# Patient Record
Sex: Female | Born: 1960 | Race: Black or African American | Hispanic: No | Marital: Single | Smoking: Never smoker
Health system: Southern US, Community
[De-identification: ages and names within clinical notes are randomized; demographics above are authoritative.]

## PROBLEM LIST (undated history)

## (undated) DIAGNOSIS — R601 Generalized edema: Secondary | ICD-10-CM

## (undated) DIAGNOSIS — D649 Anemia, unspecified: Secondary | ICD-10-CM

## (undated) DIAGNOSIS — I5032 Chronic diastolic (congestive) heart failure: Secondary | ICD-10-CM

## (undated) DIAGNOSIS — E876 Hypokalemia: Secondary | ICD-10-CM

## (undated) DIAGNOSIS — K436 Other and unspecified ventral hernia with obstruction, without gangrene: Secondary | ICD-10-CM

## (undated) DIAGNOSIS — R0600 Dyspnea, unspecified: Secondary | ICD-10-CM

## (undated) DIAGNOSIS — E119 Type 2 diabetes mellitus without complications: Secondary | ICD-10-CM

## (undated) DIAGNOSIS — K219 Gastro-esophageal reflux disease without esophagitis: Secondary | ICD-10-CM

## (undated) DIAGNOSIS — R600 Localized edema: Secondary | ICD-10-CM

## (undated) DIAGNOSIS — N184 Chronic kidney disease, stage 4 (severe): Secondary | ICD-10-CM

## (undated) DIAGNOSIS — J180 Bronchopneumonia, unspecified organism: Secondary | ICD-10-CM

## (undated) DIAGNOSIS — I10 Essential (primary) hypertension: Secondary | ICD-10-CM

## (undated) DIAGNOSIS — J45909 Unspecified asthma, uncomplicated: Secondary | ICD-10-CM

---

## 2001-07-29 ENCOUNTER — Other Ambulatory Visit: Admission: RE | Admit: 2001-07-29 | Discharge: 2001-07-29 | Payer: Self-pay | Admitting: Family Medicine

## 2004-12-06 ENCOUNTER — Ambulatory Visit: Payer: Self-pay

## 2007-12-08 ENCOUNTER — Emergency Department: Payer: Self-pay | Admitting: Internal Medicine

## 2008-11-09 ENCOUNTER — Emergency Department: Payer: Self-pay | Admitting: Emergency Medicine

## 2008-12-07 ENCOUNTER — Emergency Department: Payer: Self-pay | Admitting: Unknown Physician Specialty

## 2009-02-08 ENCOUNTER — Inpatient Hospital Stay: Payer: Self-pay | Admitting: Internal Medicine

## 2009-02-18 ENCOUNTER — Emergency Department: Payer: Self-pay

## 2009-03-27 ENCOUNTER — Emergency Department: Payer: Self-pay | Admitting: Emergency Medicine

## 2009-07-15 ENCOUNTER — Emergency Department: Payer: Self-pay | Admitting: Emergency Medicine

## 2012-02-03 ENCOUNTER — Emergency Department: Payer: Self-pay | Admitting: *Deleted

## 2013-04-22 ENCOUNTER — Emergency Department: Payer: Self-pay | Admitting: Emergency Medicine

## 2013-04-22 LAB — URINALYSIS, COMPLETE
Bilirubin,UR: NEGATIVE
Glucose,UR: >=500 mg/dL (ref 0–75)
Leukocyte Esterase: NEGATIVE
Nitrite: NEGATIVE
Ph: 6 (ref 4.5–8.0)
RBC,UR: 2 /HPF (ref 0–5)
WBC UR: 18 /HPF (ref 0–5)

## 2013-04-22 LAB — CBC
HCT: 44 % (ref 35.0–47.0)
HGB: 14.6 g/dL (ref 12.0–16.0)
MCH: 29.3 pg (ref 26.0–34.0)
MCV: 88 fL (ref 80–100)
Platelet: 210 10*3/uL (ref 150–440)
RBC: 4.98 10*6/uL (ref 3.80–5.20)
WBC: 7.2 10*3/uL (ref 3.6–11.0)

## 2013-04-22 LAB — BASIC METABOLIC PANEL
Anion Gap: 11 (ref 7–16)
Co2: 22 mmol/L (ref 21–32)
Creatinine: 1.08 mg/dL (ref 0.60–1.30)
EGFR (Non-African Amer.): 59 — ABNORMAL LOW
Glucose: 386 mg/dL — ABNORMAL HIGH (ref 65–99)
Potassium: 3.7 mmol/L (ref 3.5–5.1)
Sodium: 133 mmol/L — ABNORMAL LOW (ref 136–145)

## 2013-04-22 LAB — PRO B NATRIURETIC PEPTIDE: B-Type Natriuretic Peptide: 19 pg/mL (ref 0–125)

## 2015-04-15 ENCOUNTER — Other Ambulatory Visit: Payer: Self-pay | Admitting: Oncology

## 2015-04-15 DIAGNOSIS — Z1231 Encounter for screening mammogram for malignant neoplasm of breast: Secondary | ICD-10-CM

## 2015-05-20 ENCOUNTER — Other Ambulatory Visit: Payer: Self-pay | Admitting: Oncology

## 2015-05-20 DIAGNOSIS — Z1231 Encounter for screening mammogram for malignant neoplasm of breast: Secondary | ICD-10-CM

## 2015-05-23 ENCOUNTER — Ambulatory Visit
Admission: RE | Admit: 2015-05-23 | Discharge: 2015-05-23 | Disposition: A | Discharge status: Home / Self Care | Payer: Self-pay | Source: Ambulatory Visit | Attending: Oncology | Admitting: Oncology

## 2015-05-23 ENCOUNTER — Ambulatory Visit: Payer: Self-pay | Attending: Oncology | Admitting: *Deleted

## 2015-05-23 ENCOUNTER — Encounter: Payer: Self-pay | Admitting: *Deleted

## 2015-05-23 VITALS — BP 144/84 | HR 80 | Temp 96.3°F | Ht 61.81 in | Wt 208.3 lb

## 2015-05-23 DIAGNOSIS — Z1231 Encounter for screening mammogram for malignant neoplasm of breast: Secondary | ICD-10-CM | POA: Insufficient documentation

## 2015-05-23 DIAGNOSIS — Z0000 Encounter for general adult medical examination without abnormal findings: Secondary | ICD-10-CM

## 2015-05-23 NOTE — Progress Notes (Signed)
Subjective:     Patient ID: Brianna [name], female   DOB: 08/26/1961, 54 y.o.   MRN: [id number]  HPI   Review of Systems     Objective:   Physical Exam  Pulmonary/Chest: Right breast exhibits no inverted nipple, no mass, no nipple discharge, no skin change and no tenderness. Left breast exhibits no inverted nipple, no mass, no nipple discharge, no skin change and no tenderness. Breasts are symmetrical.         Assessment:    54 year old Black female presents to [facility] for clinical breast exam and mammogram.  Patient is unsure as to the date of her last pap smear.  She thinks it was 2 years ago.  She is to check with her PCP and call me back to schedule an appointment for a pap if it has been 3 or more years since the last one.  Clinical breast exam unremarkable.  Blood pressure elevated at 144/84.  She is to recheck her blood pressure at [facility] or [facility], and if remains higher than 140/90 she is to follow-up with her primary care provider.  Hand out on hypertention given to patient.Patient is eligible for [facility]CP. She has no insurance, Medicare, or Medicaid and meets financial eligibility.  Hand-out given on the afforadable care act.     Plan:     Screening mammogram ordered.

## 2015-05-24 NOTE — Progress Notes (Signed)
Letter mailed from the [facility] to inform patient of her normal mammogram results.  Patient is to follow-up with annual screening in one year. 

## 2015-06-29 ENCOUNTER — Ambulatory Visit: Payer: Self-pay | Attending: Oncology | Admitting: *Deleted

## 2015-06-29 ENCOUNTER — Encounter: Payer: Self-pay | Admitting: *Deleted

## 2015-06-29 VITALS — BP 138/82 | HR 81 | Temp 98.1°F | Resp 20 | Ht <= 58 in | Wt 209.8 lb

## 2015-06-29 DIAGNOSIS — Z0000 Encounter for general adult medical examination without abnormal findings: Secondary | ICD-10-CM

## 2015-06-29 NOTE — Progress Notes (Signed)
Subjective:     Patient ID: Brianna [name], female   DOB: [birth year], 54 y.o.   MRN: [id number]  HPI   Review of Systems     Objective:   Physical Exam  Genitourinary: There is no rash, tenderness, lesion or injury on the right labia. There is no rash, tenderness, lesion or injury on the left labia. No erythema, tenderness or bleeding in the vagina. No foreign body around the vagina. No signs of injury around the vagina. Vaginal discharge found.       Assessment:     Patient returns today for pap smear only. States she does not remember exactly when the last one was, but "I know I need one."  No pap on file with BCCCP.  Specimen collected for pap smear.  Mildly odorous tan vaginal discharge noted.  Informed of her normal mammogram results from a couple of weeks ago.     Plan:     Will follow-up per BCCCP protocol.

## 2015-07-01 LAB — PAP LB AND HPV HIGH-RISK
HPV, HIGH-RISK: NEGATIVE
PAP Smear Comment: 0

## 2015-07-06 ENCOUNTER — Encounter: Payer: Self-pay | Admitting: *Deleted

## 2015-07-06 NOTE — Progress Notes (Signed)
Mailed patient a letter informing her of her normal pap smear and negative HPV.  Next pap due in 5 years per protocol.  [facility] to [place].

## 2016-07-25 ENCOUNTER — Ambulatory Visit
Admission: RE | Admit: 2016-07-25 | Discharge: 2016-07-25 | Disposition: A | Discharge status: Home / Self Care | Payer: Self-pay | Source: Ambulatory Visit | Attending: Internal Medicine | Admitting: Internal Medicine

## 2016-07-25 ENCOUNTER — Ambulatory Visit: Payer: Self-pay | Attending: Internal Medicine

## 2016-07-25 VITALS — BP 121/81 | HR 78 | Temp 98.0°F | Ht <= 58 in | Wt 214.4 lb

## 2016-07-25 DIAGNOSIS — Z0000 Encounter for general adult medical examination without abnormal findings: Secondary | ICD-10-CM

## 2016-07-25 NOTE — Progress Notes (Signed)
Subjective:     Patient ID: Brianna [name], female   DOB: October 08, 1961, 55 y.o.   MRN: [id number]  HPI   Review of Systems     Objective:   Physical Exam  Pulmonary/Chest: Right breast exhibits no inverted nipple, no mass, no nipple discharge, no skin change and no tenderness. Left breast exhibits no inverted nipple, no mass, no nipple discharge, no skin change and no tenderness. Breasts are symmetrical.       Assessment:     55 year old patient presents for [facility] clinic visit.  Patient screened, and meets BCCCP eligibility.  Patient does not have insurance, Medicare or Medicaid.  Handout given on Affordable Care Act.  Instructed patient on breast self-exam using teach back method. CBE unremarkable.  No mass or lump palpated.    Plan:     Sent for bilateral screening mammogram.

## 2016-08-15 NOTE — Progress Notes (Signed)
Letter mailed from [facility] to notify of normal mammogram results.  Patient to return in one year for annual screening.  Copy to [facility]. 

## 2017-07-17 ENCOUNTER — Ambulatory Visit: Payer: Self-pay | Attending: Oncology

## 2017-07-17 ENCOUNTER — Ambulatory Visit
Admission: RE | Admit: 2017-07-17 | Discharge: 2017-07-17 | Disposition: A | Discharge status: Home / Self Care | Payer: Self-pay | Source: Ambulatory Visit | Attending: Oncology | Admitting: Oncology

## 2017-07-17 VITALS — BP 119/77 | HR 83 | Temp 97.9°F | Resp 20 | Ht <= 58 in | Wt 216.0 lb

## 2017-07-17 DIAGNOSIS — Z0000 Encounter for general adult medical examination without abnormal findings: Secondary | ICD-10-CM

## 2017-07-17 NOTE — Progress Notes (Signed)
Subjective:     Patient ID: Brianna [name], female   DOB: June 12, 1961, 56 y.o.   MRN: [id number]  HPI   Review of Systems     Objective:   Physical Exam  Pulmonary/Chest: Right breast exhibits no inverted nipple, no mass, no nipple discharge, no skin change and no tenderness. Left breast exhibits no inverted nipple, no mass, no nipple discharge, no skin change and no tenderness. Breasts are symmetrical.  Large pendulous breasts       Assessment:     56 year old patient presents for [place] clinic visit.  Patient screened, and meets BCCCP eligibility.  Patient does not have insurance, Medicare or Medicaid.  Handout given on Affordable Care Act.   Wardell Honour RN instructed patient on breast self-exam using teach back method. CBE unremarkable.  No mass or lump palpated.    Plan:     Sent for bilateral screening mammogram.

## 2017-08-30 NOTE — Progress Notes (Unsigned)
Letter mailed from [facility] to notify of normal mammogram results.  Patient to return in one year for annual screening.  Copy to [facility]. 

## 2020-02-09 ENCOUNTER — Inpatient Hospital Stay: Payer: Self-pay | Admitting: Certified Registered Nurse Anesthetist

## 2020-02-09 ENCOUNTER — Other Ambulatory Visit: Payer: Self-pay

## 2020-02-09 ENCOUNTER — Inpatient Hospital Stay
Admission: EM | Admit: 2020-02-09 | Discharge: 2020-02-14 | DRG: 329 | Disposition: A | Discharge status: Home / Self Care | Payer: Self-pay | Attending: Surgery | Admitting: Surgery

## 2020-02-09 ENCOUNTER — Inpatient Hospital Stay: Payer: Self-pay

## 2020-02-09 ENCOUNTER — Encounter: Payer: Self-pay | Admitting: Intensive Care

## 2020-02-09 ENCOUNTER — Emergency Department: Payer: Self-pay

## 2020-02-09 ENCOUNTER — Encounter
Admission: EM | Disposition: A | Discharge status: Home / Self Care | Payer: Self-pay | Source: Home / Self Care | Attending: Surgery

## 2020-02-09 DIAGNOSIS — Z79899 Other long term (current) drug therapy: Secondary | ICD-10-CM

## 2020-02-09 DIAGNOSIS — K219 Gastro-esophageal reflux disease without esophagitis: Secondary | ICD-10-CM | POA: Diagnosis present

## 2020-02-09 DIAGNOSIS — Z794 Long term (current) use of insulin: Secondary | ICD-10-CM

## 2020-02-09 DIAGNOSIS — N1832 Chronic kidney disease, stage 3b: Secondary | ICD-10-CM | POA: Diagnosis present

## 2020-02-09 DIAGNOSIS — Z20822 Contact with and (suspected) exposure to covid-19: Secondary | ICD-10-CM | POA: Diagnosis present

## 2020-02-09 DIAGNOSIS — I10 Essential (primary) hypertension: Secondary | ICD-10-CM

## 2020-02-09 DIAGNOSIS — Z7982 Long term (current) use of aspirin: Secondary | ICD-10-CM

## 2020-02-09 DIAGNOSIS — Z6841 Body Mass Index (BMI) 40.0 and over, adult: Secondary | ICD-10-CM

## 2020-02-09 DIAGNOSIS — R0602 Shortness of breath: Secondary | ICD-10-CM

## 2020-02-09 DIAGNOSIS — N170 Acute kidney failure with tubular necrosis: Secondary | ICD-10-CM | POA: Diagnosis present

## 2020-02-09 DIAGNOSIS — K420 Umbilical hernia with obstruction, without gangrene: Secondary | ICD-10-CM

## 2020-02-09 DIAGNOSIS — J45909 Unspecified asthma, uncomplicated: Secondary | ICD-10-CM | POA: Diagnosis present

## 2020-02-09 DIAGNOSIS — K436 Other and unspecified ventral hernia with obstruction, without gangrene: Principal | ICD-10-CM | POA: Diagnosis present

## 2020-02-09 DIAGNOSIS — Z23 Encounter for immunization: Secondary | ICD-10-CM

## 2020-02-09 DIAGNOSIS — I129 Hypertensive chronic kidney disease with stage 1 through stage 4 chronic kidney disease, or unspecified chronic kidney disease: Secondary | ICD-10-CM | POA: Diagnosis present

## 2020-02-09 DIAGNOSIS — N184 Chronic kidney disease, stage 4 (severe): Secondary | ICD-10-CM

## 2020-02-09 DIAGNOSIS — E1122 Type 2 diabetes mellitus with diabetic chronic kidney disease: Secondary | ICD-10-CM | POA: Diagnosis present

## 2020-02-09 DIAGNOSIS — D631 Anemia in chronic kidney disease: Secondary | ICD-10-CM | POA: Diagnosis present

## 2020-02-09 DIAGNOSIS — Z888 Allergy status to other drugs, medicaments and biological substances status: Secondary | ICD-10-CM

## 2020-02-09 DIAGNOSIS — E119 Type 2 diabetes mellitus without complications: Secondary | ICD-10-CM

## 2020-02-09 HISTORY — PX: BOWEL RESECTION: SHX1257

## 2020-02-09 HISTORY — DX: Essential (primary) hypertension: I10

## 2020-02-09 HISTORY — DX: Gastro-esophageal reflux disease without esophagitis: K21.9

## 2020-02-09 HISTORY — DX: Unspecified asthma, uncomplicated: J45.909

## 2020-02-09 HISTORY — DX: Type 2 diabetes mellitus without complications: E11.9

## 2020-02-09 HISTORY — PX: LAPAROTOMY: SHX154

## 2020-02-09 LAB — CBC WITH DIFFERENTIAL/PLATELET
Abs Immature Granulocytes: 0.31 10*3/uL — ABNORMAL HIGH (ref 0.00–0.07)
Basophils Absolute: 0 10*3/uL (ref 0.0–0.1)
Basophils Relative: 0 %
Eosinophils Absolute: 0 10*3/uL (ref 0.0–0.5)
Eosinophils Relative: 0 %
HCT: 41.9 % (ref 36.0–46.0)
Hemoglobin: 13.9 g/dL (ref 12.0–15.0)
Immature Granulocytes: 1 %
Lymphocytes Relative: 10 %
Lymphs Abs: 2.7 10*3/uL (ref 0.7–4.0)
MCH: 29.6 pg (ref 26.0–34.0)
MCHC: 33.2 g/dL (ref 30.0–36.0)
MCV: 89.3 fL (ref 80.0–100.0)
Monocytes Absolute: 2.1 10*3/uL — ABNORMAL HIGH (ref 0.1–1.0)
Monocytes Relative: 8 %
Neutro Abs: 21 10*3/uL — ABNORMAL HIGH (ref 1.7–7.7)
Neutrophils Relative %: 81 %
Platelets: 419 10*3/uL — ABNORMAL HIGH (ref 150–400)
RBC: 4.69 MIL/uL (ref 3.87–5.11)
RDW: 14.6 % (ref 11.5–15.5)
Smear Review: UNDETERMINED
WBC: 26.1 10*3/uL — ABNORMAL HIGH (ref 4.0–10.5)
nRBC: 0 % (ref 0.0–0.2)

## 2020-02-09 LAB — COMPREHENSIVE METABOLIC PANEL
ALT: 17 U/L (ref 0–44)
AST: 19 U/L (ref 15–41)
Albumin: 3.3 g/dL — ABNORMAL LOW (ref 3.5–5.0)
Alkaline Phosphatase: 111 U/L (ref 38–126)
Anion gap: 23 — ABNORMAL HIGH (ref 5–15)
BUN: 74 mg/dL — ABNORMAL HIGH (ref 6–20)
CO2: 24 mmol/L (ref 22–32)
Calcium: 9.9 mg/dL (ref 8.9–10.3)
Chloride: 90 mmol/L — ABNORMAL LOW (ref 98–111)
Creatinine, Ser: 6.44 mg/dL — ABNORMAL HIGH (ref 0.44–1.00)
GFR calc Af Amer: 8 mL/min — ABNORMAL LOW (ref >60)
GFR calc non Af Amer: 7 mL/min — ABNORMAL LOW (ref >60)
Glucose, Bld: 274 mg/dL — ABNORMAL HIGH (ref 70–99)
Potassium: 3.4 mmol/L — ABNORMAL LOW (ref 3.5–5.1)
Sodium: 137 mmol/L (ref 135–145)
Total Bilirubin: 0.9 mg/dL (ref 0.3–1.2)
Total Protein: 9.1 g/dL — ABNORMAL HIGH (ref 6.5–8.1)

## 2020-02-09 LAB — TROPONIN I (HIGH SENSITIVITY): Troponin I (High Sensitivity): 53 ng/L — ABNORMAL HIGH (ref <18)

## 2020-02-09 LAB — HIV ANTIBODY (ROUTINE TESTING W REFLEX): HIV Screen 4th Generation wRfx: NONREACTIVE

## 2020-02-09 LAB — GLUCOSE, CAPILLARY
Glucose-Capillary: 224 mg/dL — ABNORMAL HIGH (ref 70–99)
Glucose-Capillary: 144 mg/dL — ABNORMAL HIGH (ref 70–99)
Glucose-Capillary: 142 mg/dL — ABNORMAL HIGH (ref 70–99)
Glucose-Capillary: 140 mg/dL — ABNORMAL HIGH (ref 70–99)

## 2020-02-09 LAB — LIPASE, BLOOD: Lipase: 19 U/L (ref 11–51)

## 2020-02-09 LAB — RESPIRATORY PANEL BY RT PCR (FLU A&B, COVID)
Influenza A by PCR: NEGATIVE
Influenza B by PCR: NEGATIVE
SARS Coronavirus 2 by RT PCR: NEGATIVE

## 2020-02-09 LAB — LACTIC ACID, PLASMA: Lactic Acid, Venous: 1.7 mmol/L (ref 0.5–1.9)

## 2020-02-09 SURGERY — LAPAROTOMY, EXPLORATORY
Anesthesia: General

## 2020-02-09 MED ORDER — ROCURONIUM BROMIDE 100 MG/10ML IV SOLN
INTRAVENOUS | Status: DC | PRN
  Administered 2020-02-09: 30 mg via INTRAVENOUS
  Administered 2020-02-09: 20 mg via INTRAVENOUS

## 2020-02-09 MED ORDER — MORPHINE SULFATE (PF) 4 MG/ML IV SOLN
4.0000 mg | Freq: Once | INTRAVENOUS | Status: AC
  Administered 2020-02-09: 10:00:00 4 mg via INTRAVENOUS
  Filled 2020-02-09: qty 1

## 2020-02-09 MED ORDER — SODIUM CHLORIDE 0.9 % IV SOLN: INTRAVENOUS | Status: DC | PRN

## 2020-02-09 MED ORDER — METOPROLOL TARTRATE 5 MG/5ML IV SOLN
5.0000 mg | Freq: Four times a day (QID) | INTRAVENOUS | Status: DC
  Administered 2020-02-10 (×3): 5 mg via INTRAVENOUS
  Filled 2020-02-09 (×3): qty 5

## 2020-02-09 MED ORDER — METOPROLOL TARTRATE 5 MG/5ML IV SOLN
INTRAVENOUS | Status: DC | PRN
  Administered 2020-02-09: 3 mg via INTRAVENOUS

## 2020-02-09 MED ORDER — ALBUTEROL SULFATE (2.5 MG/3ML) 0.083% IN NEBU: 2.5000 mg | INHALATION_SOLUTION | RESPIRATORY_TRACT | Status: DC | PRN

## 2020-02-09 MED ORDER — SODIUM CHLORIDE 0.9 % IV SOLN: INTRAVENOUS | Status: DC

## 2020-02-09 MED ORDER — LIDOCAINE IN D5W 4-5 MG/ML-% IV SOLN
INTRAVENOUS | Status: DC | PRN
  Administered 2020-02-09: 25 ug/kg/min via INTRAVENOUS

## 2020-02-09 MED ORDER — INSULIN ASPART 100 UNIT/ML ~~LOC~~ SOLN
10.0000 [IU] | Freq: Once | SUBCUTANEOUS | Status: AC
  Administered 2020-02-09: 10 [IU] via SUBCUTANEOUS

## 2020-02-09 MED ORDER — MIDAZOLAM HCL 2 MG/2ML IJ SOLN
INTRAMUSCULAR | Status: DC | PRN
  Administered 2020-02-09: 1 mg via INTRAVENOUS

## 2020-02-09 MED ORDER — LIDOCAINE HCL (CARDIAC) PF 100 MG/5ML IV SOSY
PREFILLED_SYRINGE | INTRAVENOUS | Status: DC | PRN
  Administered 2020-02-09: 60 mg via INTRAVENOUS

## 2020-02-09 MED ORDER — ROCURONIUM BROMIDE 50 MG/5ML IV SOLN
INTRAVENOUS | Status: AC
  Filled 2020-02-09: qty 1

## 2020-02-09 MED ORDER — SODIUM CHLORIDE 0.9 % IV SOLN
INTRAVENOUS | Status: DC | PRN
  Administered 2020-02-09: 60 mL

## 2020-02-09 MED ORDER — GLYCOPYRROLATE 0.2 MG/ML IJ SOLN
INTRAMUSCULAR | Status: DC | PRN
  Administered 2020-02-09: .6 mg via INTRAVENOUS

## 2020-02-09 MED ORDER — SODIUM CHLORIDE 0.9 % IV SOLN: Freq: Once | INTRAVENOUS | Status: DC

## 2020-02-09 MED ORDER — ONDANSETRON HCL 4 MG/2ML IJ SOLN
INTRAMUSCULAR | Status: DC | PRN
  Administered 2020-02-09: 4 mg via INTRAVENOUS

## 2020-02-09 MED ORDER — ONDANSETRON HCL 4 MG/2ML IJ SOLN: 4.0000 mg | Freq: Four times a day (QID) | INTRAMUSCULAR | Status: DC | PRN

## 2020-02-09 MED ORDER — SODIUM CHLORIDE 0.9 % IV BOLUS: 1000.0000 mL | Freq: Once | INTRAVENOUS | Status: DC

## 2020-02-09 MED ORDER — IPRATROPIUM-ALBUTEROL 0.5-2.5 (3) MG/3ML IN SOLN: 3.0000 mL | Freq: Once | RESPIRATORY_TRACT | Status: AC

## 2020-02-09 MED ORDER — SODIUM CHLORIDE 0.9 % IV BOLUS
1000.0000 mL | Freq: Once | INTRAVENOUS | Status: AC
  Administered 2020-02-09: 1000 mL via INTRAVENOUS

## 2020-02-09 MED ORDER — INFLUENZA VAC SPLIT QUAD 0.5 ML IM SUSY
0.5000 mL | PREFILLED_SYRINGE | INTRAMUSCULAR | Status: AC
  Administered 2020-02-10: 09:00:00 0.5 mL via INTRAMUSCULAR
  Filled 2020-02-09: qty 0.5

## 2020-02-09 MED ORDER — PANTOPRAZOLE SODIUM 40 MG IV SOLR
40.0000 mg | Freq: Every day | INTRAVENOUS | Status: DC
  Administered 2020-02-09 – 2020-02-12 (×4): 40 mg via INTRAVENOUS
  Filled 2020-02-09 (×4): qty 40

## 2020-02-09 MED ORDER — INSULIN ASPART 100 UNIT/ML ~~LOC~~ SOLN: 0.0000 [IU] | Freq: Every day | SUBCUTANEOUS | Status: DC

## 2020-02-09 MED ORDER — ALBUTEROL SULFATE HFA 108 (90 BASE) MCG/ACT IN AERS: 2.0000 | INHALATION_SPRAY | Freq: Four times a day (QID) | RESPIRATORY_TRACT | Status: DC | PRN

## 2020-02-09 MED ORDER — ONDANSETRON HCL 4 MG/2ML IJ SOLN
INTRAMUSCULAR | Status: AC
  Filled 2020-02-09: qty 2

## 2020-02-09 MED ORDER — INSULIN ASPART 100 UNIT/ML ~~LOC~~ SOLN: 6.0000 [IU] | Freq: Three times a day (TID) | SUBCUTANEOUS | Status: DC

## 2020-02-09 MED ORDER — PROPOFOL 10 MG/ML IV BOLUS
INTRAVENOUS | Status: AC
  Filled 2020-02-09: qty 20

## 2020-02-09 MED ORDER — ONDANSETRON 4 MG PO TBDP: 4.0000 mg | ORAL_TABLET | Freq: Four times a day (QID) | ORAL | Status: DC | PRN

## 2020-02-09 MED ORDER — KETAMINE HCL 50 MG/ML IJ SOLN
INTRAMUSCULAR | Status: AC
  Filled 2020-02-09: qty 10

## 2020-02-09 MED ORDER — NEOSTIGMINE METHYLSULFATE 10 MG/10ML IV SOLN
INTRAVENOUS | Status: DC | PRN
  Administered 2020-02-09: 1 mg via INTRAVENOUS
  Administered 2020-02-09: 4 mg via INTRAVENOUS

## 2020-02-09 MED ORDER — HYDROMORPHONE HCL 1 MG/ML IJ SOLN
0.5000 mg | INTRAMUSCULAR | Status: DC | PRN
  Administered 2020-02-09 – 2020-02-13 (×12): 0.5 mg via INTRAVENOUS
  Filled 2020-02-09 (×12): qty 0.5

## 2020-02-09 MED ORDER — METOPROLOL TARTRATE 5 MG/5ML IV SOLN: 5.0000 mg | Freq: Four times a day (QID) | INTRAVENOUS | Status: DC | PRN

## 2020-02-09 MED ORDER — FENTANYL CITRATE (PF) 100 MCG/2ML IJ SOLN
INTRAMUSCULAR | Status: AC
  Filled 2020-02-09: qty 2

## 2020-02-09 MED ORDER — HYDRALAZINE HCL 20 MG/ML IJ SOLN
10.0000 mg | INTRAMUSCULAR | Status: DC | PRN
  Administered 2020-02-13: 06:00:00 10 mg via INTRAVENOUS
  Filled 2020-02-09: qty 1

## 2020-02-09 MED ORDER — KETAMINE HCL 50 MG/ML IJ SOLN
INTRAMUSCULAR | Status: DC | PRN
  Administered 2020-02-09: 10 mg via INTRAMUSCULAR
  Administered 2020-02-09: 50 mg via INTRAMUSCULAR

## 2020-02-09 MED ORDER — PIPERACILLIN-TAZOBACTAM 3.375 G IVPB 30 MIN
3.3750 g | Freq: Once | INTRAVENOUS | Status: AC
  Administered 2020-02-09: 3.375 g via INTRAVENOUS
  Filled 2020-02-09: qty 50

## 2020-02-09 MED ORDER — FENTANYL CITRATE (PF) 100 MCG/2ML IJ SOLN
INTRAMUSCULAR | Status: DC | PRN
  Administered 2020-02-09 (×2): 50 ug via INTRAVENOUS

## 2020-02-09 MED ORDER — ACETAMINOPHEN 10 MG/ML IV SOLN
1000.0000 mg | Freq: Four times a day (QID) | INTRAVENOUS | Status: AC
  Administered 2020-02-09 – 2020-02-10 (×4): 1000 mg via INTRAVENOUS
  Filled 2020-02-09 (×4): qty 100

## 2020-02-09 MED ORDER — INSULIN ASPART 100 UNIT/ML ~~LOC~~ SOLN
0.0000 [IU] | SUBCUTANEOUS | Status: DC
  Administered 2020-02-09 – 2020-02-10 (×3): 3 [IU] via SUBCUTANEOUS
  Filled 2020-02-09 (×3): qty 1

## 2020-02-09 MED ORDER — INSULIN ASPART 100 UNIT/ML ~~LOC~~ SOLN
SUBCUTANEOUS | Status: AC
  Filled 2020-02-09: qty 1

## 2020-02-09 MED ORDER — DEXAMETHASONE SODIUM PHOSPHATE 10 MG/ML IJ SOLN
INTRAMUSCULAR | Status: DC | PRN
  Administered 2020-02-09: 4 mg via INTRAVENOUS

## 2020-02-09 MED ORDER — ONDANSETRON HCL 4 MG/2ML IJ SOLN
4.0000 mg | Freq: Once | INTRAMUSCULAR | Status: AC
  Administered 2020-02-09: 10:00:00 4 mg via INTRAVENOUS
  Filled 2020-02-09: qty 2

## 2020-02-09 MED ORDER — MORPHINE SULFATE (PF) 2 MG/ML IV SOLN: 2.0000 mg | INTRAVENOUS | Status: DC | PRN

## 2020-02-09 MED ORDER — IPRATROPIUM-ALBUTEROL 0.5-2.5 (3) MG/3ML IN SOLN
RESPIRATORY_TRACT | Status: AC
  Administered 2020-02-09: 12:00:00 3 mL via RESPIRATORY_TRACT
  Filled 2020-02-09: qty 3

## 2020-02-09 MED ORDER — SUCCINYLCHOLINE CHLORIDE 20 MG/ML IJ SOLN
INTRAMUSCULAR | Status: DC | PRN
  Administered 2020-02-09: 100 mg via INTRAVENOUS

## 2020-02-09 MED ORDER — ACETAMINOPHEN 10 MG/ML IV SOLN
INTRAVENOUS | Status: DC | PRN
  Administered 2020-02-09 (×2): 1000 mg via INTRAVENOUS

## 2020-02-09 MED ORDER — MIDAZOLAM HCL 2 MG/2ML IJ SOLN
INTRAMUSCULAR | Status: AC
  Filled 2020-02-09: qty 2

## 2020-02-09 MED ORDER — PHENYLEPHRINE HCL (PRESSORS) 10 MG/ML IV SOLN
INTRAVENOUS | Status: DC | PRN
  Administered 2020-02-09: 200 ug via INTRAVENOUS
  Administered 2020-02-09 (×4): 100 ug via INTRAVENOUS

## 2020-02-09 MED ORDER — PIPERACILLIN-TAZOBACTAM 3.375 G IVPB: 3.3750 g | Freq: Three times a day (TID) | INTRAVENOUS | Status: DC

## 2020-02-09 MED ORDER — PROPOFOL 10 MG/ML IV BOLUS
INTRAVENOUS | Status: DC | PRN
  Administered 2020-02-09: 100 mg via INTRAVENOUS

## 2020-02-09 MED ORDER — EPHEDRINE SULFATE 50 MG/ML IJ SOLN
INTRAMUSCULAR | Status: DC | PRN
  Administered 2020-02-09 (×3): 10 mg via INTRAVENOUS

## 2020-02-09 MED ORDER — METOPROLOL TARTRATE 5 MG/5ML IV SOLN
INTRAVENOUS | Status: AC
  Filled 2020-02-09: qty 5

## 2020-02-09 MED ORDER — PIPERACILLIN-TAZOBACTAM 3.375 G IVPB
3.3750 g | Freq: Two times a day (BID) | INTRAVENOUS | Status: DC
  Administered 2020-02-09 – 2020-02-12 (×7): 3.375 g via INTRAVENOUS
  Filled 2020-02-09 (×7): qty 50

## 2020-02-09 MED ORDER — INSULIN ASPART 100 UNIT/ML ~~LOC~~ SOLN: 0.0000 [IU] | Freq: Three times a day (TID) | SUBCUTANEOUS | Status: DC

## 2020-02-09 SURGICAL SUPPLY — 39 items
APPLICATOR CHLORAPREP 10.5 ORG (MISCELLANEOUS) ×4 IMPLANT
CANISTER SUCT 1200ML W/VALVE (MISCELLANEOUS) ×4 IMPLANT
COVER WAND RF STERILE (DRAPES) ×4 IMPLANT
DRAIN CHANNEL JP 19F (MISCELLANEOUS) ×4 IMPLANT
DRAPE LAPAROTOMY 100X77 ABD (DRAPES) ×4 IMPLANT
DRSG OPSITE POSTOP 4X10 (GAUZE/BANDAGES/DRESSINGS) ×4 IMPLANT
DRSG OPSITE POSTOP 4X12 (GAUZE/BANDAGES/DRESSINGS) ×4 IMPLANT
DRSG TEGADERM 4X10 (GAUZE/BANDAGES/DRESSINGS) ×4 IMPLANT
DRSG TEGADERM 4X4.75 (GAUZE/BANDAGES/DRESSINGS) ×4 IMPLANT
ELECT CAUTERY BLADE 6.4 (BLADE) ×4 IMPLANT
ELECT REM PT RETURN 9FT ADLT (ELECTROSURGICAL) ×4
ELECTRODE REM PT RTRN 9FT ADLT (ELECTROSURGICAL) ×2 IMPLANT
GAUZE SPONGE 4X4 12PLY STRL (GAUZE/BANDAGES/DRESSINGS) ×8 IMPLANT
GLOVE SURG SYN 7.0 (GLOVE) ×8 IMPLANT
GLOVE SURG SYN 7.5  E (GLOVE) ×4
GLOVE SURG SYN 7.5 E (GLOVE) ×4 IMPLANT
GOWN STRL REUS W/ TWL LRG LVL3 (GOWN DISPOSABLE) ×8 IMPLANT
GOWN STRL REUS W/TWL LRG LVL3 (GOWN DISPOSABLE) ×8
LABEL OR SOLS (LABEL) ×4 IMPLANT
LIGASURE IMPACT 36 18CM CVD LR (INSTRUMENTS) ×4 IMPLANT
NEEDLE HYPO 22GX1.5 SAFETY (NEEDLE) ×4 IMPLANT
NS IRRIG 1000ML POUR BTL (IV SOLUTION) ×4 IMPLANT
PACK BASIN MAJOR ARMC (MISCELLANEOUS) ×4 IMPLANT
PACK COLON CLEAN CLOSURE (MISCELLANEOUS) IMPLANT
RELOAD PROXIMATE 75MM BLUE (ENDOMECHANICALS) ×12 IMPLANT
SEPRAFILM MEMBRANE 5X6 (MISCELLANEOUS) IMPLANT
STAPLER PROXIMATE 75MM BLUE (STAPLE) ×4 IMPLANT
STAPLER SKIN PROX 35W (STAPLE) ×4 IMPLANT
SUT ETHILON 3-0 FS-10 30 BLK (SUTURE) ×4
SUT PDS AB 1 CT1 36 (SUTURE) ×8 IMPLANT
SUT PROLENE 0 CT 1 30 (SUTURE) IMPLANT
SUT SILK 2 0 (SUTURE) ×2
SUT SILK 2-0 18XBRD TIE 12 (SUTURE) ×2 IMPLANT
SUT SILK 3-0 (SUTURE) ×8 IMPLANT
SUT VIC AB 3-0 SH 27 (SUTURE) ×4
SUT VIC AB 3-0 SH 27X BRD (SUTURE) ×4 IMPLANT
SUTURE EHLN 3-0 FS-10 30 BLK (SUTURE) ×2 IMPLANT
SYR 10ML LL (SYRINGE) ×4 IMPLANT
TRAY FOLEY MTR SLVR 16FR STAT (SET/KITS/TRAYS/PACK) ×4 IMPLANT

## 2020-02-09 NOTE — Op Note (Signed)
Procedure Date:  02/09/2020  Pre-operative Diagnosis:  Strangulated ventral hernia  Post-operative Diagnosis:  Strangulated ventral hernia  Procedure:  Strangulated ventral hernia repair, small bowel resection, debridement and excision of skin and subcutaneous tissue of 60 sqcm.  Surgeon:  Melvyn Neth, MD  Assistants:  Caroleen Hamman, MD.  His assistance was needed due to the complexity of the case, the bowel resection, and hernia repair.  Also assisting was Edison Simon, PA-C  Anesthesia:  General endotracheal  Estimated Blood Loss:  100 ml  Specimens:  Umbilicus skin and subcutaneous tissue, necrotic small bowel segment  Complications:  None  Indications for Procedure:  This is a 59 y.o. female who presents with strangulated ventral hernia causing small bowel obstruction.  The risks of bleeding, abscess or infection, injury to surrounding structures, and need for further procedures were all discussed with the patient and was willing to proceed.  Description of Procedure: The patient was correctly identified in the preoperative area and brought into the operating room.  The patient was placed supine with VTE prophylaxis in place.  Appropriate time-outs were performed.  Anesthesia was induced and the patient was intubated.  Foley catheter was placed.  Appropriate antibiotics were infused.  The abdomen was prepped and draped in a sterile fashion.  A midline incision was made and electrocautery was used to dissect down the subcutaneous tissue to the fascia superior to the ventral hernia.  The fascia was incised and extended superiorly and inferiorly down to the umbilicus.  The patient was noted to have a strangulated ventral hernia involving the umbilicus.  We traced the small bowel to the segment that was strangulated.  A window was created in the mesentery of the small bowel proximal and distal to the hernia defect, and a blue load 75 mm stapler was used to resect the small intestine.   LigaSure was used to take the mesentery along the bowel being resected.  After this was done, then we opened the strangulated hernia defect and reduced the small bowel segment that had been resected.  This was necrotic but had not perforated.  There was a small section of omentum that was also strangulated and this was resected with the LigaSure as well.  After this, we further mobilized the bowel and omentum and noticed a second ventral hernia, which contained omentum.  This was easily reduced and was not necrotic or compromised.  The umbilical skin and subcutaneous tissue were resected, as the umbilical skin had overlying changes and was ischemic.  This was resected down to the fascia.  The fascial defect was then extended inferiorly to better evaluate the entire abdomen.  The small bowel contents were milked back to the stomach, and suctioned via NG tube which location was confirmed by palpation to be in the stomach.  About 2.5 L of fluid was suctioned. After this, the small bowel ends were anastomosed in side to side fashion using the GIA blue load stapler, and the common channel was closed with another blue load.  The intersecting staple corners were imbricated using 3-0 Silk suture, and the mesenteric defect was closed with 3-0 Silk as well.  Following that, no other defects were noted.  The abdominal cavity was irrigated and suctioned.  60 ml of Exparel solution mixed with 0.5% bupivacaine and epi was infiltrated onto the peritoneum, fascia, and subcutaneous tissue.  The fascia was then closed using #1 PDS suture x 2.  The subcutaneous space was irrigated, and a 19 Fr. Blake drain was  placed in the subcutaneous space through a LUQ skin entry point and secured in place with 3-0 Nylon.  The dermis was approximated using 3-0 Vicryl, and the skin was closed using staples.  The wound was dressed with Honeycomb dressing and the drains with 4x4 gauze and tegaderm.   The patient was emerged from anesthesia and  extubated and brought to the recovery room for further management.  The patient tolerated the procedure well and all counts were correct at the end of the case.   Melvyn Neth, MD

## 2020-02-09 NOTE — Consult Note (Signed)
Pharmacy Antibiotic Note  Brianna [name] is a 59 y.o. female admitted on 02/09/2020 with intra-abdominal infection.  Pharmacy has been consulted for Zosyn. dosing.  Plan: Zosyn 3.375g IV q12h (4 hour infusion). - renally adjusted -   Height: 4\' 9"  (144.8 cm) Weight: 216 lb (98 kg) IBW/kg (Calculated) : 38.6  Temp (24hrs), Avg:97.9 F (36.6 C), Min:97.2 F (36.2 C), Max:98.5 F (36.9 C)  Recent Labs  Lab 02/09/20 0914 02/09/20 0931  WBC 26.1*  --   CREATININE 6.44*  --   LATICACIDVEN  --  1.7    Estimated Creatinine Clearance: 9.4 mL/min (A) (by C-G formula based on SCr of 6.44 mg/dL (H)).    Allergies  Allergen Reactions  . Ace Inhibitors Anaphylaxis  . Lisinopril Anaphylaxis  . Lipitor [Atorvastatin] Other (See Comments)    Muscle aches    Antimicrobials this admission: Zosyn 2/9 >>   Dose adjustments this admission: none  Microbiology results: Flu/COVID NEG  Thank you for allowing pharmacy to be a part of this patient's care.  Lu Duffel, PharmD, BCPS Clinical Pharmacist 02/09/2020 3:47 PM

## 2020-02-09 NOTE — Anesthesia Procedure Notes (Signed)
Procedure Name: Intubation Date/Time: 02/09/2020 12:33 PM Performed by: Jonna Clark, CRNA Pre-anesthesia Checklist: Patient identified, Patient being monitored, Timeout performed, Emergency Drugs available and Suction available Patient Re-evaluated:Patient Re-evaluated prior to induction Oxygen Delivery Method: Circle system utilized Preoxygenation: Pre-oxygenation with 100% oxygen Induction Type: IV induction Ventilation: Mask ventilation without difficulty Laryngoscope Size: 3 and McGraph Grade View: Grade I Tube type: Oral Tube size: 7.0 mm Number of attempts: 1 Airway Equipment and Method: Stylet Placement Confirmation: ETT inserted through vocal cords under direct vision,  positive ETCO2 and breath sounds checked- equal and bilateral Secured at: 21 cm Tube secured with: Tape Dental Injury: Teeth and Oropharynx as per pre-operative assessment  Difficulty Due To: Difficulty was unanticipated

## 2020-02-09 NOTE — Transfer of Care (Signed)
Immediate Anesthesia Transfer of Care Note  Patient: Brianna [name]  Procedure(s) Performed: EXPLORATORY LAPAROTOMY (N/A ) SMALL BOWEL RESECTION  Patient Location: PACU  Anesthesia Type:General  Level of Consciousness: drowsy and responds to stimulation  Airway & Oxygen Therapy: Patient Spontanous Breathing and non-rebreather face mask  Post-op Assessment: Report given to RN and Post -op Vital signs reviewed and stable  Post vital signs: Reviewed and stable  Last Vitals:  Vitals Value Taken Time  BP 102/73 02/09/20 1531  Temp    Pulse 80 02/09/20 1532  Resp 18 02/09/20 1532  SpO2 96 % 02/09/20 1532  Vitals shown include unvalidated device data.  Last Pain:  Vitals:   02/09/20 1128  TempSrc: Temporal  PainSc: 6       Patients Stated Pain Goal: 0 ([id number])  Complications: No apparent anesthesia complications

## 2020-02-09 NOTE — Progress Notes (Signed)
Dr. Andree Elk in to evaluate patient, no further orders.  Patient may proceed to floor.

## 2020-02-09 NOTE — Progress Notes (Signed)
15 minute call to floor. 

## 2020-02-09 NOTE — Progress Notes (Signed)
Dr. Andree Elk at bedside, wants chest xray done.  Will continue to monitor patient status.

## 2020-02-09 NOTE — ED Notes (Signed)
Pt aware of need for urine specimen.  Unable to obtain at this time.

## 2020-02-09 NOTE — ED Provider Notes (Signed)
[facility] Emergency Department Provider Note   ____________________________________________   First MD Initiated Contact with Patient 02/09/20 [phone]     (approximate)  I have reviewed the triage vital signs and the nursing notes.   HISTORY  Chief Complaint Shortness of Breath and Constipation    HPI Brianna [name] is a 59 y.o. female with past medical history of hypertension, diabetes, asthma, and GERD who presents to the ED complaining of abdominal pain, constipation, and shortness of breath.  Patient reports that she has not had a bowel movement in the past 4 days, has developed diffuse abdominal pain which is most severe around an umbilical hernia.  She states that she has had the umbilical hernia for a long time, but recently it had been more swollen and she has been unable to reduce it.  Over the past 2 days, she has had persistent nausea and vomiting, states she has been unable to keep anything down.  She is not sure if she has continued to pass gas.  She also endorses some difficulty breathing starting this morning, but denies any fever, cough, or pain in her chest.  She has been using her inhaler at home with some relief.        Past Medical History:  Diagnosis Date   Acid reflux    Asthma    Diabetes mellitus without complication ([place])    Hypertension     Patient Active Problem List   Diagnosis Date Noted   Incarcerated umbilical hernia 0000000    History reviewed. No pertinent surgical history.  Prior to Admission medications   Medication Sig Start Date End Date Taking? Authorizing Provider  albuterol (PROVENTIL) (2.5 MG/3ML) 0.083% nebulizer solution Take 2.5 mg by nebulization every 4 (four) hours as needed for shortness of breath or wheezing. 12/21/19  Yes [provider]  allopurinol (ZYLOPRIM) 300 MG tablet Take 300 mg by mouth daily. 12/21/19  Yes [provider]  amLODipine (NORVASC) 10 MG tablet  Take 10 mg by mouth daily. 12/21/19  Yes [provider]  ASPIRIN ADULT LOW STRENGTH 81 MG EC tablet Take 81 mg by mouth daily. 12/21/19  Yes [provider]  carvedilol (COREG) 6.25 MG tablet Take 6.25 mg by mouth 2 (two) times daily. 12/21/19  Yes [provider]  cetirizine (ZYRTEC) 10 MG tablet Take 10 mg by mouth daily. 09/18/19  Yes [provider]  furosemide (LASIX) 40 MG tablet Take 40 mg by mouth daily. 12/21/19  Yes [provider]  HUMULIN N 100 UNIT/ML injection Inject 25 Units into the skin 2 (two) times daily. 12/21/19  Yes [provider]  hydrochlorothiazide (HYDRODIURIL) 25 MG tablet Take 25 mg by mouth daily. 12/21/19  Yes [provider]  JANUVIA 100 MG tablet Take 100 mg by mouth daily. 12/21/19  Yes [provider]  omeprazole (PRILOSEC) 20 MG capsule Take 20 mg by mouth daily. 12/21/19  Yes [provider]  PROVENTIL HFA 108 (90 Base) MCG/ACT inhaler Inhale 2 puffs into the lungs 4 (four) times daily as needed for shortness of breath or wheezing. 12/21/19  Yes [provider]    Allergies Ace inhibitors, Lisinopril, and Lipitor [atorvastatin]  Family History  Problem Relation Age of Onset   Breast cancer Neg Hx     Social History Social History   Tobacco Use   Smoking status: Never Smoker   Smokeless tobacco: Never Used  Substance Use Topics   Alcohol use: Never  Drug use: Never    Review of Systems  Constitutional: No fever/chills Eyes: No visual changes. ENT: No sore throat. Cardiovascular: Denies chest pain. Respiratory: Positive for shortness of breath. Gastrointestinal: Positive for abdominal pain.  Positive for nausea and vomiting.  No diarrhea.  Positive for constipation. Genitourinary: Negative for dysuria. Musculoskeletal: Negative for back pain. Skin: Negative for rash. Neurological: Negative for headaches, focal weakness or  numbness.  ____________________________________________   PHYSICAL EXAM:  VITAL SIGNS: ED Triage Vitals  Enc Vitals Group     BP 02/09/20 0912 (!) 102/51     Pulse Rate 02/09/20 0912 99     Resp 02/09/20 0912 20     Temp 02/09/20 0910 98.5 F (36.9 C)     Temp Source 02/09/20 0910 Oral     SpO2 02/09/20 0912 95 %     Weight 02/09/20 0911 216 lb (98 kg)     Height 02/09/20 0911 4\' 9"  (1.448 m)     Head Circumference --      Peak Flow --      Pain Score 02/09/20 0910 10     Pain Loc --      Pain Edu? --      Excl. in [employer] [employer]? --     Constitutional: Alert and oriented. Eyes: Conjunctivae are normal. Head: Atraumatic. Nose: No congestion/rhinnorhea. Mouth/Throat: Mucous membranes are moist. Neck: Normal ROM Cardiovascular: Normal rate, regular rhythm. Grossly normal heart sounds. Respiratory: Normal respiratory effort.  No retractions. Lungs CTAB. Gastrointestinal: Ventral and umbilical hernias appreciated with umbilical hernia tender to palpation and not easily reducible, also with some overlying erythema and skin darkening.  Abdomen otherwise diffusely tender to palpation but soft. Genitourinary: deferred Musculoskeletal: No lower extremity tenderness nor edema. Neurologic:  Normal speech and language. No gross focal neurologic deficits are appreciated. Skin:  Skin is warm, dry and intact. No rash noted. Psychiatric: Mood and affect are normal. Speech and behavior are normal.  ____________________________________________   LABS (all labs ordered are listed, but only abnormal results are displayed)  Labs Reviewed  CBC WITH DIFFERENTIAL/PLATELET - Abnormal; Notable for the following components:      Result Value   WBC 26.1 (*)    Platelets 419 (*)    Neutro Abs 21.0 (*)    Monocytes Absolute 2.1 (*)    Abs Immature Granulocytes 0.31 (*)    All other components within normal limits  COMPREHENSIVE METABOLIC PANEL - Abnormal; Notable for the following components:    Potassium 3.4 (*)    Chloride 90 (*)    Glucose, Bld 274 (*)    BUN 74 (*)    Creatinine, Ser 6.44 (*)    Total Protein 9.1 (*)    Albumin 3.3 (*)    GFR calc non Af Amer 7 (*)    GFR calc Af Amer 8 (*)    Anion gap 23 (*)    All other components within normal limits  GLUCOSE, CAPILLARY - Abnormal; Notable for the following components:   Glucose-Capillary 224 (*)    All other components within normal limits  GLUCOSE, CAPILLARY - Abnormal; Notable for the following components:   Glucose-Capillary 144 (*)    All other components within normal limits  TROPONIN I (HIGH SENSITIVITY) - Abnormal; Notable for the following components:   Troponin I (High Sensitivity) 53 (*)    All other components within normal limits  RESPIRATORY PANEL BY RT PCR (FLU A&B, COVID)  LIPASE, BLOOD  LACTIC ACID, PLASMA  LACTIC ACID, PLASMA  URINALYSIS, COMPLETE (UACMP) WITH MICROSCOPIC  HIV ANTIBODY (ROUTINE TESTING W REFLEX)  HEMOGLOBIN A1C  SURGICAL PATHOLOGY  TROPONIN I (HIGH SENSITIVITY)   ____________________________________________  EKG  ED ECG REPORT I, Blake Divine, the attending physician, personally viewed and interpreted this ECG.   Date: 02/09/2020  EKG Time: 9:24  Rate: 96  Rhythm: normal sinus rhythm  Axis: LAD  Intervals:none  ST&T Change: Inferior Q waves   PROCEDURES  Procedure(s) performed (including Critical Care):  .Critical Care Performed by: Blake Divine, MD Authorized by: Blake Divine, MD   Critical care provider statement:    Critical care time (minutes):  45   Critical care time was exclusive of:  Separately billable procedures and treating other patients and teaching time   Critical care was necessary to treat or prevent imminent or life-threatening deterioration of the following conditions:  Sepsis   Critical care was time spent personally by me on the following activities:  Discussions with consultants, evaluation of patient's response to treatment,  examination of patient, ordering and performing treatments and interventions, ordering and review of laboratory studies, ordering and review of radiographic studies, pulse oximetry, re-evaluation of patient's condition, obtaining history from patient or surrogate and review of old charts   I assumed direction of critical care for this patient from another provider in my specialty: no       ____________________________________________   INITIAL IMPRESSION / ASSESSMENT AND PLAN / ED COURSE       59 year old female presents to the ED with 4 days of diffuse abdominal pain along with difficult to reduce umbilical hernia, has also not passed a bowel movement in this time and has had nausea with inability to tolerate p.o.  Abdominal exam concerning for strangulated versus incarcerated hernia as hernia is not easily reducible, is tender to palpation with overlying skin color changes.  Given this, I have asked Dr. Hampton Abbot of surgery to evaluate.  Lab work and CT is pending.  For her shortness of breath, she is not in any respiratory distress, EKG without acute ischemic changes, will check chest x-ray and troponin.  General surgery will plan to take patient to the OR for apparent strangulated hernia.  They state that CT scan is not needed at this time as it will not change management.  Lab work also noted to have significant AKI, however potassium within normal limits, will continue hydration with IV fluids.      ____________________________________________   FINAL CLINICAL IMPRESSION(S) / ED DIAGNOSES  Final diagnoses:  Strangulated umbilical hernia     ED Discharge Orders    None       Note:  This document was prepared using Dragon voice recognition software and may include unintentional dictation errors.   Blake Divine, MD 02/09/20 1610

## 2020-02-09 NOTE — Progress Notes (Signed)
Chest xray completed, patient more alert and awake.  Dr. Hampton Abbot at bedside to talk with and evaluate patient.

## 2020-02-09 NOTE — ED Triage Notes (Addendum)
C/o sob, weakness, constipation X4 days, and unable to eat without vomiting since Friday. Patient also c/o abdominal hernia that is hard to touch.

## 2020-02-09 NOTE — H&P (Signed)
[facility] SURGICAL HISTORY & PHYSICAL (cpt [phone])  HISTORY OF PRESENT ILLNESS (HPI):  59 y.o. female presented to [facility] ED today for abdominal pain. Patient reports that since the weekend she has noticed worsening, and now severe, abdominal pain surrounding her umbilicus at the site of her umbilical hernia. She reports that she has had this hernia "for some time," however more recently it has become more swollen and she has not been able to reduce it. Over the last 4 day she has also noted associated constipation, nausea, and emesis. She is still passing flatus. Unable to tolerate much PO in the last 2 days. No fever, chills, cough, congestion, CP, or SOB. No BM in the last 4 days either. No reported previous abdominal surgeries. Work up in the ED was concerning for likely pre-renal AKI with sCr in 6.44 and BUN of 74, a leukocytosis to 26K, and concerning for incarcerated umbilical hernia with possible small bowel strangulation.   General surgery is consulted by emergency medicine physician Dr Blake Divine, MD for evaluation and management of possible incarcerated umbilical hernia.    PAST MEDICAL HISTORY (PMH):  Past Medical History:  Diagnosis Date  . Acid reflux   . Asthma   . Diabetes mellitus without complication ([place])   . Hypertension     Reviewed. Otherwise negative.   PAST SURGICAL HISTORY ([place]):  History reviewed. No pertinent surgical history.  Reviewed. Otherwise negative.   MEDICATIONS:  Prior to Admission medications   Medication Sig Start Date End Date Taking? Authorizing Provider  albuterol (PROVENTIL) (2.5 MG/3ML) 0.083% nebulizer solution Take 2.5 mg by nebulization every 4 (four) hours as needed for shortness of breath or wheezing. 12/21/19  Yes [provider]  allopurinol (ZYLOPRIM) 300 MG tablet Take 300 mg by mouth daily. 12/21/19  Yes [provider]  amLODipine (NORVASC) 10 MG tablet Take 10 mg by mouth daily. 12/21/19  Yes  [provider]  ASPIRIN ADULT LOW STRENGTH 81 MG EC tablet Take 81 mg by mouth daily. 12/21/19  Yes [provider]  carvedilol (COREG) 6.25 MG tablet Take 6.25 mg by mouth 2 (two) times daily. 12/21/19  Yes [provider]  cetirizine (ZYRTEC) 10 MG tablet Take 10 mg by mouth daily. 09/18/19  Yes [provider]  furosemide (LASIX) 40 MG tablet Take 40 mg by mouth daily. 12/21/19  Yes [provider]  HUMULIN N 100 UNIT/ML injection Inject 25 Units into the skin 2 (two) times daily. 12/21/19  Yes [provider]  hydrochlorothiazide (HYDRODIURIL) 25 MG tablet Take 25 mg by mouth daily. 12/21/19  Yes [provider]  JANUVIA 100 MG tablet Take 100 mg by mouth daily. 12/21/19  Yes [provider]  omeprazole (PRILOSEC) 20 MG capsule Take 20 mg by mouth daily. 12/21/19  Yes [provider]  PROVENTIL HFA 108 (90 Base) MCG/ACT inhaler Inhale 2 puffs into the lungs 4 (four) times daily as needed for shortness of breath or wheezing. 12/21/19  Yes [provider]     ALLERGIES:  Allergies  Allergen Reactions  . Ace Inhibitors Anaphylaxis  . Lisinopril Anaphylaxis  . Lipitor [Atorvastatin] Other (See Comments)    Muscle aches     SOCIAL HISTORY:  Social History   Socioeconomic History  . Marital status: Single    Spouse name: Not on file  . Number of children: Not on file  . Years of education: Not on file  . Highest education level: Not on file  Occupational  History  . Not on file  Tobacco Use  . Smoking status: Never Smoker  . Smokeless tobacco: Never Used  Substance and Sexual Activity  . Alcohol use: Never  . Drug use: Never  . Sexual activity: Not on file  Other Topics Concern  . Not on file  Social History Narrative  . Not on file   Social Determinants of Health   Financial Resource Strain:   . Difficulty of Paying Living Expenses: Not on file  Food Insecurity:   . Worried About  Charity fundraiser in the Last Year: Not on file  . Ran Out of Food in the Last Year: Not on file  Transportation Needs:   . Lack of Transportation (Medical): Not on file  . Lack of Transportation (Non-Medical): Not on file  Physical Activity:   . Days of Exercise per Week: Not on file  . Minutes of Exercise per Session: Not on file  Stress:   . Feeling of Stress : Not on file  Social Connections:   . Frequency of Communication with Friends and Family: Not on file  . Frequency of Social Gatherings with Friends and Family: Not on file  . Attends Religious Services: Not on file  . Active Member of Clubs or Organizations: Not on file  . Attends Archivist Meetings: Not on file  . Marital Status: Not on file  Intimate Partner Violence:   . Fear of Current or Ex-Partner: Not on file  . Emotionally Abused: Not on file  . Physically Abused: Not on file  . Sexually Abused: Not on file     FAMILY HISTORY:  Family History  Problem Relation Age of Onset  . Breast cancer Neg Hx     Otherwise negative.   REVIEW OF SYSTEMS:  Review of Systems  Constitutional: Negative for chills and fever.       + Decreased Appetite   HENT: Negative for congestion and sore throat.   Respiratory: Negative for cough and shortness of breath.   Cardiovascular: Negative for chest pain and palpitations.  Gastrointestinal: Positive for abdominal pain, constipation, nausea and vomiting. Negative for diarrhea.  Genitourinary: Negative for dysuria and urgency.  All other systems reviewed and are negative.   VITAL SIGNS:  Temp:  [98.5 F (36.9 C)] 98.5 F (36.9 C) (02/09 0910) Pulse Rate:  [86-99] 86 (02/09 1030) Resp:  [20-30] 30 (02/09 1030) BP: (102)/(51) 102/51 (02/09 0912) SpO2:  [91 %-95 %] 91 % (02/09 1030) Weight:  [98 kg] 98 kg (02/09 0911)     Height: 4\' 9"  (144.8 cm) Weight: 98 kg BMI (Calculated): 46.73   PHYSICAL EXAM:  Physical Exam Vitals and nursing note reviewed.   Constitutional:      General: She is not in acute distress.    Appearance: She is well-developed. She is obese. She is not ill-appearing.  HENT:     Head: Normocephalic and atraumatic.  Eyes:     Extraocular Movements: Extraocular movements intact.     Pupils: Pupils are equal, round, and reactive to light.  Cardiovascular:     Rate and Rhythm: Normal rate and regular rhythm.     Pulses: Normal pulses.     Heart sounds: No murmur. No friction rub. No gallop.   Pulmonary:     Effort: Pulmonary effort is normal. No respiratory distress.     Breath sounds: Normal breath sounds. No decreased breath sounds, wheezing or rhonchi.  Abdominal:     General: There is no  distension.     Palpations: Abdomen is soft.     Tenderness: There is abdominal tenderness in the periumbilical area.     Hernia: A hernia is present. Hernia is present in the umbilical area.     Comments: She has an obvious large, tense, umbilical hernia which is tender to palpation. There is overlying changes to the skin. The remainder of her abdomen is soft and non-tender  Genitourinary:    Comments: Deferred Musculoskeletal:        General: Normal range of motion.     Right lower leg: No edema.     Left lower leg: No edema.  Skin:    General: Skin is warm and dry.     Coloration: Skin is not pale.     Findings: Erythema (Over umbilicus) present.  Neurological:     General: No focal deficit present.     Mental Status: She is alert and oriented to person, place, and time.  Psychiatric:        Mood and Affect: Mood normal.        Behavior: Behavior normal.     INTAKE/OUTPUT:  This shift: No intake/output data recorded.  Last 2 shifts: @IOLAST2SHIFTS @  Labs:  CBC Latest Ref Rng & Units 02/09/2020 04/22/2013  WBC 4.0 - 10.5 K/uL 26.1(H) 7.2  Hemoglobin 12.0 - 15.0 g/dL 13.9 14.6  Hematocrit 36.0 - 46.0 % 41.9 44.0  Platelets 150 - 400 K/uL 419(H) 210   CMP Latest Ref Rng & Units 02/09/2020 04/22/2013  Glucose 70 -  99 mg/dL 274(H) 386(H)  BUN 6 - 20 mg/dL 74(H) 14  Creatinine 0.44 - 1.00 mg/dL 6.44(H) 1.08  Sodium 135 - 145 mmol/L 137 133(L)  Potassium 3.5 - 5.1 mmol/L 3.4(L) 3.7  Chloride 98 - 111 mmol/L 90(L) 100  CO2 22 - 32 mmol/L 24 22  Calcium 8.9 - 10.3 mg/dL 9.9 9.4  Total Protein 6.5 - 8.1 g/dL 9.1(H) -  Total Bilirubin 0.3 - 1.2 mg/dL 0.9 -  Alkaline Phos 38 - 126 U/L 111 -  AST 15 - 41 U/L 19 -  ALT 0 - 44 U/L 17 -    Assessment/Plan: (ICD-10's: K42.0) 59 y.o. female with examination concerning for incarcerated umbilical hernia and possible bowel strangulation which is complicated by leukocytosis to 25, and severe AKi with sCr of 6.44.   - We will proceed urgently to the OR for exploratory laparotomy and possible small bowel resection with Dr Hampton Abbot pending OR/Anesthesia availability.   - All risks, benefits, and alternatives to above procedure(s) were discussed with the patient, all of her questions were answered to her expressed satisfaction, patient expresses she wishes to proceed, and informed consent was obtained.    - NPO  - Aggressive IVF resuscitation +/- boluses   - IV ABx (Zosyn)  - pain control; antiemetics prn  - Monitor abdominal examination  - trend leukocytosis; renal function; morning labs  - medical management of comorbid conditions; convert to home meds to IV   - DVT prophylaxis; hold for OR  All of the above findings and recommendations were discussed with the patient, and all of her questions were answered to her expressed satisfaction.  -- Edison Simon, PA-C [facility] 02/09/2020, 10:36 AM [phone] M-F: 7am - 4pm

## 2020-02-10 DIAGNOSIS — Z794 Long term (current) use of insulin: Secondary | ICD-10-CM

## 2020-02-10 DIAGNOSIS — N184 Chronic kidney disease, stage 4 (severe): Secondary | ICD-10-CM

## 2020-02-10 DIAGNOSIS — E119 Type 2 diabetes mellitus without complications: Secondary | ICD-10-CM

## 2020-02-10 DIAGNOSIS — N170 Acute kidney failure with tubular necrosis: Secondary | ICD-10-CM

## 2020-02-10 DIAGNOSIS — I10 Essential (primary) hypertension: Secondary | ICD-10-CM

## 2020-02-10 DIAGNOSIS — K436 Other and unspecified ventral hernia with obstruction, without gangrene: Principal | ICD-10-CM

## 2020-02-10 LAB — CBC
HCT: 33.5 % — ABNORMAL LOW (ref 36.0–46.0)
Hemoglobin: 10.9 g/dL — ABNORMAL LOW (ref 12.0–15.0)
MCH: 29.5 pg (ref 26.0–34.0)
MCHC: 32.5 g/dL (ref 30.0–36.0)
MCV: 90.8 fL (ref 80.0–100.0)
Platelets: 304 10*3/uL (ref 150–400)
RBC: 3.69 MIL/uL — ABNORMAL LOW (ref 3.87–5.11)
RDW: 14.2 % (ref 11.5–15.5)
WBC: 12.8 10*3/uL — ABNORMAL HIGH (ref 4.0–10.5)
nRBC: 0 % (ref 0.0–0.2)

## 2020-02-10 LAB — COMPREHENSIVE METABOLIC PANEL
ALT: 15 U/L (ref 0–44)
AST: 21 U/L (ref 15–41)
Albumin: 2.2 g/dL — ABNORMAL LOW (ref 3.5–5.0)
Alkaline Phosphatase: 67 U/L (ref 38–126)
Anion gap: 15 (ref 5–15)
BUN: 79 mg/dL — ABNORMAL HIGH (ref 6–20)
CO2: 23 mmol/L (ref 22–32)
Calcium: 7.8 mg/dL — ABNORMAL LOW (ref 8.9–10.3)
Chloride: 102 mmol/L (ref 98–111)
Creatinine, Ser: 6.92 mg/dL — ABNORMAL HIGH (ref 0.44–1.00)
GFR calc Af Amer: 7 mL/min — ABNORMAL LOW (ref >60)
GFR calc non Af Amer: 6 mL/min — ABNORMAL LOW (ref >60)
Glucose, Bld: 118 mg/dL — ABNORMAL HIGH (ref 70–99)
Potassium: 3.6 mmol/L (ref 3.5–5.1)
Sodium: 140 mmol/L (ref 135–145)
Total Bilirubin: 0.8 mg/dL (ref 0.3–1.2)
Total Protein: 6.1 g/dL — ABNORMAL LOW (ref 6.5–8.1)

## 2020-02-10 LAB — PHOSPHORUS: Phosphorus: 9.3 mg/dL — ABNORMAL HIGH (ref 2.5–4.6)

## 2020-02-10 LAB — GLUCOSE, CAPILLARY
Glucose-Capillary: 100 mg/dL — ABNORMAL HIGH (ref 70–99)
Glucose-Capillary: 103 mg/dL — ABNORMAL HIGH (ref 70–99)
Glucose-Capillary: 104 mg/dL — ABNORMAL HIGH (ref 70–99)
Glucose-Capillary: 106 mg/dL — ABNORMAL HIGH (ref 70–99)
Glucose-Capillary: 118 mg/dL — ABNORMAL HIGH (ref 70–99)
Glucose-Capillary: 139 mg/dL — ABNORMAL HIGH (ref 70–99)

## 2020-02-10 LAB — HEMOGLOBIN A1C
Hgb A1c MFr Bld: 7.2 % — ABNORMAL HIGH (ref 4.8–5.6)
Mean Plasma Glucose: 160 mg/dL

## 2020-02-10 LAB — MAGNESIUM: Magnesium: 2.1 mg/dL (ref 1.7–2.4)

## 2020-02-10 MED ORDER — CHLORHEXIDINE GLUCONATE CLOTH 2 % EX PADS
6.0000 | MEDICATED_PAD | Freq: Every day | CUTANEOUS | Status: DC
  Administered 2020-02-10 – 2020-02-14 (×5): 6 via TOPICAL

## 2020-02-10 MED ORDER — METOPROLOL TARTRATE 5 MG/5ML IV SOLN: 5.0000 mg | Freq: Four times a day (QID) | INTRAVENOUS | Status: DC | PRN

## 2020-02-10 MED ORDER — INSULIN ASPART 100 UNIT/ML ~~LOC~~ SOLN
0.0000 [IU] | Freq: Three times a day (TID) | SUBCUTANEOUS | Status: DC
  Administered 2020-02-11 – 2020-02-12 (×3): 1 [IU] via SUBCUTANEOUS
  Administered 2020-02-12 – 2020-02-13 (×3): 2 [IU] via SUBCUTANEOUS
  Administered 2020-02-13: 08:00:00 1 [IU] via SUBCUTANEOUS
  Administered 2020-02-14: 12:00:00 2 [IU] via SUBCUTANEOUS
  Administered 2020-02-14: 09:00:00 1 [IU] via SUBCUTANEOUS
  Filled 2020-02-10 (×9): qty 1

## 2020-02-10 MED ORDER — INSULIN ASPART 100 UNIT/ML ~~LOC~~ SOLN: 0.0000 [IU] | Freq: Every day | SUBCUTANEOUS | Status: DC

## 2020-02-10 MED ORDER — HEPARIN SODIUM (PORCINE) 5000 UNIT/ML IJ SOLN
5000.0000 [IU] | Freq: Three times a day (TID) | INTRAMUSCULAR | Status: DC
  Administered 2020-02-10 – 2020-02-14 (×11): 5000 [IU] via SUBCUTANEOUS
  Filled 2020-02-10 (×11): qty 1

## 2020-02-10 NOTE — Consult Note (Signed)
[address] [place], [place] [address] Phone [phone]. Fax [phone]  Date: 02/10/2020                  Patient Name:  Brianna [name]  MRN: [id number]  DOB: June 09, 1961  Age / Sex: 59 y.o., female         PCP: Donnie Coffin, MD                 Service Requesting Consult: IM/ Olean Ree, MD                 Reason for Consult: ARF            History of Present Illness: Patient is a 59 y.o. female with medical problems of diabetes for 15 to 20 years, poorly controlled, asthma, GERD, who was admitted to [facility] on 02/09/2020 for evaluation of abdominal pain, nausea, vomiting, shortness of breath.  Diagnosed with strangulated hernia and is status post ventral hernia repair and small bowel resection done on February 09, 2020.  Serum creatinine is noted to be elevated and therefore nephrology consult was requested for evaluation Patient was seen with PA student Ms. Clemetine Marker.  I agree with her note with additions as follows    Medications: Outpatient medications: Medications Prior to Admission  Medication Sig Dispense Refill Last Dose  . albuterol (PROVENTIL) (2.5 MG/3ML) 0.083% nebulizer solution Take 2.5 mg by nebulization every 4 (four) hours as needed for shortness of breath or wheezing.   Unknown at PRN  . allopurinol (ZYLOPRIM) 300 MG tablet Take 300 mg by mouth daily.   72+ hours at Unknown  . amLODipine (NORVASC) 10 MG tablet Take 10 mg by mouth daily.   72+ hours at Unknown  . ASPIRIN ADULT LOW STRENGTH 81 MG EC tablet Take 81 mg by mouth daily.   72+ hours at Unknown  . carvedilol (COREG) 6.25 MG tablet Take 6.25 mg by mouth 2 (two) times daily.   72+ hours at Unknown  . cetirizine (ZYRTEC) 10 MG tablet Take 10 mg by mouth daily.   72+ hours at Unknown  . furosemide (LASIX) 40 MG tablet Take 40 mg by mouth daily.   72+ hours at Unknown  . HUMULIN N 100 UNIT/ML injection Inject 25 Units into the skin 2 (two) times daily.   72+ hours at Unknown  .  hydrochlorothiazide (HYDRODIURIL) 25 MG tablet Take 25 mg by mouth daily.   72+ hours at Unknown  . JANUVIA 100 MG tablet Take 100 mg by mouth daily.   72+ hours at Unknown  . omeprazole (PRILOSEC) 20 MG capsule Take 20 mg by mouth daily.   72+ hours at Unknown  . PROVENTIL HFA 108 (90 Base) MCG/ACT inhaler Inhale 2 puffs into the lungs 4 (four) times daily as needed for shortness of breath or wheezing.   Unknown at PRN    Current medications: Current Facility-Administered Medications  Medication Dose Route Frequency Provider Last Rate Last Admin  . 0.9 %  sodium chloride infusion   Intravenous Continuous Piscoya, Jose, MD 100 mL/hr at 02/10/20 1500 Rate Verify at 02/10/20 1500  . albuterol (PROVENTIL) (2.5 MG/3ML) 0.083% nebulizer solution 2.5 mg  2.5 mg Nebulization Q4H PRN Piscoya, Jose, MD      . Chlorhexidine Gluconate Cloth 2 % PADS 6 each  6 each Topical Daily Piscoya, Jose, MD   6 each at 02/10/20 1000  . hydrALAZINE (APRESOLINE) injection 10 mg  10 mg Intravenous Q2H PRN Piscoya,  Jose, MD      . HYDROmorphone (DILAUDID) injection 0.5 mg  0.5 mg Intravenous Q4H PRN Piscoya, Jose, MD   0.5 mg at 02/10/20 1433  . insulin aspart (novoLOG) injection 0-20 Units  0-20 Units Subcutaneous Q4H Olean Ree, MD   3 Units at 02/10/20 0016  . metoprolol tartrate (LOPRESSOR) injection 5 mg  5 mg Intravenous Q6H Piscoya, Jose, MD   5 mg at 02/10/20 1212  . ondansetron (ZOFRAN-ODT) disintegrating tablet 4 mg  4 mg Oral Q6H PRN Piscoya, Jose, MD       Or  . ondansetron (ZOFRAN) injection 4 mg  4 mg Intravenous Q6H PRN Piscoya, Jose, MD      . pantoprazole (PROTONIX) injection 40 mg  40 mg Intravenous QHS Piscoya, Jose, MD   40 mg at 02/09/20 2215  . piperacillin-tazobactam (ZOSYN) IVPB 3.375 g  3.375 g Intravenous Q12H Olean Ree, MD   Stopped at 02/10/20 1321      Allergies: Allergies  Allergen Reactions  . Ace Inhibitors Anaphylaxis  . Lisinopril Anaphylaxis  . Lipitor [Atorvastatin] Other  (See Comments)    Muscle aches      Past Medical History: Past Medical History:  Diagnosis Date  . Acid reflux   . Asthma   . Diabetes mellitus without complication ([place])   . Hypertension      Past Surgical History: Past Surgical History:  Procedure Laterality Date  . BOWEL RESECTION  02/09/2020   Procedure: SMALL BOWEL RESECTION;  Surgeon: Olean Ree, MD;  Location: [facility] ORS;  Service: General;;  . LAPAROTOMY N/A 02/09/2020   Procedure: EXPLORATORY LAPAROTOMY;  Surgeon: Olean Ree, MD;  Location: [facility] ORS;  Service: General;  Laterality: N/A;     Family History: Family History  Problem Relation Age of Onset  . Breast cancer Neg Hx      Social History: Social History   Socioeconomic History  . Marital status: Single    Spouse name: Not on file  . Number of children: Not on file  . Years of education: Not on file  . Highest education level: Not on file  Occupational History  . Not on file  Tobacco Use  . Smoking status: Never Smoker  . Smokeless tobacco: Never Used  Substance and Sexual Activity  . Alcohol use: Never  . Drug use: Never  . Sexual activity: Not Currently  Other Topics Concern  . Not on file  Social History Narrative  . Not on file   Social Determinants of Health   Financial Resource Strain:   . Difficulty of Paying Living Expenses: Not on file  Food Insecurity:   . Worried About Charity fundraiser in the Last Year: Not on file  . Ran Out of Food in the Last Year: Not on file  Transportation Needs:   . Lack of Transportation (Medical): Not on file  . Lack of Transportation (Non-Medical): Not on file  Physical Activity:   . Days of Exercise per Week: Not on file  . Minutes of Exercise per Session: Not on file  Stress:   . Feeling of Stress : Not on file  Social Connections:   . Frequency of Communication with Friends and Family: Not on file  . Frequency of Social Gatherings with Friends and Family: Not on file  . Attends  Religious Services: Not on file  . Active Member of Clubs or Organizations: Not on file  . Attends Archivist Meetings: Not on file  . Marital Status: Not  on file  Intimate Partner Violence:   . Fear of Current or Ex-Partner: Not on file  . Emotionally Abused: Not on file  . Physically Abused: Not on file  . Sexually Abused: Not on file     Review of Systems: Gen: Denies fevers or chills HEENT: No vision or hearing complaints CV: No chest pain or shortness of breath.  Denies previous cardiovascular events Resp: No shortness of breath, cough GI: Prior to admission, appetite was good.  Currently has NG tube GU : Denies any previous renal problems.   MS: States she is able to ambulate at home Derm:    No complaint Psych: No complaints Heme: No complaints Neuro: No complaints Endocrine.  No complaints  Vital Signs: Blood pressure 130/76, pulse 80, temperature 98.4 F (36.9 C), temperature source Oral, resp. rate 20, height 4\' 9"  (1.448 m), weight 98 kg, SpO2 93 %.   Intake/Output Summary (Last 24 hours) at 02/10/2020 1541 Last data filed at 02/10/2020 1500 Gross per 24 hour  Intake 2198.08 ml  Output 270 ml  Net 1928.08 ml    Weight trends: [employer]   02/09/20 0911 02/09/20 1128  Weight: 98 kg 98 kg    Physical Exam: General:  No acute distress, sitting up in the recliner chair  HEENT  anicteric, moist oral mucous membranes, NG tube in place  Neck:  Supple, no masses  Lungs:  Mild basilar rhonchi, otherwise clear  Heart::  No rub or gallop  Abdomen:  Soft, nontender, midline incision, and drain in place  Extremities:  No edema  Neurologic:  Alert, able to answer questions  Skin:  Warm, dry  Foley in place with sediment  Lab results: Basic Metabolic Panel: Recent Labs  Lab 02/09/20 0914 02/10/20 0556  NA 137 140  K 3.4* 3.6  CL 90* 102  CO2 24 23  GLUCOSE 274* 118*  BUN 74* 79*  CREATININE 6.44* 6.92*  CALCIUM 9.9 7.8*  MG  --  2.1  PHOS   --  9.3*    Liver Function Tests: Recent Labs  Lab 02/10/20 0556  AST 21  ALT 15  ALKPHOS 67  BILITOT 0.8  PROT 6.1*  ALBUMIN 2.2*   Recent Labs  Lab 02/09/20 0931  LIPASE 19   No results for input(s): AMMONIA in the last 168 hours.  CBC: Recent Labs  Lab 02/09/20 0914 02/10/20 0556  WBC 26.1* 12.8*  NEUTROABS 21.0*  --   HGB 13.9 10.9*  HCT 41.9 33.5*  MCV 89.3 90.8  PLT 419* 304    Cardiac Enzymes: No results for input(s): CKTOTAL, TROPONINI in the last 168 hours.  BNP: Invalid input(s): POCBNP  CBG: Recent Labs  Lab 02/09/20 2010 02/09/20 2359 02/10/20 0511 02/10/20 0818 02/10/20 1158  GLUCAP 140* 139* 118* 103* 106*    Microbiology: Recent Results (from the past 720 hour(s))  Respiratory Panel by RT PCR (Flu A&B, Covid) - Nasopharyngeal Swab     Status: None   Collection Time: 02/09/20  9:55 AM   Specimen: Nasopharyngeal Swab  Result Value Ref Range Status   SARS Coronavirus 2 by RT PCR NEGATIVE NEGATIVE Final    Comment: (NOTE) SARS-CoV-2 target nucleic acids are NOT DETECTED. The SARS-CoV-2 RNA is generally detectable in upper respiratoy specimens during the acute phase of infection. The lowest concentration of SARS-CoV-2 viral copies this assay can detect is 131 copies/mL. A negative result does not preclude SARS-Cov-2 infection and should not be used as the sole basis for  treatment or other patient management decisions. A negative result may occur with  improper specimen collection/handling, submission of specimen other than nasopharyngeal swab, presence of viral mutation(s) within the areas targeted by this assay, and inadequate number of viral copies (<131 copies/mL). A negative result must be combined with clinical observations, patient history, and epidemiological information. The expected result is Negative. Fact Sheet for Patients:  PinkCheek.be Fact Sheet for Healthcare Providers:   GravelBags.it This test is not yet ap proved or cleared by the [place] FDA and  has been authorized for detection and/or diagnosis of SARS-CoV-2 by FDA under an Emergency Use Authorization (EUA). This EUA will remain  in effect (meaning this test can be used) for the duration of the COVID-19 declaration under Section 564(b)(1) of the Act, 21 U.S.C. section 360bbb-3(b)(1), unless the authorization is terminated or revoked sooner.    Influenza A by PCR NEGATIVE NEGATIVE Final   Influenza B by PCR NEGATIVE NEGATIVE Final    Comment: (NOTE) The Xpert Xpress SARS-CoV-2/FLU/RSV assay is intended as an aid in  the diagnosis of influenza from Nasopharyngeal swab specimens and  should not be used as a sole basis for treatment. Nasal washings and  aspirates are unacceptable for Xpert Xpress SARS-CoV-2/FLU/RSV  testing. Fact Sheet for Patients: PinkCheek.be Fact Sheet for Healthcare Providers: GravelBags.it This test is not yet approved or cleared by the [place] FDA and  has been authorized for detection and/or diagnosis of SARS-CoV-2 by  FDA under an Emergency Use Authorization (EUA). This EUA will remain  in effect (meaning this test can be used) for the duration of the  Covid-19 declaration under Section 564(b)(1) of the Act, 21  U.S.C. section 360bbb-3(b)(1), unless the authorization is  terminated or revoked. Performed at [facility], [address] [place], [place] [address]      Coagulation Studies: No results for input(s): LABPROT, INR in the last 72 hours.  Urinalysis: No results for input(s): COLORURINE, LABSPEC, PHURINE, GLUCOSEU, HGBUR, BILIRUBINUR, KETONESUR, PROTEINUR, UROBILINOGEN, NITRITE, LEUKOCYTESUR in the last 72 hours.  Invalid input(s): APPERANCEUR      Imaging: X-ray chest PA or AP  Result Date: 02/09/2020 CLINICAL DATA:  Increasing shortness of  breath EXAM: CHEST  1 VIEW COMPARISON:  02/09/2020 FINDINGS: Cardiac shadow is mildly prominent but stable from the prior exam. Aortic calcifications are seen. Gastric catheter extends into the stomach. Mild bibasilar atelectatic changes are seen. The overall inspiratory effort is poor. IMPRESSION: Mild bibasilar atelectasis. Electronically Signed   By: Inez Catalina M.D.   On: 02/09/2020 16:21   DG Chest Port 1 View  Result Date: 02/09/2020 CLINICAL DATA:  Shortness of breath, weakness and constipation for the past 4 days. Nausea and vomiting. Abdominal hernia which is firm to palpation. EXAM: PORTABLE CHEST 1 VIEW COMPARISON:  04/22/2013 FINDINGS: Poor inspiration. Interval borderline enlarged cardiac silhouette. Interval mild linear density at both lung bases. Otherwise, clear lungs. Aortic arch calcifications. Thoracic spine and bilateral shoulder degenerative changes. IMPRESSION: 1. Interval borderline cardiomegaly and mild bibasilar linear atelectasis or scarring. 2. Aortic atherosclerosis. Electronically Signed   By: Claudie Revering M.D.   On: 02/09/2020 09:55      Assessment & Plan: Pt is a 59 y.o. African-American  female with diabetes for 15 to 20 years, hypertension, asthma, was admitted on 02/09/2020 with Incarcerated umbilical hernia 0000000 SOB (shortness of breath) [R06.02]  #Acute kidney injury No recent baseline creatinine is available.  Patient states she is followed at [facility].  We  will try to obtain labs from there. Last known creatinine in 2014 was 1.08, GFR greater than 60 Admission creatinine of 6.44, GFR 8 Today has worsened to 6.92 with BUN 79 Phosphorus is elevated at 9.3 and albumin is low at 2.2 No renal imaging is available No urinalysis is available Previously patient had glucosuria and proteinuria in 2014  Plan to obtain urinalysis, urine protein to creatinine ratio We will obtain renal imaging once abdominal pain from surgery is well  controlled  #Diabetes type 2 with CKD Patient has unspecified chronic kidney disease Obtain baseline creatinine from outpatient records Lab Results  Component Value Date   HGBA1C 7.2 (H) 02/09/2020       LOS: 1 Traxton Kolenda 2/10/20213:41 PM    Note: This note was prepared with Dragon dictation. Any transcription errors are unintentional

## 2020-02-10 NOTE — Progress Notes (Signed)
[facility] Day(s): 1.   Post op day(s): 1 Day Post-Op.   Interval History:  Patient seen and examined no acute events or new complaints overnight.  Patient reports she is feeling good this morning.  She has expected post-surgical soreness at incision No fever, chills, nausea, or emesis sCr worsened to 6.92, BUN worsened to 79, U/O - 220 Leukocytosis significantly improved to 12K (from 26k) Severe hyperphosphatemia to 9.6 NGT with 50 ccs out (after 2.5L intra-operatively); no bowel function reported Surgical drain with 20 ccs out   Vital signs in last 24 hours: [min-max] current  Temp:  [97.2 F (36.2 C)-98.5 F (36.9 C)] 98.5 F (36.9 C) (02/10 0435) Pulse Rate:  [70-99] 80 (02/10 0435) Resp:  [16-30] 16 (02/10 0435) BP: (93-145)/(51-97) 131/74 (02/10 0435) SpO2:  [90 %-96 %] 93 % (02/10 0435) Weight:  [98 kg] 98 kg (02/09 1128)     Height: 4\' 9"  (144.8 cm) Weight: 98 kg BMI (Calculated): 46.73   Intake/Output last 2 shifts:  02/09 0701 - 02/10 0700 In: 3573.5 [I.V.:3108; IV Piggyback:465.6] Out: 2690 [Urine:220; Emesis/NG output:50; Drains:20; Blood:100]   Physical Exam:  Constitutional: alert, cooperative and no distress  HEENT: NGT in place Respiratory: breathing non-labored at rest  Cardiovascular: regular rate and sinus rhythm  Gastrointestinal: soft, incisional soreness, and non-distended. No rebound/guarding. Surgical drain to the left of midline with serosanguinous output  Integumentary: Laparotomy incision is CDI with honeycomb in place, no erythema or drainage appreciable    Labs:  CBC Latest Ref Rng & Units 02/10/2020 02/09/2020 04/22/2013  WBC 4.0 - 10.5 K/uL 12.8(H) 26.1(H) 7.2  Hemoglobin 12.0 - 15.0 g/dL 10.9(L) 13.9 14.6  Hematocrit 36.0 - 46.0 % 33.5(L) 41.9 44.0  Platelets 150 - 400 K/uL 304 419(H) 210   CMP Latest Ref Rng & Units 02/10/2020 02/09/2020 04/22/2013  Glucose 70 - 99 mg/dL 118(H) 274(H)  386(H)  BUN 6 - 20 mg/dL 79(H) 74(H) 14  Creatinine 0.44 - 1.00 mg/dL 6.92(H) 6.44(H) 1.08  Sodium 135 - 145 mmol/L 140 137 133(L)  Potassium 3.5 - 5.1 mmol/L 3.6 3.4(L) 3.7  Chloride 98 - 111 mmol/L 102 90(L) 100  CO2 22 - 32 mmol/L 23 24 22   Calcium 8.9 - 10.3 mg/dL 7.8(L) 9.9 9.4  Total Protein 6.5 - 8.1 g/dL 6.1(L) 9.1(H) -  Total Bilirubin 0.3 - 1.2 mg/dL 0.8 0.9 -  Alkaline Phos 38 - 126 U/L 67 111 -  AST 15 - 41 U/L 21 19 -  ALT 0 - 44 U/L 15 17 -     Imaging studies: No new pertinent imaging studies   Assessment/Plan:  59 y.o. female with worsening, likely prerenal AKI but otherwise improved leukocytosis 1 Day Post-Op s/p exploratory laparotomy, strangulated ventral hernia repair, small bowel resection, and excision of subcutaneous tissue for strangulated ventral hernia.   - Continue NPO + IVF resuscitation   - Continue NGT decompression; monitor and record output   - Continue IV ABx (Zosyn)  - Pain control prn; antiemetics prn  - Monitor abdominal examination; on-going bowel function   - Continue foley; trend renal function; monitor U/O; will consult nephrology this morning  - Trend leukocytosis; electrolytes  - Mobilization encouraged  - Medical management of comorbidities; will consult hospitalist service for assistance   All of the above findings and recommendations were discussed with the patient, and the medical team, and all of patient's questions were answered to her expressed satisfaction.  -- Edison Simon, PA-C   [facility] 02/10/2020, 7:46 AM [phone] M-F: 7am - 4pm

## 2020-02-10 NOTE — Consult Note (Signed)
[address] [place], [place] [address] Phone [phone]. Fax [phone]  Date: 02/10/2020                  Patient Name:  Brianna [name]  MRN: [id number]  DOB: 1961/02/03  Age / Sex: 59 y.o., female         PCP: Donnie Coffin, MD                 Service Requesting Consult: Surgery / Olean Ree, MD                 Reason for Consult: Acute Kidney Injury            History of Present Illness: Patient is a 59 y.o. female with medical problems of hypertension, diabetes Type II, asthma, GERD who was admitted to [facility] on 02/09/2020 for evaluation of abdominal pain with persistent nausea/vomiting, constipation, and shortness of breath. She is admitted for strangulated hernia s/p strangulated ventral hernia repair, small bowel resection, debridement and excision of skin and subcutaneous tissue on 02/09/20. Today she is feeling much better, reporting 5/10 abdominal soreness which was 10/10 pain prior to surgery.  Prior to admission she was unable to tolerate any PO intake including liquids for about 3 days. Reports she noticed decreased urine output as well.  Chart review reveals that the patient has been diagnosed with CKD stage III and was referred to nephrology both in April 2019 and July 2020. However the patient denies history of kidney disease and states she has never seen a nephrologist. She denies any swelling prior to arrival. Denies hematuria. She was on both HCTZ and Lasix at home.   Medications: Outpatient medications: Medications Prior to Admission  Medication Sig Dispense Refill Last Dose   albuterol (PROVENTIL) (2.5 MG/3ML) 0.083% nebulizer solution Take 2.5 mg by nebulization every 4 (four) hours as needed for shortness of breath or wheezing.   Unknown at PRN   allopurinol (ZYLOPRIM) 300 MG tablet Take 300 mg by mouth daily.   72+ hours at Unknown   amLODipine (NORVASC) 10 MG tablet Take 10 mg by mouth daily.   72+ hours at Unknown   ASPIRIN ADULT LOW  STRENGTH 81 MG EC tablet Take 81 mg by mouth daily.   72+ hours at Unknown   carvedilol (COREG) 6.25 MG tablet Take 6.25 mg by mouth 2 (two) times daily.   72+ hours at Unknown   cetirizine (ZYRTEC) 10 MG tablet Take 10 mg by mouth daily.   72+ hours at Unknown   furosemide (LASIX) 40 MG tablet Take 40 mg by mouth daily.   72+ hours at Unknown   HUMULIN N 100 UNIT/ML injection Inject 25 Units into the skin 2 (two) times daily.   72+ hours at Unknown   hydrochlorothiazide (HYDRODIURIL) 25 MG tablet Take 25 mg by mouth daily.   72+ hours at Unknown   JANUVIA 100 MG tablet Take 100 mg by mouth daily.   72+ hours at Unknown   omeprazole (PRILOSEC) 20 MG capsule Take 20 mg by mouth daily.   72+ hours at Unknown   PROVENTIL HFA 108 (90 Base) MCG/ACT inhaler Inhale 2 puffs into the lungs 4 (four) times daily as needed for shortness of breath or wheezing.   Unknown at PRN    Current medications: Current Facility-Administered Medications  Medication Dose Route Frequency Provider Last Rate Last Admin   0.9 %  sodium chloride infusion   Intravenous Continuous Olean Ree, MD  Stopped at 02/10/20 0001   acetaminophen (OFIRMEV) IV 1,000 mg  1,000 mg Intravenous Q6H Piscoya, Jose, MD 400 mL/hr at 02/10/20 0520 1,000 mg at 02/10/20 0520   albuterol (PROVENTIL) (2.5 MG/3ML) 0.083% nebulizer solution 2.5 mg  2.5 mg Nebulization Q4H PRN Piscoya, Jose, MD       Chlorhexidine Gluconate Cloth 2 % PADS 6 each  6 each Topical Daily Piscoya, Jose, MD       hydrALAZINE (APRESOLINE) injection 10 mg  10 mg Intravenous Q2H PRN Piscoya, Jose, MD       HYDROmorphone (DILAUDID) injection 0.5 mg  0.5 mg Intravenous Q4H PRN Piscoya, Jose, MD   0.5 mg at 02/10/20 0518   insulin aspart (novoLOG) injection 0-20 Units  0-20 Units Subcutaneous Q4H Piscoya, Jacqulyn Bath, MD   3 Units at 02/10/20 0016   metoprolol tartrate (LOPRESSOR) injection 5 mg  5 mg Intravenous Q6H Piscoya, Jose, MD   5 mg at 02/10/20 0518   ondansetron (ZOFRAN-ODT)  disintegrating tablet 4 mg  4 mg Oral Q6H PRN Piscoya, Jose, MD       Or   ondansetron (ZOFRAN) injection 4 mg  4 mg Intravenous Q6H PRN Piscoya, Jose, MD       pantoprazole (PROTONIX) injection 40 mg  40 mg Intravenous QHS Piscoya, Jose, MD   40 mg at 02/09/20 2215   piperacillin-tazobactam (ZOSYN) IVPB 3.375 g  3.375 g Intravenous Q12H Piscoya, Jose, MD 12.5 mL/hr at 02/10/20 0907 3.375 g at 02/10/20 [id number]      Allergies: Allergies  Allergen Reactions   Ace Inhibitors Anaphylaxis   Lisinopril Anaphylaxis   Lipitor [Atorvastatin] Other (See Comments)    Muscle aches      Past Medical History: Past Medical History:  Diagnosis Date   Acid reflux    Asthma    Diabetes mellitus without complication ([place])    Hypertension      Past Surgical History: History reviewed. No pertinent surgical history.   Family History: Family History  Problem Relation Age of Onset   Breast cancer Neg Hx      Social History: Social History   Socioeconomic History   Marital status: Single    Spouse name: Not on file   Number of children: Not on file   Years of education: Not on file   Highest education level: Not on file  Occupational History   Not on file  Tobacco Use   Smoking status: Never Smoker   Smokeless tobacco: Never Used  Substance and Sexual Activity   Alcohol use: Never   Drug use: Never   Sexual activity: Not Currently  Other Topics Concern   Not on file  Social History Narrative   Not on file   Social Determinants of Health   Financial Resource Strain:    Difficulty of Paying Living Expenses: Not on file  Food Insecurity:    Worried About [place] in the Last Year: Not on file   Ran Out of Food in the Last Year: Not on file  Transportation Needs:    Lack of Transportation (Medical): Not on file   Lack of Transportation (Non-Medical): Not on file  Physical Activity:    Days of Exercise per Week: Not on file   Minutes of Exercise per Session: Not on  file  Stress:    Feeling of Stress : Not on file  Social Connections:    Frequency of Communication with Friends and Family: Not on file   Frequency of Social Gatherings  with Friends and Family: Not on file   Attends Religious Services: Not on file   Active Member of Clubs or Organizations: Not on file   Attends Archivist Meetings: Not on file   Marital Status: Not on file  Intimate Partner Violence:    Fear of Current or Ex-Partner: Not on file   Emotionally Abused: Not on file   Physically Abused: Not on file   Sexually Abused: Not on file     Review of Systems: Gen:  HEENT:  CV: Denies chest pain Resp: Denies SOB GI: (+) 5/10 abdominal pain GU : Foley in place MS: SCDs in place. No  Derm:  Psych: Heme:  Neuro:  Endocrine  Vital Signs: Blood pressure 131/74, pulse 80, temperature 98.5 F (36.9 C), temperature source Oral, resp. rate 16, height 4\' 9"  (1.448 m), weight 98 kg, SpO2 93 %.   Intake/Output Summary (Last 24 hours) at 02/10/2020 1012 Last data filed at 02/10/2020 0630 Gross per 24 hour  Intake 3573.53 ml  Output 2690 ml  Net 883.53 ml    Weight trends: Filed Weights   02/09/20 0911 02/09/20 1128  Weight: 98 kg 98 kg    Physical Exam: General:  The patient is alert, awake, in no acute distress. Resting comfortably.  HEENT  Normocephalic/atraumatic. NG tube in place.   Lungs:  Respiratory effort normal. No wheezing, rales or rhonchi.  Heart::  Normal sinus rhythm with no murmurs, rubs, or gallops.  Abdomen:  JP drain in place, surgical dressings clean/dry/intact. No abdominal tenderness to palpation. Bowel sounds active.  Extremities:  SCDs in place. Trace peripheral edema over bilateral feet  Neurologic:  Alert, oriented to person, place, time, and situation  Skin:  Warm, dry, no lesions  Foley:  Draining clear yellow urine    Lab results: Basic Metabolic Panel: Recent Labs  Lab 02/09/20 0914 02/10/20 0556  NA 137 140  K 3.4*  3.6  CL 90* 102  CO2 24 23  GLUCOSE 274* 118*  BUN 74* 79*  CREATININE 6.44* 6.92*  CALCIUM 9.9 7.8*  MG  --  2.1  PHOS  --  9.3*    Liver Function Tests: Recent Labs  Lab 02/10/20 0556  AST 21  ALT 15  ALKPHOS 67  BILITOT 0.8  PROT 6.1*  ALBUMIN 2.2*   Recent Labs  Lab 02/09/20 0931  LIPASE 19   No results for input(s): AMMONIA in the last 168 hours.  CBC: Recent Labs  Lab 02/09/20 0914 02/10/20 0556  WBC 26.1* 12.8*  NEUTROABS 21.0*  --   HGB 13.9 10.9*  HCT 41.9 33.5*  MCV 89.3 90.8  PLT 419* 304    Cardiac Enzymes: No results for input(s): CKTOTAL, TROPONINI in the last 168 hours.  BNP: Invalid input(s): POCBNP  CBG: Recent Labs  Lab 02/09/20 1731 02/09/20 2010 02/09/20 2359 02/10/20 0511 02/10/20 0818  GLUCAP 142* 140* 139* 118* 103*    Microbiology: Recent Results (from the past 720 hour(s))  Respiratory Panel by RT PCR (Flu A&B, Covid) - Nasopharyngeal Swab     Status: None   Collection Time: 02/09/20  9:55 AM   Specimen: Nasopharyngeal Swab  Result Value Ref Range Status   SARS Coronavirus 2 by RT PCR NEGATIVE NEGATIVE Final    Comment: (NOTE) SARS-CoV-2 target nucleic acids are NOT DETECTED. The SARS-CoV-2 RNA is generally detectable in upper respiratoy specimens during the acute phase of infection. The lowest concentration of SARS-CoV-2 viral copies this assay can detect is  131 copies/mL. A negative result does not preclude SARS-Cov-2 infection and should not be used as the sole basis for treatment or other patient management decisions. A negative result may occur with  improper specimen collection/handling, submission of specimen other than nasopharyngeal swab, presence of viral mutation(s) within the areas targeted by this assay, and inadequate number of viral copies (<131 copies/mL). A negative result must be combined with clinical observations, patient history, and epidemiological information. The expected result is  Negative. Fact Sheet for Patients:  PinkCheek.be Fact Sheet for Healthcare Providers:  GravelBags.it This test is not yet ap proved or cleared by the [place] FDA and  has been authorized for detection and/or diagnosis of SARS-CoV-2 by FDA under an Emergency Use Authorization (EUA). This EUA will remain  in effect (meaning this test can be used) for the duration of the COVID-19 declaration under Section 564(b)(1) of the Act, 21 U.S.C. section 360bbb-3(b)(1), unless the authorization is terminated or revoked sooner.    Influenza A by PCR NEGATIVE NEGATIVE Final   Influenza B by PCR NEGATIVE NEGATIVE Final    Comment: (NOTE) The Xpert Xpress SARS-CoV-2/FLU/RSV assay is intended as an aid in  the diagnosis of influenza from Nasopharyngeal swab specimens and  should not be used as a sole basis for treatment. Nasal washings and  aspirates are unacceptable for Xpert Xpress SARS-CoV-2/FLU/RSV  testing. Fact Sheet for Patients: PinkCheek.be Fact Sheet for Healthcare Providers: GravelBags.it This test is not yet approved or cleared by the [place] FDA and  has been authorized for detection and/or diagnosis of SARS-CoV-2 by  FDA under an Emergency Use Authorization (EUA). This EUA will remain  in effect (meaning this test can be used) for the duration of the  Covid-19 declaration under Section 564(b)(1) of the Act, 21  U.S.C. section 360bbb-3(b)(1), unless the authorization is  terminated or revoked. Performed at [facility], [address] [place], [place] [address]      Coagulation Studies: No results for input(s): LABPROT, INR in the last 72 hours.  Urinalysis: No results for input(s): COLORURINE, LABSPEC, PHURINE, GLUCOSEU, HGBUR, BILIRUBINUR, KETONESUR, PROTEINUR, UROBILINOGEN, NITRITE, LEUKOCYTESUR in the last 72 hours.  Invalid input(s):  APPERANCEUR      Imaging: X-ray chest PA or AP  Result Date: 02/09/2020 CLINICAL DATA:  Increasing shortness of breath EXAM: CHEST  1 VIEW COMPARISON:  02/09/2020 FINDINGS: Cardiac shadow is mildly prominent but stable from the prior exam. Aortic calcifications are seen. Gastric catheter extends into the stomach. Mild bibasilar atelectatic changes are seen. The overall inspiratory effort is poor. IMPRESSION: Mild bibasilar atelectasis. Electronically Signed   By: Inez Catalina M.D.   On: 02/09/2020 16:21   DG Chest Port 1 View  Result Date: 02/09/2020 CLINICAL DATA:  Shortness of breath, weakness and constipation for the past 4 days. Nausea and vomiting. Abdominal hernia which is firm to palpation. EXAM: PORTABLE CHEST 1 VIEW COMPARISON:  04/22/2013 FINDINGS: Poor inspiration. Interval borderline enlarged cardiac silhouette. Interval mild linear density at both lung bases. Otherwise, clear lungs. Aortic arch calcifications. Thoracic spine and bilateral shoulder degenerative changes. IMPRESSION: 1. Interval borderline cardiomegaly and mild bibasilar linear atelectasis or scarring. 2. Aortic atherosclerosis. Electronically Signed   By: Claudie Revering M.D.   On: 02/09/2020 09:55     Assessment & Plan: Pt is a 59 y.o. African American female with diabetes mellitus type II, hypertension, asthma, GERD was admitted on 02/09/2020 with Incarcerated umbilical hernia 0000000 SOB (shortness of breath) [R06.02] s/p strangulated ventral hernia  repair, small bowel resection, debridement and excision of skin and subcutaneous tissue on 02/09/20. Nephrology is consulted for acute kidney injury. .  1. Acute kidney injury on CKD stage III (per Care Everywhere). --Cr 6.44-->6.92 today, BUN 79. Suspect renal hypoperfusion as cause. --Chart review shows the patient has been diagnosed with CKD Stage III, likely diabetic nephropathy and was referred to a nephrologist.  Last pertinent labs available in the chart are from 2014  when Cr was 1.08 and GFR>60.   --Recommend discontinuing home Lasix. On both HCTZ and Lasix at home but patient is unsure why she was on Lasix. May also consider discontinuing Januvia as it can cause acute renal failure. --Order renal US and UA to rule out intrinsic or postrenal causes of AKI. --Continue IV fluids --Monitor I/O's  2. Hypoalbuminemia --Abumin 2.2 today, down from 3.3 yesterday. Will check UA.  3. Hyperphosphatemia --Check PTH. Consider adding phosphorus binder once she is off NPO.    LOS: [place] 2/10/202110:12 AM    Note: This note was prepared with Dragon dictation. Any transcription errors are unintentional

## 2020-02-10 NOTE — Consult Note (Signed)
[facility] [facility] [place] NAME: Brianna [name]    MR#:  [id number]  DATE OF BIRTH:  Apr 11, 1961  DATE OF ADMISSION:  02/09/2020  PRIMARY CARE PHYSICIAN: Donnie Coffin, MD   REQUESTING/REFERRING PHYSICIAN: Dr. Hampton Abbot  CHIEF COMPLAINT:   Management of medical problems. Patient is postop day one surgery for obstructed ventral hernia  HISTORY OF PRESENT ILLNESS:  Brianna [name]  is a 59 y.o. female with a known history of type II diabetes without complication on insulin, hypertension, Gerd the emergency room with severe abdominal pain around their umbilicus, constipation, vomiting with nausea for 2 to 3 days. Workup in the ER showed patient had possible incarcerated umbilical hernia. She was found to have white count of 26,000. BUN of 74 creatinine of 6.4 (baseline 1.1 in 2014) patient was taken to the OR underwent string related ventral hernia repair, small bowel resection, debridement and excision of skin and subcutaneous tissue.  Internal medicine was consulted for management of diabetes and hypertension. Nephrology has been consulted for AKI.  PAST MEDICAL HISTORY:   Past Medical History:  Diagnosis Date  . Acid reflux   . Asthma   . Diabetes mellitus without complication ([place])   . Hypertension     PAST SURGICAL HISTOIRY:   Past Surgical History:  Procedure Laterality Date  . BOWEL RESECTION  02/09/2020   Procedure: SMALL BOWEL RESECTION;  Surgeon: Olean Ree, MD;  Location: [facility] ORS;  Service: General;;  . LAPAROTOMY N/A 02/09/2020   Procedure: EXPLORATORY LAPAROTOMY;  Surgeon: Olean Ree, MD;  Location: [facility] ORS;  Service: General;  Laterality: N/A;    SOCIAL HISTORY:   Social History   Tobacco Use  . Smoking status: Never Smoker  . Smokeless tobacco: Never Used  Substance Use Topics  . Alcohol use: Never    FAMILY HISTORY:   Family History  Problem Relation Age of Onset  . Breast cancer Neg Hx     DRUG ALLERGIES:    Allergies  Allergen Reactions  . Ace Inhibitors Anaphylaxis  . Lisinopril Anaphylaxis  . Lipitor [Atorvastatin] Other (See Comments)    Muscle aches    REVIEW OF SYSTEMS:   Review of Systems  Constitutional: Negative for chills, fever and weight loss.  HENT: Negative for ear discharge, ear pain and nosebleeds.   Eyes: Negative for blurred vision, pain and discharge.  Respiratory: Negative for sputum production, shortness of breath, wheezing and stridor.   Cardiovascular: Negative for chest pain, palpitations, orthopnea and PND.  Gastrointestinal: Positive for abdominal pain. Negative for diarrhea, nausea and vomiting.  Genitourinary: Negative for frequency and urgency.  Musculoskeletal: Negative for back pain and joint pain.  Neurological: Positive for weakness. Negative for sensory change, speech change and focal weakness.  Psychiatric/Behavioral: Negative for depression and hallucinations. The patient is not nervous/anxious.      MEDICATIONS AT HOME:   Prior to Admission medications   Medication Sig Start Date End Date Taking? Authorizing Provider  albuterol (PROVENTIL) (2.5 MG/3ML) 0.083% nebulizer solution Take 2.5 mg by nebulization every 4 (four) hours as needed for shortness of breath or wheezing. 12/21/19  Yes [provider]  allopurinol (ZYLOPRIM) 300 MG tablet Take 300 mg by mouth daily. 12/21/19  Yes [provider]  amLODipine (NORVASC) 10 MG tablet Take 10 mg by mouth daily. 12/21/19  Yes [provider]  ASPIRIN ADULT LOW STRENGTH 81 MG EC tablet Take 81 mg by mouth daily. 12/21/19  Yes [provider]  carvedilol (  COREG) 6.25 MG tablet Take 6.25 mg by mouth 2 (two) times daily. 12/21/19  Yes [provider]  cetirizine (ZYRTEC) 10 MG tablet Take 10 mg by mouth daily. 09/18/19  Yes [provider]  furosemide (LASIX) 40 MG tablet Take 40 mg by mouth daily. 12/21/19  Yes [provider]  HUMULIN N 100  UNIT/ML injection Inject 25 Units into the skin 2 (two) times daily. 12/21/19  Yes [provider]  hydrochlorothiazide (HYDRODIURIL) 25 MG tablet Take 25 mg by mouth daily. 12/21/19  Yes [provider]  JANUVIA 100 MG tablet Take 100 mg by mouth daily. 12/21/19  Yes [provider]  omeprazole (PRILOSEC) 20 MG capsule Take 20 mg by mouth daily. 12/21/19  Yes [provider]  PROVENTIL HFA 108 (90 Base) MCG/ACT inhaler Inhale 2 puffs into the lungs 4 (four) times daily as needed for shortness of breath or wheezing. 12/21/19  Yes [provider]      VITAL SIGNS:  Blood pressure 130/76, pulse 80, temperature 98.4 F (36.9 C), temperature source Oral, resp. rate 20, height 4\' 9"  (1.448 m), weight 98 kg, SpO2 93 %.  PHYSICAL EXAMINATION:  GENERAL:  59 y.o.-year-old patient lying in the bed with no acute distress. Obese EYES: Pupils equal, round, reactive to light and accommodation. No scleral icterus.  HEENT: Head atraumatic, normocephalic. Oropharynx and nasopharynx clear. NG+ NECK:  Supple, no jugular venous distention. No thyroid enlargement, no tenderness.  LUNGS: Normal breath sounds bilaterally, no wheezing, rales,rhonchi or crepitation. No use of accessory muscles of respiration.  CARDIOVASCULAR: S1, S2 normal. No murmurs, rubs, or gallops.  ABDOMEN: Soft, nontender, nondistended. Bowel sounds present. No organomegaly or mass. Surgical dressing midline with JP drain EXTREMITIES: No pedal edema, cyanosis, or clubbing.  NEUROLOGIC: Cranial nerves II through XII are intact. Muscle strength 5/5 in all extremities. Sensation intact. Gait not checked.  PSYCHIATRIC:patient is alert and oriented x 3.  SKIN: No obvious rash, lesion, or ulcer.   LABORATORY PANEL:   CBC Recent Labs  Lab 02/10/20 0556  WBC 12.8*  HGB 10.9*  HCT 33.5*  PLT 304    ------------------------------------------------------------------------------------------------------------------  Chemistries  Recent Labs  Lab 02/10/20 0556  NA 140  K 3.6  CL 102  CO2 23  GLUCOSE 118*  BUN 79*  CREATININE 6.92*  CALCIUM 7.8*  MG 2.1  AST 21  ALT 15  ALKPHOS 67  BILITOT 0.8   ------------------------------------------------------------------------------------------------------------------  Cardiac Enzymes No results for input(s): TROPONINI in the last 168 hours. ------------------------------------------------------------------------------------------------------------------  RADIOLOGY:  X-ray chest PA or AP  Result Date: 02/09/2020 CLINICAL DATA:  Increasing shortness of breath EXAM: CHEST  1 VIEW COMPARISON:  02/09/2020 FINDINGS: Cardiac shadow is mildly prominent but stable from the prior exam. Aortic calcifications are seen. Gastric catheter extends into the stomach. Mild bibasilar atelectatic changes are seen. The overall inspiratory effort is poor. IMPRESSION: Mild bibasilar atelectasis. Electronically Signed   By: Inez Catalina M.D.   On: 02/09/2020 16:21   DG Chest Port 1 View  Result Date: 02/09/2020 CLINICAL DATA:  Shortness of breath, weakness and constipation for the past 4 days. Nausea and vomiting. Abdominal hernia which is firm to palpation. EXAM: PORTABLE CHEST 1 VIEW COMPARISON:  04/22/2013 FINDINGS: Poor inspiration. Interval borderline enlarged cardiac silhouette. Interval mild linear density at both lung bases. Otherwise, clear lungs. Aortic arch calcifications. Thoracic spine and bilateral shoulder degenerative changes. IMPRESSION: 1. Interval borderline cardiomegaly and mild bibasilar linear atelectasis or scarring. 2. Aortic atherosclerosis. Electronically  Signed   By: Claudie Revering M.D.   On: 02/09/2020 09:55    EKG:   Orders placed or performed during the hospital encounter of 02/09/20  . ED EKG  . ED EKG  . EKG 12-Lead  . EKG  12-Lead    IMPRESSION AND PLAN:   Brianna [name]  is a 59 y.o. female with a known history of type II diabetes without complication on insulin, hypertension, Gerd the emergency room with severe abdominal pain around their umbilicus, constipation, vomiting with nausea for 2 to 3 days. Workup in the ER showed patient had possible incarcerated umbilical hernia.  1. Type II diabetes without complication -patient is NPO -will keep on low-dose sliding scale-- advance insulin dosing according to patient's diet and PO intake -sugars are stable -check A1c  2. Acute renal failure appears prerenal azotemia given G.I. losses and acute illness with incarcerated hernia -nephrology consultation has been requested -continue IV fluids -baseline creatinine 1.14-- 2014 -creatinine of 6.4-- 6.9 -input output -avoid nephrotoxic agents -renal ultrasound if needed  3. Hypertension -blood pressure stable -PRN hydralazine and metoprolol -patient has NG tube-- once able to take oral will introduce BP meds  4. Incarcerated ventral hernia status post surgery day one -continue antibiotics per surgical recommendation  5.history of asthma -PRN oxygen -sats stable. Patient not in respiratory distress  6. DVT prophylaxis subcu heparin  All the records are reviewed and case discussed with Consulting provider. Management plans discussed with the patient, family and they are in agreement.  CODE STATUS: full  TOTAL TIME TAKING CARE OF THIS PATIENT: *45 minutes.    [name] M.D  [facility]    CC: Primary care Physician: Donnie Coffin, MD

## 2020-02-10 NOTE — Anesthesia Postprocedure Evaluation (Signed)
Anesthesia Post Note  Patient: Brianna [name]  Procedure(s) Performed: EXPLORATORY LAPAROTOMY (N/A ) SMALL BOWEL RESECTION  Patient location during evaluation: PACU Anesthesia Type: General Level of consciousness: awake and alert Pain management: pain level controlled Vital Signs Assessment: post-procedure vital signs reviewed and stable Respiratory status: spontaneous breathing, nonlabored ventilation, respiratory function stable and patient connected to face mask oxygen Cardiovascular status: blood pressure returned to baseline and stable Postop Assessment: no apparent nausea or vomiting Anesthetic complications: no     Last Vitals:  Vitals:   02/09/20 1933 02/10/20 0435  BP: 106/72 131/74  Pulse: 81 80  Resp: (!) 22 16  Temp: 36.8 C 36.9 C  SpO2: 94% 93%    Last Pain:  Vitals:   02/10/20 0548  TempSrc:   PainSc: Asleep                 Tera Mater

## 2020-02-10 NOTE — Anesthesia Preprocedure Evaluation (Signed)
Anesthesia Evaluation  Patient identified by MRN, date of birth, ID band Patient awake    Reviewed: Allergy & Precautions, H&P , NPO status , Patient's Chart, lab work & pertinent test results  Airway Mallampati: III  TM Distance: >3 FB Neck ROM: full    Dental  (+) Chipped, Missing   Pulmonary asthma ,           Cardiovascular hypertension,      Neuro/Psych negative neurological ROS  negative psych ROS   GI/Hepatic Neg liver ROS, GERD  ,Incarcerated hernia   Endo/Other  negative endocrine ROSdiabetes  Renal/GU      Musculoskeletal   Abdominal   Peds  Hematology negative hematology ROS (+)   Anesthesia Other Findings Past Medical History: No date: Acid reflux No date: Asthma No date: Diabetes mellitus without complication (HCC) No date: Hypertension  History reviewed. No pertinent surgical history.  BMI    Body Mass Index: 46.74 kg/m      Reproductive/Obstetrics negative OB ROS                             Anesthesia Physical Anesthesia Plan  ASA: III and emergent  Anesthesia Plan: General ETT, Rapid Sequence and Cricoid Pressure   Post-op Pain Management:    Induction:   PONV Risk Score and Plan: Ondansetron, Dexamethasone and Treatment may vary due to age or medical condition  Airway Management Planned:   Additional Equipment:   Intra-op Plan:   Post-operative Plan:   Informed Consent: I have reviewed the patients History and Physical, chart, labs and discussed the procedure including the risks, benefits and alternatives for the proposed anesthesia with the patient or authorized representative who has indicated his/her understanding and acceptance.     Dental Advisory Given  Plan Discussed with: Anesthesiologist  Anesthesia Plan Comments:         Anesthesia Quick Evaluation

## 2020-02-11 LAB — BASIC METABOLIC PANEL
Anion gap: 16 — ABNORMAL HIGH (ref 5–15)
BUN: 87 mg/dL — ABNORMAL HIGH (ref 6–20)
CO2: 23 mmol/L (ref 22–32)
Calcium: 8.6 mg/dL — ABNORMAL LOW (ref 8.9–10.3)
Chloride: 106 mmol/L (ref 98–111)
Creatinine, Ser: 5.85 mg/dL — ABNORMAL HIGH (ref 0.44–1.00)
GFR calc Af Amer: 8 mL/min — ABNORMAL LOW (ref >60)
GFR calc non Af Amer: 7 mL/min — ABNORMAL LOW (ref >60)
Glucose, Bld: 101 mg/dL — ABNORMAL HIGH (ref 70–99)
Potassium: 3.5 mmol/L (ref 3.5–5.1)
Sodium: 145 mmol/L (ref 135–145)

## 2020-02-11 LAB — GLUCOSE, CAPILLARY
Glucose-Capillary: 92 mg/dL (ref 70–99)
Glucose-Capillary: 90 mg/dL (ref 70–99)
Glucose-Capillary: 147 mg/dL — ABNORMAL HIGH (ref 70–99)
Glucose-Capillary: 137 mg/dL — ABNORMAL HIGH (ref 70–99)

## 2020-02-11 LAB — CBC
HCT: 31.1 % — ABNORMAL LOW (ref 36.0–46.0)
Hemoglobin: 10.1 g/dL — ABNORMAL LOW (ref 12.0–15.0)
MCH: 29.8 pg (ref 26.0–34.0)
MCHC: 32.5 g/dL (ref 30.0–36.0)
MCV: 91.7 fL (ref 80.0–100.0)
Platelets: 321 10*3/uL (ref 150–400)
RBC: 3.39 MIL/uL — ABNORMAL LOW (ref 3.87–5.11)
RDW: 13.9 % (ref 11.5–15.5)
WBC: 11.2 10*3/uL — ABNORMAL HIGH (ref 4.0–10.5)
nRBC: 0 % (ref 0.0–0.2)

## 2020-02-11 LAB — MAGNESIUM: Magnesium: 2.3 mg/dL (ref 1.7–2.4)

## 2020-02-11 LAB — PHOSPHORUS: Phosphorus: 8.7 mg/dL — ABNORMAL HIGH (ref 2.5–4.6)

## 2020-02-11 LAB — SURGICAL PATHOLOGY

## 2020-02-11 NOTE — Progress Notes (Signed)
Edison Simon, PA ordered for patient's NG tube to be removed if residuals were less than 150 mL. Patients residuals were 50 mL. I removed her NG tube.

## 2020-02-11 NOTE — Progress Notes (Signed)
[place] [facility] [place] NAME: Brianna [name]    MR#:  [id number]  DATE OF BIRTH:  [birth year]  SUBJECTIVE:   Patient appears to be in good spirits. NG tube has been clamped. She is hoping it will come out. She is getting out of bed to the chair. REVIEW OF SYSTEMS:   Review of Systems  Constitutional: Negative for chills, fever and weight loss.  HENT: Negative for ear discharge, ear pain and nosebleeds.   Eyes: Negative for blurred vision, pain and discharge.  Respiratory: Negative for sputum production, shortness of breath, wheezing and stridor.   Cardiovascular: Negative for chest pain, palpitations, orthopnea and PND.  Gastrointestinal: Positive for abdominal pain. Negative for diarrhea, nausea and vomiting.  Genitourinary: Negative for frequency and urgency.  Musculoskeletal: Negative for back pain and joint pain.  Neurological: Negative for sensory change, speech change, focal weakness and weakness.  Psychiatric/Behavioral: Negative for depression and hallucinations. The patient is not nervous/anxious.    Tolerating Diet: Has NG+ Tolerating PT: ambulatory  DRUG ALLERGIES:   Allergies  Allergen Reactions  . Ace Inhibitors Anaphylaxis  . Lisinopril Anaphylaxis  . Lipitor [Atorvastatin] Other (See Comments)    Muscle aches    VITALS:  Blood pressure 137/70, pulse 80, temperature 98.3 F (36.8 C), temperature source Oral, resp. rate 20, height 4\' 9"  (1.448 m), weight 98 kg, SpO2 94 %.  PHYSICAL EXAMINATION:   Physical Exam  GENERAL:  59 y.o.-year-old patient lying in the bed with no acute distress.  EYES: Pupils equal, round, reactive to light and accommodation. No scleral icterus.   HEENT: Head atraumatic, normocephalic. Oropharynx and nasopharynx clear. NG+ NECK:  Supple, no jugular venous distention. No thyroid enlargement, no tenderness.  LUNGS: Normal breath sounds bilaterally, no wheezing, rales, rhonchi. No use of accessory  muscles of respiration.  CARDIOVASCULAR: S1, S2 normal. No murmurs, rubs, or gallops.  ABDOMEN: Soft, nontender,+distended. Few Bowel sounds present. No organomegaly or mass. Honeycomb dressing+, JP drain+ EXTREMITIES: No cyanosis, clubbing or edema b/l.    NEUROLOGIC: Cranial nerves II through XII are intact. No focal Motor or sensory deficits b/l.   PSYCHIATRIC:  patient is alert and oriented x 3.  SKIN: No obvious rash, lesion, or ulcer.   LABORATORY PANEL:  CBC Recent Labs  Lab 02/11/20 0241  WBC 11.2*  HGB 10.1*  HCT 31.1*  PLT 321    Chemistries  Recent Labs  Lab 02/10/20 0556 02/10/20 0556 02/11/20 0241  NA 140   < > 145  K 3.6   < > 3.5  CL 102   < > 106  CO2 23   < > 23  GLUCOSE 118*   < > 101*  BUN 79*   < > 87*  CREATININE 6.92*   < > 5.85*  CALCIUM 7.8*   < > 8.6*  MG 2.1   < > 2.3  AST 21  --   --   ALT 15  --   --   ALKPHOS 67  --   --   BILITOT 0.8  --   --    < > = values in this interval not displayed.   Cardiac Enzymes No results for input(s): TROPONINI in the last 168 hours. RADIOLOGY:  X-ray chest PA or AP  Result Date: 02/09/2020 CLINICAL DATA:  Increasing shortness of breath EXAM: CHEST  1 VIEW COMPARISON:  02/09/2020 FINDINGS: Cardiac shadow is mildly prominent but stable from the prior exam. Aortic calcifications  are seen. Gastric catheter extends into the stomach. Mild bibasilar atelectatic changes are seen. The overall inspiratory effort is poor. IMPRESSION: Mild bibasilar atelectasis. Electronically Signed   By: Inez Catalina M.D.   On: 02/09/2020 16:21   ASSESSMENT AND PLAN:  Brianna [name]  is a 59 y.o. female with a known history of type II diabetes without complication on insulin, hypertension, Gerd the emergency room with severe abdominal pain around their umbilicus, constipation, vomiting with nausea for 2 to 3 days. Workup in the ER showed patient had possible incarcerated umbilical hernia.  1. Type II diabetes without complication,  controlled -patient is NPO -will keep on low-dose sliding scale-- advance insulin dosing according to patient's diet and PO intake -sugars are stable - A1c 7.2  2. Acute renal failure appears prerenal azotemia given G.I. losses and acute illness with incarcerated hernia -nephrology consultation with Dr Candiss Norse noted -continue IV fluids -baseline creatinine 1.14 (2014) -creatinine of 6.4-- 6.9--58 -UOP about 1100 -avoid nephrotoxic agents  3. Hypertension -blood pressure stable -PRN hydralazine and metoprolol -patient has NG tube-- once able to take oral will introduce BP meds  4. Incarcerated ventral hernia status post surgery day #2 -continue antibiotics per surgical recommendation -NG clamped  5.history of asthma -PRN oxygen -sats stable. Patient not in respiratory distress  6. DVT prophylaxis subcu heparin   Procedures: s/p ventral hernia surgery Family communication :patient CODE STATUS: FULL DVT Prophylaxis :Heparin   TOTAL TIME TAKING CARE OF THIS PATIENT: *25* minutes.  >50% time spent on counselling and coordination of care  Note: This dictation was prepared with Dragon dictation along with smaller phrase technology. Any transcriptional errors that result from this process are unintentional.  [name] M.D    [facility]   CC: Primary care physician; Donnie Coffin, MDPatient ID: Brianna [name], female   DOB: 1961/07/13, 59 y.o.   MRN: [id number]

## 2020-02-11 NOTE — Progress Notes (Signed)
Subjective: Interval History: The patient is feeling well overall, denies any abdominal pain at this time. She is looking forward to getting her NG tube removed.  Cr improved, 6.92-->5.85.  Foley catheter in place, urine output 957mL yesterday.   Objective: Vital signs in last 24 hours: Temp:  [98.3 F (36.8 C)-98.7 F (37.1 C)] 98.7 F (37.1 C) (02/11 1208) Pulse Rate:  [80-84] 84 (02/11 1208) Resp:  [18-20] 20 (02/11 0558) BP: (120-140)/(70-73) 140/73 (02/11 1208) SpO2:  [94 %] 94 % (02/11 1208) Weight change:   Intake/Output from previous day: 02/10 0701 - 02/11 0700 In: 2387.6 [I.V.:2103.1; IV Piggyback:284.5] Out: 77 [Urine:950; Emesis/NG output:100; Drains:20] Intake/Output this shift: Total I/O In: 1259.3 [I.V.:1209.3; IV Piggyback:50] Out: 500 [Urine:500]  Physical Exam General: Alert, awake, appears comfortable watching TV HEENT: Head: normocephalic, atraumatic. Eyes: conjunctivae clear. Nose: NG tube in place. Respiratory: Normal respiratory effort, lungs clear to auscultation bilaterally. Cardiac: Regular rate and rhythm, no murmurs, rubs, gallops Abdomen: Mild epigastric tenderness to palpation. Blake drain in place, honeycomb dressing clean, dry, intact. GU: Foley catheter in place Extremities: Trace edema over bilateral feet, SCDs in place. Skin: Warm, dry Neuro: Alert and oriented x4 Psych: Normal mood and affect   Lab Results: Recent Labs    02/10/20 0556 02/11/20 0241  WBC 12.8* 11.2*  HGB 10.9* 10.1*  HCT 33.5* 31.1*  PLT 304 321   BMET:  Recent Labs    02/10/20 0556 02/11/20 0241  NA 140 145  K 3.6 3.5  CL 102 106  CO2 23 23  GLUCOSE 118* 101*  BUN 79* 87*  CREATININE 6.92* 5.85*  CALCIUM 7.8* 8.6*   No results for input(s): PTH in the last 72 hours. Iron Studies: No results for input(s): IRON, TIBC, TRANSFERRIN, FERRITIN in the last 72 hours.  Studies/Results: X-ray chest PA or AP  Result Date: 02/09/2020 CLINICAL DATA:   Increasing shortness of breath EXAM: CHEST  1 VIEW COMPARISON:  02/09/2020 FINDINGS: Cardiac shadow is mildly prominent but stable from the prior exam. Aortic calcifications are seen. Gastric catheter extends into the stomach. Mild bibasilar atelectatic changes are seen. The overall inspiratory effort is poor. IMPRESSION: Mild bibasilar atelectasis. Electronically Signed   By: Inez Catalina M.D.   On: 02/09/2020 16:21     Current Facility-Administered Medications (Endocrine & Metabolic):  .  insulin aspart (novoLOG) injection 0-5 Units .  insulin aspart (novoLOG) injection 0-9 Units   Current Facility-Administered Medications (Cardiovascular):  .  hydrALAZINE (APRESOLINE) injection 10 mg .  metoprolol tartrate (LOPRESSOR) injection 5 mg   Current Facility-Administered Medications (Respiratory):  .  albuterol (PROVENTIL) (2.5 MG/3ML) 0.083% nebulizer solution 2.5 mg   Current Facility-Administered Medications (Analgesics):  .  HYDROmorphone (DILAUDID) injection 0.5 mg   Current Facility-Administered Medications (Hematological):  .  heparin injection 5,000 Units   Current Facility-Administered Medications (Other):  .  0.9 %  sodium chloride infusion .  Chlorhexidine Gluconate Cloth 2 % PADS 6 each .  ondansetron (ZOFRAN-ODT) disintegrating tablet 4 mg **OR** ondansetron (ZOFRAN) injection 4 mg   .  pantoprazole (PROTONIX) injection 40 mg .  piperacillin-tazobactam (ZOSYN) IVPB 3.375 g  No current outpatient medications on file.  Assessment/Plan: Pt is a 59 y.o. African American female with diabetes mellitus type II, hypertension, asthma, GERD was admitted on 02/09/2020 with Incarcerated umbilical hernia 0000000 SOB (shortness of breath) [R06.02] s/p strangulated ventral hernia repair, small bowel resection, debridement and excision of skin and subcutaneous tissue on 02/09/20. Nephrology is consulted for acute kidney  injury.  1. Acute kidney injury on CKD stage IIIB --Cr  6.44-->6.92-->5.85 today. BUN 87. --Chart review shows the patient has been diagnosed with CKD Stage IIIB and was referred to a nephrologist.  Labs obtained from [facility] reveal GFR was 42 in June 2020.  --Urinalysis and protein/creatinine ratio pending. --Plan to order renal US once abdominal pain from surgery is well controlled. --Continue IV fluids --Monitor I/O's  2. Hyperphosphatemia --Phosphorus 9.3-->8.7 today. Continue to monitor. Expect this to improve with improving kidney function.     LOS: 2 days   Maryln Gottron 02/11/2020,4:10 PM

## 2020-02-11 NOTE — Progress Notes (Signed)
[facility] Day(s): 2.   Post op day(s): 2 Days Post-Op.   Interval History:  Patient seen and examined no acute events or new complaints overnight.  Patient reports she is having rumbling pains in her lower abdomen. Biggest complaint additionally is NGT discomfort No fever, chills, nausea, or emesis She does report that she is passing flatus "about every 10 minutes" Leukocytosis continues to improved to 11K; remains afebrile Renal function starting to improve, sCr down to 5.85, BUN increased to 87, UO - 950 Hyperphosphatemia improving some; down to 8.7 NGT with 100 ccs out in last 24 hours Surgical drain with 20 ccs out  No new complaints.    Vital signs in last 24 hours: [min-max] current  Temp:  [98.3 F (36.8 C)-98.5 F (36.9 C)] 98.3 F (36.8 C) (02/11 0558) Pulse Rate:  [80] 80 (02/11 0558) Resp:  [18-20] 20 (02/11 0558) BP: (120-137)/(70-76) 137/70 (02/11 0558) SpO2:  [93 %-94 %] 94 % (02/11 0558)     Height: 4\' 9"  (144.8 cm) Weight: 98 kg BMI (Calculated): 46.73   Intake/Output last 2 shifts:  02/10 0701 - 02/11 0700 In: 2387.6 [I.V.:2103.1; IV Piggyback:284.5] Out: 20 [Urine:950; Emesis/NG output:100; Drains:20]   Physical Exam:  Constitutional: alert, cooperative and no distress  HEENT: NGT in place Respiratory: breathing non-labored at rest  Cardiovascular: regular rate and sinus rhythm  Gastrointestinal: soft, incisional soreness, and non-distended. No rebound/guarding. Surgical drain to the left of midline with serosanguinous output  Integumentary: Laparotomy incision is CDI with honeycomb in place, no erythema or drainage appreciable    Labs:  CBC Latest Ref Rng & Units 02/11/2020 02/10/2020 02/09/2020  WBC 4.0 - 10.5 K/uL 11.2(H) 12.8(H) 26.1(H)  Hemoglobin 12.0 - 15.0 g/dL 10.1(L) 10.9(L) 13.9  Hematocrit 36.0 - 46.0 % 31.1(L) 33.5(L) 41.9  Platelets 150 - 400 K/uL 321 304 419(H)   CMP Latest Ref Rng &  Units 02/11/2020 02/10/2020 02/09/2020  Glucose 70 - 99 mg/dL 101(H) 118(H) 274(H)  BUN 6 - 20 mg/dL 87(H) 79(H) 74(H)  Creatinine 0.44 - 1.00 mg/dL 5.85(H) 6.92(H) 6.44(H)  Sodium 135 - 145 mmol/L 145 140 137  Potassium 3.5 - 5.1 mmol/L 3.5 3.6 3.4(L)  Chloride 98 - 111 mmol/L 106 102 90(L)  CO2 22 - 32 mmol/L 23 23 24   Calcium 8.9 - 10.3 mg/dL 8.6(L) 7.8(L) 9.9  Total Protein 6.5 - 8.1 g/dL - 6.1(L) 9.1(H)  Total Bilirubin 0.3 - 1.2 mg/dL - 0.8 0.9  Alkaline Phos 38 - 126 U/L - 67 111  AST 15 - 41 U/L - 21 19  ALT 0 - 44 U/L - 15 17     Imaging studies: No new pertinent imaging studies   Assessment/Plan:  59 y.o. female overall improving with return of bowel fucntion 2 Days Post-Op s/p exploratory laparotomy, strangulated ventral hernia repair, small bowel resection, and excision of subcutaneous tissue for strangulated ventral hernia.   - Continue NPO + IVF resuscitation              - Will preform NGT clamping trial this morning given reports of flatus and low NGT output; check residuals after 4 hours, if less than 150 ccs will remove NGT             - Continue IV ABx (Zosyn)             - Pain control prn; antiemetics prn             -  Monitor abdominal examination; on-going bowel function              - Continue foley today; trend renal function; monitor U/O; appreciate nephrology assistance             - Trend leukocytosis; electrolytes             - Mobilization encouraged             - Medical management of comorbidities; appreciate hospitalist assistance  All of the above findings and recommendations were discussed with the patient, and the medical team, and all of patient's questions were answered to her expressed satisfaction.  -- Edison Simon, PA-C [facility] 02/11/2020, 7:21 AM [phone] M-F: 7am - 4pm

## 2020-02-12 LAB — BASIC METABOLIC PANEL WITH GFR
Anion gap: 12 (ref 5–15)
BUN: 72 mg/dL — ABNORMAL HIGH (ref 6–20)
CO2: 22 mmol/L (ref 22–32)
Calcium: 8.9 mg/dL (ref 8.9–10.3)
Chloride: 108 mmol/L (ref 98–111)
Creatinine, Ser: 3.69 mg/dL — ABNORMAL HIGH (ref 0.44–1.00)
GFR calc Af Amer: 15 mL/min — ABNORMAL LOW
GFR calc non Af Amer: 13 mL/min — ABNORMAL LOW
Glucose, Bld: 169 mg/dL — ABNORMAL HIGH (ref 70–99)
Potassium: 3 mmol/L — ABNORMAL LOW (ref 3.5–5.1)
Sodium: 142 mmol/L (ref 135–145)

## 2020-02-12 LAB — GLUCOSE, CAPILLARY
Glucose-Capillary: 158 mg/dL — ABNORMAL HIGH (ref 70–99)
Glucose-Capillary: 158 mg/dL — ABNORMAL HIGH (ref 70–99)
Glucose-Capillary: 143 mg/dL — ABNORMAL HIGH (ref 70–99)
Glucose-Capillary: 150 mg/dL — ABNORMAL HIGH (ref 70–99)
Glucose-Capillary: 154 mg/dL — ABNORMAL HIGH (ref 70–99)

## 2020-02-12 LAB — CBC
HCT: 30.7 % — ABNORMAL LOW (ref 36.0–46.0)
Hemoglobin: 9.8 g/dL — ABNORMAL LOW (ref 12.0–15.0)
MCH: 29.9 pg (ref 26.0–34.0)
MCHC: 31.9 g/dL (ref 30.0–36.0)
MCV: 93.6 fL (ref 80.0–100.0)
Platelets: 337 10*3/uL (ref 150–400)
RBC: 3.28 MIL/uL — ABNORMAL LOW (ref 3.87–5.11)
RDW: 14.3 % (ref 11.5–15.5)
WBC: 9 10*3/uL (ref 4.0–10.5)
nRBC: 0 % (ref 0.0–0.2)

## 2020-02-12 MED ORDER — AMLODIPINE BESYLATE 10 MG PO TABS
10.0000 mg | ORAL_TABLET | Freq: Every day | ORAL | Status: DC
  Administered 2020-02-12 – 2020-02-14 (×3): 10 mg via ORAL
  Filled 2020-02-12 (×3): qty 1

## 2020-02-12 MED ORDER — CARVEDILOL 6.25 MG PO TABS
6.2500 mg | ORAL_TABLET | Freq: Two times a day (BID) | ORAL | Status: DC
  Administered 2020-02-12 – 2020-02-14 (×5): 6.25 mg via ORAL
  Filled 2020-02-12 (×5): qty 1

## 2020-02-12 MED ORDER — POTASSIUM CHLORIDE CRYS ER 20 MEQ PO TBCR
20.0000 meq | EXTENDED_RELEASE_TABLET | Freq: Once | ORAL | Status: AC
  Administered 2020-02-12: 14:00:00 20 meq via ORAL
  Filled 2020-02-12: qty 1

## 2020-02-12 MED ORDER — PANTOPRAZOLE SODIUM 40 MG PO TBEC: 40.0000 mg | DELAYED_RELEASE_TABLET | Freq: Every day | ORAL | Status: DC

## 2020-02-12 NOTE — Progress Notes (Signed)
[facility] Hospital Day(s): 3.   Post op day(s): 3 Days Post-Op.   Interval History:  Patient seen and examined no acute events or new complaints overnight.  Patient reports she is feeling better this morning Abdominal pain, 3/10, controlled with pain medications No fever, chills, nausea, or emesis NGT removed yesterday and started on CLD; tolerating well and continues to have bowel fucntion Labs pending this morning U/O - 1300 Surgical Drain 30 ccs   Vital signs in last 24 hours: [min-max] current  Temp:  [97.8 F (36.6 C)-98.7 F (37.1 C)] 97.8 F (36.6 C) (02/12 0301) Pulse Rate:  [67-84] 67 (02/12 0301) Resp:  [20] 20 (02/12 0301) BP: (134-153)/(69-84) 153/84 (02/12 0301) SpO2:  [94 %-96 %] 95 % (02/12 0301)     Height: 4\' 9"  (144.8 cm) Weight: 98 kg BMI (Calculated): 46.73   Intake/Output last 2 shifts:  02/11 0701 - 02/12 0700 In: 1259.3 [I.V.:1209.3; IV Piggyback:50] Out: 1330 [Urine:1300; Drains:30]   Physical Exam:  Constitutional: alert, cooperative and no distress Respiratory: breathing non-labored at rest  Cardiovascular: regular rate and sinus rhythm  Gastrointestinal: soft,incisional soreness, and non-distended. No rebound/guarding. Surgical drain to the left of midline with serosanguinous output Integumentary:Laparotomy incision is CDI with honeycomb in place, no erythema or drainage appreciable   Labs:  CBC Latest Ref Rng & Units 02/11/2020 02/10/2020 02/09/2020  WBC 4.0 - 10.5 K/uL 11.2(H) 12.8(H) 26.1(H)  Hemoglobin 12.0 - 15.0 g/dL 10.1(L) 10.9(L) 13.9  Hematocrit 36.0 - 46.0 % 31.1(L) 33.5(L) 41.9  Platelets 150 - 400 K/uL 321 304 419(H)   CMP Latest Ref Rng & Units 02/11/2020 02/10/2020 02/09/2020  Glucose 70 - 99 mg/dL 101(H) 118(H) 274(H)  BUN 6 - 20 mg/dL 87(H) 79(H) 74(H)  Creatinine 0.44 - 1.00 mg/dL 5.85(H) 6.92(H) 6.44(H)  Sodium 135 - 145 mmol/L 145 140 137  Potassium 3.5 - 5.1 mmol/L 3.5 3.6  3.4(L)  Chloride 98 - 111 mmol/L 106 102 90(L)  CO2 22 - 32 mmol/L 23 23 24   Calcium 8.9 - 10.3 mg/dL 8.6(L) 7.8(L) 9.9  Total Protein 6.5 - 8.1 g/dL - 6.1(L) 9.1(H)  Total Bilirubin 0.3 - 1.2 mg/dL - 0.8 0.9  Alkaline Phos 38 - 126 U/L - 67 111  AST 15 - 41 U/L - 21 19  ALT 0 - 44 U/L - 15 17     Imaging studies: No new pertinent imaging studies   Assessment/Plan:  59 y.o. female doing well 3 Days Post-Op s/p exploratory laparotomy, strangulated ventral hernia repair, small bowel resection, and excision of subcutaneous tissuefor strangulated ventral hernia.   - Advance to full liquid diet; if tolerates and continues with bowel function then we can consider soft diet this evening  - Wean IVF (75 ml/hr)  - Continue IV ABx (Zosyn) - Pain control prn; antiemetics prn - Monitor abdominal examination; on-going bowel function - If sCr improving today will discontinue foley; continue to trend renal function; monitor U/O; appreciate nephrology assistance - Trend leukocytosis; electrolytes - Mobilization encouraged; will engage PT - Medical management of comorbidities; appreciate hospitalist assistance   - Discharge Planning; If she continues to clinically improve then hopefully discharge in the next 48 hours  All of the above findings and recommendations were discussed with the patient, and the medical team, and all of patient's questions were answered toherexpressed satisfaction.  -- Edison Simon, PA-C [facility] 02/12/2020, 7:05 AM [phone] M-F: 7am - 4pm

## 2020-02-12 NOTE — Progress Notes (Signed)
Subjective: Interval History: The patient is feeling well today. She is excited to have NG tube out and diet progressing. Tolerating PO intake well so far. She had a BM this morning and walked the halls. She states she is feeling happy and has been joking with the nursing staff and providers.  Creatinine is improving. 5.9 yesterday --> 3.69 today.  Urine output 1300 mL yesterday. Foley still in place.  Objective: Vital signs in last 24 hours: Temp:  [97.8 F (36.6 C)-98.7 F (37.1 C)] 98.4 F (36.9 C) (02/12 0951) Pulse Rate:  [67-84] 77 (02/12 0951) Resp:  [20-22] 22 (02/12 0951) BP: (134-162)/(69-84) 162/77 (02/12 0952) SpO2:  [88 %-96 %] 95 % (02/12 0951) Weight change:   Intake/Output from previous day: 02/11 0701 - 02/12 0700 In: 1259.3 [I.V.:1209.3; IV Piggyback:50] Out: 1330 [Urine:1300; Drains:30] Intake/Output this shift: Total I/O In: 480 [P.O.:480] Out: 350 [Urine:350]  Physical Exam: General: Obese female. Sitting up in chair. HEENT: Head: atraumatic, normocephalic. Eyes: conjunctivae clear. Respiratory: Respiratory effort normal. On 2L nasal cannula. Lungs clear to auscultation bilaterally, slightly diminished breath sounds at bases. Cardiac: Regular rate and rhythm, no murmurs, rubs, or gallops. Abdomen: Honeycomb dressing clean dry and intact, Blake drain in place with serosanguinous output. Soft, no significant tenderness to palpation. GU: Foley in place at time of exam. Urine appears foamy. Foley removed in PM. Extremities: Trace dependent edema. Mild tenderness to palpation over the right posterior calf. Negative Homan's sign. Skin: Warm, dry, no lesions. Neuro: Oriented x4. Psych: Mood and affect normal.   Lab Results: Recent Labs    02/11/20 0241 02/12/20 0807  WBC 11.2* 9.0  HGB 10.1* 9.8*  HCT 31.1* 30.7*  PLT 321 337   BMET:  Recent Labs    02/11/20 0241 02/12/20 0807  NA 145 142  K 3.5 3.0*  CL 106 108  CO2 23 22  GLUCOSE 101* 169*   BUN 87* 72*  CREATININE 5.85* 3.69*  CALCIUM 8.6* 8.9   No results for input(s): PTH in the last 72 hours. Iron Studies: No results for input(s): IRON, TIBC, TRANSFERRIN, FERRITIN in the last 72 hours.  Studies/Results: No results found.   Current Facility-Administered Medications (Endocrine & Metabolic):  .  insulin aspart (novoLOG) injection 0-5 Units .  insulin aspart (novoLOG) injection 0-9 Units   Current Facility-Administered Medications (Cardiovascular):  .  amLODipine (NORVASC) tablet 10 mg .  carvedilol (COREG) tablet 6.25 mg .  hydrALAZINE (APRESOLINE) injection 10 mg .  metoprolol tartrate (LOPRESSOR) injection 5 mg   Current Facility-Administered Medications (Respiratory):  .  albuterol (PROVENTIL) (2.5 MG/3ML) 0.083% nebulizer solution 2.5 mg   Current Facility-Administered Medications (Analgesics):  .  HYDROmorphone (DILAUDID) injection 0.5 mg   Current Facility-Administered Medications (Hematological):  .  heparin injection 5,000 Units   Current Facility-Administered Medications (Other):  .  0.9 %  sodium chloride infusion .  Chlorhexidine Gluconate Cloth 2 % PADS 6 each .  ondansetron (ZOFRAN-ODT) disintegrating tablet 4 mg **OR** ondansetron (ZOFRAN) injection 4 mg  .  pantoprazole (PROTONIX) injection 40 mg .  piperacillin-tazobactam (ZOSYN) IVPB 3.375 g  No current outpatient medications on file.   Assessment/Plan: Pt is a56 y.o.African Americanfemalewith diabetes mellitus type II,hypertension, asthma, GERDwas admitted on 2/9/2021with Incarcerated umbilical hernia 0000000 SOB (shortness of breath) [R06.02]s/p strangulated ventral hernia repair, small bowel resection, debridement and excision of skin and subcutaneous tissue on 02/09/20. Nephrology is consulted for acute kidney injury.  1. Acute kidney injury on CKD stage IIIB (baseline creatinine 1.89/GFR  33 as of June 2020). --Cr 3.69 today, down from 5.85 yesterday. --Records obtained  from [facility] showing Creatinine 1.89/GFR 33 in June 2020, Creatinine 1.40/GFR 48 in April 2019. --Urine output 1300 mL yesterday. --Urinalysis and protein/creatinine ratio pending. --Plan to order renal US once abdominal pain from surgery is well controlled. --Continue IV fluids. --Monitor I/O's. --K 3.0 today, will continue to monitor.   LOS: 3 days   Brianna [name] 02/12/2020,11:18 AM

## 2020-02-12 NOTE — Evaluation (Signed)
Physical Therapy Evaluation Patient Details Name: Brianna [name] MRN: [id number] DOB: 05/16/1961 Today's Date: 02/12/2020   History of Present Illness  Dimples Dukette is a 17yoF who comes to [facility] 02/09/20 c ABD pain. Since the weekend she has noticed worsening, and now severe, abdominal pain surrounding her umbilicus at the site of her umbilical hernia. Work up in the ED was concerning for likely pre-renal AKI with sCr in 6.44 and BUN of 74, a leukocytosis to 26K, and concerning for incarcerated umbilical hernia with possible small bowel strangulation. Went to surgery 2/9 for repair of hernia, small bowel resection. PMH: DM2, HTN, acid reflux, asthma.  Clinical Impression  Pt admitted with above diagnosis. Pt currently with functional limitations due to the deficits listed below (see "PT Problem List"). Upon entry, pt in bed, awake and agreeable to participate. The pt is alert and oriented x4, pleasant, conversational, and generally a good historian. Heavy effort and guarding with bed mobility and transfers, minA provided OOB, single hand held assist for going to chair. Pt on 2L/min O2 at entry, moved to room air trial with seated SpO2 89%. Pt denies prior O2 issues, nor any O2 use at home. Functional mobility assessment demonstrates increased effort/time requirements, poor tolerance, and need for physical assistance, whereas the patient performed these at a higher level of independence PTA. Pt will benefit from skilled PT intervention to increase independence and safety with basic mobility in preparation for discharge to the venue listed below.     Follow Up Recommendations Home health PT;Supervision - Intermittent    Equipment Recommendations  Rolling walker with 5" wheels    Recommendations for Other Services       Precautions / Restrictions Precautions Precautions: Fall Restrictions Weight Bearing Restrictions: No      Mobility  Bed Mobility Overal bed mobility: Needs  Assistance Bed Mobility: Supine to Sit     Supine to sit: [facility] elevated;Min assist     General bed mobility comments: 2 hand assist required, heavy effort required. Pt SOB at EOB 87-88% on room air.  Transfers Overall transfer level: Needs assistance Equipment used: None Transfers: Sit to/from Stand Sit to Stand: Supervision         General transfer comment: motivated to attempt without assist  Ambulation/Gait                Stairs            Wheelchair Mobility    Modified Rankin (Stroke Patients Only)       Balance Overall balance assessment: Needs assistance;Modified Independent Sitting-balance support: Feet unsupported Sitting balance-Leahy Scale: Good     Standing balance support: Single extremity supported;During functional activity Standing balance-Leahy Scale: Fair Standing balance comment: needs support 1 hand during pivot to chair                             Pertinent Vitals/Pain Pain Assessment: 0-10 Pain Score: 3  Pain Location: surgical site Pain Descriptors / Indicators: Aching Pain Intervention(s): Limited activity within patient's tolerance;Monitored during session;RN gave pain meds during session    [place]ander expects to be discharged to:: Private residence Living Arrangements: Other relatives(Lives with female cousine, works first shift M-F) Available Help at Discharge: Family(Uncle lives 10 minutes away) Type of Home: House Home Access: Stairs to enter   Technical brewer of Steps: 1 Home Layout: One level Home Equipment: None      Prior Function Level of Independence:  Independent         Comments: 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;     Journalist, newspaper        Extremity/Trunk Assessment   Upper Extremity Assessment Upper Extremity Assessment: Generalized weakness    Lower Extremity Assessment Lower Extremity Assessment: Generalized weakness       Communication       Cognition Arousal/Alertness: Awake/alert Behavior During Therapy: WFL for tasks assessed/performed Overall Cognitive Status: Within Functional Limits for tasks assessed                                        General Comments      Exercises     Assessment/Plan    PT Assessment Patient needs continued PT services  PT Problem List Decreased strength;Decreased activity tolerance;Decreased balance;Decreased mobility;Decreased knowledge of precautions       PT Treatment Interventions DME instruction;Balance training;Gait training;Functional mobility training;Stair training;Therapeutic activities;Therapeutic exercise;Patient/family education    PT Goals (Current goals can be found in the Care Plan section)  Acute Rehab PT Goals Patient Stated Goal: return to home, regain strength PT Goal Formulation: With patient Time For Goal Achievement: 02/26/20 Potential to Achieve Goals: Good    Frequency Min 2X/week   Barriers to discharge Decreased caregiver support alone during day    Co-evaluation               AM-PAC PT "6 Clicks" Mobility  Outcome Measure Help needed turning from your back to your side while in a flat bed without using bedrails?: A Lot Help needed moving from lying on your back to sitting on the side of a flat bed without using bedrails?: A Lot Help needed moving to and from a bed to a chair (including a wheelchair)?: A Lot Help needed standing up from a chair using your arms (e.g., wheelchair or bedside chair)?: A Little Help needed to walk in hospital room?: A Little Help needed climbing 3-5 steps with a railing? : A Little 6 Click Score: 15    End of Session Equipment Utilized During Treatment: Gait belt;Oxygen Activity Tolerance: Patient tolerated treatment well Patient left: in chair;with call bell/phone within reach Nurse Communication: Mobility status PT Visit Diagnosis: Unsteadiness on feet (R26.81);Other abnormalities of gait and  mobility (R26.89);Difficulty in walking, not elsewhere classified (R26.2);Muscle weakness (generalized) (M62.81)    Time: [id number] PT Time Calculation (min) (ACUTE ONLY): 15 min   Charges:   PT Evaluation $PT Eval Moderate Complexity: 1 Mod          10:25 AM, 02/12/20 Etta Grandchild, PT, DPT Physical Therapist - [facility]  [phone] ([place])    Kimbley Sprague C 02/12/2020, 10:22 AM

## 2020-02-12 NOTE — Plan of Care (Signed)
Continuing with plan of care. 

## 2020-02-12 NOTE — Consult Note (Signed)
Pharmacy Antibiotic Note  Brianna [name] is a 59 y.o. female admitted on 02/09/2020 with a strangulated ventral hernia now 3 Days Post-Op s/p exploratory laparotomy and repair.  Pharmacy was consulted for Zosyn dosing. Since admission her leukocytosis has improved (though not yet resolved), renal function has slightly improved (unclear baseline), with no recent febrile episodes. This is day #3 of antibiotics.  Plan: Continue Zosyn 3.375g IV q12h (4 hour infusion).  Height: 4\' 9"  (144.8 cm) Weight: 216 lb (98 kg) IBW/kg (Calculated) : 38.6  Temp (24hrs), Avg:98.3 F (36.8 C), Min:97.8 F (36.6 C), Max:98.7 F (37.1 C)  Recent Labs  Lab 02/09/20 0914 02/09/20 0931 02/10/20 0556 02/11/20 0241  WBC 26.1*  --  12.8* 11.2*  CREATININE 6.44*  --  6.92* 5.85*  LATICACIDVEN  --  1.7  --   --     Estimated Creatinine Clearance: 10.3 mL/min (A) (by C-G formula based on SCr of 5.85 mg/dL (H)).    Antimicrobials this admission: Zosyn 2/9 >>   Microbiology results: 2/9 SARS CoV-2: negative 2/9 influenza: negative  Thank you for allowing pharmacy to be a part of this patient's care.  Dallie Piles, PharmD, BCPS Clinical Pharmacist 02/12/2020 8:26 AM

## 2020-02-12 NOTE — Progress Notes (Signed)
Brianna [name] at Brianna [name] NAME: Brianna [name]    MR#:  [id number]  DATE OF BIRTH:  [birth year]  SUBJECTIVE:   Patient appears to be in good spirits. NG tube removed Flatus + She is getting out of bed to the chair. REVIEW OF SYSTEMS:   Review of Systems  Constitutional: Negative for chills, fever and weight loss.  HENT: Negative for ear discharge, ear pain and nosebleeds.   Eyes: Negative for blurred vision, pain and discharge.  Respiratory: Negative for sputum production, shortness of breath, wheezing and stridor.   Cardiovascular: Negative for chest pain, palpitations, orthopnea and PND.  Gastrointestinal: Positive for abdominal pain. Negative for diarrhea, nausea and vomiting.  Genitourinary: Negative for frequency and urgency.  Musculoskeletal: Negative for back pain and joint pain.  Neurological: Negative for sensory change, speech change, focal weakness and weakness.  Psychiatric/Behavioral: Negative for depression and hallucinations. The patient is not nervous/anxious.    Tolerating Diet: CLD Tolerating PT: ambulatory  DRUG ALLERGIES:   Allergies  Allergen Reactions  . Ace Inhibitors Anaphylaxis  . Lisinopril Anaphylaxis  . Lipitor [Atorvastatin] Other (See Comments)    Muscle aches    VITALS:  Blood pressure (!) 153/84, pulse 67, temperature 97.8 F (36.6 C), temperature source Oral, resp. rate 20, height 4\' 9"  (1.448 m), weight 98 kg, SpO2 95 %.  PHYSICAL EXAMINATION:   Physical Exam  GENERAL:  59 y.o.-year-old patient lying in the bed with no acute distress. obese EYES: Pupils equal, round, reactive to light and accommodation. No scleral icterus.   HEENT: Head atraumatic, normocephalic. Oropharynx and nasopharynx clear.  NECK:  Supple, no jugular venous distention. No thyroid enlargement, no tenderness.  LUNGS: Normal breath sounds bilaterally, no wheezing, rales, rhonchi. No use of accessory muscles of respiration.   CARDIOVASCULAR: S1, S2 normal. No murmurs, rubs, or gallops.  ABDOMEN: Soft, nontender,+distended. Few Bowel sounds present. No organomegaly or mass. Honeycomb dressing+, JP drain+ EXTREMITIES: No cyanosis, clubbing or edema b/l.    NEUROLOGIC: Cranial nerves II through XII are intact. No focal Motor or sensory deficits b/l.   PSYCHIATRIC:  patient is alert and oriented x 3.  SKIN: No obvious rash, lesion, or ulcer.   LABORATORY PANEL:  CBC Recent Labs  Lab 02/11/20 0241  WBC 11.2*  HGB 10.1*  HCT 31.1*  PLT 321    Chemistries  Recent Labs  Lab 02/10/20 0556 02/10/20 0556 02/11/20 0241  NA 140   < > 145  K 3.6   < > 3.5  CL 102   < > 106  CO2 23   < > 23  GLUCOSE 118*   < > 101*  BUN 79*   < > 87*  CREATININE 6.92*   < > 5.85*  CALCIUM 7.8*   < > 8.6*  MG 2.1   < > 2.3  AST 21  --   --   ALT 15  --   --   ALKPHOS 67  --   --   BILITOT 0.8  --   --    < > = values in this interval not displayed.   Cardiac Enzymes No results for input(s): TROPONINI in the last 168 hours. RADIOLOGY:  No results found. ASSESSMENT AND PLAN:  Brianna [name]  is a 59 y.o. female with a known history of type II diabetes without complication on insulin, hypertension, Jerrye Bushy the emergency room with severe abdominal pain around their umbilicus, constipation, vomiting with nausea for 2  to 3 days. Workup in the ER showed patient had possible incarcerated umbilical hernia.  1. Type II diabetes without complication, controlled -patient is on CLD -will keep on low-dose sliding scale-- advance insulin dosing according to patient's diet and PO intake -sugars are stable - A1c 7.2  2. Acute renal failure appears prerenal azotemia given G.I. losses and acute illness with incarcerated hernia -nephrology consultation with Dr Candiss Norse noted -continue IV fluids -baseline creatinine 1.14 (2014) -creatinine of 6.4-- 6.9--5.8--pending labs -UOP about 2400/24 hrs -avoid nephrotoxic agents  3.  Hypertension -resume coreg and amlodipine -PRN hydralazine and metoprolol  4. Incarcerated ventral hernia status post surgery day # 3 -continue antibiotics per surgical recommendation -NG removed  5.history of asthma -PRN oxygen -sats stable. Patient not in respiratory distress -wean to RA  6. DVT prophylaxis subcu heparin   Procedures: s/p ventral hernia surgery Family communication :patient CODE STATUS: FULL DVT Prophylaxis :Heparin   TOTAL TIME TAKING CARE OF THIS PATIENT: *25* minutes.  >50% time spent on counselling and coordination of care  Note: This dictation was prepared with Dragon dictation along with smaller phrase technology. Any transcriptional errors that result from this process are unintentional.  Brianna [name] M.D    [facility]   CC: Primary care physician; Brianna [name], MDPatient ID: Brianna [name], female   DOB: 06-09-1961, 59 y.o.   MRN: [id number]

## 2020-02-12 NOTE — Progress Notes (Addendum)
Physical Therapy Treatment Patient Details Name: Brianna [name] MRN: [id number] DOB: 1961/05/07 Today's Date: 02/12/2020    History of Present Illness Brianna [name] is a 62yoF who comes to [facility]/09/20 c ABD pain. Since the weekend she has noticed worsening, and now severe, abdominal pain surrounding her umbilicus at the site of her umbilical hernia. Work up in the ED was concerning for likely pre-renal AKI with sCr in 6.44 and BUN of 74, a leukocytosis to 26K, and concerning for incarcerated umbilical hernia with possible small bowel strangulation. Went to surgery 2/9 for repair of hernia, small bowel resection. PMH: DM2, HTN, acid reflux, asthma.    PT Comments    Author returning to 2nd session after nursing team finishing with their meds/assessment. Pt motivated to get up and AMB. Pt AMB on RA, 1L, and 2L, 1L advisable at this time, but SOB remains fairly high, incongruent with HR and SpO2. Pt rests every 61ft d/t SOB. On room air AMB, SpO2 89%. Pt reports feeling mildly deconditioned, some SOB typical for her particularly when after sedentary, but she is typically able to walk farther. Pt agreeable that RW at home would be a good idea from a safety standpoint. Encouraged to AMB with RN staff later in day.     Follow Up Recommendations  Home health PT;Supervision - Intermittent     Equipment Recommendations  Rolling walker with 5" wheels    Recommendations for Other Services       Precautions / Restrictions Precautions Precautions: Fall Restrictions Weight Bearing Restrictions: No    Mobility  Bed Mobility Overal bed mobility: Needs Assistance Bed Mobility: Supine to Sit     Supine to sit: HOB elevated;Min assist     General bed mobility comments: in chair upon entry  Transfers Overall transfer level: Needs assistance Equipment used: Rolling walker (2 wheeled) Transfers: Sit to/from Stand Sit to Stand: Supervision         General transfer comment: slow but  steady  Ambulation/Gait Ambulation/Gait assistance: Supervision Gait Distance (Feet): 160 Feet Assistive device: Rolling walker (2 wheeled) Gait Pattern/deviations: WFL(Within Functional Limits) Gait velocity: 0.77m/s Gait velocity interpretation: <1.31 ft/sec, indicative of household ambulator General Gait Details: slow, stops q 52ft to recovery d/t SOB: 99% on 2L, 89% on room air, 94% on 1L   Stairs             Wheelchair Mobility    Modified Rankin (Stroke Patients Only)       Balance Overall balance assessment: Modified Independent;No apparent balance deficits (not formally assessed) Sitting-balance support: Feet unsupported Sitting balance-Leahy Scale: Good     Standing balance support: Single extremity supported;During functional activity Standing balance-Leahy Scale: Fair Standing balance comment: needs support 1 hand during pivot to chair                            Cognition Arousal/Alertness: Awake/alert Behavior During Therapy: WFL for tasks assessed/performed Overall Cognitive Status: Within Functional Limits for tasks assessed                                        Exercises      General Comments        Pertinent Vitals/Pain Pain Assessment: 0-10 Pain Score: 3  Pain Location: surgical site Pain Descriptors / Indicators: Aching Pain Intervention(s): Limited activity within patient's tolerance;Monitored during session;RN gave  pain meds during session    [place] expects to be discharged to:: Private residence Living Arrangements: Other relatives(Lives with female cousine, works first shift M-F) Available Help at Discharge: Family(Uncle lives 10 minutes away) Type of Home: House Home Access: Stairs to enter   [place]: One level [place]: None      Prior Function Level of Independence: Independent      Comments: 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;   PT Goals (current goals can now  be found in the care plan section) Acute Rehab PT Goals Patient Stated Goal: return to home, regain strength PT Goal Formulation: With patient Time For Goal Achievement: 02/26/20 Potential to Achieve Goals: Good Progress towards PT goals: Progressing toward goals    Frequency    Min 2X/week      PT Plan Current plan remains appropriate    Co-evaluation              AM-PAC PT "6 Clicks" Mobility   Outcome Measure  Help needed turning from your back to your side while in a flat bed without using bedrails?: A Lot Help needed moving from lying on your back to sitting on the side of a flat bed without using bedrails?: A Lot Help needed moving to and from a bed to a chair (including a wheelchair)?: A Little Help needed standing up from a chair using your arms (e.g., wheelchair or bedside chair)?: A Little Help needed to walk in hospital room?: A Little Help needed climbing 3-5 steps with a railing? : A Little 6 Click Score: 16    End of Session Equipment Utilized During Treatment: Gait belt;Oxygen Activity Tolerance: Patient tolerated treatment well;Patient limited by fatigue Patient left: in chair;with call bell/phone within reach Nurse Communication: Mobility status PT Visit Diagnosis: Unsteadiness on feet (R26.81);Other abnormalities of gait and mobility (R26.89);Difficulty in walking, not elsewhere classified (R26.2);Muscle weakness (generalized) (M62.81)     Time: [id number] PT Time Calculation (min) (ACUTE ONLY): 18 min  Charges:  $Therapeutic Exercise: 8-22 mins                     10:41 AM, 02/12/20 Etta Grandchild, PT, DPT Physical Therapist - [facility]  [phone] (Green)   Raquelle Pietro C 02/12/2020, 10:35 AM

## 2020-02-13 LAB — CBC
HCT: 31.6 % — ABNORMAL LOW (ref 36.0–46.0)
Hemoglobin: 10 g/dL — ABNORMAL LOW (ref 12.0–15.0)
MCH: 29.7 pg (ref 26.0–34.0)
MCHC: 31.6 g/dL (ref 30.0–36.0)
MCV: 93.8 fL (ref 80.0–100.0)
Platelets: 350 10*3/uL (ref 150–400)
RBC: 3.37 MIL/uL — ABNORMAL LOW (ref 3.87–5.11)
RDW: 14.3 % (ref 11.5–15.5)
WBC: 8.4 10*3/uL (ref 4.0–10.5)
nRBC: 0 % (ref 0.0–0.2)

## 2020-02-13 LAB — MAGNESIUM: Magnesium: 2 mg/dL (ref 1.7–2.4)

## 2020-02-13 LAB — URINALYSIS, ROUTINE W REFLEX MICROSCOPIC
Bacteria, UA: NONE SEEN
Bilirubin Urine: NEGATIVE
Glucose, UA: NEGATIVE mg/dL
Ketones, ur: NEGATIVE mg/dL
Nitrite: NEGATIVE
Protein, ur: 100 mg/dL — AB
Specific Gravity, Urine: 1.014 (ref 1.005–1.030)
pH: 5 (ref 5.0–8.0)

## 2020-02-13 LAB — PROTEIN / CREATININE RATIO, URINE
Creatinine, Urine: 76 mg/dL
Protein Creatinine Ratio: 1.82 mg/mg{Cre} — ABNORMAL HIGH (ref 0.00–0.15)
Total Protein, Urine: 138 mg/dL

## 2020-02-13 LAB — BASIC METABOLIC PANEL
Anion gap: 10 (ref 5–15)
BUN: 55 mg/dL — ABNORMAL HIGH (ref 6–20)
CO2: 22 mmol/L (ref 22–32)
Calcium: 9.1 mg/dL (ref 8.9–10.3)
Chloride: 109 mmol/L (ref 98–111)
Creatinine, Ser: 2.86 mg/dL — ABNORMAL HIGH (ref 0.44–1.00)
GFR calc Af Amer: 20 mL/min — ABNORMAL LOW (ref >60)
GFR calc non Af Amer: 17 mL/min — ABNORMAL LOW (ref >60)
Glucose, Bld: 147 mg/dL — ABNORMAL HIGH (ref 70–99)
Potassium: 3.1 mmol/L — ABNORMAL LOW (ref 3.5–5.1)
Sodium: 141 mmol/L (ref 135–145)

## 2020-02-13 LAB — GLUCOSE, CAPILLARY
Glucose-Capillary: 146 mg/dL — ABNORMAL HIGH (ref 70–99)
Glucose-Capillary: 166 mg/dL — ABNORMAL HIGH (ref 70–99)
Glucose-Capillary: 158 mg/dL — ABNORMAL HIGH (ref 70–99)
Glucose-Capillary: 173 mg/dL — ABNORMAL HIGH (ref 70–99)

## 2020-02-13 LAB — PHOSPHORUS: Phosphorus: 3.2 mg/dL (ref 2.5–4.6)

## 2020-02-13 MED ORDER — PIPERACILLIN-TAZOBACTAM 3.375 G IVPB
3.3750 g | Freq: Three times a day (TID) | INTRAVENOUS | Status: DC
  Administered 2020-02-13 – 2020-02-14 (×3): 3.375 g via INTRAVENOUS
  Filled 2020-02-13 (×3): qty 50

## 2020-02-13 MED ORDER — PANTOPRAZOLE SODIUM 40 MG PO TBEC
40.0000 mg | DELAYED_RELEASE_TABLET | Freq: Every day | ORAL | Status: DC
  Administered 2020-02-13 – 2020-02-14 (×2): 40 mg via ORAL
  Filled 2020-02-13 (×2): qty 1

## 2020-02-13 MED ORDER — ASPIRIN EC 81 MG PO TBEC
81.0000 mg | DELAYED_RELEASE_TABLET | Freq: Every day | ORAL | Status: DC
  Administered 2020-02-13 – 2020-02-14 (×2): 81 mg via ORAL
  Filled 2020-02-13 (×2): qty 1

## 2020-02-13 MED ORDER — ALLOPURINOL 100 MG PO TABS
300.0000 mg | ORAL_TABLET | Freq: Every day | ORAL | Status: DC
  Administered 2020-02-13 – 2020-02-14 (×2): 300 mg via ORAL
  Filled 2020-02-13 (×2): qty 3

## 2020-02-13 MED ORDER — INSULIN GLARGINE 100 UNIT/ML ~~LOC~~ SOLN
10.0000 [IU] | Freq: Every day | SUBCUTANEOUS | Status: DC
  Administered 2020-02-13 – 2020-02-14 (×2): 10 [IU] via SUBCUTANEOUS
  Filled 2020-02-13 (×2): qty 0.1

## 2020-02-13 MED ORDER — POTASSIUM CHLORIDE CRYS ER 20 MEQ PO TBCR
40.0000 meq | EXTENDED_RELEASE_TABLET | Freq: Once | ORAL | Status: AC
  Administered 2020-02-13: 08:00:00 40 meq via ORAL
  Filled 2020-02-13: qty 2

## 2020-02-13 MED ORDER — HYDRALAZINE HCL 25 MG PO TABS
25.0000 mg | ORAL_TABLET | Freq: Three times a day (TID) | ORAL | Status: DC
  Administered 2020-02-13 – 2020-02-14 (×3): 25 mg via ORAL
  Filled 2020-02-13 (×3): qty 1

## 2020-02-13 MED ORDER — LINAGLIPTIN 5 MG PO TABS
5.0000 mg | ORAL_TABLET | Freq: Every day | ORAL | Status: DC
  Filled 2020-02-13: qty 1

## 2020-02-13 NOTE — Progress Notes (Signed)
02/13/2020  Subjective: Patient is 4 Days Post-Op status post strangulated ventral hernia repair and small bowel resection.  No acute events overnight.  Patient reports that she is doing well.  She had bowel movements x2 yesterday.  Her diet was advanced to a diabetic diet.  Her pain is well controlled.  She has been ambulating and work with physical therapy yesterday who recommended home health PT with a rolling walker.  Her creatinine continues to improve and it is 2.86 today.  Vital signs: Temp:  [98.2 F (36.8 C)-98.6 F (37 C)] 98.2 F (36.8 C) (02/13 1144) Pulse Rate:  [71-79] 72 (02/13 1144) Resp:  [18-24] 24 (02/13 1144) BP: (140-184)/(72-98) 159/90 (02/13 1144) SpO2:  [94 %-100 %] 100 % (02/13 1144)   Intake/Output: 02/12 0701 - 02/13 0700 In: 4853.8 [P.O.:1060; I.V.:3633.8; IV Piggyback:150] Out: 2790 [Urine:2750; Drains:40] Last BM Date: 02/12/20  Physical Exam: Constitutional: No acute distress Abdomen: Soft, nondistended, appropriately tender to palpation.  Midline incision is clean dry and intact with staples in place.  Subcutaneous Blake drain with serosanguineous fluid.  Labs:  Recent Labs    02/12/20 0807 02/13/20 0413  WBC 9.0 8.4  HGB 9.8* 10.0*  HCT 30.7* 31.6*  PLT 337 350   Recent Labs    02/12/20 0807 02/13/20 0413  NA 142 141  K 3.0* 3.1*  CL 108 109  CO2 22 22  GLUCOSE 169* 147*  BUN 72* 55*  CREATININE 3.69* 2.86*  CALCIUM 8.9 9.1   No results for input(s): LABPROT, INR in the last 72 hours.  Imaging: No results found.  Assessment/Plan: This is a 59 y.o. female s/p strangulated ventral hernia repair and small bowel resection.  -We will discontinue IV fluids today.  Continue with diabetic diet. -Continue IV antibiotics today. -Replete potassium today. -Would normally be able to discharge the patient home today, but given the weather conditions the patient has asked to stay in the hospital today.  I think this is reasonable and will  plan on discharging her tomorrow instead.  This will allow [place] to continue monitoring her renal function and her electrolytes.   Melvyn Neth, [facility]

## 2020-02-13 NOTE — Progress Notes (Signed)
Patient able to empty JP drain, after hand hygiene and recharges bulb appropriately.

## 2020-02-13 NOTE — Plan of Care (Signed)
Continuing with plan of care. 

## 2020-02-13 NOTE — Progress Notes (Addendum)
BP elevated. Patient recently voided. No pain. PRN Apresoline IV adm. Periods of snoring and possible sleep apnea noted when patient asleep.

## 2020-02-13 NOTE — Progress Notes (Addendum)
[facility] [place] NAME: Brianna [name]    MR#:  [id number]  DATE OF BIRTH:  1961/03/30  SUBJECTIVE:   Patient appears to be in good spirits. Overall feels better Asking me to get sleep study done REVIEW OF SYSTEMS:   Review of Systems  Constitutional: Negative for chills, fever and weight loss.  HENT: Negative for ear discharge, ear pain and nosebleeds.   Eyes: Negative for blurred vision, pain and discharge.  Respiratory: Negative for sputum production, shortness of breath, wheezing and stridor.   Cardiovascular: Negative for chest pain, palpitations, orthopnea and PND.  Gastrointestinal: Positive for abdominal pain. Negative for diarrhea, nausea and vomiting.  Genitourinary: Negative for frequency and urgency.  Musculoskeletal: Negative for back pain and joint pain.  Neurological: Negative for sensory change, speech change, focal weakness and weakness.  Psychiatric/Behavioral: Negative for depression and hallucinations. The patient is not nervous/anxious.    Tolerating Diet: reg carb control Tolerating PT: ambulatory  DRUG ALLERGIES:   Allergies  Allergen Reactions  . Ace Inhibitors Anaphylaxis  . Lisinopril Anaphylaxis  . Lipitor [Atorvastatin] Other (See Comments)    Muscle aches    VITALS:  Blood pressure (!) 159/90, pulse 72, temperature 98.2 F (36.8 C), temperature source Oral, resp. rate (!) 24, height 4\' 9"  (1.448 m), weight 98 kg, SpO2 100 %.  PHYSICAL EXAMINATION:   Physical Exam  GENERAL:  59 y.o.-year-old patient lying in the bed with no acute distress. obese EYES: Pupils equal, round, reactive to light and accommodation. No scleral icterus.   HEENT: Head atraumatic, normocephalic. Oropharynx and nasopharynx clear.  NECK:  Supple, no jugular venous distention. No thyroid enlargement, no tenderness.  LUNGS: Normal breath sounds bilaterally, no wheezing, rales, rhonchi. No use of accessory muscles of  respiration.  CARDIOVASCULAR: S1, S2 normal. No murmurs, rubs, or gallops.  ABDOMEN: Soft, nontender,+distended. Few Bowel sounds present. No organomegaly or mass. Honeycomb dressing+, blake drain+ EXTREMITIES: No cyanosis, clubbing or edema b/l.    NEUROLOGIC: Cranial nerves II through XII are intact. No focal Motor or sensory deficits b/l.   PSYCHIATRIC:  patient is alert and oriented x 3.  SKIN: No obvious rash, lesion, or ulcer.   LABORATORY PANEL:  CBC Recent Labs  Lab 02/13/20 0413  WBC 8.4  HGB 10.0*  HCT 31.6*  PLT 350    Chemistries  Recent Labs  Lab 02/10/20 0556 02/11/20 0241 02/13/20 0413  NA 140   < > 141  K 3.6   < > 3.1*  CL 102   < > 109  CO2 23   < > 22  GLUCOSE 118*   < > 147*  BUN 79*   < > 55*  CREATININE 6.92*   < > 2.86*  CALCIUM 7.8*   < > 9.1  MG 2.1   < > 2.0  AST 21  --   --   ALT 15  --   --   ALKPHOS 67  --   --   BILITOT 0.8  --   --    < > = values in this interval not displayed.   Cardiac Enzymes No results for input(s): TROPONINI in the last 168 hours. RADIOLOGY:  No results found. ASSESSMENT AND PLAN:  Brianna [name]  is a 59 y.o. female with a known history of type II diabetes without complication on insulin, hypertension, Jerrye Bushy the emergency room with severe abdominal pain around their umbilicus, constipation, vomiting with nausea for 2 to  3 days. Workup in the ER showed patient had possible incarcerated umbilical hernia.  1. Type II diabetes without complication, controlled -patient is on carb control diet -will keep on low-dose sliding scale-- start insulin lantus 10 units qd now that patient's diet and PO intake has improved -sugars are stable - Recent A1c 7.2  2. Acute renal failure appears prerenal azotemia given G.I. losses and acute illness with incarcerated hernia -nephrology consultation with Dr Candiss Norse noted -continue IV fluids -baseline creatinine 1.14 (2014) -creatinine of 6.4-- 6.9--5.8--3.6--2.8 -great  UOP -avoid nephrotoxic agents  3. Hypertension -resume coreg and amlodipine--add hydralazine -PRN hydralazine and metoprolol  4. Incarcerated ventral hernia status post surgery day # 4 -continue antibiotics per surgical recommendation -now on regular diet  5.history of asthma -PRN oxygen -sats stable. Patient not in respiratory distress -wean to RA  6. DVT prophylaxis subcu heparin   Procedures: s/p ventral hernia surgery Family communication :patient CODE STATUS: FULL DVT Prophylaxis :Heparin   TOTAL TIME TAKING CARE OF THIS PATIENT: *25* minutes.  >50% time spent on counselling and coordination of care  Note: This dictation was prepared with Dragon dictation along with smaller phrase technology. Any transcriptional errors that result from this process are unintentional.  [name] M.D    [facility]   CC: Primary care physician; Donnie Coffin, MDPatient ID: Brianna [name], female   DOB: 1961/07/18, 59 y.o.   MRN: [id number]

## 2020-02-13 NOTE — Progress Notes (Signed)
[facility]  ROUNDING NOTE   Subjective:  Renal function appears to be improving. Creatinine down to 2.8. Patient in very good spirits today. Urine output was 2.7 L over the preceding 24 hours.  Objective:  Vital signs in last 24 hours:  Temp:  [98.2 F (36.8 C)-98.4 F (36.9 C)] 98.2 F (36.8 C) (02/13 1144) Pulse Rate:  [71-79] 72 (02/13 1144) Resp:  [18-24] 24 (02/13 1144) BP: (140-184)/(72-98) 159/90 (02/13 1144) SpO2:  [97 %-100 %] 100 % (02/13 1144)  Weight change:  Filed Weights   02/09/20 0911 02/09/20 1128  Weight: 98 kg 98 kg    Intake/Output: I/O last 3 completed shifts: In: 4853.8 [P.O.:1060; I.V.:3633.8; Other:10; IV Piggyback:150] Out: 6063 [Urine:3550; Drains:70]   Intake/Output this shift:  Total I/O In: 240 [P.O.:240] Out: 690 [Urine:675; Drains:15]  Physical Exam: General: No acute distress  Head: Normocephalic, atraumatic. Moist oral mucosal membranes  Eyes: Anicteric  Neck: Supple, trachea midline  Lungs:  Clear to auscultation, normal effort  Heart: S1S2 no rubs  Abdomen:  Soft, nontender, abdominal incision noted.  Drain in place.  Extremities: No peripheral edema.  Neurologic: Awake, alert, following commands  Skin: No lesions       Basic Metabolic Panel: Recent Labs  Lab 02/09/20 0914 02/09/20 0914 02/10/20 0556 02/10/20 0556 02/11/20 0241 02/12/20 0807 02/13/20 0413  NA 137  --  140  --  145 142 141  K 3.4*  --  3.6  --  3.5 3.0* 3.1*  CL 90*  --  102  --  106 108 109  CO2 24  --  23  --  _0 GLUCOSE 274*  --  118*  --  101* 169* 147*  BUN 74*  --  79*  --  87* 72* 55*  CREATININE 6.44*  --  6.92*  --  5.85* 3.69* 2.86*  CALCIUM 9.9   < > 7.8*   < > 8.6* 8.9 9.1  MG  --   --  2.1  --  2.3  --  2.0  PHOS  --   --  9.3*  --  8.7*  --  3.2   < > = values in this interval not displayed.    Liver Function Tests: Recent Labs  Lab 02/09/20 0914 02/10/20 0556  AST 19 21  ALT 17 15  ALKPHOS 111 67   BILITOT 0.9 0.8  PROT 9.1* 6.1*  ALBUMIN 3.3* 2.2*   Recent Labs  Lab 02/09/20 0931  LIPASE 19   No results for input(s): AMMONIA in the last 168 hours.  CBC: Recent Labs  Lab 02/09/20 0914 02/10/20 0556 02/11/20 0241 02/12/20 0807 02/13/20 0413  WBC 26.1* 12.8* 11.2* 9.0 8.4  NEUTROABS 21.0*  --   --   --   --   HGB 13.9 10.9* 10.1* 9.8* 10.0*  HCT 41.9 33.5* 31.1* 30.7* 31.6*  MCV 89.3 90.8 91.7 93.6 93.8  PLT 419* 304 321 337 350    Cardiac Enzymes: No results for input(s): CKTOTAL, CKMB, CKMBINDEX, TROPONINI in the last 168 hours.  BNP: Invalid input(s): POCBNP  CBG: Recent Labs  Lab 02/12/20 1205 02/12/20 1647 02/12/20 2101 02/13/20 0751 02/13/20 1139  GLUCAP 143* 150* 154* 146* 166*    Microbiology: Results for orders placed or performed during the hospital encounter of 02/09/20  Respiratory Panel by RT PCR (Flu A&B, Covid) - Nasopharyngeal Swab     Status: None   Collection Time: 02/09/20  9:55 AM  Specimen: Nasopharyngeal Swab  Result Value Ref Range Status   SARS Coronavirus 2 by RT PCR NEGATIVE NEGATIVE Final    Comment: (NOTE) SARS-CoV-2 target nucleic acids are NOT DETECTED. The SARS-CoV-2 RNA is generally detectable in upper respiratoy specimens during the acute phase of infection. The lowest concentration of SARS-CoV-2 viral copies this assay can detect is 131 copies/mL. A negative result does not preclude SARS-Cov-2 infection and should not be used as the sole basis for treatment or other patient management decisions. A negative result may occur with  improper specimen collection/handling, submission of specimen other than nasopharyngeal swab, presence of viral mutation(s) within the areas targeted by this assay, and inadequate number of viral copies (<131 copies/mL). A negative result must be combined with clinical observations, patient history, and epidemiological information. The expected result is Negative. Fact Sheet for  Patients:  PinkCheek.be Fact Sheet for Healthcare Providers:  GravelBags.it This test is not yet ap proved or cleared by the [place] FDA and  has been authorized for detection and/or diagnosis of SARS-CoV-2 by FDA under an Emergency Use Authorization (EUA). This EUA will remain  in effect (meaning this test can be used) for the duration of the COVID-19 declaration under Section 564(b)(1) of the Act, 21 U.S.C. section 360bbb-3(b)(1), unless the authorization is terminated or revoked sooner.    Influenza A by PCR NEGATIVE NEGATIVE Final   Influenza B by PCR NEGATIVE NEGATIVE Final    Comment: (NOTE) The Xpert Xpress SARS-CoV-2/FLU/RSV assay is intended as an aid in  the diagnosis of influenza from Nasopharyngeal swab specimens and  should not be used as a sole basis for treatment. Nasal washings and  aspirates are unacceptable for Xpert Xpress SARS-CoV-2/FLU/RSV  testing. Fact Sheet for Patients: PinkCheek.be Fact Sheet for Healthcare Providers: GravelBags.it This test is not yet approved or cleared by the [place] FDA and  has been authorized for detection and/or diagnosis of SARS-CoV-2 by  FDA under an Emergency Use Authorization (EUA). This EUA will remain  in effect (meaning this test can be used) for the duration of the  Covid-19 declaration under Section 564(b)(1) of the Act, 21  U.S.C. section 360bbb-3(b)(1), unless the authorization is  terminated or revoked. Performed at [facility], [address] [place], [place] [address]     Coagulation Studies: No results for input(s): LABPROT, INR in the last 72 hours.  Urinalysis: Recent Labs    02/13/20 0020  COLORURINE YELLOW*  LABSPEC 1.014  PHURINE 5.0  GLUCOSEU NEGATIVE  HGBUR MODERATE*  BILIRUBINUR NEGATIVE  KETONESUR NEGATIVE  PROTEINUR 100*  NITRITE NEGATIVE  LEUKOCYTESUR  TRACE*      Imaging: No results found.   Medications:   . piperacillin-tazobactam (ZOSYN)  IV 3.375 g (02/13/20 1435)   . allopurinol  300 mg Oral Daily  . amLODipine  10 mg Oral Daily  . aspirin EC  81 mg Oral Daily  . carvedilol  6.25 mg Oral BID  . Chlorhexidine Gluconate Cloth  6 each Topical Daily  . heparin injection (subcutaneous)  5,000 Units Subcutaneous Q8H  . hydrALAZINE  25 mg Oral Q8H  . insulin aspart  0-5 Units Subcutaneous QHS  . insulin aspart  0-9 Units Subcutaneous TID WC  . insulin glargine  10 Units Subcutaneous Daily  . pantoprazole  40 mg Oral Daily   albuterol, hydrALAZINE, HYDROmorphone (DILAUDID) injection, ondansetron **OR** ondansetron (ZOFRAN) IV  Assessment/ Plan:  59 y.o. female with diabetes mellitus type II,hypertension, asthma, GERDwas admitted on 2/9/2021with Incarcerated umbilical  hernia [K42.0] SOB (shortness of breath) [R06.02]s/p strangulated ventral hernia repair, small bowel resection, debridement and excision of skin and subcutaneous tissue on 02/09/20. Nephrology is consulted for acute kidney injury.  1.  Acute kidney injury on chronic kidney disease stage IIIb.  Baseline creatinine 1.89, EGFR 33.  Renal function continues to improve.  Creatinine down to 2.8.  Urine output was good at 2.7 L over the preceding 24 hours.  Patient eating and drinking at liberty now.  Continue to monitor renal parameters for now.  She will need outpatient follow-up for this issue.  2.  Anemia of chronic kidney disease.  Hemoglobin currently 10.0.  Follow-up as an outpatient.  No urgent indication for Epogen at the moment.  3.  Hypertension.  Maintain the patient on amlodipine, carvedilol, and hydralazine.  Avoid ACE inhibitor or ARB during renal recovery.   LOS: 4 Aadvik Roker 2/13/20214:02 PM

## 2020-02-13 NOTE — Progress Notes (Signed)
Physical Therapy Treatment Patient Details Name: Brianna [name] MRN: [id number] DOB: 1961/04/29 Today's Date: 02/13/2020    History of Present Illness Brianna [name] is a 45yoF who comes to [facility]/09/20 c ABD pain. Since the weekend she has noticed worsening, and now severe, abdominal pain surrounding her umbilicus at the site of her umbilical hernia. Work up in the ED was concerning for likely pre-renal AKI with sCr in 6.44 and BUN of 74, a leukocytosis to 26K, and concerning for incarcerated umbilical hernia with possible small bowel strangulation. Went to surgery 2/9 for repair of hernia, small bowel resection. PMH: DM2, HTN, acid reflux, asthma.    PT Comments    Pt was long sitting in room upon arriving. She was on room air throughout session with O2 > 90%. She agrees to PT session and is cooperative throughout. RN noticed IV had been removed and was unable to start medication. She contacted support staff to replace IV but cleared pt to ambulate in meantime. Pt required no assistance exiting bed or standing. She was able to ambulate 1 lap in hallway with supervision + 3 standing rest breaks 2/2 SOB. With breathing techniques, SOB resolves. Vitals stable throughout with HR elevation to 117 bpm and O2 maintained > 90 % on room air. Pt is progressing well with PT and will continue to be followed per POC.   Follow Up Recommendations  Home health PT;Supervision - Intermittent     Equipment Recommendations  Other (comment);Hospital bed(Pt has received personal RW in room)    Recommendations for Other Services       Precautions / Restrictions Precautions Precautions: Fall Restrictions Weight Bearing Restrictions: No    Mobility  Bed Mobility Overal bed mobility: Modified Independent Bed Mobility: Supine to Sit     Supine to sit: HOB elevated;Modified independent (Device/Increase time)     General bed mobility comments: Pt was able to exit R side of bed with increased time. NO  assistance or cueing provided.  Transfers Overall transfer level: Modified independent Equipment used: Rolling walker (2 wheeled) Transfers: Sit to/from Stand Sit to Stand: Supervision         General transfer comment: Supervision for safety. Gait belt applied for safety.  Ambulation/Gait Ambulation/Gait assistance: Supervision Gait Distance (Feet): 160 Feet Assistive device: Rolling walker (2 wheeled) Gait Pattern/deviations: WFL(Within Functional Limits)     General Gait Details: Pt was able to ambulate one lap around unit (~160 ft) with RW + supervision. Pt was on room air throughout and O2 levels >91% throughout. HR WFL for ambulation. she did require 3 standing rest during gait training 2/2 SOB that resolves with breathing techniques.   Stairs             Wheelchair Mobility    Modified Rankin (Stroke Patients Only)       Balance Overall balance assessment: Modified Independent;No apparent balance deficits (not formally assessed) Sitting-balance support: Feet unsupported;No upper extremity supported Sitting balance-Leahy Scale: Normal     Standing balance support: Bilateral upper extremity supported Standing balance-Leahy Scale: Good                              Cognition Arousal/Alertness: Awake/alert Behavior During Therapy: WFL for tasks assessed/performed Overall Cognitive Status: Within Functional Limits for tasks assessed  General Comments: Pt was A and O x 4      Exercises      General Comments        Pertinent Vitals/Pain Pain Assessment: No/denies pain Pain Score: 0-No pain Pain Intervention(s): Monitored during session    Home Living                      Prior Function            PT Goals (current goals can now be found in the care plan section) Acute Rehab PT Goals Patient Stated Goal: " I'm feeling better and want to go home tomorrow." Progress towards PT  goals: Progressing toward goals    Frequency    Min 2X/week      PT Plan Current plan remains appropriate    Co-evaluation              AM-PAC PT "6 Clicks" Mobility   Outcome Measure  Help needed turning from your back to your side while in a flat bed without using bedrails?: None Help needed moving from lying on your back to sitting on the side of a flat bed without using bedrails?: A Little Help needed moving to and from a bed to a chair (including a wheelchair)?: A Little Help needed standing up from a chair using your arms (e.g., wheelchair or bedside chair)?: A Little Help needed to walk in hospital room?: A Little Help needed climbing 3-5 steps with a railing? : A Little 6 Click Score: 19    End of Session Equipment Utilized During Treatment: Gait belt Activity Tolerance: Patient tolerated treatment well Patient left: in chair;with chair alarm set;with nursing/sitter in room;with call bell/phone within reach Nurse Communication: Mobility status PT Visit Diagnosis: Unsteadiness on feet (R26.81);Other abnormalities of gait and mobility (R26.89);Difficulty in walking, not elsewhere classified (R26.2);Muscle weakness (generalized) (M62.81)     Time: [id number] PT Time Calculation (min) (ACUTE ONLY): 24 min  Charges:  $Gait Training: 8-22 mins $Therapeutic Activity: 8-22 mins                     Julaine Fusi PTA 02/13/20, 2:22 PM

## 2020-02-13 NOTE — Consult Note (Signed)
Pharmacy Antibiotic Note  Brianna [name] is a 59 y.o. female admitted on 02/09/2020 with a strangulated ventral hernia now 3 Days Post-Op s/p exploratory laparotomy and repair.  Pharmacy was consulted for Zosyn dosing. Since admission her leukocytosis has improved (though not yet resolved), renal function has slightly improved (unclear baseline), with no recent febrile episodes. This is day #5 of antibiotics.  Plan: Scr has improved. CrCl now >40ml/min  Will adjust Zosyn to 3.375g IV q8h (4 hour infusion).  Height: 4\' 9"  (144.8 cm) Weight: 216 lb (98 kg) IBW/kg (Calculated) : 38.6  Temp (24hrs), Avg:98.4 F (36.9 C), Min:98.2 F (36.8 C), Max:98.6 F (37 C)  Recent Labs  Lab 02/09/20 0914 02/09/20 0931 02/10/20 0556 02/11/20 0241 02/12/20 0807 02/13/20 0413  WBC 26.1*  --  12.8* 11.2* 9.0 8.4  CREATININE 6.44*  --  6.92* 5.85* 3.69* 2.86*  LATICACIDVEN  --  1.7  --   --   --   --     Estimated Creatinine Clearance: 21.1 mL/min (A) (by C-G formula based on SCr of 2.86 mg/dL (H)).    Antimicrobials this admission: Zosyn 2/9 >>   Microbiology results: 2/9 SARS CoV-2: negative 2/9 influenza: negative  Thank you for allowing pharmacy to be a part of this patient's care.  Pernell Dupre, PharmD, BCPS Clinical Pharmacist 02/13/2020 7:48 AM

## 2020-02-14 LAB — BASIC METABOLIC PANEL
Anion gap: 10 (ref 5–15)
BUN: 43 mg/dL — ABNORMAL HIGH (ref 6–20)
CO2: 21 mmol/L — ABNORMAL LOW (ref 22–32)
Calcium: 8.9 mg/dL (ref 8.9–10.3)
Chloride: 110 mmol/L (ref 98–111)
Creatinine, Ser: 2.29 mg/dL — ABNORMAL HIGH (ref 0.44–1.00)
GFR calc Af Amer: 26 mL/min — ABNORMAL LOW (ref >60)
GFR calc non Af Amer: 23 mL/min — ABNORMAL LOW (ref >60)
Glucose, Bld: 175 mg/dL — ABNORMAL HIGH (ref 70–99)
Potassium: 3.6 mmol/L (ref 3.5–5.1)
Sodium: 141 mmol/L (ref 135–145)

## 2020-02-14 LAB — GLUCOSE, CAPILLARY
Glucose-Capillary: 144 mg/dL — ABNORMAL HIGH (ref 70–99)
Glucose-Capillary: 166 mg/dL — ABNORMAL HIGH (ref 70–99)

## 2020-02-14 MED ORDER — ACETAMINOPHEN 500 MG PO TABS: 1000.0000 mg | ORAL_TABLET | Freq: Four times a day (QID) | ORAL | 2 refills | Status: AC | PRN

## 2020-02-14 MED ORDER — FUROSEMIDE 40 MG PO TABS: 40.0000 mg | ORAL_TABLET | Freq: Every day | ORAL | 0 refills | Status: DC | PRN

## 2020-02-14 MED ORDER — HUMULIN N 100 UNIT/ML ~~LOC~~ SUSP: 20.0000 [IU] | Freq: Two times a day (BID) | SUBCUTANEOUS | 11 refills | Status: DC

## 2020-02-14 MED ORDER — OXYCODONE HCL 5 MG PO TABS: 5.0000 mg | ORAL_TABLET | ORAL | 0 refills | Status: DC | PRN

## 2020-02-14 MED ORDER — PNEUMOCOCCAL VAC POLYVALENT 25 MCG/0.5ML IJ INJ: 0.5000 mL | INJECTION | INTRAMUSCULAR | Status: DC

## 2020-02-14 MED ORDER — CARVEDILOL 12.5 MG PO TABS: 12.5000 mg | ORAL_TABLET | Freq: Two times a day (BID) | ORAL | 1 refills | Status: DC

## 2020-02-14 MED ORDER — PNEUMOCOCCAL VAC POLYVALENT 25 MCG/0.5ML IJ INJ
0.5000 mL | INJECTION | INTRAMUSCULAR | Status: AC
  Administered 2020-02-14: 12:00:00 0.5 mL via INTRAMUSCULAR

## 2020-02-14 NOTE — Discharge Summary (Signed)
Patient ID: Brianna [name] MRN: [id number] DOB/AGE: 05/16/1961 59 y.o.  Admit date: 02/09/2020 Discharge date: 02/14/2020   Discharge Diagnoses:  Principal Problem:   Strangulated ventral hernia Active Problems:   Type 2 diabetes mellitus without complication, with long-term current use of insulin (HCC)   Acute renal failure with tubular necrosis (HCC)   Hypertension   Procedures:  Strangulated ventral hernia repair, small bowel resection.  Hospital Course:   Patient presented on 2/9 with a strangulated ventral hernia and was taken to the OR same day for exploratory laparotomy, repair of her hernia, and small bowel resection of necrotic bowel.  She was found to have significant AKI which has been slowly improving.  Both hospitalist and nephrology teams were involved in her care, appreciate their assistance.  Her NG tube was removed after return of bowel function and her diet was slowly advanced.  Her pain was well controlled.  She ambulated and worked with PT who recommended home health PT and rolling walker.  She's deemed ready for discharge.  On exam, abdomen is soft, non-distended, appropriately sore to palpation.  Incision is clean, dry, intact with staples.  Drain with serosanguinous fluid.    Consults: Nephrology, Hospitalist, PT.  Disposition: Discharge disposition: 01-Home or Self Care       Discharge Instructions    Call MD for:  difficulty breathing, headache or visual disturbances   Complete by: As directed    Call MD for:  persistant nausea and vomiting   Complete by: As directed    Call MD for:  redness, tenderness, or signs of infection (pain, swelling, redness, odor or green/yellow discharge around incision site)   Complete by: As directed    Call MD for:  severe uncontrolled pain   Complete by: As directed    Call MD for:  temperature >100.4   Complete by: As directed    Change dressing (specify)   Complete by: As directed    Midline incision does not  require any dressings.  The drain dressing can be changed once daily with dry gauze and tape.   Diet - low sodium heart healthy   Complete by: As directed    Discharge instructions   Complete by: As directed    1.  Patient may shower, but do not scrub wounds heavily and dab dry only. 2.  Do not submerge wounds in pool/tub for 1 week. 3.  Do not remove staples. 4.  Empty and record drain output daily or twice daily.  Bring record with you to office visit.   Driving Restrictions   Complete by: As directed    Do not drive while taking narcotics for pain control.   Increase activity slowly   Complete by: As directed    Lifting restrictions   Complete by: As directed    No heavy lifting or pushing of more than 10-15 lbs for 6 weeks.     Allergies as of 02/14/2020      Reactions   Ace Inhibitors Anaphylaxis   Lisinopril Anaphylaxis   Lipitor [atorvastatin] Other (See Comments)   Muscle aches      Medication List    STOP taking these medications   hydrochlorothiazide 25 MG tablet Commonly known as: HYDRODIURIL     TAKE these medications   acetaminophen 500 MG tablet Commonly known as: TYLENOL Take 2 tablets (1,000 mg total) by mouth every 6 (six) hours as needed for mild pain.   albuterol (2.5 MG/3ML) 0.083% nebulizer solution Commonly known as: PROVENTIL  Take 2.5 mg by nebulization every 4 (four) hours as needed for shortness of breath or wheezing.   Proventil HFA 108 (90 Base) MCG/ACT inhaler Generic drug: albuterol Inhale 2 puffs into the lungs 4 (four) times daily as needed for shortness of breath or wheezing.   allopurinol 300 MG tablet Commonly known as: ZYLOPRIM Take 300 mg by mouth daily.   amLODipine 10 MG tablet Commonly known as: NORVASC Take 10 mg by mouth daily.   Aspirin Adult Low Strength 81 MG EC tablet Generic drug: aspirin Take 81 mg by mouth daily.   carvedilol 12.5 MG tablet Commonly known as: COREG Take 1 tablet (12.5 mg total) by mouth 2  (two) times daily. What changed:   medication strength  how much to take   cetirizine 10 MG tablet Commonly known as: ZYRTEC Take 10 mg by mouth daily.   furosemide 40 MG tablet Commonly known as: LASIX Take 1 tablet (40 mg total) by mouth daily as needed. For leg swelling or weight gain >5lbs What changed:   when to take this  reasons to take this  additional instructions   HumuLIN N 100 UNIT/ML injection Generic drug: insulin NPH Human Inject 0.2 mLs (20 Units total) into the skin 2 (two) times daily. What changed: how much to take   Januvia 100 MG tablet Generic drug: sitaGLIPtin Take 100 mg by mouth daily.   omeprazole 20 MG capsule Commonly known as: PRILOSEC Take 20 mg by mouth daily.   oxyCODONE 5 MG immediate release tablet Commonly known as: Oxy IR/ROXICODONE Take 1 tablet (5 mg total) by mouth every 4 (four) hours as needed for moderate pain or severe pain.            Durable Medical Equipment  (From admission, onward)         Start     Ordered   02/12/20 1559  For home use only DME Walker rolling  Once    Question Answer Comment  Walker: With [place] Wheels   Patient needs a walker to treat with the following condition Physical deconditioning      02/12/20 1559           Discharge Care Instructions  (From admission, onward)         Start     Ordered   02/14/20 0000  Change dressing (specify)    Comments: Midline incision does not require any dressings.  The drain dressing can be changed once daily with dry gauze and tape.   02/14/20 1100         Follow-up Information    Olean Ree, MD Follow up on 02/17/2020.   Specialty: General Surgery Contact information: [address] [place] [address] [phone]

## 2020-02-14 NOTE — Progress Notes (Signed)
[facility] NAME: Pricie Mclerran    MR#:  [id number]  DATE OF BIRTH:  June 06, 1961  SUBJECTIVE:   Patient appears to be in good spirits. Overall feels better Ambulating in the room REVIEW OF SYSTEMS:   Review of Systems  Constitutional: Negative for chills, fever and weight loss.  HENT: Negative for ear discharge, ear pain and nosebleeds.   Eyes: Negative for blurred vision, pain and discharge.  Respiratory: Negative for sputum production, shortness of breath, wheezing and stridor.   Cardiovascular: Negative for chest pain, palpitations, orthopnea and PND.  Gastrointestinal: Positive for abdominal pain. Negative for diarrhea, nausea and vomiting.  Genitourinary: Negative for frequency and urgency.  Musculoskeletal: Negative for back pain and joint pain.  Neurological: Negative for sensory change, speech change, focal weakness and weakness.  Psychiatric/Behavioral: Negative for depression and hallucinations. The patient is not nervous/anxious.    Tolerating Diet: reg carb control Tolerating PT: ambulatory  DRUG ALLERGIES:   Allergies  Allergen Reactions  . Ace Inhibitors Anaphylaxis  . Lisinopril Anaphylaxis  . Lipitor [Atorvastatin] Other (See Comments)    Muscle aches    VITALS:  Blood pressure (!) 169/81, pulse 64, temperature (!) 97.5 F (36.4 C), temperature source Oral, resp. rate (!) 21, height 4\' 9"  (1.448 m), weight 98 kg, SpO2 99 %.  PHYSICAL EXAMINATION:   Physical Exam  GENERAL:  59 y.o.-year-old patient lying in the bed with no acute distress. obese EYES: Pupils equal, round, reactive to light and accommodation. No scleral icterus.   HEENT: Head atraumatic, normocephalic. Oropharynx and nasopharynx clear.  NECK:  Supple, no jugular venous distention. No thyroid enlargement, no tenderness.  LUNGS: Normal breath sounds bilaterally, no wheezing, rales, rhonchi. No use of accessory muscles of respiration.   CARDIOVASCULAR: S1, S2 normal. No murmurs, rubs, or gallops.  ABDOMEN: Soft, nontender,+distended. Few Bowel sounds present. No organomegaly or mass. Honeycomb dressing+, blake drain+ EXTREMITIES: No cyanosis, clubbing or edema b/l.    NEUROLOGIC: Cranial nerves II through XII are intact. No focal Motor or sensory deficits b/l.   PSYCHIATRIC:  patient is alert and oriented x 3.  SKIN: No obvious rash, lesion, or ulcer.   LABORATORY PANEL:  CBC Recent Labs  Lab 02/13/20 0413  WBC 8.4  HGB 10.0*  HCT 31.6*  PLT 350    Chemistries  Recent Labs  Lab 02/10/20 0556 02/11/20 0241 02/13/20 0413 02/13/20 0413 02/14/20 0435  NA 140   < > 141   < > 141  K 3.6   < > 3.1*   < > 3.6  CL 102   < > 109   < > 110  CO2 23   < > 22   < > 21*  GLUCOSE 118*   < > 147*   < > 175*  BUN 79*   < > 55*   < > 43*  CREATININE 6.92*   < > 2.86*   < > 2.29*  CALCIUM 7.8*   < > 9.1   < > 8.9  MG 2.1   < > 2.0  --   --   AST 21  --   --   --   --   ALT 15  --   --   --   --   ALKPHOS 67  --   --   --   --   BILITOT 0.8  --   --   --   --    < > =  values in this interval not displayed.   Cardiac Enzymes No results for input(s): TROPONINI in the last 168 hours. RADIOLOGY:  No results found. ASSESSMENT AND PLAN:  Jorjia Goodly  is a 59 y.o. female with a known history of type II diabetes without complication on insulin, hypertension, Gerd the emergency room with severe abdominal pain around their umbilicus, constipation, vomiting with nausea for 2 to 3 days. Workup in the ER showed patient had possible incarcerated umbilical hernia.  1. Type II diabetes without complication, controlled -patient is on carb control diet -will keep on low-dose sliding scale--sugars are stable - Recent A1c 7.2 -Humulin N 20 units bid--and increase to 25 units bid in next few days -cont Januvia -keep log of sugars at home  2. Acute renal failure appears prerenal azotemia given G.I. losses and acute illness with  incarcerated hernia -nephrology consultation with Dr Candiss Norse noted -continue IV fluids -baseline creatinine 1.14 (2014) -creatinine of 6.4-- 6.9--5.8--3.6--2.8-2.2 -great UOP -avoid nephrotoxic agents -lasix prn and d/ced hctz for now  3. Hypertension -resume coreg (increased dose) and amlodipine- -PRN hydralazine  -holding HCTZ at discharge due to creat- defer to PCP to resume as out pt  4. Incarcerated ventral hernia status post surgery day # 5 -continue antibiotics per surgical recommendation -now on regular diet  5.history of asthma -PRN oxygen -sats stable. Patient not in respiratory distress -wean to RA  6. DVT prophylaxis subcu heparin  7. Probable OSA Pt recommended to get sleep study as out pt   Procedures: s/p ventral hernia surgery Family communication :patient CODE STATUS: FULL DVT Prophylaxis :Heparin  IM will sign off. Out pt meds reconciled. Dr Hampton Abbot aware  TOTAL TIME TAKING CARE OF THIS PATIENT: *25* minutes.  >50% time spent on counselling and coordination of care  Note: This dictation was prepared with Dragon dictation along with smaller phrase technology. Any transcriptional errors that result from this process are unintentional.  [name] M.D    [facility]   CC: Primary care physician; Donnie Coffin, MDPatient ID: [name], female   DOB: November 03, 1961, 59 y.o.   MRN: [id number]

## 2020-02-14 NOTE — Progress Notes (Signed)
Discharge teaching completed with patient who verbalized understanding of teaching, follow up appointments and medications.  Discharged in stable condition and with all belongings.

## 2020-02-14 NOTE — Plan of Care (Signed)
Continuing plan of care

## 2020-02-14 NOTE — Progress Notes (Signed)
Patient teaching completed with JP drain, patient had return demonstration and verbalized understanding of all care.

## 2020-02-14 NOTE — Progress Notes (Addendum)
[facility]  ROUNDING NOTE   Subjective:  Late entry.  Patient seen prior to discharge. Patient continues to improve. Creatinine now down to 2.29. BUN 43. Tolerating diet well. Denies vomiting.  Objective:  Vital signs in last 24 hours:  Temp:  [97.5 F (36.4 C)-98.6 F (37 C)] 98.6 F (37 C) (02/14 1159) Pulse Rate:  [64-78] 74 (02/14 1159) Resp:  [18-21] 18 (02/14 1159) BP: (131-169)/(72-89) 166/89 (02/14 1159) SpO2:  [97 %-99 %] 98 % (02/14 1159)  Weight change:  Filed Weights   02/09/20 0911 02/09/20 1128  Weight: 98 kg 98 kg    Intake/Output: I/O last 3 completed shifts: In: 1632.3 [P.O.:900; I.V.:619.9; Other:10; IV Piggyback:102.5] Out: 2555 [Urine:2525; Drains:30]   Intake/Output this shift:  Total I/O In: 360 [P.O.:360] Out: 20 [Drains:20]  Physical Exam: General: No acute distress  Head: Normocephalic, atraumatic. Moist oral mucosal membranes  Eyes: Anicteric  Neck: Supple, trachea midline  Lungs:  Clear to auscultation, normal effort  Heart: S1S2 no rubs  Abdomen:  Soft, nontender, abdominal incision noted.  Drain in place.  Extremities: No peripheral edema.  Neurologic: Awake, alert, following commands  Skin: No lesions       Basic Metabolic Panel: Recent Labs  Lab 02/10/20 0556 02/10/20 0556 02/11/20 0241 02/11/20 0241 02/12/20 0807 02/13/20 0413 02/14/20 0435  NA 140  --  145  --  142 141 141  K 3.6  --  3.5  --  3.0* 3.1* 3.6  CL 102  --  106  --  108 109 110  CO2 23  --  23  --  22 22 21*  GLUCOSE 118*  --  101*  --  169* 147* 175*  BUN 79*  --  87*  --  72* 55* 43*  CREATININE 6.92*  --  5.85*  --  3.69* 2.86* 2.29*  CALCIUM 7.8*   < > 8.6*   < > 8.9 9.1 8.9  MG 2.1  --  2.3  --   --  2.0  --   PHOS 9.3*  --  8.7*  --   --  3.2  --    < > = values in this interval not displayed.    Liver Function Tests: Recent Labs  Lab 02/09/20 0914 02/10/20 0556  AST 19 21  ALT 17 15  ALKPHOS 111 67  BILITOT 0.9 0.8   PROT 9.1* 6.1*  ALBUMIN 3.3* 2.2*   Recent Labs  Lab 02/09/20 0931  LIPASE 19   No results for input(s): AMMONIA in the last 168 hours.  CBC: Recent Labs  Lab 02/09/20 0914 02/10/20 0556 02/11/20 0241 02/12/20 0807 02/13/20 0413  WBC 26.1* 12.8* 11.2* 9.0 8.4  NEUTROABS 21.0*  --   --   --   --   HGB 13.9 10.9* 10.1* 9.8* 10.0*  HCT 41.9 33.5* 31.1* 30.7* 31.6*  MCV 89.3 90.8 91.7 93.6 93.8  PLT 419* 304 321 337 350    Cardiac Enzymes: No results for input(s): CKTOTAL, CKMB, CKMBINDEX, TROPONINI in the last 168 hours.  BNP: Invalid input(s): POCBNP  CBG: Recent Labs  Lab 02/13/20 1139 02/13/20 1702 02/13/20 2101 02/14/20 0842 02/14/20 1156  GLUCAP 166* 158* 173* 144* 166*    Microbiology: Results for orders placed or performed during the hospital encounter of 02/09/20  Respiratory Panel by RT PCR (Flu A&B, Covid) - Nasopharyngeal Swab     Status: None   Collection Time: 02/09/20  9:55 AM   Specimen: Nasopharyngeal Swab  Result Value Ref Range Status   SARS Coronavirus 2 by RT PCR NEGATIVE NEGATIVE Final    Comment: (NOTE) SARS-CoV-2 target nucleic acids are NOT DETECTED. The SARS-CoV-2 RNA is generally detectable in upper respiratoy specimens during the acute phase of infection. The lowest concentration of SARS-CoV-2 viral copies this assay can detect is 131 copies/mL. A negative result does not preclude SARS-Cov-2 infection and should not be used as the sole basis for treatment or other patient management decisions. A negative result may occur with  improper specimen collection/handling, submission of specimen other than nasopharyngeal swab, presence of viral mutation(s) within the areas targeted by this assay, and inadequate number of viral copies (<131 copies/mL). A negative result must be combined with clinical observations, patient history, and epidemiological information. The expected result is Negative. Fact Sheet for Patients:   PinkCheek.be Fact Sheet for Healthcare Providers:  GravelBags.it This test is not yet ap proved or cleared by the [place] FDA and  has been authorized for detection and/or diagnosis of SARS-CoV-2 by FDA under an Emergency Use Authorization (EUA). This EUA will remain  in effect (meaning this test can be used) for the duration of the COVID-19 declaration under Section 564(b)(1) of the Act, 21 U.S.C. section 360bbb-3(b)(1), unless the authorization is terminated or revoked sooner.    Influenza A by PCR NEGATIVE NEGATIVE Final   Influenza B by PCR NEGATIVE NEGATIVE Final    Comment: (NOTE) The Xpert Xpress SARS-CoV-2/FLU/RSV assay is intended as an aid in  the diagnosis of influenza from Nasopharyngeal swab specimens and  should not be used as a sole basis for treatment. Nasal washings and  aspirates are unacceptable for Xpert Xpress SARS-CoV-2/FLU/RSV  testing. Fact Sheet for Patients: PinkCheek.be Fact Sheet for Healthcare Providers: GravelBags.it This test is not yet approved or cleared by the [place] FDA and  has been authorized for detection and/or diagnosis of SARS-CoV-2 by  FDA under an Emergency Use Authorization (EUA). This EUA will remain  in effect (meaning this test can be used) for the duration of the  Covid-19 declaration under Section 564(b)(1) of the Act, 21  U.S.C. section 360bbb-3(b)(1), unless the authorization is  terminated or revoked. Performed at [facility], [address] [place], [place] [address]     Coagulation Studies: No results for input(s): LABPROT, INR in the last 72 hours.  Urinalysis: Recent Labs    02/13/20 0020  COLORURINE YELLOW*  LABSPEC 1.014  PHURINE 5.0  GLUCOSEU NEGATIVE  HGBUR MODERATE*  BILIRUBINUR NEGATIVE  KETONESUR NEGATIVE  PROTEINUR 100*  NITRITE NEGATIVE  LEUKOCYTESUR TRACE*       Imaging: No results found.   Medications:   . piperacillin-tazobactam (ZOSYN)  IV 3.375 g (02/14/20 0544)   . allopurinol  300 mg Oral Daily  . amLODipine  10 mg Oral Daily  . aspirin EC  81 mg Oral Daily  . carvedilol  6.25 mg Oral BID  . Chlorhexidine Gluconate Cloth  6 each Topical Daily  . heparin injection (subcutaneous)  5,000 Units Subcutaneous Q8H  . hydrALAZINE  25 mg Oral Q8H  . insulin aspart  0-5 Units Subcutaneous QHS  . insulin aspart  0-9 Units Subcutaneous TID WC  . insulin glargine  10 Units Subcutaneous Daily  . pantoprazole  40 mg Oral Daily   albuterol, hydrALAZINE, HYDROmorphone (DILAUDID) injection, ondansetron **OR** ondansetron (ZOFRAN) IV  Assessment/ Plan:  59 y.o. female with diabetes mellitus type II,hypertension, asthma, GERDwas admitted on 2/9/2021with Incarcerated umbilical hernia [E42.3] SOB (shortness  of breath) [R06.02]s/p strangulated ventral hernia repair, small bowel resection, debridement and excision of skin and subcutaneous tissue on 02/09/20. Nephrology is consulted for acute kidney injury.  1.  Acute kidney injury on chronic kidney disease stage IIIb.  Baseline creatinine 1.89, EGFR 33.  Creatinine now down to 2.2.  Still a bit above baseline.  Encourage patient to maintain good fluid hydration status at home.  She verbalized understanding of this.  We plan to follow-up her acute kidney injury in the office in the next week or 2.  2.  Anemia of chronic kidney disease.  Continue to monitor hemoglobin as an outpatient.  3.  Hypertension.  Continue amlodipine, carvedilol, and hydralazine.  Avoid ACE inhibitor or ARB given renal recovery.   LOS: 5 Jaryd Drew 2/14/20212:45 PM

## 2020-02-19 ENCOUNTER — Other Ambulatory Visit: Payer: Self-pay

## 2020-02-19 ENCOUNTER — Encounter: Payer: Self-pay | Admitting: Surgery

## 2020-02-19 ENCOUNTER — Ambulatory Visit (INDEPENDENT_AMBULATORY_CARE_PROVIDER_SITE_OTHER): Payer: Self-pay | Admitting: Surgery

## 2020-02-19 VITALS — BP 151/82 | HR 82 | Temp 97.2°F | Resp 14 | Ht <= 58 in | Wt 202.2 lb

## 2020-02-19 DIAGNOSIS — Z09 Encounter for follow-up examination after completed treatment for conditions other than malignant neoplasm: Secondary | ICD-10-CM

## 2020-02-19 DIAGNOSIS — K436 Other and unspecified ventral hernia with obstruction, without gangrene: Secondary | ICD-10-CM

## 2020-02-19 NOTE — Patient Instructions (Addendum)
Dr.Piscoya advised patient that he would not removed drain at today's visit as her output is still at twice the amount.   Dr.Piscoya will remove half of the staples at today's visit, due to the skin weakness from the surgery. To prevent the wound from opening completely.   Dr.Piscoya advised patient she should increase her protein diet such as protein shakes to help with the healing process.   High-Protein and High-Calorie Diet Eating high-protein and high-calorie foods can help you to gain weight, heal after an injury, and recover after an illness or surgery. The specific amount of daily protein and calories you need depends on:  Your body weight.  The reason this diet is recommended for you. What is my plan? Generally, a high-protein, high-calorie diet involves:  Eating 250-500 extra calories each day.  Making sure that you get enough of your daily calories from protein. Ask your health care provider how many of your calories should come from protein. Talk with a health care provider, such as a diet and nutrition specialist (dietitian), about how much protein and how many calories you need each day. Follow the diet as directed by your health care provider. What are tips for following this plan?  Preparing meals  Add whole milk, half-and-half, or heavy cream to cereal, pudding, soup, or hot cocoa.  Add whole milk to instant breakfast drinks.  Add peanut butter to oatmeal or smoothies.  Add powdered milk to baked goods, smoothies, or milkshakes.  Add powdered milk, cream, or butter to mashed potatoes.  Add cheese to cooked vegetables.  Make whole-milk yogurt parfaits. Top them with granola, fruit, or nuts.  Add cottage cheese to your fruit.  Add avocado, cheese, or both to sandwiches or salads.  Add meat, poultry, or seafood to rice, pasta, casseroles, salads, and soups.  Use mayonnaise when making egg salad, chicken salad, or tuna salad.  Use peanut butter as a dip for  vegetables or as a topping for pretzels, celery, or crackers.  Add beans to casseroles, dips, and spreads.  Add pureed beans to sauces and soups.  Replace calorie-free drinks with calorie-containing drinks, such as milk and fruit juice.  Replace water with milk or heavy cream when making foods such as oatmeal, pudding, or cocoa. General instructions  Ask your health care provider if you should take a nutritional supplement.  Try to eat six small meals each day instead of three large meals.  Eat a balanced diet. In each meal, include one food that is high in protein.  Keep nutritious snacks available, such as nuts, trail mixes, dried fruit, and yogurt.  If you have kidney disease or diabetes, talk with your health care provider about how much protein is safe for you. Too much protein may put extra stress on your kidneys.  Drink your calories. Choose high-calorie drinks and have them after your meals. What high-protein foods should I eat?  Vegetables Soybeans. Peas. Grains Quinoa. Bulgur wheat. Meats and other proteins Beef, pork, and poultry. Fish and seafood. Eggs. Tofu. Textured vegetable protein (TVP). Peanut butter. Nuts and seeds. Dried beans. Protein powders. Dairy Whole milk. Whole-milk yogurt. Powdered milk. Cheese. Yahoo. Eggnog. Beverages High-protein supplement drinks. Soy milk. Other foods Protein bars. The items listed above may not be a complete list of high-protein foods and beverages. Contact a dietitian for more options. What high-calorie foods should I eat? Fruits Dried fruit. Fruit leather. Canned fruit in syrup. Fruit juice. Avocado. Vegetables Vegetables cooked in oil or butter. Maceo Pro  potatoes. Grains Pasta. Quick breads. Muffins. Pancakes. Ready-to-eat cereal. Meats and other proteins Peanut butter. Nuts and seeds. Dairy Heavy cream. Whipped cream. Cream cheese. Sour cream. Ice cream. Custard. Pudding. Beverages Meal-replacement  beverages. Nutrition shakes. Fruit juice. Sugar-sweetened soft drinks. Seasonings and condiments Salad dressing. Mayonnaise. Alfredo sauce. Fruit preserves or jelly. Honey. Syrup. Sweets and desserts Cake. Cookies. Pie. Pastries. Candy bars. Chocolate. Fats and oils Butter or margarine. Oil. Gravy. Other foods Meal-replacement bars. The items listed above may not be a complete list of high-calorie foods and beverages. Contact a dietitian for more options. Summary  A high-protein, high-calorie diet can help you gain weight or heal faster after an injury, illness, or surgery.  To increase your protein and calories, add ingredients such as whole milk, peanut butter, cheese, beans, meat, or seafood to meal items.  To get enough extra calories each day, include high-calorie foods and beverages at each meal.  Adding a high-calorie drink or shake can be an easy way to help you get enough calories each day. Talk with your healthcare provider or dietitian about the best options for you. This information is not intended to replace advice given to you by your health care provider. Make sure you discuss any questions you have with your health care provider. Document Revised: 11/29/2017 Document Reviewed: 10/29/2017 Elsevier Patient Education  2020 [employer].

## 2020-02-21 ENCOUNTER — Encounter: Payer: Self-pay | Admitting: Surgery

## 2020-02-21 NOTE — Progress Notes (Signed)
02/19/2020  HPI: Brianna [name] is a 59 y.o. female s/p strangulated ventral hernia repair with small bowel resection on 02/09/20.  She was discharged on 2/14 after recovering from severe AKI.  She presents for follow up.  Reports she's been doing well, without any significant pain.  Having good bowel function.  Has been emptying her drain daily and denies any issues.  She did not bring the drain record sheet with her but has been draining about half bulb once or twice daily.  Vital signs: BP (!) 151/82   Pulse 82   Temp (!) 97.2 F (36.2 C) (Temporal)   Resp 14   Ht 4\' 9"  (1.448 m)   Wt 202 lb 3.2 oz (91.7 kg)   SpO2 98%   BMI 43.76 kg/m    Physical Exam: Constitutional: No acute distress Abdomen:  Soft, non-distended, non-tender.  Midline incision healing well, without any breakdown or infection.  Staples in place.  Removed about half the staples and replaced with steri strips.  Blake drain with serosanguinous fluid.  Assessment/Plan: This is a 58 y.o. female s/p strangulated ventral hernia repair with small bowel resection.  --Discussed with her that that drain output is still too high to remove the drain.  She should record and bring the drain record with her.  I did remove half the staples and replaced with steri strips. --She will follow up next week to remove the remaining staples and hopefully the drain as well. --Recommended that she increase her protein intake to help with the wound healing and hernia repair healing.  Still no heavy lifting or pushing of no more than 10-15 lbs for a total of 6 weeks.   Melvyn Neth, [facility]

## 2020-02-24 ENCOUNTER — Other Ambulatory Visit: Payer: Self-pay

## 2020-02-24 ENCOUNTER — Ambulatory Visit (INDEPENDENT_AMBULATORY_CARE_PROVIDER_SITE_OTHER): Payer: Self-pay | Admitting: Surgery

## 2020-02-24 ENCOUNTER — Encounter: Payer: Self-pay | Admitting: Surgery

## 2020-02-24 VITALS — BP 135/71 | HR 79 | Temp 97.7°F | Resp 12 | Ht <= 58 in | Wt 198.8 lb

## 2020-02-24 DIAGNOSIS — K436 Other and unspecified ventral hernia with obstruction, without gangrene: Secondary | ICD-10-CM

## 2020-02-24 DIAGNOSIS — Z09 Encounter for follow-up examination after completed treatment for conditions other than malignant neoplasm: Secondary | ICD-10-CM

## 2020-02-24 NOTE — Patient Instructions (Addendum)
Dr Hampton Abbot removed your drain and remaining staple today. On the incision site you can place a dry gauze to the area once a day as needed. When removing your staples we placed new steri-strips on the incision site. You may continue to shower normally let water and soap rinse over the area.   You may start to take Imodium to help with your diarrhea.  We will see you in 2 weeks to follow up. Please see your appointments below.  Please call the office if you have any questions or concerns.   Wound Care, Adult Taking care of your wound properly can help to prevent pain, infection, and scarring. It can also help your wound to heal more quickly. How to care for your wound Wound care      Follow instructions from your health care provider about how to take care of your wound. Make sure you: ? Wash your hands with soap and water before you change the bandage (dressing). If soap and water are not available, use hand sanitizer. ? Change your dressing as told by your health care provider. ? Leave stitches (sutures), skin glue, or adhesive strips in place. These skin closures may need to stay in place for 2 weeks or longer. If adhesive strip edges start to loosen and curl up, you may trim the loose edges. Do not remove adhesive strips completely unless your health care provider tells you to do that.  Check your wound area every day for signs of infection. Check for: ? Redness, swelling, or pain. ? Fluid or blood. ? Warmth. ? Pus or a bad smell.  Ask your health care provider if you should clean the wound with mild soap and water. Doing this may include: ? Using a clean towel to pat the wound dry after cleaning it. Do not rub or scrub the wound. ? Applying a cream or ointment. Do this only as told by your health care provider. ? Covering the incision with a clean dressing.  Ask your health care provider when you can leave the wound uncovered.  Keep the dressing dry until your health care  provider says it can be removed. Do not take baths, swim, use a hot tub, or do anything that would put the wound underwater until your health care provider approves. Ask your health care provider if you can take showers. You may only be allowed to take sponge baths. Medicines   If you were prescribed an antibiotic medicine, cream, or ointment, take or use the antibiotic as told by your health care provider. Do not stop taking or using the antibiotic even if your condition improves.  Take over-the-counter and prescription medicines only as told by your health care provider. If you were prescribed pain medicine, take it 30 or more minutes before you do any wound care or as told by your health care provider. General instructions  Return to your normal activities as told by your health care provider. Ask your health care provider what activities are safe.  Do not scratch or pick at the wound.  Do not use any products that contain nicotine or tobacco, such as cigarettes and e-cigarettes. These may delay wound healing. If you need help quitting, ask your health care provider.  Keep all follow-up visits as told by your health care provider. This is important.  Eat a diet that includes protein, vitamin A, vitamin C, and other nutrient-rich foods to help the wound heal. ? Foods rich in protein include meat, dairy, beans, nuts,  and other sources. ? Foods rich in vitamin A include carrots and dark green, leafy vegetables. ? Foods rich in vitamin C include citrus, tomatoes, and other fruits and vegetables. ? Nutrient-rich foods have protein, carbohydrates, fat, vitamins, or minerals. Eat a variety of healthy foods including vegetables, fruits, and whole grains. Contact a health care provider if:  You received a tetanus shot and you have swelling, severe pain, redness, or bleeding at the injection site.  Your pain is not controlled with medicine.  You have redness, swelling, or pain around the  wound.  You have fluid or blood coming from the wound.  Your wound feels warm to the touch.  You have pus or a bad smell coming from the wound.  You have a fever or chills.  You are nauseous or you vomit.  You are dizzy. Get help right away if:  You have a red streak going away from your wound.  The edges of the wound open up and separate.  Your wound is bleeding, and the bleeding does not stop with gentle pressure.  You have a rash.  You faint.  You have trouble breathing. Summary  Always wash your hands with soap and water before changing your bandage (dressing).  To help with healing, eat foods that are rich in protein, vitamin A, vitamin C, and other nutrients.  Check your wound every day for signs of infection. Contact your health care provider if you suspect that your wound is infected. This information is not intended to replace advice given to you by your health care provider. Make sure you discuss any questions you have with your health care provider. Document Revised: 04/06/2019 Document Reviewed: 07/03/2016 Elsevier Patient Education  [employer].

## 2020-02-24 NOTE — Progress Notes (Signed)
02/24/2020  HPI: Brianna [name] is a 59 y.o. female s/p strangulated ventral hernia repair with small bowel resection on 02/09/20.  She presents for follow up.  She was last seen on 2/19, at which time, half her midline staples were removed.  Her drain remained in place due to high volume output still.  Today, she reports that the drain has been having low volume over the past few days.  She has been taking more protein, and is having some diarrhea as well.  Denies any worsening pain or issues with the incision.  Vital signs: BP 135/71   Pulse 79   Temp 97.7 F (36.5 C) (Temporal)   Resp 12   Ht 4\' 9"  (1.448 m)   Wt 198 lb 12.8 oz (90.2 kg)   SpO2 98%   BMI 43.02 kg/m    Physical Exam: Constitutional:  No acute distress Abdomen:  Soft, obese, non-distended, non-tender to palpation.  Drain with serosanguinous fluid.  Midline wound with steri strips and staples.  Remaining staples removed and changed to steri strips.  Drain removed without complications and dry gauze dressing placed  Assessment/Plan: This is a 59 y.o. female s/p strangulated ventral hernia repair with small bowel resection.  --Drain removed, staples removed.  No issues with the incision or drain site.  Instructed on dry gauze dressing changes daily to the drain site until fully healed. --Continue activity restriction -- no heavy lifting or pushing of no more than 10-15 lbs. --May take Imodium to help with any diarrhea, as needed. --Follow up in two weeks to check on her healing and progress.   Melvyn Neth, [facility]

## 2020-02-26 ENCOUNTER — Encounter: Payer: Self-pay | Admitting: Surgery

## 2020-03-11 ENCOUNTER — Other Ambulatory Visit: Payer: Self-pay

## 2020-03-11 ENCOUNTER — Encounter: Payer: Self-pay | Admitting: Surgery

## 2020-03-11 ENCOUNTER — Ambulatory Visit (INDEPENDENT_AMBULATORY_CARE_PROVIDER_SITE_OTHER): Payer: Self-pay | Admitting: Surgery

## 2020-03-11 VITALS — BP 143/90 | HR 86 | Temp 97.7°F | Resp 12 | Ht <= 58 in | Wt 199.4 lb

## 2020-03-11 DIAGNOSIS — Z09 Encounter for follow-up examination after completed treatment for conditions other than malignant neoplasm: Secondary | ICD-10-CM

## 2020-03-11 DIAGNOSIS — K436 Other and unspecified ventral hernia with obstruction, without gangrene: Secondary | ICD-10-CM

## 2020-03-11 NOTE — Patient Instructions (Addendum)
Dr.Piscoya advised patient that with time the wound will heal. He suggested the patient to cover the small wound with a band-aid. If patient has any questions or concerns, please do not hesitate to give our office a call.    Follow-up with our office as needed.  Please call and ask to speak with a nurse if you develop questions or concerns.

## 2020-03-11 NOTE — Progress Notes (Signed)
03/11/2020  HPI: Brianna [name] is a 59 y.o. female s/p strangulated ventral hernia repair.  Presents for follow up.  She's been doing well, without complaints.  No significant pain, no nausea or vomiting, normal bowel function.  Reports sometimes the drain site is has a skin sore, but otherwise no issues.  Vital signs: BP (!) 143/90   Pulse 86   Temp 97.7 F (36.5 C) (Temporal)   Resp 12   Ht 4\' 9"  (1.448 m)   Wt 199 lb 6.4 oz (90.4 kg)   SpO2 98%   BMI 43.15 kg/m    Physical Exam: Constitutional: No acute distress Abdomen:  Soft, obese, non-distended, non-tender to palpation.  Midline incision healing well, without any evidence of hernia recurrence.  Drain site with a scab but otherwise no drainage or erythema.  Assessment/Plan: This is a 59 y.o. female s/p strangulated ventral hernia repair.  --Patient is doing very well, without complications. --Recommended that if she wants, she can place a BandAid over the drain site scab, but otherwise, it'll heal on its own. --May resume normal activities. --May return to work.  Work note given. --Follow up prn.   Melvyn Neth, [facility]

## 2021-01-11 ENCOUNTER — Encounter: Payer: Self-pay | Admitting: *Deleted

## 2021-01-11 ENCOUNTER — Other Ambulatory Visit: Payer: Self-pay

## 2021-01-11 DIAGNOSIS — Z7982 Long term (current) use of aspirin: Secondary | ICD-10-CM

## 2021-01-11 DIAGNOSIS — Z6841 Body Mass Index (BMI) 40.0 and over, adult: Secondary | ICD-10-CM

## 2021-01-11 DIAGNOSIS — Z794 Long term (current) use of insulin: Secondary | ICD-10-CM

## 2021-01-11 DIAGNOSIS — E8770 Fluid overload, unspecified: Principal | ICD-10-CM | POA: Diagnosis present

## 2021-01-11 DIAGNOSIS — E1165 Type 2 diabetes mellitus with hyperglycemia: Secondary | ICD-10-CM | POA: Diagnosis present

## 2021-01-11 DIAGNOSIS — J45909 Unspecified asthma, uncomplicated: Secondary | ICD-10-CM | POA: Diagnosis present

## 2021-01-11 DIAGNOSIS — E669 Obesity, unspecified: Secondary | ICD-10-CM | POA: Diagnosis present

## 2021-01-11 DIAGNOSIS — K219 Gastro-esophageal reflux disease without esophagitis: Secondary | ICD-10-CM | POA: Diagnosis present

## 2021-01-11 DIAGNOSIS — E1122 Type 2 diabetes mellitus with diabetic chronic kidney disease: Secondary | ICD-10-CM | POA: Diagnosis present

## 2021-01-11 DIAGNOSIS — Z23 Encounter for immunization: Secondary | ICD-10-CM

## 2021-01-11 DIAGNOSIS — Z79899 Other long term (current) drug therapy: Secondary | ICD-10-CM

## 2021-01-11 DIAGNOSIS — M109 Gout, unspecified: Secondary | ICD-10-CM | POA: Diagnosis present

## 2021-01-11 DIAGNOSIS — N184 Chronic kidney disease, stage 4 (severe): Secondary | ICD-10-CM | POA: Diagnosis present

## 2021-01-11 DIAGNOSIS — I129 Hypertensive chronic kidney disease with stage 1 through stage 4 chronic kidney disease, or unspecified chronic kidney disease: Secondary | ICD-10-CM | POA: Diagnosis present

## 2021-01-11 DIAGNOSIS — Z20822 Contact with and (suspected) exposure to covid-19: Secondary | ICD-10-CM | POA: Diagnosis present

## 2021-01-11 DIAGNOSIS — R0602 Shortness of breath: Secondary | ICD-10-CM | POA: Diagnosis not present

## 2021-01-11 DIAGNOSIS — E876 Hypokalemia: Secondary | ICD-10-CM | POA: Diagnosis present

## 2021-01-11 LAB — BASIC METABOLIC PANEL
Anion gap: 11 (ref 5–15)
BUN: 36 mg/dL — ABNORMAL HIGH (ref 6–20)
CO2: 22 mmol/L (ref 22–32)
Calcium: 9.3 mg/dL (ref 8.9–10.3)
Chloride: 107 mmol/L (ref 98–111)
Creatinine, Ser: 2.31 mg/dL — ABNORMAL HIGH (ref 0.44–1.00)
GFR, Estimated: 24 mL/min — ABNORMAL LOW (ref >60)
Glucose, Bld: 131 mg/dL — ABNORMAL HIGH (ref 70–99)
Potassium: 3.3 mmol/L — ABNORMAL LOW (ref 3.5–5.1)
Sodium: 140 mmol/L (ref 135–145)

## 2021-01-11 LAB — CBC
HCT: 30.8 % — ABNORMAL LOW (ref 36.0–46.0)
Hemoglobin: 10.1 g/dL — ABNORMAL LOW (ref 12.0–15.0)
MCH: 29.5 pg (ref 26.0–34.0)
MCHC: 32.8 g/dL (ref 30.0–36.0)
MCV: 90.1 fL (ref 80.0–100.0)
Platelets: 295 10*3/uL (ref 150–400)
RBC: 3.42 MIL/uL — ABNORMAL LOW (ref 3.87–5.11)
RDW: 15.8 % — ABNORMAL HIGH (ref 11.5–15.5)
WBC: 5.9 10*3/uL (ref 4.0–10.5)
nRBC: 0 % (ref 0.0–0.2)

## 2021-01-11 MED ORDER — ALBUTEROL SULFATE (2.5 MG/3ML) 0.083% IN NEBU: 5.0000 mg | INHALATION_SOLUTION | Freq: Once | RESPIRATORY_TRACT | Status: AC

## 2021-01-11 MED ORDER — ALBUTEROL SULFATE HFA 108 (90 BASE) MCG/ACT IN AERS: 2.0000 | INHALATION_SPRAY | RESPIRATORY_TRACT | Status: DC | PRN

## 2021-01-11 MED ORDER — ALBUTEROL SULFATE (2.5 MG/3ML) 0.083% IN NEBU
INHALATION_SOLUTION | RESPIRATORY_TRACT | Status: AC
  Administered 2021-01-11: 5 mg via RESPIRATORY_TRACT
  Filled 2021-01-11: qty 6

## 2021-01-11 NOTE — ED Triage Notes (Signed)
Worsening SOB x 2 weeks. Lung sounds diminished bilaterally. Pt has noted increased WOB in triage and reports hx of asthma. Recent CXR on 1/10 was unremarkable and is in care everywhere. No cough or fevers. Pt has been using home inhaler without relief. Last dose of steroids was 1 month ago.

## 2021-01-12 ENCOUNTER — Emergency Department: Payer: BC Managed Care – PPO

## 2021-01-12 ENCOUNTER — Inpatient Hospital Stay
Admission: EM | Admit: 2021-01-12 | Discharge: 2021-01-19 | DRG: 641 | Disposition: A | Discharge status: Home / Self Care | Payer: BC Managed Care – PPO | Attending: Internal Medicine | Admitting: Internal Medicine

## 2021-01-12 ENCOUNTER — Inpatient Hospital Stay
Admit: 2021-01-12 | Discharge: 2021-01-12 | Disposition: A | Discharge status: Home / Self Care | Payer: BC Managed Care – PPO | Attending: Internal Medicine | Admitting: Internal Medicine

## 2021-01-12 DIAGNOSIS — R5381 Other malaise: Secondary | ICD-10-CM

## 2021-01-12 DIAGNOSIS — N179 Acute kidney failure, unspecified: Secondary | ICD-10-CM

## 2021-01-12 DIAGNOSIS — I129 Hypertensive chronic kidney disease with stage 1 through stage 4 chronic kidney disease, or unspecified chronic kidney disease: Secondary | ICD-10-CM | POA: Diagnosis present

## 2021-01-12 DIAGNOSIS — E876 Hypokalemia: Secondary | ICD-10-CM | POA: Diagnosis present

## 2021-01-12 DIAGNOSIS — R0602 Shortness of breath: Secondary | ICD-10-CM

## 2021-01-12 DIAGNOSIS — J189 Pneumonia, unspecified organism: Secondary | ICD-10-CM

## 2021-01-12 DIAGNOSIS — E669 Obesity, unspecified: Secondary | ICD-10-CM | POA: Diagnosis present

## 2021-01-12 DIAGNOSIS — E1122 Type 2 diabetes mellitus with diabetic chronic kidney disease: Secondary | ICD-10-CM | POA: Diagnosis present

## 2021-01-12 DIAGNOSIS — Z79899 Other long term (current) drug therapy: Secondary | ICD-10-CM | POA: Diagnosis not present

## 2021-01-12 DIAGNOSIS — N184 Chronic kidney disease, stage 4 (severe): Secondary | ICD-10-CM | POA: Diagnosis present

## 2021-01-12 DIAGNOSIS — J45909 Unspecified asthma, uncomplicated: Secondary | ICD-10-CM | POA: Diagnosis present

## 2021-01-12 DIAGNOSIS — Z6841 Body Mass Index (BMI) 40.0 and over, adult: Secondary | ICD-10-CM | POA: Diagnosis not present

## 2021-01-12 DIAGNOSIS — E1129 Type 2 diabetes mellitus with other diabetic kidney complication: Secondary | ICD-10-CM | POA: Diagnosis present

## 2021-01-12 DIAGNOSIS — I10 Essential (primary) hypertension: Secondary | ICD-10-CM | POA: Diagnosis present

## 2021-01-12 DIAGNOSIS — R600 Localized edema: Secondary | ICD-10-CM | POA: Diagnosis present

## 2021-01-12 DIAGNOSIS — E8770 Fluid overload, unspecified: Secondary | ICD-10-CM | POA: Diagnosis present

## 2021-01-12 DIAGNOSIS — K219 Gastro-esophageal reflux disease without esophagitis: Secondary | ICD-10-CM | POA: Diagnosis present

## 2021-01-12 DIAGNOSIS — D631 Anemia in chronic kidney disease: Secondary | ICD-10-CM | POA: Diagnosis present

## 2021-01-12 DIAGNOSIS — J180 Bronchopneumonia, unspecified organism: Secondary | ICD-10-CM | POA: Diagnosis present

## 2021-01-12 DIAGNOSIS — Z794 Long term (current) use of insulin: Secondary | ICD-10-CM | POA: Diagnosis not present

## 2021-01-12 DIAGNOSIS — M109 Gout, unspecified: Secondary | ICD-10-CM | POA: Diagnosis present

## 2021-01-12 DIAGNOSIS — E1165 Type 2 diabetes mellitus with hyperglycemia: Secondary | ICD-10-CM | POA: Diagnosis present

## 2021-01-12 DIAGNOSIS — Z20822 Contact with and (suspected) exposure to covid-19: Secondary | ICD-10-CM | POA: Diagnosis present

## 2021-01-12 DIAGNOSIS — Z7982 Long term (current) use of aspirin: Secondary | ICD-10-CM | POA: Diagnosis not present

## 2021-01-12 DIAGNOSIS — R0600 Dyspnea, unspecified: Secondary | ICD-10-CM | POA: Diagnosis present

## 2021-01-12 DIAGNOSIS — R0609 Other forms of dyspnea: Secondary | ICD-10-CM | POA: Diagnosis present

## 2021-01-12 DIAGNOSIS — Z23 Encounter for immunization: Secondary | ICD-10-CM | POA: Diagnosis not present

## 2021-01-12 LAB — ECHOCARDIOGRAM COMPLETE
AR max vel: 2.2 cm2
AV Area VTI: 2.15 cm2
AV Area mean vel: 2.03 cm2
AV Mean grad: 4 mmHg
AV Peak grad: 6.4 mmHg
Ao pk vel: 1.26 m/s
Area-P 1/2: 3.56 cm2
S' Lateral: 3.01 cm

## 2021-01-12 LAB — CBG MONITORING, ED
Glucose-Capillary: 119 mg/dL — ABNORMAL HIGH (ref 70–99)
Glucose-Capillary: 150 mg/dL — ABNORMAL HIGH (ref 70–99)
Glucose-Capillary: 178 mg/dL — ABNORMAL HIGH (ref 70–99)

## 2021-01-12 LAB — MAGNESIUM: Magnesium: 1.4 mg/dL — ABNORMAL LOW (ref 1.7–2.4)

## 2021-01-12 LAB — SARS CORONAVIRUS 2 (TAT 6-24 HRS): SARS Coronavirus 2: NEGATIVE

## 2021-01-12 LAB — BRAIN NATRIURETIC PEPTIDE: B Natriuretic Peptide: 16.8 pg/mL (ref 0.0–100.0)

## 2021-01-12 MED ORDER — SODIUM CHLORIDE 0.9 % IV SOLN
500.0000 mg | Freq: Once | INTRAVENOUS | Status: AC
  Administered 2021-01-12: 500 mg via INTRAVENOUS
  Filled 2021-01-12: qty 500

## 2021-01-12 MED ORDER — PANTOPRAZOLE SODIUM 40 MG PO TBEC
40.0000 mg | DELAYED_RELEASE_TABLET | Freq: Every day | ORAL | Status: DC
  Administered 2021-01-12 – 2021-01-19 (×8): 40 mg via ORAL
  Filled 2021-01-12 (×9): qty 1

## 2021-01-12 MED ORDER — CARVEDILOL 6.25 MG PO TABS
6.2500 mg | ORAL_TABLET | Freq: Two times a day (BID) | ORAL | Status: DC
  Administered 2021-01-12 – 2021-01-19 (×14): 6.25 mg via ORAL
  Filled 2021-01-12 (×14): qty 1

## 2021-01-12 MED ORDER — FUROSEMIDE 40 MG PO TABS
40.0000 mg | ORAL_TABLET | Freq: Every day | ORAL | Status: DC
  Administered 2021-01-13: 40 mg via ORAL
  Filled 2021-01-12: qty 1

## 2021-01-12 MED ORDER — ONDANSETRON HCL 4 MG/2ML IJ SOLN: 4.0000 mg | Freq: Three times a day (TID) | INTRAMUSCULAR | Status: DC | PRN

## 2021-01-12 MED ORDER — DM-GUAIFENESIN ER 30-600 MG PO TB12
1.0000 | ORAL_TABLET | Freq: Two times a day (BID) | ORAL | Status: DC | PRN
  Administered 2021-01-13 – 2021-01-17 (×7): 1 via ORAL
  Filled 2021-01-12 (×7): qty 1

## 2021-01-12 MED ORDER — AMLODIPINE BESYLATE 5 MG PO TABS
10.0000 mg | ORAL_TABLET | Freq: Every day | ORAL | Status: DC
  Administered 2021-01-12 – 2021-01-19 (×8): 10 mg via ORAL
  Filled 2021-01-12 (×8): qty 2

## 2021-01-12 MED ORDER — ACETAMINOPHEN 325 MG PO TABS
650.0000 mg | ORAL_TABLET | Freq: Four times a day (QID) | ORAL | Status: DC | PRN
  Administered 2021-01-12 – 2021-01-19 (×4): 650 mg via ORAL
  Filled 2021-01-12 (×5): qty 2

## 2021-01-12 MED ORDER — ALLOPURINOL 300 MG PO TABS
300.0000 mg | ORAL_TABLET | Freq: Every day | ORAL | Status: DC
Start: 2021-01-13 — End: 2021-01-19
  Administered 2021-01-13 – 2021-01-19 (×7): 300 mg via ORAL
  Filled 2021-01-12 (×2): qty 3
  Filled 2021-01-12 (×2): qty 1
  Filled 2021-01-12 (×3): qty 3
  Filled 2021-01-12 (×2): qty 1
  Filled 2021-01-12: qty 3
  Filled 2021-01-12 (×3): qty 1

## 2021-01-12 MED ORDER — ALBUTEROL SULFATE HFA 108 (90 BASE) MCG/ACT IN AERS
2.0000 | INHALATION_SPRAY | RESPIRATORY_TRACT | Status: DC | PRN
  Filled 2021-01-12: qty 6.7

## 2021-01-12 MED ORDER — ASPIRIN EC 81 MG PO TBEC
81.0000 mg | DELAYED_RELEASE_TABLET | Freq: Every day | ORAL | Status: DC
  Administered 2021-01-12 – 2021-01-19 (×8): 81 mg via ORAL
  Filled 2021-01-12 (×8): qty 1

## 2021-01-12 MED ORDER — SODIUM CHLORIDE 0.9 % IV SOLN
1.0000 g | Freq: Once | INTRAVENOUS | Status: AC
  Administered 2021-01-12: 1 g via INTRAVENOUS
  Filled 2021-01-12: qty 10

## 2021-01-12 MED ORDER — HYDRALAZINE HCL 20 MG/ML IJ SOLN
5.0000 mg | INTRAMUSCULAR | Status: DC | PRN
  Administered 2021-01-12 – 2021-01-15 (×2): 5 mg via INTRAVENOUS
  Filled 2021-01-12 (×4): qty 1

## 2021-01-12 MED ORDER — ENOXAPARIN SODIUM 30 MG/0.3ML ~~LOC~~ SOLN
30.0000 mg | SUBCUTANEOUS | Status: DC
  Administered 2021-01-12 – 2021-01-18 (×7): 30 mg via SUBCUTANEOUS
  Filled 2021-01-12 (×8): qty 0.3

## 2021-01-12 MED ORDER — INSULIN ASPART 100 UNIT/ML ~~LOC~~ SOLN
0.0000 [IU] | Freq: Every day | SUBCUTANEOUS | Status: DC
  Administered 2021-01-12 – 2021-01-14 (×2): 2 [IU] via SUBCUTANEOUS
  Administered 2021-01-16: 3 [IU] via SUBCUTANEOUS
  Administered 2021-01-17 – 2021-01-18 (×2): 2 [IU] via SUBCUTANEOUS
  Filled 2021-01-12 (×4): qty 1

## 2021-01-12 MED ORDER — INSULIN ASPART 100 UNIT/ML ~~LOC~~ SOLN
0.0000 [IU] | Freq: Three times a day (TID) | SUBCUTANEOUS | Status: DC
  Administered 2021-01-12: 2 [IU] via SUBCUTANEOUS
  Administered 2021-01-13: 1 [IU] via SUBCUTANEOUS
  Administered 2021-01-13 – 2021-01-14 (×3): 2 [IU] via SUBCUTANEOUS
  Administered 2021-01-14: 1 [IU] via SUBCUTANEOUS
  Administered 2021-01-15: 3 [IU] via SUBCUTANEOUS
  Administered 2021-01-15: 1 [IU] via SUBCUTANEOUS
  Administered 2021-01-15: 2 [IU] via SUBCUTANEOUS
  Administered 2021-01-16 (×2): 3 [IU] via SUBCUTANEOUS
  Administered 2021-01-16: 1 [IU] via SUBCUTANEOUS
  Administered 2021-01-17: 7 [IU] via SUBCUTANEOUS
  Administered 2021-01-17 – 2021-01-18 (×3): 2 [IU] via SUBCUTANEOUS
  Administered 2021-01-18 – 2021-01-19 (×3): 3 [IU] via SUBCUTANEOUS
  Administered 2021-01-19: 2 [IU] via SUBCUTANEOUS
  Filled 2021-01-12 (×20): qty 1

## 2021-01-12 MED ORDER — ENOXAPARIN SODIUM 40 MG/0.4ML ~~LOC~~ SOLN: 40.0000 mg | SUBCUTANEOUS | Status: DC

## 2021-01-12 MED ORDER — POTASSIUM CHLORIDE CRYS ER 20 MEQ PO TBCR
40.0000 meq | EXTENDED_RELEASE_TABLET | Freq: Once | ORAL | Status: AC
  Administered 2021-01-12: 40 meq via ORAL
  Filled 2021-01-12: qty 2

## 2021-01-12 MED ORDER — FUROSEMIDE 10 MG/ML IJ SOLN
60.0000 mg | Freq: Once | INTRAMUSCULAR | Status: AC
  Administered 2021-01-12: 60 mg via INTRAVENOUS
  Filled 2021-01-12: qty 8

## 2021-01-12 MED ORDER — IPRATROPIUM-ALBUTEROL 20-100 MCG/ACT IN AERS
2.0000 | INHALATION_SPRAY | RESPIRATORY_TRACT | Status: DC
  Administered 2021-01-12 – 2021-01-19 (×43): 2 via RESPIRATORY_TRACT
  Filled 2021-01-12: qty 4

## 2021-01-12 MED ORDER — SODIUM CHLORIDE 0.9 % IV SOLN
1.0000 g | INTRAVENOUS | Status: DC
  Administered 2021-01-13: 1 g via INTRAVENOUS
  Filled 2021-01-12: qty 10

## 2021-01-12 MED ORDER — SODIUM CHLORIDE 0.9 % IV SOLN
500.0000 mg | INTRAVENOUS | Status: DC
  Administered 2021-01-13: 500 mg via INTRAVENOUS
  Filled 2021-01-12: qty 500

## 2021-01-12 NOTE — Progress Notes (Signed)
*  PRELIMINARY RESULTS* Echocardiogram 2D Echocardiogram has been performed.  Sherrie Sport 01/12/2021, 2:40 PM

## 2021-01-12 NOTE — ED Notes (Signed)
Pt taken a dinner tray at this time -- pt sitting up to edge of bed; no acute distress.

## 2021-01-12 NOTE — ED Provider Notes (Signed)
[facility] Emergency Department Provider Note   ____________________________________________   Event Date/Time   First MD Initiated Contact with Patient 01/12/21 [phone]     (approximate)  I have reviewed the triage vital signs and the nursing notes.   HISTORY  Chief Complaint Shortness of Breath    HPI Brianna [name] is a 60 y.o. female with history of hypertension, diabetes, chronic kidney disease who presents to the emergency department with complaints of shortness of breath for the past month that has progressively worsened over the past 2 weeks.  States her uncle became very concerned tonight when she looked very short of breath and brought her to the hospital.  She denies any fevers, chills, chest pain.  Has had a nonproductive cough.  Has had lower extremity swelling for several weeks that is symmetric.  No calf tenderness.  No history of PE or DVT.  She is on Lasix 40 mg daily.  She has no known history of CHF.  She has been vaccinated for COVID-19.   States she has been using albuterol nebulizer treatments at home with minimal relief.    Past Medical History:  Diagnosis Date  . Acid reflux   . Asthma   . Diabetes mellitus without complication ([place])   . Hypertension     Patient Active Problem List   Diagnosis Date Noted  . Type 2 diabetes mellitus without complication, with long-term current use of insulin ([place])   . Acute renal failure with tubular necrosis ([place])   . Hypertension   . Strangulated ventral hernia 02/09/2020    Past Surgical History:  Procedure Laterality Date  . BOWEL RESECTION  02/09/2020   Procedure: SMALL BOWEL RESECTION;  Surgeon: Olean Ree, MD;  Location: [facility] ORS;  Service: General;;  . LAPAROTOMY N/A 02/09/2020   Procedure: EXPLORATORY LAPAROTOMY;  Surgeon: Olean Ree, MD;  Location: [facility] ORS;  Service: General;  Laterality: N/A;    Prior to Admission medications   Medication Sig Start Date End Date Taking?  Authorizing Provider  acetaminophen (TYLENOL) 500 MG tablet Take 2 tablets (1,000 mg total) by mouth every 6 (six) hours as needed for mild pain. 02/14/20 02/13/21  Olean Ree, MD  albuterol (PROVENTIL) (2.5 MG/3ML) 0.083% nebulizer solution Take 2.5 mg by nebulization every 4 (four) hours as needed for shortness of breath or wheezing. 12/21/19   [provider]  allopurinol (ZYLOPRIM) 300 MG tablet Take 300 mg by mouth daily. 12/21/19   [provider]  amLODipine (NORVASC) 10 MG tablet Take 10 mg by mouth daily. 12/21/19   [provider]  ASPIRIN ADULT LOW STRENGTH 81 MG EC tablet Take 81 mg by mouth daily. 12/21/19   [provider]  carvedilol (COREG) 12.5 MG tablet Take 1 tablet (12.5 mg total) by mouth 2 (two) times daily. 02/14/20   [name], MD  cetirizine (ZYRTEC) 10 MG tablet Take 10 mg by mouth daily. 09/18/19   [provider]  furosemide (LASIX) 40 MG tablet Take 1 tablet (40 mg total) by mouth daily as needed. For leg swelling or weight gain >5lbs 02/14/20   [name], MD  HUMULIN N 100 UNIT/ML injection Inject 0.2 mLs (20 Units total) into the skin 2 (two) times daily. 02/14/20   [name], MD  hydrochlorothiazide (HYDRODIURIL) 25 MG tablet TAKE 1 Tablet BY MOUTH ONCE EVERY DAY 12/21/19   [provider]  JANUVIA 100 MG tablet Take 100 mg by mouth daily. 12/21/19   [provider]  omeprazole (PRILOSEC) 20 MG capsule Take 20 mg by mouth daily. 12/21/19   [provider]  oxyCODONE (OXY IR/ROXICODONE) 5 MG immediate release tablet Take 1 tablet (5 mg total) by mouth every 4 (four) hours as needed for moderate pain or severe pain. Patient not taking: Reported on 03/11/2020 02/14/20   Olean Ree, MD  PROVENTIL HFA 108 [phone] Base) MCG/ACT inhaler Inhale 2 puffs into the lungs 4 (four) times daily as needed for shortness of breath or wheezing. 12/21/19   [provider]    Allergies Ace inhibitors,  Lisinopril, and Lipitor [atorvastatin]  Family History  Problem Relation Age of Onset  . Breast cancer Neg Hx     Social History Social History   Tobacco Use  . Smoking status: Never Smoker  . Smokeless tobacco: Never Used  Substance Use Topics  . Alcohol use: Never  . Drug use: Never    Review of Systems  Constitutional: No fever/chills Eyes: No visual changes. ENT: No sore throat. Cardiovascular: Denies chest pain. Respiratory: + shortness of breath. Gastrointestinal: No abdominal pain.  No nausea, no vomiting.  No diarrhea.  No constipation. Genitourinary: Negative for dysuria. Musculoskeletal: Negative for back pain. Skin: Negative for rash. Neurological: Negative for headaches, focal weakness or numbness.   ____________________________________________   PHYSICAL EXAM:  VITAL SIGNS: ED Triage Vitals  Enc Vitals Group     BP 01/11/21 2149 (!) 161/82     Pulse Rate 01/11/21 2149 65     Resp 01/11/21 2149 (!) 25     Temp 01/12/21 0214 98.2 F (36.8 C)     Temp Source 01/11/21 2149 Axillary     SpO2 01/11/21 2149 98 %     Weight 01/11/21 2211 199 lb 4.7 oz (90.4 kg)     Height 01/11/21 2211 4\' 9"  (1.448 m)     Head Circumference --      Peak Flow --      Pain Score 01/11/21 2211 0     Pain Loc --      Pain Edu? --      Excl. in [place]? --     Constitutional: Alert and oriented.  Appears short of breath.  Speaking in truncated sentences. Eyes: Conjunctivae are normal. PERRL. EOMI. Head: Atraumatic. Nose: No congestion/rhinnorhea. Mouth/Throat: Mucous membranes are moist.  Oropharynx non-erythematous. Neck: No stridor.   Cardiovascular: Normal rate, regular rhythm. Grossly normal heart sounds.  Good peripheral circulation. Respiratory: Speaking in truncated sentences.  Patient intermittently tachypneic.  No retractions. Lungs CTAB. Gastrointestinal: Soft and nontender. No distention. No abdominal bruits. No CVA tenderness. Musculoskeletal: No lower  extremity calf tenderness on exam.  Patient does have symmetric lower extremity pitting edema to the level of the knees bilaterally. Neurologic:  Normal speech and language. No gross focal neurologic deficits are appreciated. No gait instability. Skin:  Skin is warm, dry and intact. No rash noted. Psychiatric: Mood and affect are normal. Speech and behavior are normal.  ____________________________________________   LABS (all labs ordered are listed, but only abnormal results are displayed)  Labs Reviewed  BASIC METABOLIC PANEL - Abnormal; Notable for the following components:      Result Value   Potassium 3.3 (*)    Glucose, Bld 131 (*)    BUN 36 (*)    Creatinine, Ser 2.31 (*)    GFR, Estimated 24 (*)    All other components within normal limits  CBC - Abnormal; Notable for the following components:   RBC 3.42 (*)  Hemoglobin 10.1 (*)    HCT 30.8 (*)    RDW 15.8 (*)    All other components within normal limits  SARS CORONAVIRUS 2 (TAT 6-24 HRS)  BRAIN NATRIURETIC PEPTIDE   ____________________________________________  EKG   EKG Interpretation  Date/Time:  Wednesday January 11 2021 22:20:05 EST Ventricular Rate:  65 PR Interval:  160 QRS Duration: 74 QT Interval:  358 QTC Calculation: 372 R Axis:   -48 Text Interpretation: Normal sinus rhythm Left axis deviation Low voltage QRS Inferior infarct , age undetermined Cannot rule out Anterior infarct , age undetermined Abnormal ECG No significant change since last tracing Confirmed by Pryor Curia [id number]) on 01/12/2021 3:40:36 AM       ____________________________________________  RADIOLOGY  ED MD interpretation: Pulmonary vascular congestion without overt edema.  No infiltrate.  CT scan shows developing right middle lobe pneumonia.  Official radiology report(s): DG Chest 2 View  Result Date: 01/12/2021 CLINICAL DATA:  Worsening shortness of breath for 2 weeks EXAM: CHEST - 2 VIEW COMPARISON:  Radiograph  02/09/2020 FINDINGS: Mild pulmonary vascular congestion within enlarged, lobular cardiac silhouette. The aorta is calcified. The remaining cardiomediastinal contours are unremarkable. Low volumes and atelectatic changes are present with vascular crowding. More bandlike opacity in the left mid lung, likely atelectatic or scarring. No other focal consolidative process. No pneumothorax or visible effusion. No acute osseous or soft tissue abnormality. Degenerative changes are present in the imaged spine and shoulders. IMPRESSION: 1. Pulmonary vascular congestion and enlarged cardiac silhouette, could reflect cardiomegaly or pericardial effusion given lobular cardiac contours. 2. Low volumes and atelectasis. Electronically Signed   By: Lovena Le M.D.   On: 01/12/2021 01:57   CT Chest Wo Contrast  Result Date: 01/12/2021 CLINICAL DATA:  60 year old female with increasing shortness of breath for 2 weeks. Lower extremity edema. EXAM: CT CHEST WITHOUT CONTRAST TECHNIQUE: Multidetector CT imaging of the chest was performed following the standard protocol without IV contrast. COMPARISON:  Chest radiographs earlier today, 02/09/2020. CT Abdomen and Pelvis 11/09/2008. FINDINGS: Cardiovascular: Moderate cardiomegaly is new since 2009. Small volume pericardial effusion also present, but most pronounced at the cardiac base, proximal great vessels. Calcified aortic atherosclerosis. Calcified coronary artery atherosclerosis on series 2, image 62. Vascular patency is not evaluated in the absence of IV contrast. Mediastinum/Nodes: No lymphadenopathy. A small gastric hiatal hernia is stable since 2009. Lungs/Pleura: Mild respiratory motion. Major airways are patent. Mild perihilar and retrocardiac atelectasis, primarily in the lingula and right middle lobe. No pleural effusion. No pulmonary edema, but there is a solitary small focus of nonspecific ground-glass opacity in the right mid lung (coronal image 78) near the minor  fissure. Upper Abdomen: Chronic hepatomegaly, but otherwise negative visible noncontrast liver. No splenomegaly. Incidental splenule. (Normal variant). Negative visible noncontrast adrenal glands, left kidney, and other bowel in the upper abdomen. Musculoskeletal: Motion artifact most affecting the ribs. No acute osseous abnormality identified. IMPRESSION: 1. Moderate cardiomegaly is new since 2009 and there is a small or trace pericardial effusion. Calcified coronary artery and Aortic Atherosclerosis (ICD10-I70.0). 2. Solitary small focus of ground-glass opacity in the right middle lobe such that mild or developing Bronchopneumonia is not excluded. But there is no generalized pulmonary edema, no pleural effusion. There is mild atelectasis. 3. Chronic hepatomegaly. Small gastric hiatal hernia. Electronically Signed   By: Genevie Ann M.D.   On: 01/12/2021 04:51    ____________________________________________   PROCEDURES  Procedure(s) performed: None  Procedures  Critical Care performed: No  ____________________________________________  INITIAL IMPRESSION / ASSESSMENT AND PLAN / ED COURSE  As part of my medical decision making, I reviewed the following data within the [place] notes reviewed and incorporated, Labs reviewed and show stable chronic kidney disease, Radiograph reviewed and shows pulmonary vascular congestion, CT shows developing right bronchopneumonia and Notes from prior ED visits     Patient here with progressively worsening shortness of breath for the past few weeks.  Her lungs are clear with good aeration but she looks short of breath just talking to me.  She does appear volume overloaded on exam and chest x-ray is suggestive of pulmonary vascular congestion.  BNP pending.  Will obtain CT of the chest without contrast to evaluate further.  Unable to obtain contrasted study due to patient's history of chronic kidney disease.  Her GFR is 24.  I do not  think this is an asthma exacerbation as her lungs are clear and she has good aeration at this time.  Also could be COVID-19.  We will swab today.  No sign of pneumonia on chest x-ray.  No sign of pneumothorax.  She denies any chest pain or chest discomfort.  Doubt ACS.  We will continue to closely monitor.  She does report she feels a little bit better after receiving a nebulizer treatment in triage.   5:20 AM  Pt's CT scan shows developing right middle lobe bronchopneumonia.  No pulmonary edema or pleural effusion.  New cardiomegaly since 2009 with trace pericardial effusion.  She has been able to ambulate without hypoxia but again appears very labored with her breathing.  Discussed findings and recommended admission.  Patient agrees to this plan.  Will discuss with hospitalist.  6:06 AM Discussed patient's case with hospitalist, Dr. Sidney Ace.  I have recommended admission and patient (and family if present) agree with this plan. Admitting physician will place admission orders.   I reviewed all nursing notes, vitals, pertinent previous records and reviewed/interpreted all EKGs, lab and urine results, imaging (as available).   ____________________________________________   FINAL CLINICAL IMPRESSION(S) / ED DIAGNOSES  Final diagnoses:  Shortness of breath  Community acquired pneumonia of right middle lobe of lung     ED Discharge Orders    None       Note:  This document was prepared using Dragon voice recognition software and may include unintentional dictation errors.    Hadyn Azer, Delice Bison, DO 01/12/21 [phone]

## 2021-01-12 NOTE — Progress Notes (Signed)
PHARMACIST - PHYSICIAN COMMUNICATION  CONCERNING:  Enoxaparin (Lovenox) for DVT Prophylaxis   DESCRIPTION: Patient was prescribed enoxaprin 40mg  q24 hours for VTE prophylaxis.   Filed Weights   01/11/21 2211  Weight: 90.4 kg (199 lb 4.7 oz)    Body mass index is 43.13 kg/m.  Estimated Creatinine Clearance: 24.5 mL/min (A) (by C-G formula based on SCr of 2.31 mg/dL (H)).   Patient is candidate for enoxaparin 30mg  every 24 hours based on CrCl <66ml/min or Weight <45kg  RECOMMENDATION: Pharmacy has adjusted enoxaparin dose per [facility] policy.  Patient is now receiving enoxaparin 30 mg every 24 hours    Darnelle Bos, PharmD Clinical Pharmacist  01/12/2021 7:46 AM

## 2021-01-12 NOTE — ED Notes (Addendum)
Pt resting comfortably at this time. Pt denies any needs at this time. NAD noted. Call bell in reach. Pt is A&Ox4 at this time.

## 2021-01-12 NOTE — ED Notes (Addendum)
Pt ambulated at bedside. O2 remains above 97% at this time although pt became short of breath with labored breathing and expiratory wheezing. Pt able to speak in full sentences. MD Ward aware. Pt transported to CT at this time.

## 2021-01-12 NOTE — ED Notes (Signed)
Pt resting comfortably at this time. NAD noted. Pt denies any further needs, diet sprite provided per request. Call bell in reach. 900 ml emptied from urine hate in bedside toilet.

## 2021-01-12 NOTE — ED Notes (Signed)
Pt placed in hospital gown per request.

## 2021-01-12 NOTE — H&P (Signed)
History and Physical    Brianna [name] ZES:[id number] DOB: [birth year] DOA: 01/12/2021  Referring MD/NP/PA:   PCP: Donnie Coffin, MD   Patient coming from:  The patient is coming from home.  At baseline, pt is independent for most of ADL.        Chief Complaint: SOB  HPI: Brianna [name] is a 60 y.o. female with medical history significant of hypertension, diabetes mellitus, asthma, GERD, gout, ventral hernia, CKD stage IV, obesity, who presents with shortness breath.  Patient states that she has been having shortness of breath for more than 4 weeks, which is worsening the past 2 weeks, and progressively worsening.  Patient has dry cough, no chest pain, fever or chills. Pt has severe bilateral lower leg edema.  No history of CHF, DVT or PE. She is taking Lasix 40 mg prn. She has been vaccinated for COVID-19. States she has been using albuterol nebulizer treatments at home with minimal relief.   ED Course: pt was found to have WBC 5.9, BMP 16.8, pending COVID-19 PCR, potassium 3.3, renal function close to baseline, temperature normal, blood pressure 156/96, heart rate 88, RR 25, oxygen saturation 97% on room air.  Chest x-ray showed vascular congestion and cardiomegaly.  CT of the chest showed moderate cardiomegaly, small focus of infiltration in right middle lobe. Pt is admitted by accepting MD to med-surg bed as inpt.  Review of Systems:   General: no fevers, chills, no body weight gain, has fatigue HEENT: no blurry vision, hearing changes or sore throat Respiratory: has dyspnea, coughing, no wheezing CV: no chest pain, no palpitations GI: no nausea, vomiting, abdominal pain, diarrhea, constipation GU: no dysuria, burning on urination, increased urinary frequency, hematuria  Ext: has leg edema Neuro: no unilateral weakness, numbness, or tingling, no vision change or hearing loss Skin: no rash, no skin tear. MSK: No muscle spasm, no deformity, no limitation of range of  movement in spin Heme: No easy bruising.  Travel history: No recent long distant travel.  Allergy:  Allergies  Allergen Reactions  . Ace Inhibitors Anaphylaxis  . Lisinopril Anaphylaxis  . Lipitor [Atorvastatin] Other (See Comments)    Muscle aches    Past Medical History:  Diagnosis Date  . Acid reflux   . Asthma   . Diabetes mellitus without complication ([place])   . Hypertension     Past Surgical History:  Procedure Laterality Date  . BOWEL RESECTION  02/09/2020   Procedure: SMALL BOWEL RESECTION;  Surgeon: Olean Ree, MD;  Location: [facility] ORS;  Service: General;;  . LAPAROTOMY N/A 02/09/2020   Procedure: EXPLORATORY LAPAROTOMY;  Surgeon: Olean Ree, MD;  Location: [facility] ORS;  Service: General;  Laterality: N/A;    Social History:  reports that she has never smoked. She has never used smokeless tobacco. She reports that she does not drink alcohol and does not use drugs.  Family History:  Family History  Problem Relation Age of Onset  . Breast cancer Neg Hx      Prior to Admission medications   Medication Sig Start Date End Date Taking? Authorizing Provider  acetaminophen (TYLENOL) 500 MG tablet Take 2 tablets (1,000 mg total) by mouth every 6 (six) hours as needed for mild pain. 02/14/20 02/13/21  Olean Ree, MD  albuterol (PROVENTIL) (2.5 MG/3ML) 0.083% nebulizer solution Take 2.5 mg by nebulization every 4 (four) hours as needed for shortness of breath or wheezing. 12/21/19   [provider]  allopurinol (ZYLOPRIM) 300 MG tablet Take  300 mg by mouth daily. 12/21/19   [provider]  amLODipine (NORVASC) 10 MG tablet Take 10 mg by mouth daily. 12/21/19   [provider]  ASPIRIN ADULT LOW STRENGTH 81 MG EC tablet Take 81 mg by mouth daily. 12/21/19   [provider]  carvedilol (COREG) 12.5 MG tablet Take 1 tablet (12.5 mg total) by mouth 2 (two) times daily. 02/14/20   [name], MD  cetirizine (ZYRTEC) 10 MG tablet Take 10 mg by  mouth daily. 09/18/19   [provider]  furosemide (LASIX) 40 MG tablet Take 1 tablet (40 mg total) by mouth daily as needed. For leg swelling or weight gain >5lbs 02/14/20   [name], MD  HUMULIN N 100 UNIT/ML injection Inject 0.2 mLs (20 Units total) into the skin 2 (two) times daily. 02/14/20   [name], MD  hydrochlorothiazide (HYDRODIURIL) 25 MG tablet TAKE 1 Tablet BY MOUTH ONCE EVERY DAY 12/21/19   [provider]  JANUVIA 100 MG tablet Take 100 mg by mouth daily. 12/21/19   [provider]  omeprazole (PRILOSEC) 20 MG capsule Take 20 mg by mouth daily. 12/21/19   [provider]  oxyCODONE (OXY IR/ROXICODONE) 5 MG immediate release tablet Take 1 tablet (5 mg total) by mouth every 4 (four) hours as needed for moderate pain or severe pain. Patient not taking: Reported on 03/11/2020 02/14/20   Olean Ree, MD  PROVENTIL HFA 108 [phone] Base) MCG/ACT inhaler Inhale 2 puffs into the lungs 4 (four) times daily as needed for shortness of breath or wheezing. 12/21/19   [provider]    Physical Exam: Vitals:   01/12/21 0400 01/12/21 0430 01/12/21 0549 01/12/21 0700  BP: (!) 160/94  (!) 156/96 (!) 161/89  Pulse: 65 80 63 75  Resp: 14 19 20 20   Temp:      TempSrc:      SpO2: 99% 97% 97% 100%  Weight:      Height:       General: Not in acute distress.  Patient has anasarca HEENT:       Eyes: PERRL, EOMI, no scleral icterus.       ENT: No discharge from the ears and nose, no pharynx injection, no tonsillar enlargement.        Neck: No JVD, no bruit, no mass felt. Heme: No neck lymph node enlargement. Cardiac: S1/S2, RRR, No murmurs, No gallops or rubs. Respiratory: has decreased air movement bilaterally GI: Soft, nondistended, nontender, no rebound pain, no organomegaly, BS present. GU: No hematuria Ext: 3+ pitting leg edema bilaterally. 1+DP/PT pulse bilaterally. Musculoskeletal: No joint deformities, No joint redness or warmth, no  limitation of ROM in spin. Skin: No rashes.  Neuro: Alert, oriented X3, cranial nerves II-XII grossly intact, moves all extremities normally.  Psych: Patient is not psychotic, no suicidal or hemocidal ideation.  Labs on Admission: I have personally reviewed following labs and imaging studies  CBC: Recent Labs  Lab 01/11/21 2156  WBC 5.9  HGB 10.1*  HCT 30.8*  MCV 90.1  PLT 831   Basic Metabolic Panel: Recent Labs  Lab 01/11/21 2156  NA 140  K 3.3*  CL 107  CO2 22  GLUCOSE 131*  BUN 36*  CREATININE 2.31*  CALCIUM 9.3   GFR: Estimated Creatinine Clearance: 24.5 mL/min (A) (by C-G formula based on SCr of 2.31 mg/dL (H)). Liver Function Tests: No results for input(s): AST, ALT, ALKPHOS, BILITOT, PROT, ALBUMIN in the last 168 hours. No results  for input(s): LIPASE, AMYLASE in the last 168 hours. No results for input(s): AMMONIA in the last 168 hours. Coagulation Profile: No results for input(s): INR, PROTIME in the last 168 hours. Cardiac Enzymes: No results for input(s): CKTOTAL, CKMB, CKMBINDEX, TROPONINI in the last 168 hours. BNP (last 3 results) No results for input(s): PROBNP in the last 8760 hours. HbA1C: No results for input(s): HGBA1C in the last 72 hours. CBG: No results for input(s): GLUCAP in the last 168 hours. Lipid Profile: No results for input(s): CHOL, HDL, LDLCALC, TRIG, CHOLHDL, LDLDIRECT in the last 72 hours. Thyroid Function Tests: No results for input(s): TSH, T4TOTAL, FREET4, T3FREE, THYROIDAB in the last 72 hours. Anemia Panel: No results for input(s): VITAMINB12, FOLATE, FERRITIN, TIBC, IRON, RETICCTPCT in the last 72 hours. Urine analysis:    Component Value Date/Time   COLORURINE YELLOW (A) 02/13/2020 0020   APPEARANCEUR HAZY (A) 02/13/2020 0020   APPEARANCEUR Hazy 04/22/2013 1641   LABSPEC 1.014 02/13/2020 0020   LABSPEC 1.030 04/22/2013 1641   PHURINE 5.0 02/13/2020 0020   GLUCOSEU NEGATIVE 02/13/2020 0020   GLUCOSEU >=500  04/22/2013 1641   HGBUR MODERATE (A) 02/13/2020 0020   BILIRUBINUR NEGATIVE 02/13/2020 0020   BILIRUBINUR Negative 04/22/2013 1641   KETONESUR NEGATIVE 02/13/2020 0020   PROTEINUR 100 (A) 02/13/2020 0020   NITRITE NEGATIVE 02/13/2020 0020   LEUKOCYTESUR TRACE (A) 02/13/2020 0020   LEUKOCYTESUR Negative 04/22/2013 1641   Sepsis Labs: @LABRCNTIP (IDPOEUMPNTIRW:4,RXVQMGQQPYP:9) )No results found for this or any previous visit (from the past 240 hour(s)).   Radiological Exams on Admission: DG Chest 2 View  Result Date: 01/12/2021 CLINICAL DATA:  Worsening shortness of breath for 2 weeks EXAM: CHEST - 2 VIEW COMPARISON:  Radiograph 02/09/2020 FINDINGS: Mild pulmonary vascular congestion within enlarged, lobular cardiac silhouette. The aorta is calcified. The remaining cardiomediastinal contours are unremarkable. Low volumes and atelectatic changes are present with vascular crowding. More bandlike opacity in the left mid lung, likely atelectatic or scarring. No other focal consolidative process. No pneumothorax or visible effusion. No acute osseous or soft tissue abnormality. Degenerative changes are present in the imaged spine and shoulders. IMPRESSION: 1. Pulmonary vascular congestion and enlarged cardiac silhouette, could reflect cardiomegaly or pericardial effusion given lobular cardiac contours. 2. Low volumes and atelectasis. Electronically Signed   By: Lovena Le M.D.   On: 01/12/2021 01:57   CT Chest Wo Contrast  Result Date: 01/12/2021 CLINICAL DATA:  60 year old female with increasing shortness of breath for 2 weeks. Lower extremity edema. EXAM: CT CHEST WITHOUT CONTRAST TECHNIQUE: Multidetector CT imaging of the chest was performed following the standard protocol without IV contrast. COMPARISON:  Chest radiographs earlier today, 02/09/2020. CT Abdomen and Pelvis 11/09/2008. FINDINGS: Cardiovascular: Moderate cardiomegaly is new since 2009. Small volume pericardial effusion also present,  but most pronounced at the cardiac base, proximal great vessels. Calcified aortic atherosclerosis. Calcified coronary artery atherosclerosis on series 2, image 62. Vascular patency is not evaluated in the absence of IV contrast. Mediastinum/Nodes: No lymphadenopathy. A small gastric hiatal hernia is stable since 2009. Lungs/Pleura: Mild respiratory motion. Major airways are patent. Mild perihilar and retrocardiac atelectasis, primarily in the lingula and right middle lobe. No pleural effusion. No pulmonary edema, but there is a solitary small focus of nonspecific ground-glass opacity in the right mid lung (coronal image 78) near the minor fissure. Upper Abdomen: Chronic hepatomegaly, but otherwise negative visible noncontrast liver. No splenomegaly. Incidental splenule. (Normal variant). Negative visible noncontrast adrenal glands, left kidney, and other bowel in the  upper abdomen. Musculoskeletal: Motion artifact most affecting the ribs. No acute osseous abnormality identified. IMPRESSION: 1. Moderate cardiomegaly is new since 2009 and there is a small or trace pericardial effusion. Calcified coronary artery and Aortic Atherosclerosis (ICD10-I70.0). 2. Solitary small focus of ground-glass opacity in the right middle lobe such that mild or developing Bronchopneumonia is not excluded. But there is no generalized pulmonary edema, no pleural effusion. There is mild atelectasis. 3. Chronic hepatomegaly. Small gastric hiatal hernia. Electronically Signed   By: Genevie Ann M.D.   On: 01/12/2021 04:51     EKG: I have personally reviewed.  Sinus rhythm, QTC 372, LAD, low voltage, poor R wave progression   Assessment/Plan Principal Problem:   Bronchopneumonia Active Problems:   Hypertension   Type II diabetes mellitus with renal manifestations (HCC)   Acid reflux   Asthma   CKD (chronic kidney disease), stage IV (HCC)   Hypokalemia   Possible bronchopneumonia: Patient has been having shortness of breath for  more than 4 weeks, which has been worsening in the past 2 weeks.  No fever or leukocytosis.  CT of the chest showed small focus of infiltrate in the right middle lobe indicating possible bronchopneumonia.  No oxygen desaturation.  Clinically not septic.  Patient has history of CKD stage IV. She has 3+leg edema and anasarca, and chest x-ray showed vascular congestion and cardiomegaly.  No 2D echo on record. BNP 16.8. Pt may also have mild pulmonary edema, which may have contributed partially.  Will get 2D echo for further evaluation.  Will also consult renal for CKD-IV.  - pt is  admit to med-surg bed as inpt by accepting MD - IV Rocephin and azithromycin - Mucinex for cough  - Bronchodilators - Urine legionella and S. pneumococcal antigen - Follow up blood culture x2, sputum culture - f/u 2d echo -will give one dose of IV lasix 60 mg now  Hypertension: -IV hydralazine as needed -Amlodipine, Coreg -change prn Lasix to daily 40 mg and hold HCTZ  Type II diabetes mellitus with renal manifestations [facility]): Recent A1c 7.2, poorly controlled.  Patient is taking Humulin at home -Sliding scale insulin  Acid reflux -Protonix  Asthma: -Bronchodilators as above  CKD (chronic kidney disease), stage IV ([place]): Baseline creatinine 2.2-2.8 recently.  Her creatinine is at 2.09, BUN 36, close to baseline. -Dr. Candiss Norse of renal was consulted  Hypokalemia: K= 3.3 on admission. - Repleted k - Check Mg level      DVT ppx: SQ Lovenox Code Status: Full code Family Communication: not done, no family member is at bed side.    Disposition Plan:  Anticipate discharge back to previous environment Consults called:  Dr. Candiss Norse of renal Admission status: Med-surg bed as inpt      Status is: Inpatient  Remains inpatient appropriate because:Inpatient level of care appropriate due to severity of illness.  Patient has multiple comorbidities, including CKD stage IV, now presents with possible bronchopneumonia.   Patient also has 3+ bilateral leg edema and anasarca, and possible pulmonary edema.  Will need further evaluation and treatment.  Her presentation is highly complicated.  Patient is at high risk of deteriorating.  Need to be treated in the hospital for at least 2 days.   Dispo: The patient is from: Home              Anticipated d/c is to: Home              Anticipated d/c date is: 2 days  Patient currently is not medically stable to d/c.          Date of Service 01/12/2021    Ivor Costa [facility]   If 7PM-7AM, please contact night-coverage [email or web address] 01/12/2021, 7:31 AM

## 2021-01-13 ENCOUNTER — Inpatient Hospital Stay: Payer: BC Managed Care – PPO

## 2021-01-13 DIAGNOSIS — J189 Pneumonia, unspecified organism: Secondary | ICD-10-CM

## 2021-01-13 LAB — BASIC METABOLIC PANEL
Anion gap: 9 (ref 5–15)
BUN: 37 mg/dL — ABNORMAL HIGH (ref 6–20)
CO2: 23 mmol/L (ref 22–32)
Calcium: 9.4 mg/dL (ref 8.9–10.3)
Chloride: 108 mmol/L (ref 98–111)
Creatinine, Ser: 2.28 mg/dL — ABNORMAL HIGH (ref 0.44–1.00)
GFR, Estimated: 24 mL/min — ABNORMAL LOW (ref >60)
Glucose, Bld: 172 mg/dL — ABNORMAL HIGH (ref 70–99)
Potassium: 3.5 mmol/L (ref 3.5–5.1)
Sodium: 140 mmol/L (ref 135–145)

## 2021-01-13 LAB — CBC
HCT: 32.9 % — ABNORMAL LOW (ref 36.0–46.0)
Hemoglobin: 10.3 g/dL — ABNORMAL LOW (ref 12.0–15.0)
MCH: 28.7 pg (ref 26.0–34.0)
MCHC: 31.3 g/dL (ref 30.0–36.0)
MCV: 91.6 fL (ref 80.0–100.0)
Platelets: 341 10*3/uL (ref 150–400)
RBC: 3.59 MIL/uL — ABNORMAL LOW (ref 3.87–5.11)
RDW: 15.2 % (ref 11.5–15.5)
WBC: 6.6 10*3/uL (ref 4.0–10.5)
nRBC: 0 % (ref 0.0–0.2)

## 2021-01-13 LAB — GLUCOSE, CAPILLARY
Glucose-Capillary: 216 mg/dL — ABNORMAL HIGH (ref 70–99)
Glucose-Capillary: 129 mg/dL — ABNORMAL HIGH (ref 70–99)
Glucose-Capillary: 174 mg/dL — ABNORMAL HIGH (ref 70–99)
Glucose-Capillary: 157 mg/dL — ABNORMAL HIGH (ref 70–99)
Glucose-Capillary: 183 mg/dL — ABNORMAL HIGH (ref 70–99)

## 2021-01-13 LAB — STREP PNEUMONIAE URINARY ANTIGEN: Strep Pneumo Urinary Antigen: NEGATIVE

## 2021-01-13 LAB — HIV ANTIBODY (ROUTINE TESTING W REFLEX): HIV Screen 4th Generation wRfx: NONREACTIVE

## 2021-01-13 MED ORDER — FUROSEMIDE 10 MG/ML IJ SOLN
40.0000 mg | Freq: Two times a day (BID) | INTRAMUSCULAR | Status: DC
  Administered 2021-01-13 – 2021-01-16 (×6): 40 mg via INTRAVENOUS
  Filled 2021-01-13 (×6): qty 4

## 2021-01-13 MED ORDER — INFLUENZA VAC SPLIT QUAD 0.5 ML IM SUSY
0.5000 mL | PREFILLED_SYRINGE | INTRAMUSCULAR | Status: AC
  Administered 2021-01-14: 0.5 mL via INTRAMUSCULAR
  Filled 2021-01-13: qty 0.5

## 2021-01-13 NOTE — Progress Notes (Signed)
PROGRESS NOTE    Brianna [name]  INO:[id number] DOB: Aug 29, 1961 DOA: 01/12/2021 PCP: Donnie Coffin, MD    Brief Narrative:  60 y.o. female with medical history significant of hypertension, diabetes mellitus, asthma, GERD, gout, ventral hernia, CKD stage IV, obesity, who presents with shortness breath.  Patient states that she has been having shortness of breath for more than 4 weeks, which is worsening the past 2 weeks, and progressively worsening.  Patient has dry cough, no chest pain, fever or chills. Pt has severe bilateral lower leg edema.  No history of CHF, DVT or PE. She is taking Lasix 40 mg prn. She has been vaccinated for COVID-19.States she has been using albuterol nebulizer treatments at home with minimal relief.   ED Course: pt was found to have WBC 5.9, BMP 16.8, pending COVID-19 PCR, potassium 3.3, renal function close to baseline, temperature normal, blood pressure 156/96, heart rate 88, RR 25, oxygen saturation 97% on room air.  Chest x-ray showed vascular congestion and cardiomegaly.  CT of the chest showed moderate cardiomegaly, small focus of infiltration in right middle lobe  Assessment & Plan:   Principal Problem:   Bronchopneumonia Active Problems:   Hypertension   Type II diabetes mellitus with renal manifestations (HCC)   Acid reflux   Asthma   CKD (chronic kidney disease), stage IV (HCC)   Hypokalemia   Possible bronchopneumonia ruled out:  -Patient has been having shortness of breath for more than 4 weeks, which has been worsening in the past 2 weeks.  No fever or leukocytosis.   -CT of the chest showed small focus of infiltrate in the right middle lobe indicating possible bronchopneumonia.   -Clinically not septic.   -Presented grossly volume overloaded with notable improvement after one dose of IV lasix at time of presentation, thus suspect sob related to volume overload and not PNA -Have discontinued abx -Have increased lasix to 40mg  IV  BID -Nephrology following per below -Blood cx pending -2d echo reviewed. Findings of EF 70-75%  Hypertension: -IV hydralazine as needed -Amlodipine, Coreg -Now on IV lasix  Type II diabetes mellitus with renal manifestations [facility]): Recent A1c 7.2, poorly controlled.  Patient is taking Humulin at home -Sliding scale insulin as needed  Acid reflux -Continue protonix  Asthma: -Bronchodilators as above  CKD (chronic kidney disease), stage IV ([place]): Baseline creatinine 2.2-2.8 recently.  Her creatinine is at 2.09, BUN 36, close to baseline. -Tolerating lasix -Nephrology consulted. Of note, pt was referred to oupt Nephrology with first appointment originally scheduled for next week  Hypokalemia: K= 3.3 on admission. - Repleted k   DVT prophylaxis: Lovenox subq Code Status: Full Family Communication: Pt in room, family not at bedside  Status is: Inpatient  Remains inpatient appropriate because:IV treatments appropriate due to intensity of illness or inability to take PO and Inpatient level of care appropriate due to severity of illness   Dispo: The patient is from: Home              Anticipated d/c is to: Home              Anticipated d/c date is: 3 days              Patient currently is not medically stable to d/c.  Consultants:   Nephrology  Procedures:     Antimicrobials: Anti-infectives (From admission, onward)   Start     Dose/Rate Route Frequency Ordered Stop   01/13/21 0630  azithromycin (ZITHROMAX) 500 mg in  sodium chloride 0.9 % 250 mL IVPB  Status:  Discontinued        500 mg 250 mL/hr over 60 Minutes Intravenous Every 24 hours 01/12/21 0715 01/13/21 1055   01/13/21 0600  cefTRIAXone (ROCEPHIN) 1 g in sodium chloride 0.9 % 100 mL IVPB  Status:  Discontinued        1 g 200 mL/hr over 30 Minutes Intravenous Every 24 hours 01/12/21 0715 01/13/21 1055   01/12/21 0530  cefTRIAXone (ROCEPHIN) 1 g in sodium chloride 0.9 % 100 mL IVPB        1 g 200 mL/hr  over 30 Minutes Intravenous  Once 01/12/21 0516 01/12/21 0627   01/12/21 0530  azithromycin (ZITHROMAX) 500 mg in sodium chloride 0.9 % 250 mL IVPB        500 mg 250 mL/hr over 60 Minutes Intravenous  Once 01/12/21 0516 01/12/21 0921       Subjective: Feeling better. Asking about going home, but at the same time acknowledges continued generalized swelling  Objective: Vitals:   01/12/21 2312 01/13/21 0516 01/13/21 0952 01/13/21 1115  BP: (!) 153/66 (!) 157/81 (!) 156/77 (!) 173/93  Pulse: 70 66 75 76  Resp: 19 (!) 23 (!) 22 (!) 21  Temp: (!) 97.5 F (36.4 C) 98.3 F (36.8 C) 98.7 F (37.1 C) 98.2 F (36.8 C)  TempSrc: Oral Oral Oral Oral  SpO2: 100% 100% 100% 100%  Weight:      Height:        Intake/Output Summary (Last 24 hours) at 01/13/2021 1555 Last data filed at 01/13/2021 1405 Gross per 24 hour  Intake 120 ml  Output 600 ml  Net -480 ml   Filed Weights   01/11/21 2211  Weight: 90.4 kg    Examination:  General exam: Appears calm and comfortable  Respiratory system: Clear to auscultation. Respiratory effort normal. Cardiovascular system: S1 & S2 heard, Regular Gastrointestinal system: Abdomen is nondistended, soft and nontender. No organomegaly or masses felt. Normal bowel sounds heard. Central nervous system: Alert and oriented. No focal neurological deficits. Extremities: Symmetric 5 x 5 power, pitting edema throughout Skin: No rashes, lesions Psychiatry: Judgement and insight appear normal. Mood & affect appropriate.   Data Reviewed: I have personally reviewed following labs and imaging studies  CBC: Recent Labs  Lab 01/11/21 2156 01/13/21 0310  WBC 5.9 6.6  HGB 10.1* 10.3*  HCT 30.8* 32.9*  MCV 90.1 91.6  PLT 295 299   Basic Metabolic Panel: Recent Labs  Lab 01/11/21 2156 01/13/21 0310  NA 140 140  K 3.3* 3.5  CL 107 108  CO2 22 23  GLUCOSE 131* 172*  BUN 36* 37*  CREATININE 2.31* 2.28*  CALCIUM 9.3 9.4  MG 1.4*  --     GFR: Estimated Creatinine Clearance: 24.9 mL/min (A) (by C-G formula based on SCr of 2.28 mg/dL (H)). Liver Function Tests: No results for input(s): AST, ALT, ALKPHOS, BILITOT, PROT, ALBUMIN in the last 168 hours. No results for input(s): LIPASE, AMYLASE in the last 168 hours. No results for input(s): AMMONIA in the last 168 hours. Coagulation Profile: No results for input(s): INR, PROTIME in the last 168 hours. Cardiac Enzymes: No results for input(s): CKTOTAL, CKMB, CKMBINDEX, TROPONINI in the last 168 hours. BNP (last 3 results) No results for input(s): PROBNP in the last 8760 hours. HbA1C: No results for input(s): HGBA1C in the last 72 hours. CBG: Recent Labs  Lab 01/12/21 1141 01/12/21 1644 01/12/21 2115 01/13/21 0817 01/13/21 1112  GLUCAP 150* 178* 216* 129* 174*   Lipid Profile: No results for input(s): CHOL, HDL, LDLCALC, TRIG, CHOLHDL, LDLDIRECT in the last 72 hours. Thyroid Function Tests: No results for input(s): TSH, T4TOTAL, FREET4, T3FREE, THYROIDAB in the last 72 hours. Anemia Panel: No results for input(s): VITAMINB12, FOLATE, FERRITIN, TIBC, IRON, RETICCTPCT in the last 72 hours. Sepsis Labs: No results for input(s): PROCALCITON, LATICACIDVEN in the last 168 hours.  Recent Results (from the past 240 hour(s))  SARS CORONAVIRUS 2 (TAT 6-24 HRS) Nasopharyngeal Nasopharyngeal Swab     Status: None   Collection Time: 01/12/21  3:50 AM   Specimen: Nasopharyngeal Swab  Result Value Ref Range Status   SARS Coronavirus 2 NEGATIVE NEGATIVE Final    Comment: (NOTE) SARS-CoV-2 target nucleic acids are NOT DETECTED.  The SARS-CoV-2 RNA is generally detectable in upper and lower respiratory specimens during the acute phase of infection. Negative results do not preclude SARS-CoV-2 infection, do not rule out co-infections with other pathogens, and should not be used as the sole basis for treatment or other patient management decisions. Negative results must be  combined with clinical observations, patient history, and epidemiological information. The expected result is Negative.  Fact Sheet for Patients: SugarRoll.be  Fact Sheet for Healthcare Providers: https://www.woods-mathews.com/  This test is not yet approved or cleared by the [place] FDA and  has been authorized for detection and/or diagnosis of SARS-CoV-2 by FDA under an Emergency Use Authorization (EUA). This EUA will remain  in effect (meaning this test can be used) for the duration of the COVID-19 declaration under Se ction 564(b)(1) of the Act, 21 U.S.C. section 360bbb-3(b)(1), unless the authorization is terminated or revoked sooner.  Performed at [facility], [place] [address], [place], [place] [address]      Radiology Studies: DG Chest 2 View  Result Date: 01/12/2021 CLINICAL DATA:  Worsening shortness of breath for 2 weeks EXAM: CHEST - 2 VIEW COMPARISON:  Radiograph 02/09/2020 FINDINGS: Mild pulmonary vascular congestion within enlarged, lobular cardiac silhouette. The aorta is calcified. The remaining cardiomediastinal contours are unremarkable. Low volumes and atelectatic changes are present with vascular crowding. More bandlike opacity in the left mid lung, likely atelectatic or scarring. No other focal consolidative process. No pneumothorax or visible effusion. No acute osseous or soft tissue abnormality. Degenerative changes are present in the imaged spine and shoulders. IMPRESSION: 1. Pulmonary vascular congestion and enlarged cardiac silhouette, could reflect cardiomegaly or pericardial effusion given lobular cardiac contours. 2. Low volumes and atelectasis. Electronically Signed   By: Lovena Le M.D.   On: 01/12/2021 01:57   CT Chest Wo Contrast  Result Date: 01/12/2021 CLINICAL DATA:  60 year old female with increasing shortness of breath for 2 weeks. Lower extremity edema. EXAM: CT CHEST WITHOUT CONTRAST TECHNIQUE:  Multidetector CT imaging of the chest was performed following the standard protocol without IV contrast. COMPARISON:  Chest radiographs earlier today, 02/09/2020. CT Abdomen and Pelvis 11/09/2008. FINDINGS: Cardiovascular: Moderate cardiomegaly is new since 2009. Small volume pericardial effusion also present, but most pronounced at the cardiac base, proximal great vessels. Calcified aortic atherosclerosis. Calcified coronary artery atherosclerosis on series 2, image 62. Vascular patency is not evaluated in the absence of IV contrast. Mediastinum/Nodes: No lymphadenopathy. A small gastric hiatal hernia is stable since 2009. Lungs/Pleura: Mild respiratory motion. Major airways are patent. Mild perihilar and retrocardiac atelectasis, primarily in the lingula and right middle lobe. No pleural effusion. No pulmonary edema, but there is a solitary small focus of nonspecific ground-glass  opacity in the right mid lung (coronal image 78) near the minor fissure. Upper Abdomen: Chronic hepatomegaly, but otherwise negative visible noncontrast liver. No splenomegaly. Incidental splenule. (Normal variant). Negative visible noncontrast adrenal glands, left kidney, and other bowel in the upper abdomen. Musculoskeletal: Motion artifact most affecting the ribs. No acute osseous abnormality identified. IMPRESSION: 1. Moderate cardiomegaly is new since 2009 and there is a small or trace pericardial effusion. Calcified coronary artery and Aortic Atherosclerosis (ICD10-I70.0). 2. Solitary small focus of ground-glass opacity in the right middle lobe such that mild or developing Bronchopneumonia is not excluded. But there is no generalized pulmonary edema, no pleural effusion. There is mild atelectasis. 3. Chronic hepatomegaly. Small gastric hiatal hernia. Electronically Signed   By: Genevie Ann M.D.   On: 01/12/2021 04:51   ECHOCARDIOGRAM COMPLETE  Result Date: 01/12/2021    ECHOCARDIOGRAM REPORT   Patient Name:   Brianna [name]  Date of Exam: 01/12/2021 Medical Rec #:  [id number]           Height:       57.0 in Accession #:    [id number]          Weight:       199.3 lb Date of Birth:  14-Oct-1961           BSA:          1.796 m Patient Age:    45 years            BP:           164/60 mmHg Patient Gender: F                   HR:           84 bpm. Exam Location:  [facility] Procedure: 2D Echo, Cardiac Doppler and Color Doppler Indications:     CHF-acute diastolic W29.56  History:         Patient has no prior history of Echocardiogram examinations.                  Risk Factors:Hypertension and Diabetes.  Sonographer:     Sherrie Sport RDCS (AE) Referring Phys:  [id number] Soledad Gerlach NIU Diagnosing Phys: Bartholome Bill MD IMPRESSIONS  1. Left ventricular ejection fraction, by estimation, is 70 to 75%. The left ventricle has hyperdynamic function. The left ventricle has no regional wall motion abnormalities. There is mild left ventricular hypertrophy. Left ventricular diastolic parameters are consistent with Grade I diastolic dysfunction (impaired relaxation).  2. Right ventricular systolic function is normal. The right ventricular size is normal.  3. The mitral valve is grossly normal. Trivial mitral valve regurgitation.  4. The aortic valve is grossly normal. Aortic valve regurgitation is trivial. No aortic stenosis is present. FINDINGS  Left Ventricle: Left ventricular ejection fraction, by estimation, is 70 to 75%. The left ventricle has hyperdynamic function. The left ventricle has no regional wall motion abnormalities. The left ventricular internal cavity size was normal in size. There is mild left ventricular hypertrophy. Left ventricular diastolic parameters are consistent with Grade I diastolic dysfunction (impaired relaxation). Right Ventricle: The right ventricular size is normal. No increase in right ventricular wall thickness. Right ventricular systolic function is normal. Left Atrium: Left atrial size was normal in size. Right Atrium: Right atrial size  was normal in size. Pericardium: There is no evidence of pericardial effusion. Mitral Valve: The mitral valve is grossly normal. Trivial mitral valve regurgitation. Tricuspid Valve: The tricuspid valve is not well visualized.  Tricuspid valve regurgitation is trivial. Aortic Valve: The aortic valve is grossly normal. Aortic valve regurgitation is trivial. No aortic stenosis is present. Aortic valve mean gradient measures 4.0 mmHg. Aortic valve peak gradient measures 6.4 mmHg. Aortic valve area, by VTI measures 2.15 cm. Pulmonic Valve: The pulmonic valve was not assessed. Pulmonic valve regurgitation is not visualized. Aorta: The aortic root is normal in size and structure. IAS/Shunts: The interatrial septum was not well visualized.  LEFT VENTRICLE PLAX 2D LVIDd:         5.00 cm  Diastology LVIDs:         3.01 cm  LV e' medial:    5.55 cm/s LV PW:         1.29 cm  LV E/e' medial:  12.0 LV IVS:        1.04 cm  LV e' lateral:   5.22 cm/s LVOT diam:     2.00 cm  LV E/e' lateral: 12.8 LV SV:         52 LV SV Index:   29 LVOT Area:     3.14 cm  RIGHT VENTRICLE RV Basal diam:  3.68 cm RV S prime:     13.90 cm/s TAPSE (M-mode): 4.5 cm LEFT ATRIUM             Index       RIGHT ATRIUM           Index LA diam:        3.10 cm 1.73 cm/m  RA Area:     15.80 cm LA Vol (A2C):   53.2 ml 29.62 ml/m RA Volume:   39.00 ml  21.72 ml/m LA Vol (A4C):   63.7 ml 35.47 ml/m LA Biplane Vol: 64.5 ml 35.91 ml/m  AORTIC VALVE                   PULMONIC VALVE AV Area (Vmax):    2.20 cm    PV Vmax:        1.00 m/s AV Area (Vmean):   2.03 cm    PV Peak grad:   4.0 mmHg AV Area (VTI):     2.15 cm    RVOT Peak grad: 7 mmHg AV Vmax:           126.00 cm/s AV Vmean:          92.500 cm/s AV VTI:            0.244 m AV Peak Grad:      6.4 mmHg AV Mean Grad:      4.0 mmHg LVOT Vmax:         88.10 cm/s LVOT Vmean:        59.900 cm/s LVOT VTI:          0.167 m LVOT/AV VTI ratio: 0.68  AORTA Ao Root diam: 3.30 cm MITRAL VALVE               TRICUSPID  VALVE MV Area (PHT): 3.56 cm    TR Peak grad:   11.3 mmHg MV Decel Time: 213 msec    TR Vmax:        168.00 cm/s MV E velocity: 66.80 cm/s MV A velocity: 76.30 cm/s  SHUNTS MV E/A ratio:  0.88        Systemic VTI:  0.17 m                            Systemic  Diam: 2.00 cm Bartholome Bill MD Electronically signed by Bartholome Bill MD Signature Date/Time: 01/12/2021/4:38:08 PM    Final     Scheduled Meds: . allopurinol  300 mg Oral Daily  . amLODipine  10 mg Oral Daily  . aspirin EC  81 mg Oral Daily  . carvedilol  6.25 mg Oral BID  . enoxaparin (LOVENOX) injection  30 mg Subcutaneous Q24H  . furosemide  40 mg Intravenous BID  . [START ON 01/14/2021] influenza vac split quadrivalent PF  0.5 mL Intramuscular Tomorrow-1000  . insulin aspart  0-5 Units Subcutaneous QHS  . insulin aspart  0-9 Units Subcutaneous TID WC  . Ipratropium-Albuterol  2 puff Inhalation Q4H  . pantoprazole  40 mg Oral Daily   Continuous Infusions:   LOS: 1 day   Marylu Lund, MD [facility] Pager On Amion  If 7PM-7AM, please contact night-coverage 01/13/2021, 3:55 PM

## 2021-01-13 NOTE — Consult Note (Signed)
[address] [place], [place] [address] Phone [phone]. Fax [phone]  Date: 01/13/2021                  Patient Name:  Brianna [name]  MRN: [id number]  DOB: 04-Apr-1961  Age / Sex: 60 y.o., female         PCP: Donnie Coffin, MD                 Service Requesting Consult: IM/ Donne Hazel, MD                 Reason for Consult: ARF            History of Present Illness: Patient is a 60 y.o. female  admitted to [facility] on 01/12/2021 .  Patient presents for follow-up for left knee 4 mg followed by noted to have increased work of breathing injury history of asthma.  Diagnosed with a right middle lobe bronchopneumonia.  Admitted for further evaluation and management.  Patient has renal insufficiency and lower extremity edema.  Nephrology consult has now been requested for evaluation as well as to provide assistance with diuresis   Medications: Outpatient medications: Medications Prior to Admission  Medication Sig Dispense Refill Last Dose  . acetaminophen (TYLENOL) 500 MG tablet Take 2 tablets (1,000 mg total) by mouth every 6 (six) hours as needed for mild pain. 100 tablet 2 01/11/2021 at 2000  . albuterol (PROVENTIL) (2.5 MG/3ML) 0.083% nebulizer solution Take 2.5 mg by nebulization every 4 (four) hours as needed for shortness of breath or wheezing.   01/11/2021 at 2000  . allopurinol (ZYLOPRIM) 300 MG tablet Take 300 mg by mouth daily.   01/11/2021 at 0700  . amLODipine (NORVASC) 10 MG tablet Take 10 mg by mouth daily.   01/11/2021 at 0700  . ASPIRIN ADULT LOW STRENGTH 81 MG EC tablet Take 81 mg by mouth daily.   01/11/2021 at 0700  . carvedilol (COREG) 6.25 MG tablet Take 6.25 mg by mouth 2 (two) times daily.   01/11/2021 at 2000  . cetirizine (ZYRTEC) 10 MG tablet Take 10 mg by mouth daily.   01/11/2021 at 0700  . furosemide (LASIX) 40 MG tablet Take 1 tablet (40 mg total) by mouth daily as needed. For leg swelling or weight gain >5lbs 20 tablet 0 01/11/2021 at  0700  . HUMULIN N 100 UNIT/ML injection Inject 0.2 mLs (20 Units total) into the skin 2 (two) times daily. 10 mL 11 01/11/2021 at 2000  . hydrochlorothiazide (HYDRODIURIL) 25 MG tablet Take 25 mg by mouth daily.   01/11/2021 at 0700  . omeprazole (PRILOSEC) 20 MG capsule Take 20 mg by mouth daily.   01/11/2021 at 0700  . CYMBALTA 60 MG capsule Take 60 mg by mouth daily. (Patient not taking: Reported on 01/12/2021)   Not Taking at Unknown time    Current medications: Current Facility-Administered Medications  Medication Dose Route Frequency Provider Last Rate Last Admin  . acetaminophen (TYLENOL) tablet 650 mg  650 mg Oral Q6H PRN Ivor Costa, MD   650 mg at 01/12/21 1726  . albuterol (VENTOLIN HFA) 108 (90 Base) MCG/ACT inhaler 2 puff  2 puff Inhalation Q4H PRN Ivor Costa, MD      . allopurinol (ZYLOPRIM) tablet 300 mg  300 mg Oral Daily Ivor Costa, MD   300 mg at 01/13/21 0955  . amLODipine (NORVASC) tablet 10 mg  10 mg Oral Daily Ivor Costa, MD   10 mg at 01/13/21  4401  . aspirin EC tablet 81 mg  81 mg Oral Daily Ivor Costa, MD   81 mg at 01/13/21 0955  . carvedilol (COREG) tablet 6.25 mg  6.25 mg Oral BID Ivor Costa, MD   6.25 mg at 01/13/21 0955  . dextromethorphan-guaiFENesin (MUCINEX DM) 30-600 MG per 12 hr tablet 1 tablet  1 tablet Oral BID PRN Ivor Costa, MD   1 tablet at 01/13/21 0304  . enoxaparin (LOVENOX) injection 30 mg  30 mg Subcutaneous Q24H Ivor Costa, MD   30 mg at 01/12/21 2301  . furosemide (LASIX) injection 40 mg  40 mg Intravenous BID Donne Hazel, MD      . hydrALAZINE (APRESOLINE) injection 5 mg  5 mg Intravenous Q2H PRN Ivor Costa, MD   5 mg at 01/12/21 2052  . [START ON 01/14/2021] influenza vac split quadrivalent PF (FLUARIX) injection 0.5 mL  0.5 mL Intramuscular Tomorrow-1000 Ivor Costa, MD      . insulin aspart (novoLOG) injection 0-5 Units  0-5 Units Subcutaneous QHS Ivor Costa, MD   2 Units at 01/12/21 2302  . insulin aspart (novoLOG) injection 0-9 Units  0-9 Units  Subcutaneous TID WC Ivor Costa, MD   1 Units at 01/13/21 0957  . Ipratropium-Albuterol (COMBIVENT) respimat 2 puff  2 puff Inhalation Q4H Ivor Costa, MD   2 puff at 01/13/21 0957  . ondansetron (ZOFRAN) injection 4 mg  4 mg Intravenous Q8H PRN Ivor Costa, MD      . pantoprazole (PROTONIX) EC tablet 40 mg  40 mg Oral Daily Ivor Costa, MD   40 mg at 01/13/21 0272      Allergies: Allergies  Allergen Reactions  . Ace Inhibitors Anaphylaxis  . Lisinopril Anaphylaxis  . Lipitor [Atorvastatin] Other (See Comments)    Muscle aches      Past Medical History: Past Medical History:  Diagnosis Date  . Acid reflux   . Asthma   . Diabetes mellitus without complication ([place])   . Hypertension      Past Surgical History: Past Surgical History:  Procedure Laterality Date  . BOWEL RESECTION  02/09/2020   Procedure: SMALL BOWEL RESECTION;  Surgeon: Olean Ree, MD;  Location: [facility] ORS;  Service: General;;  . LAPAROTOMY N/A 02/09/2020   Procedure: EXPLORATORY LAPAROTOMY;  Surgeon: Olean Ree, MD;  Location: [facility] ORS;  Service: General;  Laterality: N/A;     Family History: Family History  Problem Relation Age of Onset  . Breast cancer Neg Hx      Social History: Social History   Socioeconomic History  . Marital status: Single    Spouse name: Not on file  . Number of children: Not on file  . Years of education: Not on file  . Highest education level: Not on file  Occupational History  . Not on file  Tobacco Use  . Smoking status: Never Smoker  . Smokeless tobacco: Never Used  Substance and Sexual Activity  . Alcohol use: Never  . Drug use: Never  . Sexual activity: Not Currently  Other Topics Concern  . Not on file  Social History Narrative  . Not on file   Social Determinants of Health   Financial Resource Strain: Not on file  Food Insecurity: Not on file  Transportation Needs: Not on file  Physical Activity: Not on file  Stress: Not on file  Social Connections:  Not on file  Intimate Partner Violence: Not on file   Gen: Denies any fevers or chills HEENT: No  vision or hearing problems CV: No chest pain or shortness of breath, ++ LE edema Resp: No cough or sputum production GI: No nausea, vomiting or diarrhea.  No blood in the stool GU : No problems with voiding.  No hematuria.  No previous history of kidney problems MS: Ambulatory.  Denies any acute joint pain or swelling Derm:   No complaints Psych: No complaints Heme: No complaints Neuro: No complaints Endocrine: No complaints   Vital Signs: Blood pressure (!) 173/93, pulse 76, temperature 98.2 F (36.8 C), temperature source Oral, resp. rate (!) 21, height 4\' 9"  (1.448 m), weight 90.4 kg, SpO2 100 %.   Intake/Output Summary (Last 24 hours) at 01/13/2021 1144 Last data filed at 01/13/2021 1023 Gross per 24 hour  Intake 120 ml  Output 600 ml  Net -480 ml    Weight trends: [employer]   01/11/21 2211  Weight: 90.4 kg   Physical Exam: General:  No acute distress, sitting up in the chair  HEENT  anicteric, moist oral mucous membrane  Pulm/lungs  normal breathing effort, lungs are clear to auscultation  CVS/Heart  regular rhythm, no rub or gallop  Abdomen:   Soft, nontender  Extremities:  ++ peripheral edema  Neurologic:  Alert, oriented, able to follow commands  Skin:  No acute rashes     Lab results: Basic Metabolic Panel: Recent Labs  Lab 01/11/21 2156 01/13/21 0310  NA 140 140  K 3.3* 3.5  CL 107 108  CO2 22 23  GLUCOSE 131* 172*  BUN 36* 37*  CREATININE 2.31* 2.28*  CALCIUM 9.3 9.4  MG 1.4*  --     Liver Function Tests: No results for input(s): AST, ALT, ALKPHOS, BILITOT, PROT, ALBUMIN in the last 168 hours. No results for input(s): LIPASE, AMYLASE in the last 168 hours. No results for input(s): AMMONIA in the last 168 hours.  CBC: Recent Labs  Lab 01/11/21 2156 01/13/21 0310  WBC 5.9 6.6  HGB 10.1* 10.3*  HCT 30.8* 32.9*  MCV 90.1 91.6  PLT 295  341    Cardiac Enzymes: No results for input(s): CKTOTAL, TROPONINI in the last 168 hours.  BNP: Invalid input(s): POCBNP  CBG: Recent Labs  Lab 01/12/21 1141 01/12/21 1644 01/12/21 2115 01/13/21 0817 01/13/21 1112  GLUCAP 150* 178* 216* 129* 174*    Microbiology: Recent Results (from the past 720 hour(s))  SARS CORONAVIRUS 2 (TAT 6-24 HRS) Nasopharyngeal Nasopharyngeal Swab     Status: None   Collection Time: 01/12/21  3:50 AM   Specimen: Nasopharyngeal Swab  Result Value Ref Range Status   SARS Coronavirus 2 NEGATIVE NEGATIVE Final    Comment: (NOTE) SARS-CoV-2 target nucleic acids are NOT DETECTED.  The SARS-CoV-2 RNA is generally detectable in upper and lower respiratory specimens during the acute phase of infection. Negative results do not preclude SARS-CoV-2 infection, do not rule out co-infections with other pathogens, and should not be used as the sole basis for treatment or other patient management decisions. Negative results must be combined with clinical observations, patient history, and epidemiological information. The expected result is Negative.  Fact Sheet for Patients: SugarRoll.be  Fact Sheet for Healthcare Providers: https://www.woods-mathews.com/  This test is not yet approved or cleared by the [place] FDA and  has been authorized for detection and/or diagnosis of SARS-CoV-2 by FDA under an Emergency Use Authorization (EUA). This EUA will remain  in effect (meaning this test can be used) for the duration of the COVID-19 declaration under Se ction  564(b)(1) of the Act, 21 U.S.C. section 360bbb-3(b)(1), unless the authorization is terminated or revoked sooner.  Performed at [facility], [place] [address], [place], [place] [address]      Coagulation Studies: No results for input(s): LABPROT, INR in the last 72 hours.  Urinalysis: No results for input(s): COLORURINE, LABSPEC, PHURINE,  GLUCOSEU, HGBUR, BILIRUBINUR, KETONESUR, PROTEINUR, UROBILINOGEN, NITRITE, LEUKOCYTESUR in the last 72 hours.  Invalid input(s): APPERANCEUR      Imaging: DG Chest 2 View  Result Date: 01/12/2021 CLINICAL DATA:  Worsening shortness of breath for 2 weeks EXAM: CHEST - 2 VIEW COMPARISON:  Radiograph 02/09/2020 FINDINGS: Mild pulmonary vascular congestion within enlarged, lobular cardiac silhouette. The aorta is calcified. The remaining cardiomediastinal contours are unremarkable. Low volumes and atelectatic changes are present with vascular crowding. More bandlike opacity in the left mid lung, likely atelectatic or scarring. No other focal consolidative process. No pneumothorax or visible effusion. No acute osseous or soft tissue abnormality. Degenerative changes are present in the imaged spine and shoulders. IMPRESSION: 1. Pulmonary vascular congestion and enlarged cardiac silhouette, could reflect cardiomegaly or pericardial effusion given lobular cardiac contours. 2. Low volumes and atelectasis. Electronically Signed   By: Lovena Le M.D.   On: 01/12/2021 01:57   CT Chest Wo Contrast  Result Date: 01/12/2021 CLINICAL DATA:  60 year old female with increasing shortness of breath for 2 weeks. Lower extremity edema. EXAM: CT CHEST WITHOUT CONTRAST TECHNIQUE: Multidetector CT imaging of the chest was performed following the standard protocol without IV contrast. COMPARISON:  Chest radiographs earlier today, 02/09/2020. CT Abdomen and Pelvis 11/09/2008. FINDINGS: Cardiovascular: Moderate cardiomegaly is new since 2009. Small volume pericardial effusion also present, but most pronounced at the cardiac base, proximal great vessels. Calcified aortic atherosclerosis. Calcified coronary artery atherosclerosis on series 2, image 62. Vascular patency is not evaluated in the absence of IV contrast. Mediastinum/Nodes: No lymphadenopathy. A small gastric hiatal hernia is stable since 2009. Lungs/Pleura: Mild  respiratory motion. Major airways are patent. Mild perihilar and retrocardiac atelectasis, primarily in the lingula and right middle lobe. No pleural effusion. No pulmonary edema, but there is a solitary small focus of nonspecific ground-glass opacity in the right mid lung (coronal image 78) near the minor fissure. Upper Abdomen: Chronic hepatomegaly, but otherwise negative visible noncontrast liver. No splenomegaly. Incidental splenule. (Normal variant). Negative visible noncontrast adrenal glands, left kidney, and other bowel in the upper abdomen. Musculoskeletal: Motion artifact most affecting the ribs. No acute osseous abnormality identified. IMPRESSION: 1. Moderate cardiomegaly is new since 2009 and there is a small or trace pericardial effusion. Calcified coronary artery and Aortic Atherosclerosis (ICD10-I70.0). 2. Solitary small focus of ground-glass opacity in the right middle lobe such that mild or developing Bronchopneumonia is not excluded. But there is no generalized pulmonary edema, no pleural effusion. There is mild atelectasis. 3. Chronic hepatomegaly. Small gastric hiatal hernia. Electronically Signed   By: Genevie Ann M.D.   On: 01/12/2021 04:51   ECHOCARDIOGRAM COMPLETE  Result Date: 01/12/2021    ECHOCARDIOGRAM REPORT   Patient Name:   Brianna [name] Date of Exam: 01/12/2021 Medical Rec #:  [id number]           Height:       57.0 in Accession #:    [id number]          Weight:       199.3 lb Date of Birth:  18-May-1961           BSA:  1.796 m Patient Age:    64 years            BP:           164/60 mmHg Patient Gender: F                   HR:           84 bpm. Exam Location:  [facility] Procedure: 2D Echo, Cardiac Doppler and Color Doppler Indications:     CHF-acute diastolic P50.93  History:         Patient has no prior history of Echocardiogram examinations.                  Risk Factors:Hypertension and Diabetes.  Sonographer:     Sherrie Sport RDCS (AE) Referring Phys:  [id number] Soledad Gerlach NIU  Diagnosing Phys: Bartholome Bill MD IMPRESSIONS  1. Left ventricular ejection fraction, by estimation, is 70 to 75%. The left ventricle has hyperdynamic function. The left ventricle has no regional wall motion abnormalities. There is mild left ventricular hypertrophy. Left ventricular diastolic parameters are consistent with Grade I diastolic dysfunction (impaired relaxation).  2. Right ventricular systolic function is normal. The right ventricular size is normal.  3. The mitral valve is grossly normal. Trivial mitral valve regurgitation.  4. The aortic valve is grossly normal. Aortic valve regurgitation is trivial. No aortic stenosis is present. FINDINGS  Left Ventricle: Left ventricular ejection fraction, by estimation, is 70 to 75%. The left ventricle has hyperdynamic function. The left ventricle has no regional wall motion abnormalities. The left ventricular internal cavity size was normal in size. There is mild left ventricular hypertrophy. Left ventricular diastolic parameters are consistent with Grade I diastolic dysfunction (impaired relaxation). Right Ventricle: The right ventricular size is normal. No increase in right ventricular wall thickness. Right ventricular systolic function is normal. Left Atrium: Left atrial size was normal in size. Right Atrium: Right atrial size was normal in size. Pericardium: There is no evidence of pericardial effusion. Mitral Valve: The mitral valve is grossly normal. Trivial mitral valve regurgitation. Tricuspid Valve: The tricuspid valve is not well visualized. Tricuspid valve regurgitation is trivial. Aortic Valve: The aortic valve is grossly normal. Aortic valve regurgitation is trivial. No aortic stenosis is present. Aortic valve mean gradient measures 4.0 mmHg. Aortic valve peak gradient measures 6.4 mmHg. Aortic valve area, by VTI measures 2.15 cm. Pulmonic Valve: The pulmonic valve was not assessed. Pulmonic valve regurgitation is not visualized. Aorta: The aortic root  is normal in size and structure. IAS/Shunts: The interatrial septum was not well visualized.  LEFT VENTRICLE PLAX 2D LVIDd:         5.00 cm  Diastology LVIDs:         3.01 cm  LV e' medial:    5.55 cm/s LV PW:         1.29 cm  LV E/e' medial:  12.0 LV IVS:        1.04 cm  LV e' lateral:   5.22 cm/s LVOT diam:     2.00 cm  LV E/e' lateral: 12.8 LV SV:         52 LV SV Index:   29 LVOT Area:     3.14 cm  RIGHT VENTRICLE RV Basal diam:  3.68 cm RV S prime:     13.90 cm/s TAPSE (M-mode): 4.5 cm LEFT ATRIUM             Index       RIGHT  ATRIUM           Index LA diam:        3.10 cm 1.73 cm/m  RA Area:     15.80 cm LA Vol (A2C):   53.2 ml 29.62 ml/m RA Volume:   39.00 ml  21.72 ml/m LA Vol (A4C):   63.7 ml 35.47 ml/m LA Biplane Vol: 64.5 ml 35.91 ml/m  AORTIC VALVE                   PULMONIC VALVE AV Area (Vmax):    2.20 cm    PV Vmax:        1.00 m/s AV Area (Vmean):   2.03 cm    PV Peak grad:   4.0 mmHg AV Area (VTI):     2.15 cm    RVOT Peak grad: 7 mmHg AV Vmax:           126.00 cm/s AV Vmean:          92.500 cm/s AV VTI:            0.244 m AV Peak Grad:      6.4 mmHg AV Mean Grad:      4.0 mmHg LVOT Vmax:         88.10 cm/s LVOT Vmean:        59.900 cm/s LVOT VTI:          0.167 m LVOT/AV VTI ratio: 0.68  AORTA Ao Root diam: 3.30 cm MITRAL VALVE               TRICUSPID VALVE MV Area (PHT): 3.56 cm    TR Peak grad:   11.3 mmHg MV Decel Time: 213 msec    TR Vmax:        168.00 cm/s MV E velocity: 66.80 cm/s MV A velocity: 76.30 cm/s  SHUNTS MV E/A ratio:  0.88        Systemic VTI:  0.17 m                            Systemic Diam: 2.00 cm Bartholome Bill MD Electronically signed by Bartholome Bill MD Signature Date/Time: 01/12/2021/4:38:08 PM    Final       Assessment & Plan: Pt is a 60 y.o.   female with hypertension, diabetes, asthma, GERD, gout, ventral hernia, CKD stage IV, obesity, was admitted on 01/12/2021 with Shortness of breath [R06.02] Bronchopneumonia [J18.0] Community acquired pneumonia of right  middle lobe of lung [J18.9]   January 12, 2021.  2D echo.  LVEF 70 to 05%, grade 1 diastolic dysfunction   #Chronic kidney disease stage IV.   Baseline creatinine of 2.29/GFR 23 from February 14, 2020 Presenting creatinine of 2.31/GFR 24 Underlying CKD likely secondary to diabetes, atherosclerosis and hypertension  #Lower extremity edema  #Proteinuria Previous protein creatinine ratio 1.8 in 2021  #Diabetes type 2 with CKD Lab Results  Component Value Date   HGBA1C 7.2 (H) 02/09/2020    # Hypertension Blood pressure 173/93 Regimen includes amlodipine 10 mg daily Carvedilol 6.25 mg twice a day Lasix 40 mg IV twice a day    Plan: Urinalysis Renal ultrasound Obtain screening serologies Currently being diuresed with furosemide IV 40 mg twice a day-watch for hypotension -Place compression socks -Avoid ACE inhibitor/ARB -Avoid nonsteroidals -Avoid IV contrast exposure -Avoid hypotension      LOS: 1 Gautham Hewins 1/14/202211:44 AM    Note: This note was prepared with  Sales executive. Any transcription errors are unintentional

## 2021-01-14 LAB — HEPATITIS C ANTIBODY: HCV Ab: NONREACTIVE

## 2021-01-14 LAB — GLUCOSE, CAPILLARY
Glucose-Capillary: 120 mg/dL — ABNORMAL HIGH (ref 70–99)
Glucose-Capillary: 192 mg/dL — ABNORMAL HIGH (ref 70–99)
Glucose-Capillary: 144 mg/dL — ABNORMAL HIGH (ref 70–99)
Glucose-Capillary: 207 mg/dL — ABNORMAL HIGH (ref 70–99)

## 2021-01-14 LAB — HEPATITIS B SURFACE ANTIBODY,QUALITATIVE: Hep B S Ab: REACTIVE — AB

## 2021-01-14 LAB — HEPATITIS B CORE ANTIBODY, TOTAL: Hep B Core Total Ab: NONREACTIVE

## 2021-01-14 LAB — MAGNESIUM: Magnesium: 1.5 mg/dL — ABNORMAL LOW (ref 1.7–2.4)

## 2021-01-14 LAB — HEPATITIS B SURFACE ANTIGEN: Hepatitis B Surface Ag: NONREACTIVE

## 2021-01-14 MED ORDER — LORATADINE 10 MG PO TABS
10.0000 mg | ORAL_TABLET | Freq: Every day | ORAL | Status: DC
  Administered 2021-01-14 – 2021-01-19 (×6): 10 mg via ORAL
  Filled 2021-01-14 (×6): qty 1

## 2021-01-14 NOTE — Progress Notes (Signed)
PROGRESS NOTE    Brianna [name]  GEX:[id number] DOB: 1961/02/01 DOA: 01/12/2021 PCP: Donnie Coffin, MD    Brief Narrative:  60 y.o. female with medical history significant of hypertension, diabetes mellitus, asthma, GERD, gout, ventral hernia, CKD stage IV, obesity, who presents with shortness breath.  Patient states that she has been having shortness of breath for more than 4 weeks, which is worsening the past 2 weeks, and progressively worsening.  Patient has dry cough, no chest pain, fever or chills. Pt has severe bilateral lower leg edema.  No history of CHF, DVT or PE. She is taking Lasix 40 mg prn. She has been vaccinated for COVID-19.States she has been using albuterol nebulizer treatments at home with minimal relief.   ED Course: pt was found to have WBC 5.9, BMP 16.8, pending COVID-19 PCR, potassium 3.3, renal function close to baseline, temperature normal, blood pressure 156/96, heart rate 88, RR 25, oxygen saturation 97% on room air.  Chest x-ray showed vascular congestion and cardiomegaly.  CT of the chest showed moderate cardiomegaly, small focus of infiltration in right middle lobe  Assessment & Plan:   Principal Problem:   Bronchopneumonia Active Problems:   Hypertension   Type II diabetes mellitus with renal manifestations (HCC)   Acid reflux   Asthma   CKD (chronic kidney disease), stage IV (HCC)   Hypokalemia   Possible bronchopneumonia ruled out:  -Patient has been having shortness of breath for more than 4 weeks, which has been worsening in the past 2 weeks.  No fever or leukocytosis.   -CT of the chest showed small focus of infiltrate in the right middle lobe indicating possible bronchopneumonia.   -Clinically not septic.   -Presented grossly volume overloaded with notable improvement after one dose of IV lasix at time of presentation, thus suspect sob related to volume overload and not PNA -Have discontinued abx -continued on IV lasix 40mg   BID -Nephrology following per below -Blood cx neg thus far -2d echo reviewed. Findings of EF 70-75%  Hypertension: -IV hydralazine as needed -Amlodipine, Coreg -Now on IV lasix  Type II diabetes mellitus with renal manifestations [facility]): Recent A1c 7.2, poorly controlled.  Patient is taking Humulin at home -Contniue with sliding scale insulin as needed  Acid reflux -Continue protonix  Asthma: -Bronchodilators as above  CKD (chronic kidney disease), stage IV ([place]): Baseline creatinine 2.2-2.8 recently.  Her creatinine is at 2.09, BUN 36, close to baseline. -Tolerating lasix thus far -Nephrology following. Of note, pt was referred to oupt Nephrology with first appointment originally scheduled for next week  Hypokalemia: K= 3.3 on admission. - Repleted k -Recheck bmet in AM   DVT prophylaxis: Lovenox subq Code Status: Full Family Communication: Pt in room, family not at bedside  Status is: Inpatient  Remains inpatient appropriate because:IV treatments appropriate due to intensity of illness or inability to take PO and Inpatient level of care appropriate due to severity of illness   Dispo: The patient is from: Home              Anticipated d/c is to: Home              Anticipated d/c date is: 3 days              Patient currently is not medically stable to d/c.  Consultants:   Nephrology  Procedures:     Antimicrobials: Anti-infectives (From admission, onward)   Start     Dose/Rate Route Frequency Ordered Stop  01/13/21 0630  azithromycin (ZITHROMAX) 500 mg in sodium chloride 0.9 % 250 mL IVPB  Status:  Discontinued        500 mg 250 mL/hr over 60 Minutes Intravenous Every 24 hours 01/12/21 0715 01/13/21 1055   01/13/21 0600  cefTRIAXone (ROCEPHIN) 1 g in sodium chloride 0.9 % 100 mL IVPB  Status:  Discontinued        1 g 200 mL/hr over 30 Minutes Intravenous Every 24 hours 01/12/21 0715 01/13/21 1055   01/12/21 0530  cefTRIAXone (ROCEPHIN) 1 g in sodium  chloride 0.9 % 100 mL IVPB        1 g 200 mL/hr over 30 Minutes Intravenous  Once 01/12/21 0516 01/12/21 0627   01/12/21 0530  azithromycin (ZITHROMAX) 500 mg in sodium chloride 0.9 % 250 mL IVPB        500 mg 250 mL/hr over 60 Minutes Intravenous  Once 01/12/21 0516 01/12/21 0921      Subjective: Reports feeling better. Ambulating in the hallway  Objective: Vitals:   01/13/21 2330 01/14/21 0437 01/14/21 0810 01/14/21 1118  BP: (!) 155/79 (!) 145/76 (!) 165/78 (!) 171/76  Pulse: 77 66 69 69  Resp: 20 20 20 20   Temp: 98.4 F (36.9 C) 98.6 F (37 C) 98.9 F (37.2 C) 98.7 F (37.1 C)  TempSrc: Oral Oral Oral Oral  SpO2: 99%  99% 98%  Weight:      Height:        Intake/Output Summary (Last 24 hours) at 01/14/2021 1541 Last data filed at 01/14/2021 0600 Gross per 24 hour  Intake 240 ml  Output 1400 ml  Net -1160 ml   Filed Weights   01/11/21 2211  Weight: 90.4 kg    Examination: General exam: Awake, laying in bed, in nad Respiratory system: Normal respiratory effort, no wheezing Cardiovascular system: regular rate, s1, s2 Gastrointestinal system: Soft, nondistended, positive BS Central nervous system: CN2-12 grossly intact, strength intact Extremities: Perfused, no clubbing, BLE edema Skin: Normal skin turgor, no notable skin lesions seen Psychiatry: Mood normal // no visual hallucinations   Data Reviewed: I have personally reviewed following labs and imaging studies  CBC: Recent Labs  Lab 01/11/21 2156 01/13/21 0310  WBC 5.9 6.6  HGB 10.1* 10.3*  HCT 30.8* 32.9*  MCV 90.1 91.6  PLT 295 431   Basic Metabolic Panel: Recent Labs  Lab 01/11/21 2156 01/13/21 0310 01/14/21 0450  NA 140 140  --   K 3.3* 3.5  --   CL 107 108  --   CO2 22 23  --   GLUCOSE 131* 172*  --   BUN 36* 37*  --   CREATININE 2.31* 2.28*  --   CALCIUM 9.3 9.4  --   MG 1.4*  --  1.5*   GFR: Estimated Creatinine Clearance: 24.9 mL/min (A) (by C-G formula based on SCr of 2.28  mg/dL (H)). Liver Function Tests: No results for input(s): AST, ALT, ALKPHOS, BILITOT, PROT, ALBUMIN in the last 168 hours. No results for input(s): LIPASE, AMYLASE in the last 168 hours. No results for input(s): AMMONIA in the last 168 hours. Coagulation Profile: No results for input(s): INR, PROTIME in the last 168 hours. Cardiac Enzymes: No results for input(s): CKTOTAL, CKMB, CKMBINDEX, TROPONINI in the last 168 hours. BNP (last 3 results) No results for input(s): PROBNP in the last 8760 hours. HbA1C: No results for input(s): HGBA1C in the last 72 hours. CBG: Recent Labs  Lab 01/13/21 1112 01/13/21 1633 01/13/21  2215 01/14/21 0814 01/14/21 1117  GLUCAP 174* 157* 183* 120* 192*   Lipid Profile: No results for input(s): CHOL, HDL, LDLCALC, TRIG, CHOLHDL, LDLDIRECT in the last 72 hours. Thyroid Function Tests: No results for input(s): TSH, T4TOTAL, FREET4, T3FREE, THYROIDAB in the last 72 hours. Anemia Panel: No results for input(s): VITAMINB12, FOLATE, FERRITIN, TIBC, IRON, RETICCTPCT in the last 72 hours. Sepsis Labs: No results for input(s): PROCALCITON, LATICACIDVEN in the last 168 hours.  Recent Results (from the past 240 hour(s))  SARS CORONAVIRUS 2 (TAT 6-24 HRS) Nasopharyngeal Nasopharyngeal Swab     Status: None   Collection Time: 01/12/21  3:50 AM   Specimen: Nasopharyngeal Swab  Result Value Ref Range Status   SARS Coronavirus 2 NEGATIVE NEGATIVE Final    Comment: (NOTE) SARS-CoV-2 target nucleic acids are NOT DETECTED.  The SARS-CoV-2 RNA is generally detectable in upper and lower respiratory specimens during the acute phase of infection. Negative results do not preclude SARS-CoV-2 infection, do not rule out co-infections with other pathogens, and should not be used as the sole basis for treatment or other patient management decisions. Negative results must be combined with clinical observations, patient history, and epidemiological information. The  expected result is Negative.  Fact Sheet for Patients: SugarRoll.be  Fact Sheet for Healthcare Providers: https://www.woods-mathews.com/  This test is not yet approved or cleared by the [place] FDA and  has been authorized for detection and/or diagnosis of SARS-CoV-2 by FDA under an Emergency Use Authorization (EUA). This EUA will remain  in effect (meaning this test can be used) for the duration of the COVID-19 declaration under Se ction 564(b)(1) of the Act, 21 U.S.C. section 360bbb-3(b)(1), unless the authorization is terminated or revoked sooner.  Performed at [facility], [place] [address] [place], [place] [address]   Culture, blood (Routine X 2) w Reflex to ID Panel     Status: None (Preliminary result)   Collection Time: 01/13/21  7:06 AM   Specimen: BLOOD  Result Value Ref Range Status   Specimen Description BLOOD LEFT [facility]  Final   Special Requests   Final    BOTTLES DRAWN AEROBIC AND ANAEROBIC Blood Culture adequate volume   Culture   Final    NO GROWTH 1 DAY Performed at [facility], [address] [place], [place] [address]    Report Status PENDING  Incomplete  Culture, blood (Routine X 2) w Reflex to ID Panel     Status: None (Preliminary result)   Collection Time: 01/13/21  7:14 AM   Specimen: BLOOD  Result Value Ref Range Status   Specimen Description BLOOD RIGHT HAND  Final   Special Requests   Final    BOTTLES DRAWN AEROBIC AND ANAEROBIC Blood Culture results may not be optimal due to an excessive volume of blood received in culture bottles   Culture   Final    NO GROWTH 1 DAY Performed at [facility], [address] [place], [place] [address]    Report Status PENDING  Incomplete     Radiology Studies: US RENAL  Result Date: 01/13/2021 CLINICAL DATA:  None EXAM: RENAL / URINARY TRACT ULTRASOUND COMPLETE COMPARISON:  None. FINDINGS: Right Kidney: Renal measurements: 9.7 x 5 x 5.3  cm = volume: 135 mL. Cortex is echogenic. No hydronephrosis or mass Left Kidney: Renal measurements: 10.3 x 6 x 5.6 cm = volume: 184 mL. Cortex is echogenic. No hydronephrosis or mass Bladder: Appears normal for degree of bladder distention. Other: None.  IMPRESSION: Echogenic kidneys bilaterally consistent with medical renal disease. Negative for hydronephrosis Electronically Signed   By: Donavan Foil M.D.   On: 01/13/2021 20:28    Scheduled Meds: . allopurinol  300 mg Oral Daily  . amLODipine  10 mg Oral Daily  . aspirin EC  81 mg Oral Daily  . carvedilol  6.25 mg Oral BID  . enoxaparin (LOVENOX) injection  30 mg Subcutaneous Q24H  . furosemide  40 mg Intravenous BID  . influenza vac split quadrivalent PF  0.5 mL Intramuscular Tomorrow-1000  . insulin aspart  0-5 Units Subcutaneous QHS  . insulin aspart  0-9 Units Subcutaneous TID WC  . Ipratropium-Albuterol  2 puff Inhalation Q4H  . loratadine  10 mg Oral Daily  . pantoprazole  40 mg Oral Daily   Continuous Infusions:   LOS: 2 days   Marylu Lund, MD [facility] Pager On Amion  If 7PM-7AM, please contact night-coverage 01/14/2021, 3:41 PM

## 2021-01-14 NOTE — Progress Notes (Signed)
[facility]  ROUNDING NOTE   Subjective:   Seen resting in bed, in no acute distress.  She reports shortness of breath is still present but better.  Appetite is fair, no reported nausea or vomiting.  Objective:  Vital signs in last 24 hours:  Temp:  [97.7 F (36.5 C)-98.9 F (37.2 C)] 98.7 F (37.1 C) (01/15 1118) Pulse Rate:  [66-77] 69 (01/15 1118) Resp:  [16-20] 20 (01/15 1118) BP: (128-180)/(67-82) 171/76 (01/15 1118) SpO2:  [97 %-99 %] 98 % (01/15 1118)  Weight change:  Filed Weights   01/11/21 2211  Weight: 90.4 kg    Intake/Output: I/O last 3 completed shifts: In: 360 [P.O.:360] Out: 2000 [Urine:2000]   Intake/Output this shift:  No intake/output data recorded.  Physical Exam: General:  Resting in bed, in no acute distress  Head: Normocephalic, atraumatic. Moist oral mucosal membranes  Eyes: Anicteric  Lungs:   Respirations even, dyspnea on exertion, lungs clear but diminished at the bases  Heart: Regular rate and rhythm  Abdomen:  Soft, nontender, nondistended  Extremities:  1+ peripheral edema.  Neurologic:  Awake, alert, speech clear and appropriate  Skin: No lesions or rashes    Basic Metabolic Panel: Recent Labs  Lab 01/11/21 2156 01/13/21 0310 01/14/21 0450  NA 140 140  --   K 3.3* 3.5  --   CL 107 108  --   CO2 22 23  --   GLUCOSE 131* 172*  --   BUN 36* 37*  --   CREATININE 2.31* 2.28*  --   CALCIUM 9.3 9.4  --   MG 1.4*  --  1.5*    Liver Function Tests: No results for input(s): AST, ALT, ALKPHOS, BILITOT, PROT, ALBUMIN in the last 168 hours. No results for input(s): LIPASE, AMYLASE in the last 168 hours. No results for input(s): AMMONIA in the last 168 hours.  CBC: Recent Labs  Lab 01/11/21 2156 01/13/21 0310  WBC 5.9 6.6  HGB 10.1* 10.3*  HCT 30.8* 32.9*  MCV 90.1 91.6  PLT 295 341    Cardiac Enzymes: No results for input(s): CKTOTAL, CKMB, CKMBINDEX, TROPONINI in the last 168 hours.  BNP: Invalid  input(s): POCBNP  CBG: Recent Labs  Lab 01/13/21 1112 01/13/21 1633 01/13/21 2215 01/14/21 0814 01/14/21 1117  GLUCAP 174* 157* 183* 120* 192*    Microbiology: Results for orders placed or performed during the hospital encounter of 01/12/21  SARS CORONAVIRUS 2 (TAT 6-24 HRS) Nasopharyngeal Nasopharyngeal Swab     Status: None   Collection Time: 01/12/21  3:50 AM   Specimen: Nasopharyngeal Swab  Result Value Ref Range Status   SARS Coronavirus 2 NEGATIVE NEGATIVE Final    Comment: (NOTE) SARS-CoV-2 target nucleic acids are NOT DETECTED.  The SARS-CoV-2 RNA is generally detectable in upper and lower respiratory specimens during the acute phase of infection. Negative results do not preclude SARS-CoV-2 infection, do not rule out co-infections with other pathogens, and should not be used as the sole basis for treatment or other patient management decisions. Negative results must be combined with clinical observations, patient history, and epidemiological information. The expected result is Negative.  Fact Sheet for Patients: SugarRoll.be  Fact Sheet for Healthcare Providers: https://www.woods-mathews.com/  This test is not yet approved or cleared by the [place] FDA and  has been authorized for detection and/or diagnosis of SARS-CoV-2 by FDA under an Emergency Use Authorization (EUA). This EUA will remain  in effect (meaning this test can be used) for the  duration of the COVID-19 declaration under Se ction 564(b)(1) of the Act, 21 U.S.C. section 360bbb-3(b)(1), unless the authorization is terminated or revoked sooner.  Performed at [facility], [place] [address], [place], [place] [address]   Culture, blood (Routine X 2) w Reflex to ID Panel     Status: None (Preliminary result)   Collection Time: 01/13/21  7:06 AM   Specimen: BLOOD  Result Value Ref Range Status   Specimen Description BLOOD LEFT [facility]  Final   Special  Requests   Final    BOTTLES DRAWN AEROBIC AND ANAEROBIC Blood Culture adequate volume   Culture   Final    NO GROWTH 1 DAY Performed at [facility], [address] [place], [place] [address]    Report Status PENDING  Incomplete  Culture, blood (Routine X 2) w Reflex to ID Panel     Status: None (Preliminary result)   Collection Time: 01/13/21  7:14 AM   Specimen: BLOOD  Result Value Ref Range Status   Specimen Description BLOOD RIGHT HAND  Final   Special Requests   Final    BOTTLES DRAWN AEROBIC AND ANAEROBIC Blood Culture results may not be optimal due to an excessive volume of blood received in culture bottles   Culture   Final    NO GROWTH 1 DAY Performed at [facility], [place] [place] [place],  [address]    Report Status PENDING  Incomplete    Coagulation Studies: No results for input(s): LABPROT, INR in the last 72 hours.  Urinalysis: No results for input(s): COLORURINE, LABSPEC, PHURINE, GLUCOSEU, HGBUR, BILIRUBINUR, KETONESUR, PROTEINUR, UROBILINOGEN, NITRITE, LEUKOCYTESUR in the last 72 hours.  Invalid input(s): APPERANCEUR    Imaging: US RENAL  Result Date: 01/13/2021 CLINICAL DATA:  None EXAM: RENAL / URINARY TRACT ULTRASOUND COMPLETE COMPARISON:  None. FINDINGS: Right Kidney: Renal measurements: 9.7 x 5 x 5.3 cm = volume: 135 mL. Cortex is echogenic. No hydronephrosis or mass Left Kidney: Renal measurements: 10.3 x 6 x 5.6 cm = volume: 184 mL. Cortex is echogenic. No hydronephrosis or mass Bladder: Appears normal for degree of bladder distention. Other: None. IMPRESSION: Echogenic kidneys bilaterally consistent with medical renal disease. Negative for hydronephrosis Electronically Signed   By: Donavan Foil M.D.   On: 01/13/2021 20:28   ECHOCARDIOGRAM COMPLETE  Result Date: 01/12/2021    ECHOCARDIOGRAM REPORT   Patient Name:   [name] Date of Exam: 01/12/2021 Medical Rec #:  [id number]           Height:       57.0 in Accession  #:    [id number]          Weight:       199.3 lb Date of Birth:  August 20, 1961           BSA:          1.796 m Patient Age:    60 years            BP:           164/60 mmHg Patient Gender: F                   HR:           84 bpm. Exam Location:  [facility] Procedure: 2D Echo, Cardiac Doppler and Color Doppler Indications:     CHF-acute diastolic G29.52  History:         Patient has no prior history of Echocardiogram examinations.  Risk Factors:Hypertension and Diabetes.  Sonographer:     Sherrie Sport RDCS (AE) Referring Phys:  [id number] Soledad Gerlach NIU Diagnosing Phys: Bartholome Bill MD IMPRESSIONS  1. Left ventricular ejection fraction, by estimation, is 70 to 75%. The left ventricle has hyperdynamic function. The left ventricle has no regional wall motion abnormalities. There is mild left ventricular hypertrophy. Left ventricular diastolic parameters are consistent with Grade I diastolic dysfunction (impaired relaxation).  2. Right ventricular systolic function is normal. The right ventricular size is normal.  3. The mitral valve is grossly normal. Trivial mitral valve regurgitation.  4. The aortic valve is grossly normal. Aortic valve regurgitation is trivial. No aortic stenosis is present. FINDINGS  Left Ventricle: Left ventricular ejection fraction, by estimation, is 70 to 75%. The left ventricle has hyperdynamic function. The left ventricle has no regional wall motion abnormalities. The left ventricular internal cavity size was normal in size. There is mild left ventricular hypertrophy. Left ventricular diastolic parameters are consistent with Grade I diastolic dysfunction (impaired relaxation). Right Ventricle: The right ventricular size is normal. No increase in right ventricular wall thickness. Right ventricular systolic function is normal. Left Atrium: Left atrial size was normal in size. Right Atrium: Right atrial size was normal in size. Pericardium: There is no evidence of pericardial effusion. Mitral Valve:  The mitral valve is grossly normal. Trivial mitral valve regurgitation. Tricuspid Valve: The tricuspid valve is not well visualized. Tricuspid valve regurgitation is trivial. Aortic Valve: The aortic valve is grossly normal. Aortic valve regurgitation is trivial. No aortic stenosis is present. Aortic valve mean gradient measures 4.0 mmHg. Aortic valve peak gradient measures 6.4 mmHg. Aortic valve area, by VTI measures 2.15 cm. Pulmonic Valve: The pulmonic valve was not assessed. Pulmonic valve regurgitation is not visualized. Aorta: The aortic root is normal in size and structure. IAS/Shunts: The interatrial septum was not well visualized.  LEFT VENTRICLE PLAX 2D LVIDd:         5.00 cm  Diastology LVIDs:         3.01 cm  LV e' medial:    5.55 cm/s LV PW:         1.29 cm  LV E/e' medial:  12.0 LV IVS:        1.04 cm  LV e' lateral:   5.22 cm/s LVOT diam:     2.00 cm  LV E/e' lateral: 12.8 LV SV:         52 LV SV Index:   29 LVOT Area:     3.14 cm  RIGHT VENTRICLE RV Basal diam:  3.68 cm RV S prime:     13.90 cm/s TAPSE (M-mode): 4.5 cm LEFT ATRIUM             Index       RIGHT ATRIUM           Index LA diam:        3.10 cm 1.73 cm/m  RA Area:     15.80 cm LA Vol (A2C):   53.2 ml 29.62 ml/m RA Volume:   39.00 ml  21.72 ml/m LA Vol (A4C):   63.7 ml 35.47 ml/m LA Biplane Vol: 64.5 ml 35.91 ml/m  AORTIC VALVE                   PULMONIC VALVE AV Area (Vmax):    2.20 cm    PV Vmax:        1.00 m/s AV Area (Vmean):   2.03 cm  PV Peak grad:   4.0 mmHg AV Area (VTI):     2.15 cm    RVOT Peak grad: 7 mmHg AV Vmax:           126.00 cm/s AV Vmean:          92.500 cm/s AV VTI:            0.244 m AV Peak Grad:      6.4 mmHg AV Mean Grad:      4.0 mmHg LVOT Vmax:         88.10 cm/s LVOT Vmean:        59.900 cm/s LVOT VTI:          0.167 m LVOT/AV VTI ratio: 0.68  AORTA Ao Root diam: 3.30 cm MITRAL VALVE               TRICUSPID VALVE MV Area (PHT): 3.56 cm    TR Peak grad:   11.3 mmHg MV Decel Time: 213 msec    TR  Vmax:        168.00 cm/s MV E velocity: 66.80 cm/s MV A velocity: 76.30 cm/s  SHUNTS MV E/A ratio:  0.88        Systemic VTI:  0.17 m                            Systemic Diam: 2.00 cm Bartholome Bill MD Electronically signed by Bartholome Bill MD Signature Date/Time: 01/12/2021/4:38:08 PM    Final      Medications:    . allopurinol  300 mg Oral Daily  . amLODipine  10 mg Oral Daily  . aspirin EC  81 mg Oral Daily  . carvedilol  6.25 mg Oral BID  . enoxaparin (LOVENOX) injection  30 mg Subcutaneous Q24H  . furosemide  40 mg Intravenous BID  . influenza vac split quadrivalent PF  0.5 mL Intramuscular Tomorrow-1000  . insulin aspart  0-5 Units Subcutaneous QHS  . insulin aspart  0-9 Units Subcutaneous TID WC  . Ipratropium-Albuterol  2 puff Inhalation Q4H  . loratadine  10 mg Oral Daily  . pantoprazole  40 mg Oral Daily   acetaminophen, albuterol, dextromethorphan-guaiFENesin, hydrALAZINE, ondansetron (ZOFRAN) IV  Assessment/ Plan:  Ms. Katrianna Friesenhahn is a 60 y.o.  female with hypertension, diabetes, asthma, GERD, gout, ventral hernia, CKD stage IV, obesity, was admitted on 01/12/2021 with Shortness of breath [R06.02] Bronchopneumonia [J18.0] Community acquired pneumonia of right middle lobe of lung [J18.9]   January 12, 2021.  2D echo.  LVEF 70 to 65%, grade 1 diastolic dysfunction  #CKD stage IV secondary to diabetes,arthrosclerosis,  and hypertension  Baseline creatinine of 2.29/GFR 23 on 02/14/2020 Lab Results  Component Value Date   CREATININE 2.28 (H) 01/13/2021   CREATININE 2.31 (H) 01/11/2021   CREATININE 2.29 (H) 02/14/2020  Renal ultrasound  IMPRESSION: Echogenic kidneys bilaterally consistent with medical renal disease. Negative for hydronephrosis  -Avoid ACE inhibitor/ARB -Avoid nonsteroidals -Avoid IV contrast exposure -Avoid hypotension  Will continue monitoring renal function closely No acute indication for dialysis  #Lower extremity  edema #Proteinuria Protein creatinine ratio 1.8 in 2021 Serological screenings in process Furosemide 40 mg IV twice daily Lower extremity edema improving  # Diabetes type 2 with CKD Lab Results  Component Value Date   HGBA1C 7.2 (H) 02/09/2020  Patient is on insulin aspart  #Hypertension Currently on amlodipine, carvedilol, and furosemide Blood pressure readings stay within acceptable range  LOS: 2 [place] Bolds 1/15/20222:11 PM

## 2021-01-15 LAB — GLUCOSE, CAPILLARY
Glucose-Capillary: 146 mg/dL — ABNORMAL HIGH (ref 70–99)
Glucose-Capillary: 154 mg/dL — ABNORMAL HIGH (ref 70–99)
Glucose-Capillary: 230 mg/dL — ABNORMAL HIGH (ref 70–99)
Glucose-Capillary: 174 mg/dL — ABNORMAL HIGH (ref 70–99)

## 2021-01-15 LAB — COMPREHENSIVE METABOLIC PANEL
ALT: 26 U/L (ref 0–44)
AST: 28 U/L (ref 15–41)
Albumin: 2.9 g/dL — ABNORMAL LOW (ref 3.5–5.0)
Alkaline Phosphatase: 72 U/L (ref 38–126)
Anion gap: 11 (ref 5–15)
BUN: 37 mg/dL — ABNORMAL HIGH (ref 6–20)
CO2: 23 mmol/L (ref 22–32)
Calcium: 9.6 mg/dL (ref 8.9–10.3)
Chloride: 107 mmol/L (ref 98–111)
Creatinine, Ser: 2.35 mg/dL — ABNORMAL HIGH (ref 0.44–1.00)
GFR, Estimated: 23 mL/min — ABNORMAL LOW (ref >60)
Glucose, Bld: 148 mg/dL — ABNORMAL HIGH (ref 70–99)
Potassium: 3.6 mmol/L (ref 3.5–5.1)
Sodium: 141 mmol/L (ref 135–145)
Total Bilirubin: 0.5 mg/dL (ref 0.3–1.2)
Total Protein: 6.5 g/dL (ref 6.5–8.1)

## 2021-01-15 LAB — MAGNESIUM: Magnesium: 1.4 mg/dL — ABNORMAL LOW (ref 1.7–2.4)

## 2021-01-15 LAB — C3 COMPLEMENT: C3 Complement: 172 mg/dL — ABNORMAL HIGH (ref 82–167)

## 2021-01-15 LAB — C4 COMPLEMENT: Complement C4, Body Fluid: 43 mg/dL — ABNORMAL HIGH (ref 12–38)

## 2021-01-15 MED ORDER — MAGNESIUM SULFATE 4 GM/100ML IV SOLN
4.0000 g | Freq: Once | INTRAVENOUS | Status: AC
  Administered 2021-01-15: 4 g via INTRAVENOUS
  Filled 2021-01-15: qty 100

## 2021-01-15 MED ORDER — POTASSIUM CHLORIDE CRYS ER 20 MEQ PO TBCR
40.0000 meq | EXTENDED_RELEASE_TABLET | Freq: Once | ORAL | Status: AC
  Administered 2021-01-15: 40 meq via ORAL
  Filled 2021-01-15: qty 2

## 2021-01-15 NOTE — Progress Notes (Signed)
[facility]  ROUNDING NOTE   Subjective:   Patient seen and evaluated at bedside. States that her shortness of breath overall is improving.  Objective:  Vital signs in last 24 hours:  Temp:  [98.1 F (36.7 C)-98.2 F (36.8 C)] 98.1 F (36.7 C) (01/16 0734) Pulse Rate:  [64-76] 64 (01/16 0734) Resp:  [16-20] 16 (01/16 0734) BP: (140-170)/(70-84) 170/84 (01/16 0734) SpO2:  [98 %-99 %] 98 % (01/16 0734)  Weight change:  Filed Weights   01/11/21 2211  Weight: 90.4 kg    Intake/Output: I/O last 3 completed shifts: In: 480 [P.O.:480] Out: 3700 [Urine:3700]   Intake/Output this shift:  Total I/O In: 240 [P.O.:240] Out: -   Physical Exam: General: No acute distress  Head: Normocephalic, atraumatic. Moist oral mucosal membranes  Eyes: Anicteric  Lungs:  Diminished bilateral but clear  Heart: Regular rate and rhythm  Abdomen:  Soft, nontender, nondistended  Extremities: 1+ peripheral edema.  Neurologic: Awake, alert, speech clear and appropriate  Skin: No lesions or rashes    Basic Metabolic Panel: Recent Labs  Lab 01/11/21 2156 01/13/21 0310 01/14/21 0450 01/15/21 0518  NA 140 140  --  141  K 3.3* 3.5  --  3.6  CL 107 108  --  107  CO2 22 23  --  23  GLUCOSE 131* 172*  --  148*  BUN 36* 37*  --  37*  CREATININE 2.31* 2.28*  --  2.35*  CALCIUM 9.3 9.4  --  9.6  MG 1.4*  --  1.5* 1.4*    Liver Function Tests: Recent Labs  Lab 01/15/21 0518  AST 28  ALT 26  ALKPHOS 72  BILITOT 0.5  PROT 6.5  ALBUMIN 2.9*   No results for input(s): LIPASE, AMYLASE in the last 168 hours. No results for input(s): AMMONIA in the last 168 hours.  CBC: Recent Labs  Lab 01/11/21 2156 01/13/21 0310  WBC 5.9 6.6  HGB 10.1* 10.3*  HCT 30.8* 32.9*  MCV 90.1 91.6  PLT 295 341    Cardiac Enzymes: No results for input(s): CKTOTAL, CKMB, CKMBINDEX, TROPONINI in the last 168 hours.  BNP: Invalid input(s): POCBNP  CBG: Recent Labs  Lab 01/14/21 1117  01/14/21 1653 01/14/21 2101 01/15/21 0731 01/15/21 1143  GLUCAP 192* 144* 207* 146* 154*    Microbiology: Results for orders placed or performed during the hospital encounter of 01/12/21  SARS CORONAVIRUS 2 (TAT 6-24 HRS) Nasopharyngeal Nasopharyngeal Swab     Status: None   Collection Time: 01/12/21  3:50 AM   Specimen: Nasopharyngeal Swab  Result Value Ref Range Status   SARS Coronavirus 2 NEGATIVE NEGATIVE Final    Comment: (NOTE) SARS-CoV-2 target nucleic acids are NOT DETECTED.  The SARS-CoV-2 RNA is generally detectable in upper and lower respiratory specimens during the acute phase of infection. Negative results do not preclude SARS-CoV-2 infection, do not rule out co-infections with other pathogens, and should not be used as the sole basis for treatment or other patient management decisions. Negative results must be combined with clinical observations, patient history, and epidemiological information. The expected result is Negative.  Fact Sheet for Patients: SugarRoll.be  Fact Sheet for Healthcare Providers: https://www.woods-mathews.com/  This test is not yet approved or cleared by the [place] FDA and  has been authorized for detection and/or diagnosis of SARS-CoV-2 by FDA under an Emergency Use Authorization (EUA). This EUA will remain  in effect (meaning this test can be used) for the duration of the  COVID-19 declaration under Se ction 564(b)(1) of the Act, 21 U.S.C. section 360bbb-3(b)(1), unless the authorization is terminated or revoked sooner.  Performed at [facility], [place] [address] [place], [place] [address]   Culture, blood (Routine X 2) w Reflex to ID Panel     Status: None (Preliminary result)   Collection Time: 01/13/21  7:06 AM   Specimen: BLOOD  Result Value Ref Range Status   Specimen Description BLOOD LEFT [facility]  Final   Special Requests   Final    BOTTLES DRAWN AEROBIC AND ANAEROBIC Blood  Culture adequate volume   Culture   Final    NO GROWTH 2 DAYS Performed at [facility], [address], [place], [place] [address]    Report Status PENDING  Incomplete  Culture, blood (Routine X 2) w Reflex to ID Panel     Status: None (Preliminary result)   Collection Time: 01/13/21  7:14 AM   Specimen: BLOOD  Result Value Ref Range Status   Specimen Description BLOOD RIGHT HAND  Final   Special Requests   Final    BOTTLES DRAWN AEROBIC AND ANAEROBIC Blood Culture results may not be optimal due to an excessive volume of blood received in culture bottles   Culture   Final    NO GROWTH 2 DAYS Performed at [facility], [address] [place], [place] [address]    Report Status PENDING  Incomplete    Coagulation Studies: No results for input(s): LABPROT, INR in the last 72 hours.  Urinalysis: No results for input(s): COLORURINE, LABSPEC, PHURINE, GLUCOSEU, HGBUR, BILIRUBINUR, KETONESUR, PROTEINUR, UROBILINOGEN, NITRITE, LEUKOCYTESUR in the last 72 hours.  Invalid input(s): APPERANCEUR    Imaging: US RENAL  Result Date: 01/13/2021 CLINICAL DATA:  None EXAM: RENAL / URINARY TRACT ULTRASOUND COMPLETE COMPARISON:  None. FINDINGS: Right Kidney: Renal measurements: 9.7 x 5 x 5.3 cm = volume: 135 mL. Cortex is echogenic. No hydronephrosis or mass Left Kidney: Renal measurements: 10.3 x 6 x 5.6 cm = volume: 184 mL. Cortex is echogenic. No hydronephrosis or mass Bladder: Appears normal for degree of bladder distention. Other: None. IMPRESSION: Echogenic kidneys bilaterally consistent with medical renal disease. Negative for hydronephrosis Electronically Signed   By: Donavan Foil M.D.   On: 01/13/2021 20:28     Medications:   . magnesium sulfate bolus IVPB     . allopurinol  300 mg Oral Daily  . amLODipine  10 mg Oral Daily  . aspirin EC  81 mg Oral Daily  . carvedilol  6.25 mg Oral BID  . enoxaparin (LOVENOX) injection  30 mg Subcutaneous Q24H  . furosemide   40 mg Intravenous BID  . insulin aspart  0-5 Units Subcutaneous QHS  . insulin aspart  0-9 Units Subcutaneous TID WC  . Ipratropium-Albuterol  2 puff Inhalation Q4H  . loratadine  10 mg Oral Daily  . pantoprazole  40 mg Oral Daily  . potassium chloride  40 mEq Oral Once   acetaminophen, albuterol, dextromethorphan-guaiFENesin, hydrALAZINE, ondansetron (ZOFRAN) IV  Assessment/ Plan:  Ms. Princes Finger is a 60 y.o.  female with hypertension, diabetes, asthma, GERD, gout, ventral hernia, CKD stage IV, obesity, was admitted on 01/12/2021 with Shortness of breath [R06.02] Bronchopneumonia [J18.0] Community acquired pneumonia of right middle lobe of lung [J18.9]   January 12, 2021.  2D echo.  LVEF 70 to 91%, grade 1 diastolic dysfunction  #CKD stage IV secondary to diabetes,arthrosclerosis,  and hypertension  Baseline creatinine of 2.29/GFR 23  on 02/14/2020 Lab Results  Component Value Date   CREATININE 2.35 (H) 01/15/2021   CREATININE 2.28 (H) 01/13/2021   CREATININE 2.31 (H) 01/11/2021  Renal ultrasound  IMPRESSION: Echogenic kidneys bilaterally consistent with medical renal disease. Negative for hydronephrosis  -Avoid ACE inhibitor/ARB -Avoid nonsteroidals -Avoid IV contrast exposure -Avoid hypotension  Creatinine currently 2.35 with an EGFR of 23.  Continue to monitor renal parameters while on IV Lasix.  #Lower extremity edema #Proteinuria Protein creatinine ratio 1.8 in 2021 Furosemide 40 mg IV twice daily Lower extremity edema improving, consider reducing to once daily tomorrow.  # Diabetes type 2 with CKD Lab Results  Component Value Date   HGBA1C 7.2 (H) 02/09/2020  Patient is on insulin aspart  #Hypertension Blood pressure labile.  Continue amlodipine, carvedilol, furosemide.  Once renal function stabilizes consider adding ARB.   LOS: 3 Munsoor Lateef 1/16/20223:23 PM

## 2021-01-15 NOTE — Progress Notes (Signed)
PROGRESS NOTE    Brianna [name]  XTG:[id number] DOB: 07/20/1961 DOA: 01/12/2021 PCP: Donnie Coffin, MD    Brief Narrative:  60 y.o. female with medical history significant of hypertension, diabetes mellitus, asthma, GERD, gout, ventral hernia, CKD stage IV, obesity, who presents with shortness breath.  Patient states that she has been having shortness of breath for more than 4 weeks, which is worsening the past 2 weeks, and progressively worsening.  Patient has dry cough, no chest pain, fever or chills. Pt has severe bilateral lower leg edema.  No history of CHF, DVT or PE. She is taking Lasix 40 mg prn. She has been vaccinated for COVID-19.States she has been using albuterol nebulizer treatments at home with minimal relief.   ED Course: pt was found to have WBC 5.9, BMP 16.8, pending COVID-19 PCR, potassium 3.3, renal function close to baseline, temperature normal, blood pressure 156/96, heart rate 88, RR 25, oxygen saturation 97% on room air.  Chest x-ray showed vascular congestion and cardiomegaly.  CT of the chest showed moderate cardiomegaly, small focus of infiltration in right middle lobe  Assessment & Plan:   Principal Problem:   Bronchopneumonia Active Problems:   Hypertension   Type II diabetes mellitus with renal manifestations (HCC)   Acid reflux   Asthma   CKD (chronic kidney disease), stage IV (HCC)   Hypokalemia   Possible bronchopneumonia ruled out:  -Patient has been having shortness of breath for more than 4 weeks, which has been worsening in the past 2 weeks.  No fever or leukocytosis.   -CT of the chest showed small focus of infiltrate in the right middle lobe indicating possible bronchopneumonia.   -Clinically not septic.   -Presented grossly volume overloaded with notable improvement after one dose of IV lasix at time of presentation, thus suspect sob related to volume overload and not PNA -Remains stable off further abx -Continue on IV lasix 40mg   BID -Nephrology continues to follow -Blood cx neg thus far -2d echo reviewed. Findings of EF 70-75%  Hypertension: -IV hydralazine as needed -Amlodipine, Coreg -Cont IV lasix  Type II diabetes mellitus with renal manifestations [facility]): Recent A1c 7.2, poorly controlled.  Patient is taking Humulin at home -Contniue with sliding scale insulin as needed  Acid reflux -Continue protonix  Asthma: -Bronchodilators as above  CKD (chronic kidney disease), stage IV ([place]): Baseline creatinine 2.2-2.8 recently.  Her creatinine is at 2.09, BUN 36, close to baseline. -Tolerating lasix thus far -Nephrology following. Of note, pt was referred to oupt Nephrology with first appointment originally scheduled for next week  Hypokalemia: K= 3.3 on admission. - Replaced -Recheck bmet in AM  DVT prophylaxis: Lovenox subq Code Status: Full Family Communication: Pt in room, family not at bedside  Status is: Inpatient  Remains inpatient appropriate because:IV treatments appropriate due to intensity of illness or inability to take PO and Inpatient level of care appropriate due to severity of illness   Dispo: The patient is from: Home              Anticipated d/c is to: Home              Anticipated d/c date is: 3 days              Patient currently is not medically stable to d/c.  Consultants:   Nephrology  Procedures:     Antimicrobials: Anti-infectives (From admission, onward)   Start     Dose/Rate Route Frequency Ordered Stop  01/13/21 0630  azithromycin (ZITHROMAX) 500 mg in sodium chloride 0.9 % 250 mL IVPB  Status:  Discontinued        500 mg 250 mL/hr over 60 Minutes Intravenous Every 24 hours 01/12/21 0715 01/13/21 1055   01/13/21 0600  cefTRIAXone (ROCEPHIN) 1 g in sodium chloride 0.9 % 100 mL IVPB  Status:  Discontinued        1 g 200 mL/hr over 30 Minutes Intravenous Every 24 hours 01/12/21 0715 01/13/21 1055   01/12/21 0530  cefTRIAXone (ROCEPHIN) 1 g in sodium  chloride 0.9 % 100 mL IVPB        1 g 200 mL/hr over 30 Minutes Intravenous  Once 01/12/21 0516 01/12/21 0627   01/12/21 0530  azithromycin (ZITHROMAX) 500 mg in sodium chloride 0.9 % 250 mL IVPB        500 mg 250 mL/hr over 60 Minutes Intravenous  Once 01/12/21 0516 01/12/21 8101      Subjective: Continuing to ambulate in hallway. Motivated to get better  Objective: Vitals:   01/14/21 2039 01/14/21 2325 01/15/21 0435 01/15/21 0734  BP: 140/70 (!) 155/78 (!) 153/82 (!) 170/84  Pulse: 65 76 65 64  Resp: 20 20 19 16   Temp: 98.2 F (36.8 C) 98.2 F (36.8 C) 98.2 F (36.8 C) 98.1 F (36.7 C)  TempSrc: Oral Oral Oral Oral  SpO2: 99% 99% 98% 98%  Weight:      Height:        Intake/Output Summary (Last 24 hours) at 01/15/2021 1420 Last data filed at 01/15/2021 1300 Gross per 24 hour  Intake 480 ml  Output 2300 ml  Net -1820 ml   Filed Weights   01/11/21 2211  Weight: 90.4 kg    Examination: General exam: Conversant, in no acute distress Respiratory system: normal chest rise, clear, no audible wheezing Cardiovascular system: regular rhythm, s1-s2 Gastrointestinal system: Nondistended, nontender, pos BS Central nervous system: No seizures, no tremors Extremities: No cyanosis, no joint deformities, BLE edema, improving Skin: No rashes, no pallor Psychiatry: Affect normal // no auditory hallucinations   Data Reviewed: I have personally reviewed following labs and imaging studies  CBC: Recent Labs  Lab 01/11/21 2156 01/13/21 0310  WBC 5.9 6.6  HGB 10.1* 10.3*  HCT 30.8* 32.9*  MCV 90.1 91.6  PLT 295 751   Basic Metabolic Panel: Recent Labs  Lab 01/11/21 2156 01/13/21 0310 01/14/21 0450 01/15/21 0518  NA 140 140  --  141  K 3.3* 3.5  --  3.6  CL 107 108  --  107  CO2 22 23  --  23  GLUCOSE 131* 172*  --  148*  BUN 36* 37*  --  37*  CREATININE 2.31* 2.28*  --  2.35*  CALCIUM 9.3 9.4  --  9.6  MG 1.4*  --  1.5* 1.4*   GFR: Estimated Creatinine  Clearance: 24.1 mL/min (A) (by C-G formula based on SCr of 2.35 mg/dL (H)). Liver Function Tests: Recent Labs  Lab 01/15/21 0518  AST 28  ALT 26  ALKPHOS 72  BILITOT 0.5  PROT 6.5  ALBUMIN 2.9*   No results for input(s): LIPASE, AMYLASE in the last 168 hours. No results for input(s): AMMONIA in the last 168 hours. Coagulation Profile: No results for input(s): INR, PROTIME in the last 168 hours. Cardiac Enzymes: No results for input(s): CKTOTAL, CKMB, CKMBINDEX, TROPONINI in the last 168 hours. BNP (last 3 results) No results for input(s): PROBNP in the last 8760 hours.  HbA1C: No results for input(s): HGBA1C in the last 72 hours. CBG: Recent Labs  Lab 01/14/21 1117 01/14/21 1653 01/14/21 2101 01/15/21 0731 01/15/21 1143  GLUCAP 192* 144* 207* 146* 154*   Lipid Profile: No results for input(s): CHOL, HDL, LDLCALC, TRIG, CHOLHDL, LDLDIRECT in the last 72 hours. Thyroid Function Tests: No results for input(s): TSH, T4TOTAL, FREET4, T3FREE, THYROIDAB in the last 72 hours. Anemia Panel: No results for input(s): VITAMINB12, FOLATE, FERRITIN, TIBC, IRON, RETICCTPCT in the last 72 hours. Sepsis Labs: No results for input(s): PROCALCITON, LATICACIDVEN in the last 168 hours.  Recent Results (from the past 240 hour(s))  SARS CORONAVIRUS 2 (TAT 6-24 HRS) Nasopharyngeal Nasopharyngeal Swab     Status: None   Collection Time: 01/12/21  3:50 AM   Specimen: Nasopharyngeal Swab  Result Value Ref Range Status   SARS Coronavirus 2 NEGATIVE NEGATIVE Final    Comment: (NOTE) SARS-CoV-2 target nucleic acids are NOT DETECTED.  The SARS-CoV-2 RNA is generally detectable in upper and lower respiratory specimens during the acute phase of infection. Negative results do not preclude SARS-CoV-2 infection, do not rule out co-infections with other pathogens, and should not be used as the sole basis for treatment or other patient management decisions. Negative results must be combined with  clinical observations, patient history, and epidemiological information. The expected result is Negative.  Fact Sheet for Patients: SugarRoll.be  Fact Sheet for Healthcare Providers: https://www.woods-mathews.com/  This test is not yet approved or cleared by the [place] FDA and  has been authorized for detection and/or diagnosis of SARS-CoV-2 by FDA under an Emergency Use Authorization (EUA). This EUA will remain  in effect (meaning this test can be used) for the duration of the COVID-19 declaration under Se ction 564(b)(1) of the Act, 21 U.S.C. section 360bbb-3(b)(1), unless the authorization is terminated or revoked sooner.  Performed at [facility], [place] [address], [place], [place] [address]   Culture, blood (Routine X 2) w Reflex to ID Panel     Status: None (Preliminary result)   Collection Time: 01/13/21  7:06 AM   Specimen: BLOOD  Result Value Ref Range Status   Specimen Description BLOOD LEFT [facility]  Final   Special Requests   Final    BOTTLES DRAWN AEROBIC AND ANAEROBIC Blood Culture adequate volume   Culture   Final    NO GROWTH 2 DAYS Performed at [facility], [address] [place], [place] [address]    Report Status PENDING  Incomplete  Culture, blood (Routine X 2) w Reflex to ID Panel     Status: None (Preliminary result)   Collection Time: 01/13/21  7:14 AM   Specimen: BLOOD  Result Value Ref Range Status   Specimen Description BLOOD RIGHT HAND  Final   Special Requests   Final    BOTTLES DRAWN AEROBIC AND ANAEROBIC Blood Culture results may not be optimal due to an excessive volume of blood received in culture bottles   Culture   Final    NO GROWTH 2 DAYS Performed at [facility], [address] [place], [place] [address]    Report Status PENDING  Incomplete     Radiology Studies: US RENAL  Result Date: 01/13/2021 CLINICAL DATA:  None EXAM: RENAL / URINARY TRACT ULTRASOUND  COMPLETE COMPARISON:  None. FINDINGS: Right Kidney: Renal measurements: 9.7 x 5 x 5.3 cm = volume: 135 mL. Cortex is echogenic. No hydronephrosis or mass Left Kidney: Renal measurements: 10.3 x 6 x 5.6 cm =  volume: 184 mL. Cortex is echogenic. No hydronephrosis or mass Bladder: Appears normal for degree of bladder distention. Other: None. IMPRESSION: Echogenic kidneys bilaterally consistent with medical renal disease. Negative for hydronephrosis Electronically Signed   By: Donavan Foil M.D.   On: 01/13/2021 20:28    Scheduled Meds: . allopurinol  300 mg Oral Daily  . amLODipine  10 mg Oral Daily  . aspirin EC  81 mg Oral Daily  . carvedilol  6.25 mg Oral BID  . enoxaparin (LOVENOX) injection  30 mg Subcutaneous Q24H  . furosemide  40 mg Intravenous BID  . insulin aspart  0-5 Units Subcutaneous QHS  . insulin aspart  0-9 Units Subcutaneous TID WC  . Ipratropium-Albuterol  2 puff Inhalation Q4H  . loratadine  10 mg Oral Daily  . pantoprazole  40 mg Oral Daily   Continuous Infusions:   LOS: 3 days   Marylu Lund, MD [facility] Pager On Amion  If 7PM-7AM, please contact night-coverage 01/15/2021, 2:20 PM

## 2021-01-16 LAB — ANA W/REFLEX IF POSITIVE: Anti Nuclear Antibody (ANA): NEGATIVE

## 2021-01-16 LAB — COMPREHENSIVE METABOLIC PANEL
ALT: 26 U/L (ref 0–44)
AST: 26 U/L (ref 15–41)
Albumin: 2.8 g/dL — ABNORMAL LOW (ref 3.5–5.0)
Alkaline Phosphatase: 73 U/L (ref 38–126)
Anion gap: 7 (ref 5–15)
BUN: 40 mg/dL — ABNORMAL HIGH (ref 6–20)
CO2: 24 mmol/L (ref 22–32)
Calcium: 9.4 mg/dL (ref 8.9–10.3)
Chloride: 109 mmol/L (ref 98–111)
Creatinine, Ser: 2.31 mg/dL — ABNORMAL HIGH (ref 0.44–1.00)
GFR, Estimated: 24 mL/min — ABNORMAL LOW (ref >60)
Glucose, Bld: 178 mg/dL — ABNORMAL HIGH (ref 70–99)
Potassium: 4 mmol/L (ref 3.5–5.1)
Sodium: 140 mmol/L (ref 135–145)
Total Bilirubin: 0.5 mg/dL (ref 0.3–1.2)
Total Protein: 6.5 g/dL (ref 6.5–8.1)

## 2021-01-16 LAB — GLUCOSE, CAPILLARY
Glucose-Capillary: 145 mg/dL — ABNORMAL HIGH (ref 70–99)
Glucose-Capillary: 229 mg/dL — ABNORMAL HIGH (ref 70–99)
Glucose-Capillary: 214 mg/dL — ABNORMAL HIGH (ref 70–99)
Glucose-Capillary: 262 mg/dL — ABNORMAL HIGH (ref 70–99)

## 2021-01-16 LAB — MPO/PR-3 (ANCA) ANTIBODIES
ANCA Proteinase 3: <3.5 U/mL (ref 0.0–3.5)
Myeloperoxidase Abs: <9 U/mL (ref 0.0–9.0)

## 2021-01-16 LAB — MAGNESIUM: Magnesium: 2.3 mg/dL (ref 1.7–2.4)

## 2021-01-16 LAB — KAPPA/LAMBDA LIGHT CHAINS
Kappa free light chain: 57.5 mg/L — ABNORMAL HIGH (ref 3.3–19.4)
Kappa, lambda light chain ratio: 2 — ABNORMAL HIGH (ref 0.26–1.65)
Lambda free light chains: 28.8 mg/L — ABNORMAL HIGH (ref 5.7–26.3)

## 2021-01-16 MED ORDER — FLUTICASONE PROPIONATE 50 MCG/ACT NA SUSP
1.0000 | Freq: Every day | NASAL | Status: DC
  Administered 2021-01-16 – 2021-01-18 (×3): 1 via NASAL
  Filled 2021-01-16: qty 16

## 2021-01-16 MED ORDER — FUROSEMIDE 10 MG/ML IJ SOLN
40.0000 mg | Freq: Every day | INTRAMUSCULAR | Status: DC
  Administered 2021-01-17 – 2021-01-18 (×2): 40 mg via INTRAVENOUS
  Filled 2021-01-16 (×2): qty 4

## 2021-01-16 NOTE — Progress Notes (Signed)
[facility]  ROUNDING NOTE   Subjective:   Renal function about the same. Creatinine 2.31 with an EGFR of 24.  01/16 0701 - 01/17 0700 In: 240 [P.O.:240] Out: 300 [Urine:300] Lab Results  Component Value Date   CREATININE 2.31 (H) 01/16/2021     Objective:  Vital signs in last 24 hours:  Temp:  [97.6 F (36.4 C)-98 F (36.7 C)] 98 F (36.7 C) (01/17 0754) Pulse Rate:  [63-77] 63 (01/17 0754) Resp:  [16-20] 18 (01/17 0754) BP: (143-166)/(70-88) 166/85 (01/17 0754) SpO2:  [94 %-100 %] 98 % (01/17 0754)  Weight change:  Filed Weights   01/11/21 2211  Weight: 90.4 kg    Intake/Output: I/O last 3 completed shifts: In: 480 [P.O.:480] Out: 700 [Urine:700]   Intake/Output this shift:  Total I/O In: 100 [P.O.:100] Out: -   Physical Exam: General: No acute distress  Head: Normocephalic, atraumatic. Moist oral mucosal membranes  Eyes: Anicteric  Lungs:  Diminished bilateral but clear  Heart: Regular rate and rhythm  Abdomen:  Soft, nontender, nondistended  Extremities: 1+ peripheral edema.  Neurologic: Awake, alert, speech clear and appropriate  Skin: No lesions or rashes    Basic Metabolic Panel: Recent Labs  Lab 01/11/21 2156 01/13/21 0310 01/14/21 0450 01/15/21 0518 01/16/21 0521  NA 140 140  --  141 140  K 3.3* 3.5  --  3.6 4.0  CL 107 108  --  107 109  CO2 22 23  --  23 24  GLUCOSE 131* 172*  --  148* 178*  BUN 36* 37*  --  37* 40*  CREATININE 2.31* 2.28*  --  2.35* 2.31*  CALCIUM 9.3 9.4  --  9.6 9.4  MG 1.4*  --  1.5* 1.4* 2.3    Liver Function Tests: Recent Labs  Lab 01/15/21 0518 01/16/21 0521  AST 28 26  ALT 26 26  ALKPHOS 72 73  BILITOT 0.5 0.5  PROT 6.5 6.5  ALBUMIN 2.9* 2.8*   No results for input(s): LIPASE, AMYLASE in the last 168 hours. No results for input(s): AMMONIA in the last 168 hours.  CBC: Recent Labs  Lab 01/11/21 2156 01/13/21 0310  WBC 5.9 6.6  HGB 10.1* 10.3*  HCT 30.8* 32.9*  MCV 90.1  91.6  PLT 295 341    Cardiac Enzymes: No results for input(s): CKTOTAL, CKMB, CKMBINDEX, TROPONINI in the last 168 hours.  BNP: Invalid input(s): POCBNP  CBG: Recent Labs  Lab 01/15/21 1143 01/15/21 1708 01/15/21 2027 01/16/21 0730 01/16/21 1154  GLUCAP 154* 230* 174* 145* 229*    Microbiology: Results for orders placed or performed during the hospital encounter of 01/12/21  SARS CORONAVIRUS 2 (TAT 6-24 HRS) Nasopharyngeal Nasopharyngeal Swab     Status: None   Collection Time: 01/12/21  3:50 AM   Specimen: Nasopharyngeal Swab  Result Value Ref Range Status   SARS Coronavirus 2 NEGATIVE NEGATIVE Final    Comment: (NOTE) SARS-CoV-2 target nucleic acids are NOT DETECTED.  The SARS-CoV-2 RNA is generally detectable in upper and lower respiratory specimens during the acute phase of infection. Negative results do not preclude SARS-CoV-2 infection, do not rule out co-infections with other pathogens, and should not be used as the sole basis for treatment or other patient management decisions. Negative results must be combined with clinical observations, patient history, and epidemiological information. The expected result is Negative.  Fact Sheet for Patients: SugarRoll.be  Fact Sheet for Healthcare Providers: https://www.woods-mathews.com/  This test is not yet approved or  cleared by the [place] and  has been authorized for detection and/or diagnosis of SARS-CoV-2 by FDA under an Emergency Use Authorization (EUA). This EUA will remain  in effect (meaning this test can be used) for the duration of the COVID-19 declaration under Se ction 564(b)(1) of the Act, 21 U.S.C. section 360bbb-3(b)(1), unless the authorization is terminated or revoked sooner.  Performed at [facility], [place] [address], [place], [place] [address]   Culture, blood (Routine X 2) w Reflex to ID Panel     Status: None (Preliminary result)    Collection Time: 01/13/21  7:06 AM   Specimen: BLOOD  Result Value Ref Range Status   Specimen Description BLOOD LEFT [facility]  Final   Special Requests   Final    BOTTLES DRAWN AEROBIC AND ANAEROBIC Blood Culture adequate volume   Culture   Final    NO GROWTH 3 DAYS Performed at [facility], [address], [place], [place] [address]    Report Status PENDING  Incomplete  Culture, blood (Routine X 2) w Reflex to ID Panel     Status: None (Preliminary result)   Collection Time: 01/13/21  7:14 AM   Specimen: BLOOD  Result Value Ref Range Status   Specimen Description BLOOD RIGHT HAND  Final   Special Requests   Final    BOTTLES DRAWN AEROBIC AND ANAEROBIC Blood Culture results may not be optimal due to an excessive volume of blood received in culture bottles   Culture   Final    NO GROWTH 3 DAYS Performed at [facility], [address] [place],  [address]    Report Status PENDING  Incomplete    Coagulation Studies: No results for input(s): LABPROT, INR in the last 72 hours.  Urinalysis: No results for input(s): COLORURINE, LABSPEC, PHURINE, GLUCOSEU, HGBUR, BILIRUBINUR, KETONESUR, PROTEINUR, UROBILINOGEN, NITRITE, LEUKOCYTESUR in the last 72 hours.  Invalid input(s): APPERANCEUR    Imaging: No results found.   Medications:    . allopurinol  300 mg Oral Daily  . amLODipine  10 mg Oral Daily  . aspirin EC  81 mg Oral Daily  . carvedilol  6.25 mg Oral BID  . enoxaparin (LOVENOX) injection  30 mg Subcutaneous Q24H  . fluticasone  1 spray Each Nare Daily  . furosemide  40 mg Intravenous BID  . insulin aspart  0-5 Units Subcutaneous QHS  . insulin aspart  0-9 Units Subcutaneous TID WC  . Ipratropium-Albuterol  2 puff Inhalation Q4H  . loratadine  10 mg Oral Daily  . pantoprazole  40 mg Oral Daily   acetaminophen, albuterol, dextromethorphan-guaiFENesin, hydrALAZINE, ondansetron (ZOFRAN) IV  Assessment/ Plan:  [name] is a 60  y.o.  female with hypertension, diabetes, asthma, GERD, gout, ventral hernia, CKD stage IV, obesity, was admitted on 01/12/2021 with Shortness of breath [R06.02] Bronchopneumonia [J18.0] Community acquired pneumonia of right middle lobe of lung [J18.9]   January 12, 2021.  2D echo.  LVEF 70 to 76%, grade 1 diastolic dysfunction  #CKD stage IV secondary to diabetes,arthrosclerosis,  and hypertension  Baseline creatinine of 2.29/GFR 23 on 02/14/2020 Lab Results  Component Value Date   CREATININE 2.31 (H) 01/16/2021   CREATININE 2.35 (H) 01/15/2021   CREATININE 2.28 (H) 01/13/2021  Renal ultrasound  IMPRESSION: Echogenic kidneys bilaterally consistent with medical renal disease. Negative for hydronephrosis  -Avoid ACE inhibitor/ARB -Avoid nonsteroidals -Avoid IV contrast exposure -Avoid hypotension  Renal function about the same.  Creatinine 2.31  with an EGFR 24.  #Lower extremity edema #Proteinuria Protein creatinine ratio 1.8 in 2021 Reduce Lasix to 40 mg IV daily.  # Diabetes type 2 with CKD Lab Results  Component Value Date   HGBA1C 7.2 (H) 02/09/2020  Patient is on insulin aspart  #Hypertension Blood pressure labile.  Continue amlodipine, carvedilol, furosemide.     LOS: 4 Any Mcneice 1/17/20223:58 PM

## 2021-01-16 NOTE — Progress Notes (Signed)
PROGRESS NOTE    Brianna [name]  [id number] DOB: 01/06/1961 DOA: 01/12/2021 PCP: Donnie Coffin, MD    Brief Narrative:  60 y.o. female with medical history significant of hypertension, diabetes mellitus, asthma, GERD, gout, ventral hernia, CKD stage IV, obesity, who presents with shortness breath.  Patient states that she has been having shortness of breath for more than 4 weeks, which is worsening the past 2 weeks, and progressively worsening.  Patient has dry cough, no chest pain, fever or chills. Pt has severe bilateral lower leg edema.  No history of CHF, DVT or PE. She is taking Lasix 40 mg prn. She has been vaccinated for COVID-19.States she has been using albuterol nebulizer treatments at home with minimal relief.   ED Course: pt was found to have WBC 5.9, BMP 16.8, pending COVID-19 PCR, potassium 3.3, renal function close to baseline, temperature normal, blood pressure 156/96, heart rate 88, RR 25, oxygen saturation 97% on room air.  Chest x-ray showed vascular congestion and cardiomegaly.  CT of the chest showed moderate cardiomegaly, small focus of infiltration in right middle lobe  Assessment & Plan:   Principal Problem:   Bronchopneumonia Active Problems:   Hypertension   Type II diabetes mellitus with renal manifestations (HCC)   Acid reflux   Asthma   CKD (chronic kidney disease), stage IV (HCC)   Hypokalemia   Possible bronchopneumonia ruled out:  -Patient has been having shortness of breath for more than 4 weeks, which has been worsening in the past 2 weeks.  No fever or leukocytosis.   -CT of the chest showed small focus of infiltrate in the right middle lobe indicating possible bronchopneumonia.   -Clinically not septic.   -Presented grossly volume overloaded with notable improvement after one dose of IV lasix at time of presentation, thus suspect sob related to volume overload and not PNA -Remains stable off further abx -Continue on IV lasix but  reduced to once daily per Nephrology -Nephrology continues to follow -Blood cx neg thus far -2d echo reviewed. Findings of EF 70-75%  Hypertension: -IV hydralazine as needed -Amlodipine, Coreg -Cont IV lasix  Type II diabetes mellitus with renal manifestations [facility]): Recent A1c 7.2, poorly controlled.  Patient is taking Humulin at home -Contniue with sliding scale insulin as pt needs  Acid reflux -Continue protonix  Asthma: -Bronchodilators as above  CKD (chronic kidney disease), stage IV ([place]): Baseline creatinine 2.2-2.8 recently.  Her creatinine is at 2.09, BUN 36, close to baseline. -Tolerating lasix thus far, dose decreased to once daily per Nephrology -Nephrology following. Of note, pt was referred to oupt Nephrology with first appointment originally scheduled for next week  Hypokalemia: K= 3.3 on admission. - Replaced -Recheck bmet in AM  DVT prophylaxis: Lovenox subq Code Status: Full Family Communication: Pt in room, family not at bedside  Status is: Inpatient  Remains inpatient appropriate because:IV treatments appropriate due to intensity of illness or inability to take PO and Inpatient level of care appropriate due to severity of illness   Dispo: The patient is from: Home              Anticipated d/c is to: Home              Anticipated d/c date is: 3 days              Patient currently is not medically stable to d/c.  Consultants:   Nephrology  Procedures:     Antimicrobials: Anti-infectives (From admission, onward)  Start     Dose/Rate Route Frequency Ordered Stop   01/13/21 0630  azithromycin (ZITHROMAX) 500 mg in sodium chloride 0.9 % 250 mL IVPB  Status:  Discontinued        500 mg 250 mL/hr over 60 Minutes Intravenous Every 24 hours 01/12/21 0715 01/13/21 1055   01/13/21 0600  cefTRIAXone (ROCEPHIN) 1 g in sodium chloride 0.9 % 100 mL IVPB  Status:  Discontinued        1 g 200 mL/hr over 30 Minutes Intravenous Every 24 hours 01/12/21  0715 01/13/21 1055   01/12/21 0530  cefTRIAXone (ROCEPHIN) 1 g in sodium chloride 0.9 % 100 mL IVPB        1 g 200 mL/hr over 30 Minutes Intravenous  Once 01/12/21 0516 01/12/21 0627   01/12/21 0530  azithromycin (ZITHROMAX) 500 mg in sodium chloride 0.9 % 250 mL IVPB        500 mg 250 mL/hr over 60 Minutes Intravenous  Once 01/12/21 0516 01/12/21 0921      Subjective: Eager to continue ambulating in the hallway  Objective: Vitals:   01/15/21 1942 01/15/21 2324 01/16/21 0434 01/16/21 0754  BP: (!) 156/81 (!) 143/70 (!) 166/88 (!) 166/85  Pulse: 77 76 73 63  Resp: '20 20 16 18  '$ Temp: 97.7 F (36.5 C) 97.7 F (36.5 C) 97.6 F (36.4 C) 98 F (36.7 C)  TempSrc: Oral Oral Oral Oral  SpO2: 97% 100% 94% 98%  Weight:      Height:        Intake/Output Summary (Last 24 hours) at 01/16/2021 1628 Last data filed at 01/16/2021 0830 Gross per 24 hour  Intake 100 ml  Output 300 ml  Net -200 ml   Filed Weights   01/11/21 2211  Weight: 90.4 kg    Examination: General exam: Awake, laying in bed, in nad Respiratory system: Normal respiratory effort, no wheezing Cardiovascular system: regular rate, s1, s2 Gastrointestinal system: Soft, nondistended, positive BS Central nervous system: CN2-12 grossly intact, strength intact Extremities: Perfused, no clubbing, LE edema improving Skin: Normal skin turgor, no notable skin lesions seen Psychiatry: Mood normal // no visual hallucinations   Data Reviewed: I have personally reviewed following labs and imaging studies  CBC: Recent Labs  Lab 01/11/21 2156 01/13/21 0310  WBC 5.9 6.6  HGB 10.1* 10.3*  HCT 30.8* 32.9*  MCV 90.1 91.6  PLT 295 [id number]   Basic Metabolic Panel: Recent Labs  Lab 01/11/21 2156 01/13/21 0310 01/14/21 0450 01/15/21 0518 01/16/21 0521  NA 140 140  --  141 140  K 3.3* 3.5  --  3.6 4.0  CL 107 108  --  107 109  CO2 22 23  --  23 24  GLUCOSE 131* 172*  --  148* 178*  BUN 36* 37*  --  37* 40*  CREATININE  2.31* 2.28*  --  2.35* 2.31*  CALCIUM 9.3 9.4  --  9.6 9.4  MG 1.4*  --  1.5* 1.4* 2.3   GFR: Estimated Creatinine Clearance: 24.5 mL/min (A) (by C-G formula based on SCr of 2.31 mg/dL (H)). Liver Function Tests: Recent Labs  Lab 01/15/21 0518 01/16/21 0521  AST 28 26  ALT 26 26  ALKPHOS 72 73  BILITOT 0.5 0.5  PROT 6.5 6.5  ALBUMIN 2.9* 2.8*   No results for input(s): LIPASE, AMYLASE in the last 168 hours. No results for input(s): AMMONIA in the last 168 hours. Coagulation Profile: No results for input(s): INR, PROTIME in  the last 168 hours. Cardiac Enzymes: No results for input(s): CKTOTAL, CKMB, CKMBINDEX, TROPONINI in the last 168 hours. BNP (last 3 results) No results for input(s): PROBNP in the last 8760 hours. HbA1C: No results for input(s): HGBA1C in the last 72 hours. CBG: Recent Labs  Lab 01/15/21 1143 01/15/21 1708 01/15/21 2027 01/16/21 0730 01/16/21 1154  GLUCAP 154* 230* 174* 145* 229*   Lipid Profile: No results for input(s): CHOL, HDL, LDLCALC, TRIG, CHOLHDL, LDLDIRECT in the last 72 hours. Thyroid Function Tests: No results for input(s): TSH, T4TOTAL, FREET4, T3FREE, THYROIDAB in the last 72 hours. Anemia Panel: No results for input(s): VITAMINB12, FOLATE, FERRITIN, TIBC, IRON, RETICCTPCT in the last 72 hours. Sepsis Labs: No results for input(s): PROCALCITON, LATICACIDVEN in the last 168 hours.  Recent Results (from the past 240 hour(s))  SARS CORONAVIRUS 2 (TAT 6-24 HRS) Nasopharyngeal Nasopharyngeal Swab     Status: None   Collection Time: 01/12/21  3:50 AM   Specimen: Nasopharyngeal Swab  Result Value Ref Range Status   SARS Coronavirus 2 NEGATIVE NEGATIVE Final    Comment: (NOTE) SARS-CoV-2 target nucleic acids are NOT DETECTED.  The SARS-CoV-2 RNA is generally detectable in upper and lower respiratory specimens during the acute phase of infection. Negative results do not preclude SARS-CoV-2 infection, do not rule out co-infections with  other pathogens, and should not be used as the sole basis for treatment or other patient management decisions. Negative results must be combined with clinical observations, patient history, and epidemiological information. The expected result is Negative.  Fact Sheet for Patients: SugarRoll.be  Fact Sheet for Healthcare Providers: https://www.woods-mathews.com/  This test is not yet approved or cleared by the [place] FDA and  has been authorized for detection and/or diagnosis of SARS-CoV-2 by FDA under an Emergency Use Authorization (EUA). This EUA will remain  in effect (meaning this test can be used) for the duration of the COVID-19 declaration under Se ction 564(b)(1) of the Act, 21 U.S.C. section 360bbb-3(b)(1), unless the authorization is terminated or revoked sooner.  Performed at [facility], [place] [address] [place], [place] [address]   Culture, blood (Routine X 2) w Reflex to ID Panel     Status: None (Preliminary result)   Collection Time: 01/13/21  7:06 AM   Specimen: BLOOD  Result Value Ref Range Status   Specimen Description BLOOD LEFT [facility]  Final   Special Requests   Final    BOTTLES DRAWN AEROBIC AND ANAEROBIC Blood Culture adequate volume   Culture   Final    NO GROWTH 3 DAYS Performed at [facility], [address], [place], [place] [address]    Report Status PENDING  Incomplete  Culture, blood (Routine X 2) w Reflex to ID Panel     Status: None (Preliminary result)   Collection Time: 01/13/21  7:14 AM   Specimen: BLOOD  Result Value Ref Range Status   Specimen Description BLOOD RIGHT HAND  Final   Special Requests   Final    BOTTLES DRAWN AEROBIC AND ANAEROBIC Blood Culture results may not be optimal due to an excessive volume of blood received in culture bottles   Culture   Final    NO GROWTH 3 DAYS Performed at [facility], [address], [place], [place] [address]    Report  Status PENDING  Incomplete     Radiology Studies: No results found.  Scheduled Meds: . allopurinol  300 mg Oral Daily  . amLODipine  10 mg  Oral Daily  . aspirin EC  81 mg Oral Daily  . carvedilol  6.25 mg Oral BID  . enoxaparin (LOVENOX) injection  30 mg Subcutaneous Q24H  . fluticasone  1 spray Each Nare Daily  . [START ON 01/17/2021] furosemide  40 mg Intravenous Daily  . insulin aspart  0-5 Units Subcutaneous QHS  . insulin aspart  0-9 Units Subcutaneous TID WC  . Ipratropium-Albuterol  2 puff Inhalation Q4H  . loratadine  10 mg Oral Daily  . pantoprazole  40 mg Oral Daily   Continuous Infusions:   LOS: 4 days   Marylu Lund, MD [facility] Pager On Amion  If 7PM-7AM, please contact night-coverage 01/16/2021, 4:28 PM

## 2021-01-17 LAB — COMPREHENSIVE METABOLIC PANEL
ALT: 25 U/L (ref 0–44)
AST: 22 U/L (ref 15–41)
Albumin: 2.8 g/dL — ABNORMAL LOW (ref 3.5–5.0)
Alkaline Phosphatase: 82 U/L (ref 38–126)
Anion gap: 7 (ref 5–15)
BUN: 41 mg/dL — ABNORMAL HIGH (ref 6–20)
CO2: 25 mmol/L (ref 22–32)
Calcium: 9.5 mg/dL (ref 8.9–10.3)
Chloride: 108 mmol/L (ref 98–111)
Creatinine, Ser: 2.33 mg/dL — ABNORMAL HIGH (ref 0.44–1.00)
GFR, Estimated: 24 mL/min — ABNORMAL LOW (ref >60)
Glucose, Bld: 222 mg/dL — ABNORMAL HIGH (ref 70–99)
Potassium: 4.1 mmol/L (ref 3.5–5.1)
Sodium: 140 mmol/L (ref 135–145)
Total Bilirubin: 0.5 mg/dL (ref 0.3–1.2)
Total Protein: 6.2 g/dL — ABNORMAL LOW (ref 6.5–8.1)

## 2021-01-17 LAB — LEGIONELLA PNEUMOPHILA SEROGP 1 UR AG: L. pneumophila Serogp 1 Ur Ag: NEGATIVE

## 2021-01-17 LAB — GLUCOSE, CAPILLARY
Glucose-Capillary: 180 mg/dL — ABNORMAL HIGH (ref 70–99)
Glucose-Capillary: 189 mg/dL — ABNORMAL HIGH (ref 70–99)
Glucose-Capillary: 332 mg/dL — ABNORMAL HIGH (ref 70–99)
Glucose-Capillary: 240 mg/dL — ABNORMAL HIGH (ref 70–99)

## 2021-01-17 MED ORDER — ZOLPIDEM TARTRATE 5 MG PO TABS
5.0000 mg | ORAL_TABLET | Freq: Every evening | ORAL | Status: DC | PRN
  Administered 2021-01-18: 5 mg via ORAL
  Filled 2021-01-17: qty 1

## 2021-01-17 NOTE — Progress Notes (Signed)
PROGRESS NOTE    Brianna [name]  [id number] DOB: 12-21-1961 DOA: 01/12/2021 PCP: Donnie Coffin, MD    Brief Narrative:  59 y.o. female with medical history significant of hypertension, diabetes mellitus, asthma, GERD, gout, ventral hernia, CKD stage IV, obesity, who presents with shortness breath.  Patient states that she has been having shortness of breath for more than 4 weeks, which is worsening the past 2 weeks, and progressively worsening.  Patient has dry cough, no chest pain, fever or chills. Pt has severe bilateral lower leg edema.  No history of CHF, DVT or PE. She is taking Lasix 40 mg prn. She has been vaccinated for COVID-19.States she has been using albuterol nebulizer treatments at home with minimal relief.   ED Course: pt was found to have WBC 5.9, BMP 16.8, pending COVID-19 PCR, potassium 3.3, renal function close to baseline, temperature normal, blood pressure 156/96, heart rate 88, RR 25, oxygen saturation 97% on room air.  Chest x-ray showed vascular congestion and cardiomegaly.  CT of the chest showed moderate cardiomegaly, small focus of infiltration in right middle lobe  Assessment & Plan:   Principal Problem:   Bronchopneumonia Active Problems:   Hypertension   Type II diabetes mellitus with renal manifestations (HCC)   Acid reflux   Asthma   CKD (chronic kidney disease), stage IV (HCC)   Hypokalemia   Possible bronchopneumonia ruled out:  -Patient has been having shortness of breath for more than 4 weeks, which has been worsening in the past 2 weeks.  No fever or leukocytosis.   -CT of the chest showed small focus of infiltrate in the right middle lobe indicating possible bronchopneumonia.  -Presented grossly volume overloaded -Continued on IV lasix but reduced to once daily per Nephrology. Continues with good urine output and improving edema -Nephrology continues to follow -Blood cx neg thus far -2d echo reviewed. Findings of EF  70-75%  Hypertension: -Continue IV hydralazine as needed -Continue with Amlodipine, Coreg -Cont IV lasix as per above  Type II diabetes mellitus with renal manifestations [facility]): Recent A1c 7.2, poorly controlled.  Patient is taking Humulin at home -Contniue with sliding scale insulin as pt needs  Acid reflux -Continue protonix  Asthma: -Bronchodilators as above  CKD (chronic kidney disease), stage IV ([place]): Baseline creatinine 2.2-2.8 recently.  Her creatinine is at 2.09, BUN 36, close to baseline. -Tolerating IV lasix thus far,per above -Nephrology following. Of note, pt was referred to oupt Nephrology with first appointment originally scheduled for next week  Hypokalemia: K= 3.3 on admission. - Replaced -Recheck bmet in AM  DVT prophylaxis: Lovenox subq Code Status: Full Family Communication: Pt in room, family not at bedside  Status is: Inpatient  Remains inpatient appropriate because:IV treatments appropriate due to intensity of illness or inability to take PO and Inpatient level of care appropriate due to severity of illness   Dispo: The patient is from: Home              Anticipated d/c is to: Home              Anticipated d/c date is: 3 days              Patient currently is not medically stable to d/c.  Consultants:   Nephrology  Procedures:     Antimicrobials: Anti-infectives (From admission, onward)   Start     Dose/Rate Route Frequency Ordered Stop   01/13/21 0630  azithromycin (ZITHROMAX) 500 mg in sodium chloride 0.9 %  250 mL IVPB  Status:  Discontinued        500 mg 250 mL/hr over 60 Minutes Intravenous Every 24 hours 01/12/21 0715 01/13/21 1055   01/13/21 0600  cefTRIAXone (ROCEPHIN) 1 g in sodium chloride 0.9 % 100 mL IVPB  Status:  Discontinued        1 g 200 mL/hr over 30 Minutes Intravenous Every 24 hours 01/12/21 0715 01/13/21 1055   01/12/21 0530  cefTRIAXone (ROCEPHIN) 1 g in sodium chloride 0.9 % 100 mL IVPB        1 g 200 mL/hr  over 30 Minutes Intravenous  Once 01/12/21 0516 01/12/21 0627   01/12/21 0530  azithromycin (ZITHROMAX) 500 mg in sodium chloride 0.9 % 250 mL IVPB        500 mg 250 mL/hr over 60 Minutes Intravenous  Once 01/12/21 0516 01/12/21 0921      Subjective: Reports feeling better today. Swelling improved. Continues to ambulate the nursing station routinely  Objective: Vitals:   01/16/21 2042 01/17/21 0016 01/17/21 0409 01/17/21 1234  BP: (!) 140/56 127/74 (!) 153/65 132/68  Pulse: 69 67 64 75  Resp: '20 18 20 16  '$ Temp: 98 F (36.7 C) 98.2 F (36.8 C) 98.2 F (36.8 C) 98.2 F (36.8 C)  TempSrc: Oral Oral Oral Oral  SpO2: 98% 98% 96% 94%  Weight:      Height:        Intake/Output Summary (Last 24 hours) at 01/17/2021 1548 Last data filed at 01/17/2021 0900 Gross per 24 hour  Intake 480 ml  Output 2000 ml  Net -1520 ml   Filed Weights   01/11/21 2211  Weight: 90.4 kg    Examination: General exam: Conversant, in no acute distress Respiratory system: normal chest rise, clear, no audible wheezing Cardiovascular system: regular rhythm, s1-s2 Gastrointestinal system: Nondistended, nontender, pos BS Central nervous system: No seizures, no tremors Extremities: No cyanosis, no joint deformities, BLE edema improving Skin: No rashes, no pallor Psychiatry: Affect normal // no auditory hallucinations   Data Reviewed: I have personally reviewed following labs and imaging studies  CBC: Recent Labs  Lab 01/11/21 2156 01/13/21 0310  WBC 5.9 6.6  HGB 10.1* 10.3*  HCT 30.8* 32.9*  MCV 90.1 91.6  PLT 295 [id number]   Basic Metabolic Panel: Recent Labs  Lab 01/11/21 2156 01/13/21 0310 01/14/21 0450 01/15/21 0518 01/16/21 0521 01/17/21 0418  NA 140 140  --  141 140 140  K 3.3* 3.5  --  3.6 4.0 4.1  CL 107 108  --  107 109 108  CO2 22 23  --  '23 24 25  '$ GLUCOSE 131* 172*  --  148* 178* 222*  BUN 36* 37*  --  37* 40* 41*  CREATININE 2.31* 2.28*  --  2.35* 2.31* 2.33*  CALCIUM 9.3  9.4  --  9.6 9.4 9.5  MG 1.4*  --  1.5* 1.4* 2.3  --    GFR: Estimated Creatinine Clearance: 24.3 mL/min (A) (by C-G formula based on SCr of 2.33 mg/dL (H)). Liver Function Tests: Recent Labs  Lab 01/15/21 0518 01/16/21 0521 01/17/21 0418  AST '28 26 22  '$ ALT '26 26 25  '$ ALKPHOS 72 73 82  BILITOT 0.5 0.5 0.5  PROT 6.5 6.5 6.2*  ALBUMIN 2.9* 2.8* 2.8*   No results for input(s): LIPASE, AMYLASE in the last 168 hours. No results for input(s): AMMONIA in the last 168 hours. Coagulation Profile: No results for input(s): INR, PROTIME in the last  168 hours. Cardiac Enzymes: No results for input(s): CKTOTAL, CKMB, CKMBINDEX, TROPONINI in the last 168 hours. BNP (last 3 results) No results for input(s): PROBNP in the last 8760 hours. HbA1C: No results for input(s): HGBA1C in the last 72 hours. CBG: Recent Labs  Lab 01/16/21 1154 01/16/21 1628 01/16/21 2050 01/17/21 0751 01/17/21 1231  GLUCAP 229* 214* 262* 180* 189*   Lipid Profile: No results for input(s): CHOL, HDL, LDLCALC, TRIG, CHOLHDL, LDLDIRECT in the last 72 hours. Thyroid Function Tests: No results for input(s): TSH, T4TOTAL, FREET4, T3FREE, THYROIDAB in the last 72 hours. Anemia Panel: No results for input(s): VITAMINB12, FOLATE, FERRITIN, TIBC, IRON, RETICCTPCT in the last 72 hours. Sepsis Labs: No results for input(s): PROCALCITON, LATICACIDVEN in the last 168 hours.  Recent Results (from the past 240 hour(s))  SARS CORONAVIRUS 2 (TAT 6-24 HRS) Nasopharyngeal Nasopharyngeal Swab     Status: None   Collection Time: 01/12/21  3:50 AM   Specimen: Nasopharyngeal Swab  Result Value Ref Range Status   SARS Coronavirus 2 NEGATIVE NEGATIVE Final    Comment: (NOTE) SARS-CoV-2 target nucleic acids are NOT DETECTED.  The SARS-CoV-2 RNA is generally detectable in upper and lower respiratory specimens during the acute phase of infection. Negative results do not preclude SARS-CoV-2 infection, do not rule out co-infections  with other pathogens, and should not be used as the sole basis for treatment or other patient management decisions. Negative results must be combined with clinical observations, patient history, and epidemiological information. The expected result is Negative.  Fact Sheet for Patients: SugarRoll.be  Fact Sheet for Healthcare Providers: https://www.woods-mathews.com/  This test is not yet approved or cleared by the [place] FDA and  has been authorized for detection and/or diagnosis of SARS-CoV-2 by FDA under an Emergency Use Authorization (EUA). This EUA will remain  in effect (meaning this test can be used) for the duration of the COVID-19 declaration under Se ction 564(b)(1) of the Act, 21 U.S.C. section 360bbb-3(b)(1), unless the authorization is terminated or revoked sooner.  Performed at [facility], [place] [address], [place], [place] [address]   Culture, blood (Routine X 2) w Reflex to ID Panel     Status: None (Preliminary result)   Collection Time: 01/13/21  7:06 AM   Specimen: BLOOD  Result Value Ref Range Status   Specimen Description BLOOD LEFT [facility]  Final   Special Requests   Final    BOTTLES DRAWN AEROBIC AND ANAEROBIC Blood Culture adequate volume   Culture   Final    NO GROWTH 4 DAYS Performed at [facility], [address] [place], [place] [address]    Report Status PENDING  Incomplete  Culture, blood (Routine X 2) w Reflex to ID Panel     Status: None (Preliminary result)   Collection Time: 01/13/21  7:14 AM   Specimen: BLOOD  Result Value Ref Range Status   Specimen Description BLOOD RIGHT HAND  Final   Special Requests   Final    BOTTLES DRAWN AEROBIC AND ANAEROBIC Blood Culture results may not be optimal due to an excessive volume of blood received in culture bottles   Culture   Final    NO GROWTH 4 DAYS Performed at [facility], [address] [place], [place] [address]    Report  Status PENDING  Incomplete     Radiology Studies: No results found.  Scheduled Meds:  allopurinol  300 mg Oral Daily   amLODipine  10 mg Oral Daily  aspirin EC  81 mg Oral Daily   carvedilol  6.25 mg Oral BID   enoxaparin (LOVENOX) injection  30 mg Subcutaneous Q24H   fluticasone  1 spray Each Nare Daily   furosemide  40 mg Intravenous Daily   insulin aspart  0-5 Units Subcutaneous QHS   insulin aspart  0-9 Units Subcutaneous TID WC   Ipratropium-Albuterol  2 puff Inhalation Q4H   loratadine  10 mg Oral Daily   pantoprazole  40 mg Oral Daily   Continuous Infusions:   LOS: 5 days   Marylu Lund, MD [facility] Pager On Amion  If 7PM-7AM, please contact night-coverage 01/17/2021, 3:48 PM

## 2021-01-17 NOTE — Progress Notes (Signed)
[facility]  ROUNDING NOTE   Subjective:   Overall patient states that respiratory status has improved significantly with diuresis. Renal function remained stable with a creatinine of 2.3 and EGFR 24.  01/17 0701 - 01/18 0700 In: 100 [P.O.:100] Out: 2000 [Urine:2000] Lab Results  Component Value Date   CREATININE 2.33 (H) 01/17/2021     Objective:  Vital signs in last 24 hours:  Temp:  [98 F (36.7 C)-98.2 F (36.8 C)] 98.2 F (36.8 C) (01/18 1234) Pulse Rate:  [64-75] 75 (01/18 1234) Resp:  [16-20] 16 (01/18 1234) BP: (127-153)/(56-74) 132/68 (01/18 1234) SpO2:  [94 %-98 %] 94 % (01/18 1234)  Weight change:  Filed Weights   01/11/21 2211  Weight: 90.4 kg    Intake/Output: I/O last 3 completed shifts: In: 100 [P.O.:100] Out: 2300 [Urine:2300]   Intake/Output this shift:  Total I/O In: 480 [P.O.:480] Out: -   Physical Exam: General: No acute distress  Head: Normocephalic, atraumatic. Moist oral mucosal membranes  Eyes: Anicteric  Lungs:  Diminished bilateral but clear  Heart: Regular rate and rhythm  Abdomen:  Soft, nontender, nondistended  Extremities: 1+ peripheral edema.  Neurologic: Awake, alert, speech clear and appropriate  Skin: No lesions or rashes    Basic Metabolic Panel: Recent Labs  Lab 01/11/21 2156 01/13/21 0310 01/14/21 0450 01/15/21 0518 01/16/21 0521 01/17/21 0418  NA 140 140  --  141 140 140  K 3.3* 3.5  --  3.6 4.0 4.1  CL 107 108  --  107 109 108  CO2 22 23  --  23 24 25   GLUCOSE 131* 172*  --  148* 178* 222*  BUN 36* 37*  --  37* 40* 41*  CREATININE 2.31* 2.28*  --  2.35* 2.31* 2.33*  CALCIUM 9.3 9.4  --  9.6 9.4 9.5  MG 1.4*  --  1.5* 1.4* 2.3  --     Liver Function Tests: Recent Labs  Lab 01/15/21 0518 01/16/21 0521 01/17/21 0418  AST 28 26 22   ALT 26 26 25   ALKPHOS 72 73 82  BILITOT 0.5 0.5 0.5  PROT 6.5 6.5 6.2*  ALBUMIN 2.9* 2.8* 2.8*   No results for input(s): LIPASE, AMYLASE in the last  168 hours. No results for input(s): AMMONIA in the last 168 hours.  CBC: Recent Labs  Lab 01/11/21 2156 01/13/21 0310  WBC 5.9 6.6  HGB 10.1* 10.3*  HCT 30.8* 32.9*  MCV 90.1 91.6  PLT 295 341    Cardiac Enzymes: No results for input(s): CKTOTAL, CKMB, CKMBINDEX, TROPONINI in the last 168 hours.  BNP: Invalid input(s): POCBNP  CBG: Recent Labs  Lab 01/16/21 1628 01/16/21 2050 01/17/21 0751 01/17/21 1231 01/17/21 1641  GLUCAP 214* 262* 180* 189* 332*    Microbiology: Results for orders placed or performed during the hospital encounter of 01/12/21  SARS CORONAVIRUS 2 (TAT 6-24 HRS) Nasopharyngeal Nasopharyngeal Swab     Status: None   Collection Time: 01/12/21  3:50 AM   Specimen: Nasopharyngeal Swab  Result Value Ref Range Status   SARS Coronavirus 2 NEGATIVE NEGATIVE Final    Comment: (NOTE) SARS-CoV-2 target nucleic acids are NOT DETECTED.  The SARS-CoV-2 RNA is generally detectable in upper and lower respiratory specimens during the acute phase of infection. Negative results do not preclude SARS-CoV-2 infection, do not rule out co-infections with other pathogens, and should not be used as the sole basis for treatment or other patient management decisions. Negative results must be combined with clinical  observations, patient history, and epidemiological information. The expected result is Negative.  Fact Sheet for Patients: SugarRoll.be  Fact Sheet for Healthcare Providers: https://www.woods-mathews.com/  This test is not yet approved or cleared by the [place] FDA and  has been authorized for detection and/or diagnosis of SARS-CoV-2 by FDA under an Emergency Use Authorization (EUA). This EUA will remain  in effect (meaning this test can be used) for the duration of the COVID-19 declaration under Se ction 564(b)(1) of the Act, 21 U.S.C. section 360bbb-3(b)(1), unless the authorization is terminated or revoked  sooner.  Performed at [facility], [place] [address], [place], [place] [address]   Culture, blood (Routine X 2) w Reflex to ID Panel     Status: None (Preliminary result)   Collection Time: 01/13/21  7:06 AM   Specimen: BLOOD  Result Value Ref Range Status   Specimen Description BLOOD LEFT [facility]  Final   Special Requests   Final    BOTTLES DRAWN AEROBIC AND ANAEROBIC Blood Culture adequate volume   Culture   Final    NO GROWTH 4 DAYS Performed at [facility], [address], [place], [place] [address]    Report Status PENDING  Incomplete  Culture, blood (Routine X 2) w Reflex to ID Panel     Status: None (Preliminary result)   Collection Time: 01/13/21  7:14 AM   Specimen: BLOOD  Result Value Ref Range Status   Specimen Description BLOOD RIGHT HAND  Final   Special Requests   Final    BOTTLES DRAWN AEROBIC AND ANAEROBIC Blood Culture results may not be optimal due to an excessive volume of blood received in culture bottles   Culture   Final    NO GROWTH 4 DAYS Performed at [facility], [address] [place], [place] [address]    Report Status PENDING  Incomplete    Coagulation Studies: No results for input(s): LABPROT, INR in the last 72 hours.  Urinalysis: No results for input(s): COLORURINE, LABSPEC, PHURINE, GLUCOSEU, HGBUR, BILIRUBINUR, KETONESUR, PROTEINUR, UROBILINOGEN, NITRITE, LEUKOCYTESUR in the last 72 hours.  Invalid input(s): APPERANCEUR    Imaging: No results found.   Medications:    . allopurinol  300 mg Oral Daily  . amLODipine  10 mg Oral Daily  . aspirin EC  81 mg Oral Daily  . carvedilol  6.25 mg Oral BID  . enoxaparin (LOVENOX) injection  30 mg Subcutaneous Q24H  . fluticasone  1 spray Each Nare Daily  . furosemide  40 mg Intravenous Daily  . insulin aspart  0-5 Units Subcutaneous QHS  . insulin aspart  0-9 Units Subcutaneous TID WC  . Ipratropium-Albuterol  2 puff Inhalation Q4H  . loratadine  10 mg Oral Daily  .  pantoprazole  40 mg Oral Daily   acetaminophen, albuterol, dextromethorphan-guaiFENesin, hydrALAZINE, ondansetron (ZOFRAN) IV, zolpidem  Assessment/ Plan:  Brianna [name] is a 60 y.o.  female with hypertension, diabetes, asthma, GERD, gout, ventral hernia, CKD stage IV, obesity, was admitted on 01/12/2021 with Shortness of breath [R06.02] Bronchopneumonia [J18.0] Community acquired pneumonia of right middle lobe of lung [J18.9]   January 12, 2021.  2D echo.  LVEF 70 to 77%, grade 1 diastolic dysfunction  #CKD stage IV secondary to diabetes,arthrosclerosis,  and hypertension  Baseline creatinine of 2.29/GFR 23 on 02/14/2020 Lab Results  Component Value Date   CREATININE 2.33 (H) 01/17/2021   CREATININE 2.31 (H) 01/16/2021   CREATININE 2.35 (H) 01/15/2021  Renal ultrasound  IMPRESSION: Echogenic kidneys bilaterally consistent with medical renal disease. Negative for hydronephrosis  Renal function remained stable on diuresis. Creatinine 2.3 with an EGFR 24. Will need outpatient monitoring of her renal function.  #Lower extremity edema #Proteinuria Protein creatinine ratio 1.8 in 2021 We will maintain the patient on Lasix 40 mg IV daily for 1 additional day. Transition the patient to Lasix p.o. daily upon discharge. 80 mg  # Diabetes type 2 with CKD Lab Results  Component Value Date   HGBA1C 7.2 (H) 02/09/2020  Glycemic control per hospitalist  #Hypertension Blood pressure well controlled today. Continue amlodipine, carvedilol, furosemide.     LOS: 5 Mateo Overbeck 1/18/20226:36 PM

## 2021-01-17 NOTE — Progress Notes (Signed)
Inpatient Diabetes Program Recommendations  AACE/ADA: New Consensus Statement on Inpatient Glycemic Control (2015)  Target Ranges:  Prepandial:   less than 140 mg/dL      Peak postprandial:   less than 180 mg/dL (1-2 hours)      Critically ill patients:  140 - 180 mg/dL   Lab Results  Component Value Date   GLUCAP 180 (H) 01/17/2021   HGBA1C 7.2 (H) 02/09/2020    Review of Glycemic Control Results for Brianna [name], Brianna [name] (MRN [id number]) as of 01/17/2021 12:35  Ref. Range 01/16/2021 07:30 01/16/2021 11:54 01/16/2021 16:28 01/16/2021 20:50 01/17/2021 07:51  Glucose-Capillary Latest Ref Range: 70 - 99 mg/dL 145 (H) 229 (H) 214 (H) 262 (H) 180 (H)   Diabetes history: DM2 Outpatient Diabetes medications:  Humulin N 20 units BID Current orders for Inpatient glycemic control:  Novolog 0-9 units TID and 0-5 QHS  Inpatient Diabetes Program Recommendations:    Novolog 3 units TID with meals if eats at least 50%   Will continue to follow while inpatient.  Thank you, Reche Dixon, RN, BSN Diabetes Coordinator Inpatient Diabetes Program [phone] (team pager from 8a-5p)

## 2021-01-18 DIAGNOSIS — E876 Hypokalemia: Secondary | ICD-10-CM

## 2021-01-18 DIAGNOSIS — R0600 Dyspnea, unspecified: Secondary | ICD-10-CM

## 2021-01-18 DIAGNOSIS — J4520 Mild intermittent asthma, uncomplicated: Secondary | ICD-10-CM

## 2021-01-18 DIAGNOSIS — E8770 Fluid overload, unspecified: Principal | ICD-10-CM

## 2021-01-18 DIAGNOSIS — R0609 Other forms of dyspnea: Secondary | ICD-10-CM | POA: Diagnosis present

## 2021-01-18 DIAGNOSIS — R600 Localized edema: Secondary | ICD-10-CM | POA: Diagnosis present

## 2021-01-18 LAB — PROTEIN ELECTROPHORESIS, SERUM
A/G Ratio: 0.9 (ref 0.7–1.7)
Albumin ELP: 2.8 g/dL — ABNORMAL LOW (ref 2.9–4.4)
Alpha-1-Globulin: 0.2 g/dL (ref 0.0–0.4)
Alpha-2-Globulin: 1.3 g/dL — ABNORMAL HIGH (ref 0.4–1.0)
Beta Globulin: 0.9 g/dL (ref 0.7–1.3)
Gamma Globulin: 0.7 g/dL (ref 0.4–1.8)
Globulin, Total: 3.1 g/dL (ref 2.2–3.9)
Total Protein ELP: 5.9 g/dL — ABNORMAL LOW (ref 6.0–8.5)

## 2021-01-18 LAB — GLUCOSE, CAPILLARY
Glucose-Capillary: 186 mg/dL — ABNORMAL HIGH (ref 70–99)
Glucose-Capillary: 236 mg/dL — ABNORMAL HIGH (ref 70–99)
Glucose-Capillary: 220 mg/dL — ABNORMAL HIGH (ref 70–99)
Glucose-Capillary: 231 mg/dL — ABNORMAL HIGH (ref 70–99)

## 2021-01-18 LAB — COMPREHENSIVE METABOLIC PANEL
ALT: 24 U/L (ref 0–44)
AST: 21 U/L (ref 15–41)
Albumin: 3 g/dL — ABNORMAL LOW (ref 3.5–5.0)
Alkaline Phosphatase: 89 U/L (ref 38–126)
Anion gap: 9 (ref 5–15)
BUN: 37 mg/dL — ABNORMAL HIGH (ref 6–20)
CO2: 26 mmol/L (ref 22–32)
Calcium: 9.5 mg/dL (ref 8.9–10.3)
Chloride: 104 mmol/L (ref 98–111)
Creatinine, Ser: 2.2 mg/dL — ABNORMAL HIGH (ref 0.44–1.00)
GFR, Estimated: 25 mL/min — ABNORMAL LOW (ref >60)
Glucose, Bld: 221 mg/dL — ABNORMAL HIGH (ref 70–99)
Potassium: 4.3 mmol/L (ref 3.5–5.1)
Sodium: 139 mmol/L (ref 135–145)
Total Bilirubin: 0.5 mg/dL (ref 0.3–1.2)
Total Protein: 6.8 g/dL (ref 6.5–8.1)

## 2021-01-18 LAB — CULTURE, BLOOD (ROUTINE X 2)
Culture: NO GROWTH
Culture: NO GROWTH
Special Requests: ADEQUATE

## 2021-01-18 LAB — ANCA TITERS
Atypical P-ANCA titer: 1:40 {titer} — ABNORMAL HIGH
C-ANCA: <1:20 {titer}
P-ANCA: <1:20 {titer}

## 2021-01-18 LAB — MAGNESIUM: Magnesium: 2 mg/dL (ref 1.7–2.4)

## 2021-01-18 MED ORDER — FUROSEMIDE 40 MG PO TABS
80.0000 mg | ORAL_TABLET | Freq: Every day | ORAL | Status: DC
  Administered 2021-01-19: 80 mg via ORAL
  Filled 2021-01-18: qty 2

## 2021-01-18 MED ORDER — INSULIN ASPART 100 UNIT/ML ~~LOC~~ SOLN
3.0000 [IU] | Freq: Three times a day (TID) | SUBCUTANEOUS | Status: DC
  Administered 2021-01-18 – 2021-01-19 (×3): 3 [IU] via SUBCUTANEOUS
  Filled 2021-01-18 (×2): qty 1

## 2021-01-18 NOTE — TOC Initial Note (Signed)
Transition of Care [facility]) - Initial/Assessment Note    Patient Details  Name: Brianna [name] MRN: [id number] Date of Birth: 08/18/1961  Transition of Care [facility]) CM/SW Contact:    Candie Chroman, LCSW Phone Number: 01/18/2021, 1:52 PM  Clinical Narrative: This CSW working remote today. Called patient in room, introduced role, and explained that PT recommendations would be discussed. Patient declined outpatient PT due to no insurance/inability to pay for it. CSW encouraged her to contact her PCP if she changes her mind. No further concerns. CSW encouraged patient to contact CSW as needed. CSW will continue to follow patient for support and facilitate return home when stable.                 Expected Discharge Plan: Home/Self Care Barriers to Discharge: Continued Medical Work up   Patient Goals and CMS Choice        Expected Discharge Plan and Services Expected Discharge Plan: Home/Self Care     Post Acute Care Choice: NA Living arrangements for the past 2 months: Single Family Home                                      Prior Living Arrangements/Services Living arrangements for the past 2 months: Single Family Home Lives with:: Relatives Patient language and need for interpreter reviewed:: Yes Do you feel safe going back to the place where you live?: Yes      Need for Family Participation in Patient Care: Yes (Comment) Care giver support system in place?: Yes (comment)   Criminal Activity/Legal Involvement Pertinent to Current Situation/Hospitalization: No - Comment as needed  Activities of Daily Living Home Assistive Devices/Equipment: None ADL Screening (condition at time of admission) Patient's cognitive ability adequate to safely complete daily activities?: Yes Is the patient deaf or have difficulty hearing?: No Does the patient have difficulty seeing, even when wearing glasses/contacts?: No Does the patient have difficulty concentrating, remembering, or  making decisions?: No Patient able to express need for assistance with ADLs?: Yes Does the patient have difficulty dressing or bathing?: No Independently performs ADLs?: Yes (appropriate for developmental age) Does the patient have difficulty walking or climbing stairs?: No Weakness of Legs: None Weakness of Arms/Hands: None  Permission Sought/Granted                  Emotional Assessment Appearance:: Appears stated age Attitude/Demeanor/Rapport: Engaged,Gracious Affect (typically observed): Appropriate,Calm,Pleasant Orientation: : Oriented to Self,Oriented to Place,Oriented to  Time,Oriented to Situation Alcohol / Substance Use: Not Applicable Psych Involvement: No (comment)  Admission diagnosis:  Shortness of breath [R06.02] Bronchopneumonia [J18.0] Community acquired pneumonia of right middle lobe of lung [J18.9] Patient Active Problem List   Diagnosis Date Noted  . Bronchopneumonia 01/12/2021  . Type II diabetes mellitus with renal manifestations ([place]) 01/12/2021  . Acid reflux   . Asthma   . CKD (chronic kidney disease), stage IV ([place])   . Hypokalemia   . Type 2 diabetes mellitus without complication, with long-term current use of insulin ([place])   . Acute renal failure with tubular necrosis ([place])   . Hypertension   . Strangulated ventral hernia 02/09/2020   PCP:  Donnie Coffin, MD Pharmacy:   [facility] [address] [place]) [place] [address] Phone: [phone] Fax: [phone]  [facility], [address], [place] [address] Phone: [phone] Fax: [phone]     Social Determinants of Health (SDOH) Interventions    Readmission Risk Interventions No flowsheet data found.

## 2021-01-18 NOTE — Progress Notes (Signed)
[facility]  ROUNDING NOTE   Subjective:   Renal function stable.  Not having active shortness of breath at the moment.  01/18 0701 - 01/19 0700 In: 480 [P.O.:480] Out: 3 [Urine:3] Lab Results  Component Value Date   CREATININE 2.20 (H) 01/18/2021     Objective:  Vital signs in last 24 hours:  Temp:  [97.5 F (36.4 C)-98.6 F (37 C)] 98.4 F (36.9 C) (01/19 1113) Pulse Rate:  [69-75] 70 (01/19 1113) Resp:  [15-19] 19 (01/19 1113) BP: (131-160)/(53-100) 153/83 (01/19 1113) SpO2:  [97 %-99 %] 97 % (01/19 1113)  Weight change:  Filed Weights   01/11/21 2211  Weight: 90.4 kg    Intake/Output: I/O last 3 completed shifts: In: 480 [P.O.:480] Out: 2003 [Urine:2003]   Intake/Output this shift:  Total I/O In: 360 [P.O.:360] Out: -   Physical Exam: General: No acute distress  Head: Normocephalic, atraumatic. Moist oral mucosal membranes  Eyes: Anicteric  Lungs:  Diminished bilateral but clear  Heart: Regular rate and rhythm  Abdomen:  Soft, nontender, nondistended  Extremities: 1+ peripheral edema.  Neurologic: Awake, alert, speech clear and appropriate  Skin: No lesions or rashes    Basic Metabolic Panel: Recent Labs  Lab 01/11/21 2156 01/13/21 0310 01/14/21 0450 01/15/21 0518 01/16/21 0521 01/17/21 0418 01/18/21 0528  NA 140 140  --  141 140 140 139  K 3.3* 3.5  --  3.6 4.0 4.1 4.3  CL 107 108  --  107 109 108 104  CO2 22 23  --  23 24 25 26   GLUCOSE 131* 172*  --  148* 178* 222* 221*  BUN 36* 37*  --  37* 40* 41* 37*  CREATININE 2.31* 2.28*  --  2.35* 2.31* 2.33* 2.20*  CALCIUM 9.3 9.4  --  9.6 9.4 9.5 9.5  MG 1.4*  --  1.5* 1.4* 2.3  --  2.0    Liver Function Tests: Recent Labs  Lab 01/15/21 0518 01/16/21 0521 01/17/21 0418 01/18/21 0528  AST 28 26 22 21   ALT 26 26 25 24   ALKPHOS 72 73 82 89  BILITOT 0.5 0.5 0.5 0.5  PROT 6.5 6.5 6.2* 6.8  ALBUMIN 2.9* 2.8* 2.8* 3.0*   No results for input(s): LIPASE, AMYLASE in the  last 168 hours. No results for input(s): AMMONIA in the last 168 hours.  CBC: Recent Labs  Lab 01/11/21 2156 01/13/21 0310  WBC 5.9 6.6  HGB 10.1* 10.3*  HCT 30.8* 32.9*  MCV 90.1 91.6  PLT 295 341    Cardiac Enzymes: No results for input(s): CKTOTAL, CKMB, CKMBINDEX, TROPONINI in the last 168 hours.  BNP: Invalid input(s): POCBNP  CBG: Recent Labs  Lab 01/17/21 1231 01/17/21 1641 01/17/21 2145 01/18/21 0738 01/18/21 1203  GLUCAP 189* 332* 240* 186* 236*    Microbiology: Results for orders placed or performed during the hospital encounter of 01/12/21  SARS CORONAVIRUS 2 (TAT 6-24 HRS) Nasopharyngeal Nasopharyngeal Swab     Status: None   Collection Time: 01/12/21  3:50 AM   Specimen: Nasopharyngeal Swab  Result Value Ref Range Status   SARS Coronavirus 2 NEGATIVE NEGATIVE Final    Comment: (NOTE) SARS-CoV-2 target nucleic acids are NOT DETECTED.  The SARS-CoV-2 RNA is generally detectable in upper and lower respiratory specimens during the acute phase of infection. Negative results do not preclude SARS-CoV-2 infection, do not rule out co-infections with other pathogens, and should not be used as the sole basis for treatment or other patient  management decisions. Negative results must be combined with clinical observations, patient history, and epidemiological information. The expected result is Negative.  Fact Sheet for Patients: SugarRoll.be  Fact Sheet for Healthcare Providers: https://www.woods-mathews.com/  This test is not yet approved or cleared by the [place] FDA and  has been authorized for detection and/or diagnosis of SARS-CoV-2 by FDA under an Emergency Use Authorization (EUA). This EUA will remain  in effect (meaning this test can be used) for the duration of the COVID-19 declaration under Se ction 564(b)(1) of the Act, 21 U.S.C. section 360bbb-3(b)(1), unless the authorization is terminated  or revoked sooner.  Performed at [facility], [place] [address] [place], [place] [address]   Culture, blood (Routine X 2) w Reflex to ID Panel     Status: None   Collection Time: 01/13/21  7:06 AM   Specimen: BLOOD  Result Value Ref Range Status   Specimen Description BLOOD LEFT [facility]  Final   Special Requests   Final    BOTTLES DRAWN AEROBIC AND ANAEROBIC Blood Culture adequate volume   Culture   Final    NO GROWTH 5 DAYS Performed at [facility], [place] [address] [place], [place] [address]    Report Status 01/18/2021 FINAL  Final  Culture, blood (Routine X 2) w Reflex to ID Panel     Status: None   Collection Time: 01/13/21  7:14 AM   Specimen: BLOOD  Result Value Ref Range Status   Specimen Description BLOOD RIGHT HAND  Final   Special Requests   Final    BOTTLES DRAWN AEROBIC AND ANAEROBIC Blood Culture results may not be optimal due to an excessive volume of blood received in culture bottles   Culture   Final    NO GROWTH 5 DAYS Performed at [facility], [address], [place], [place] [address]    Report Status 01/18/2021 FINAL  Final    Coagulation Studies: No results for input(s): LABPROT, INR in the last 72 hours.  Urinalysis: No results for input(s): COLORURINE, LABSPEC, PHURINE, GLUCOSEU, HGBUR, BILIRUBINUR, KETONESUR, PROTEINUR, UROBILINOGEN, NITRITE, LEUKOCYTESUR in the last 72 hours.  Invalid input(s): APPERANCEUR    Imaging: No results found.   Medications:    . allopurinol  300 mg Oral Daily  . amLODipine  10 mg Oral Daily  . aspirin EC  81 mg Oral Daily  . carvedilol  6.25 mg Oral BID  . enoxaparin (LOVENOX) injection  30 mg Subcutaneous Q24H  . fluticasone  1 spray Each Nare Daily  . furosemide  40 mg Intravenous Daily  . insulin aspart  0-5 Units Subcutaneous QHS  . insulin aspart  0-9 Units Subcutaneous TID WC  . insulin aspart  3 Units Subcutaneous TID WC  . Ipratropium-Albuterol  2 puff Inhalation Q4H  .  loratadine  10 mg Oral Daily  . pantoprazole  40 mg Oral Daily   acetaminophen, albuterol, dextromethorphan-guaiFENesin, hydrALAZINE, ondansetron (ZOFRAN) IV, zolpidem  Assessment/ Plan:  [name] is a 60 y.o.  female with hypertension, diabetes, asthma, GERD, gout, ventral hernia, CKD stage IV, obesity, was admitted on 01/12/2021 with Shortness of breath [R06.02] Bronchopneumonia [J18.0] Community acquired pneumonia of right middle lobe of lung [J18.9]   January 12, 2021.  2D echo.  LVEF 70 to 73%, grade 1 diastolic dysfunction  #CKD stage IV secondary to diabetes,arthrosclerosis,  and hypertension  Baseline creatinine of 2.29/GFR 23 on 02/14/2020 Lab Results  Component Value Date   CREATININE 2.20 (H)  01/18/2021   CREATININE 2.33 (H) 01/17/2021   CREATININE 2.31 (H) 01/16/2021  Renal ultrasound  IMPRESSION: Echogenic kidneys bilaterally consistent with medical renal disease. Negative for hydronephrosis  Renal function very slightly improved.  Creatinine down to 2.2 and EGFR 25.  She will need continued monitoring of her renal parameters as an outpatient.  #Lower extremity edema #Proteinuria Protein creatinine ratio 1.8 in 2021 Has been treated with IV Lasix.  We will DC this and transition the patient to p.o. Lasix 80 mg daily.  # Diabetes type 2 with CKD Lab Results  Component Value Date   HGBA1C 7.2 (H) 02/09/2020  Glycemic control per hospitalist  #Hypertension Maintain amlodipine, carvedilol, furosemide.   LOS: 6 Brianna [name] 1/19/202212:57 PM

## 2021-01-18 NOTE — Progress Notes (Signed)
Mobility Specialist - Progress Note   01/18/21 1542  Mobility  Activity Ambulated in room;Ambulated in hall  Level of Assistance Independent  Assistive Device None  Distance Ambulated (ft) 200 ft  Mobility Response Tolerated well  Mobility performed by Mobility specialist  $Mobility charge 1 Mobility    Pre-mobility: 66 HR, 94% SpO2 Post-mobility: 78 HR, 98% SpO2   Pt sitting on recliner w/ NT present in room upon arrival. Pt agreed to session. Pt independent t/o session. Pt ambulated 200' total in room and hallway w/o AD. No LOB noted. No c/o pain. Noted SOB at the end of session. Overall, pt tolerated session very well. Pt left sitting on recliner w/ all needs placed in reach.      Dearia Wilmouth Mobility Specialist  01/18/21, 3:44 PM

## 2021-01-18 NOTE — Progress Notes (Signed)
PROGRESS NOTE    Brianna [name]  [id number] DOB: [birth year] DOA: 01/12/2021 PCP: Donnie Coffin, MD    Chief Complaint  Patient presents with  . Shortness of Breath    Brief Narrative:  60 y.o.femalewith medical history significant ofhypertension, diabetes mellitus, asthma, GERD, gout, ventral hernia, CKD stage IV, obesity, who presents with shortness breath.  Patient states that she has been having shortness of breath for more than 4 weeks, which is worsening the past 2 weeks, and progressively worsening. Patient has dry cough, no chest pain, fever or chills. Pt hasseverebilateral lower leg edema. No history of CHF, DVT or PE. She istakingLasix 40 mg prn.She has been vaccinated for COVID-19.States she has been using albuterol nebulizer treatments at home with minimal relief.   ED Course:pt was found to have WBC 5.9, BMP 16.8, pending COVID-19 PCR, potassium 3.3, renal function close to baseline, temperature normal, blood pressure 156/96, heart rate 88, RR 25, oxygen saturation 97% on room air. Chest x-ray showed vascular congestion and cardiomegaly. CT of the chest showed moderate cardiomegaly, small focus of infiltration in right middle lobe    Assessment & Plan:   Principal Problem:   Dyspnea Active Problems:   Bilateral lower extremity edema   Volume overload   Hypertension   Bronchopneumonia   Type II diabetes mellitus with renal manifestations (HCC)   Acid reflux   Asthma   CKD (chronic kidney disease), stage IV (HCC)   Hypokalemia  1 dyspnea/lower lower extremity edema/volume overload Patient presented with dyspnea, lower extremity edema, chronic kidney disease stage IV Patient had presented with shortness of breath going on for weeks which has been worsening for 2 weeks prior to admission.  Patient afebrile with no leukocytosis.  Patient noted to be volume overloaded on examination.  Patient seen by nephrology and patient placed on IV Lasix  with clinical improvement and good diuresis.  Patient still with some lower extremity edema.  2D echo done with a EF of 70 to 75%.  Urine output not properly recorded however patient noted to be -4.9 L during this hospitalization.  Weight not obtained this morning.  Patient with some lower extremity edema.  Patient on IV Lasix daily.  Patient being followed by nephrology and being transitioned to Lasix 80 mg orally daily.  Will need outpatient follow-up with nephrology.  2.  Bronchopneumonia ruled out See problem #1.  3.  Hypertension Continue amlodipine, Coreg.  On IV Lasix and to be transitioned to oral Lasix 80 mg daily.  Follow.  4.  Gastroesophageal reflux disease PPI.  5.  Type 2 diabetes mellitus, with Renal manifestations Hemoglobin A1c 7.2.  On Humulin at home.  CBG of 186 this morning.  Stat NovoLog 3 units 3 times daily with meals.  Continue sliding scale insulin.  6.  Asthma Continue Combivent.  7.  Chronic kidney disease stage IV Baseline creatinine approximately 2.2-2.8.  Close to baseline.  On IV Lasix.  Nephrology following.  Outpatient follow-up with nephrology.  8.  Hypokalemia Potassium at 4.3.  Follow-up.   DVT prophylaxis: Lovenox Code Status: Full Family Communication: Updated patient.  No family at bedside. Disposition:   Status is: Inpatient    Dispo: The patient is from: Home              Anticipated d/c is to: Home              Anticipated d/c date is: 01/19/2021  Patient currently receiving IV diuretics, no stable for discharge.       Consultants:   Nephrology: Dr. Candiss Norse 01/13/2021  Procedures:   CT chest 01/12/2021  Chest x-ray 01/12/2021  Renal ultrasound 01/13/2021  2D echo 01/12/2021  Antimicrobials:  IV azithromycin 01/12/2021>>> 01/13/2021  IV Rocephin 1/13/ 2022x1 dose.   Subjective: Patient sitting up in chair, about to work with OT. Just finished working with PT. States SOB improved since admission. No CP.  States  nephrologist that she will receive a dose of IV Lasix today and subsequently transition to oral Lasix tomorrow.  Objective: Vitals:   01/17/21 1914 01/18/21 0825 01/18/21 1113 01/18/21 1532  BP: (!) 131/53 (!) 160/100 (!) 153/83 (!) 153/76  Pulse: 69 75 70 63  Resp: '16 15 19 18  '$ Temp: 98.6 F (37 C) (!) 97.5 F (36.4 C) 98.4 F (36.9 C) 98.1 F (36.7 C)  TempSrc: Oral Oral Oral Oral  SpO2: 97% 99% 97% 100%  Weight:      Height:        Intake/Output Summary (Last 24 hours) at 01/18/2021 1626 Last data filed at 01/18/2021 1350 Gross per 24 hour  Intake 840 ml  Output 3 ml  Net 837 ml   Filed Weights   01/11/21 2211  Weight: 90.4 kg    Examination:  General exam: NAD Respiratory system: Clear to auscultation. Respiratory effort normal. Cardiovascular system: S1 & S2 heard, RRR. No JVD, murmurs, rubs, gallops or clicks. No pedal edema. Gastrointestinal system: Abdomen is nondistended, soft and nontender. No organomegaly or masses felt. Normal bowel sounds heard. Central nervous system: Alert and oriented. No focal neurological deficits. Extremities: 2-3+ BLE edema R > L.  Skin: No rashes, lesions or ulcers Psychiatry: Judgement and insight appear normal. Mood & affect appropriate.     Data Reviewed: I have personally reviewed following labs and imaging studies  CBC: Recent Labs  Lab 01/11/21 2156 01/13/21 0310  WBC 5.9 6.6  HGB 10.1* 10.3*  HCT 30.8* 32.9*  MCV 90.1 91.6  PLT 295 [id number]    Basic Metabolic Panel: Recent Labs  Lab 01/11/21 2156 01/13/21 0310 01/14/21 0450 01/15/21 0518 01/16/21 0521 01/17/21 0418 01/18/21 0528  NA 140 140  --  141 140 140 139  K 3.3* 3.5  --  3.6 4.0 4.1 4.3  CL 107 108  --  107 109 108 104  CO2 22 23  --  '23 24 25 26  '$ GLUCOSE 131* 172*  --  148* 178* 222* 221*  BUN 36* 37*  --  37* 40* 41* 37*  CREATININE 2.31* 2.28*  --  2.35* 2.31* 2.33* 2.20*  CALCIUM 9.3 9.4  --  9.6 9.4 9.5 9.5  MG 1.4*  --  1.5* 1.4* 2.3  --   2.0    GFR: Estimated Creatinine Clearance: 25.8 mL/min (A) (by C-G formula based on SCr of 2.2 mg/dL (H)).  Liver Function Tests: Recent Labs  Lab 01/15/21 0518 01/16/21 0521 01/17/21 0418 01/18/21 0528  AST '28 26 22 21  '$ ALT '26 26 25 24  '$ ALKPHOS 72 73 82 89  BILITOT 0.5 0.5 0.5 0.5  PROT 6.5 6.5 6.2* 6.8  ALBUMIN 2.9* 2.8* 2.8* 3.0*    CBG: Recent Labs  Lab 01/17/21 1231 01/17/21 1641 01/17/21 2145 01/18/21 0738 01/18/21 1203  GLUCAP 189* 332* 240* 186* 236*     Recent Results (from the past 240 hour(s))  SARS CORONAVIRUS 2 (TAT 6-24 HRS) Nasopharyngeal Nasopharyngeal Swab  Status: None   Collection Time: 01/12/21  3:50 AM   Specimen: Nasopharyngeal Swab  Result Value Ref Range Status   SARS Coronavirus 2 NEGATIVE NEGATIVE Final    Comment: (NOTE) SARS-CoV-2 target nucleic acids are NOT DETECTED.  The SARS-CoV-2 RNA is generally detectable in upper and lower respiratory specimens during the acute phase of infection. Negative results do not preclude SARS-CoV-2 infection, do not rule out co-infections with other pathogens, and should not be used as the sole basis for treatment or other patient management decisions. Negative results must be combined with clinical observations, patient history, and epidemiological information. The expected result is Negative.  Fact Sheet for Patients: SugarRoll.be  Fact Sheet for Healthcare Providers: https://www.woods-mathews.com/  This test is not yet approved or cleared by the [place] FDA and  has been authorized for detection and/or diagnosis of SARS-CoV-2 by FDA under an Emergency Use Authorization (EUA). This EUA will remain  in effect (meaning this test can be used) for the duration of the COVID-19 declaration under Se ction 564(b)(1) of the Act, 21 U.S.C. section 360bbb-3(b)(1), unless the authorization is terminated or revoked sooner.  Performed at [facility], [place] [address], [place], [place] [address]   Culture, blood (Routine X 2) w Reflex to ID Panel     Status: None   Collection Time: 01/13/21  7:06 AM   Specimen: BLOOD  Result Value Ref Range Status   Specimen Description BLOOD LEFT [facility]  Final   Special Requests   Final    BOTTLES DRAWN AEROBIC AND ANAEROBIC Blood Culture adequate volume   Culture   Final    NO GROWTH 5 DAYS Performed at [facility], [address] [place], [place] [address]    Report Status 01/18/2021 FINAL  Final  Culture, blood (Routine X 2) w Reflex to ID Panel     Status: None   Collection Time: 01/13/21  7:14 AM   Specimen: BLOOD  Result Value Ref Range Status   Specimen Description BLOOD RIGHT HAND  Final   Special Requests   Final    BOTTLES DRAWN AEROBIC AND ANAEROBIC Blood Culture results may not be optimal due to an excessive volume of blood received in culture bottles   Culture   Final    NO GROWTH 5 DAYS Performed at [facility], [address] [place], [place] [address]    Report Status 01/18/2021 FINAL  Final         Radiology Studies: No results found.      Scheduled Meds: . allopurinol  300 mg Oral Daily  . amLODipine  10 mg Oral Daily  . aspirin EC  81 mg Oral Daily  . carvedilol  6.25 mg Oral BID  . enoxaparin (LOVENOX) injection  30 mg Subcutaneous Q24H  . fluticasone  1 spray Each Nare Daily  . furosemide  80 mg Oral Daily  . insulin aspart  0-5 Units Subcutaneous QHS  . insulin aspart  0-9 Units Subcutaneous TID WC  . insulin aspart  3 Units Subcutaneous TID WC  . Ipratropium-Albuterol  2 puff Inhalation Q4H  . loratadine  10 mg Oral Daily  . pantoprazole  40 mg Oral Daily   Continuous Infusions:   LOS: 6 days    Time spent: 35 minutes    Irine Seal, MD [facility]   To contact the attending provider between 7A-7P or the covering provider during after hours 7P-7A, please log into the web site [email or web address] and access  using universal [place] password for that web site. If you do not have the password, please call the hospital operator.  01/18/2021, 4:26 PM

## 2021-01-18 NOTE — Progress Notes (Signed)
Mobility Specialist - Progress Note   01/18/21 1700  Mobility  Range of Motion/Exercises All extremities (AP, SLR, ABD, DF, EF, AR, CR)  Level of Assistance Independent  Assistive Device None  Distance Ambulated (ft) 0 ft  Mobility Response Tolerated well  Mobility performed by Mobility specialist  $Mobility charge 1 Mobility    Pt was sitting in recliner upon arrival. Pt agreed to session. Pt denied pain, nausea, and fatigue. Pt was able to perform seated exercises: ankle pumps, straight leg raises, abduction, dorsiflexion of ankles, elbow flexion, arm raises, and crossover reach. Pt pleasantly motivated. Pt given handout of workouts to complete in between exercises. Overall, pt tolerated session well. Pt was left in recliner with all needs in reach.    Kathee Delton Mobility Specialist 01/18/21, 5:43 PM

## 2021-01-18 NOTE — Evaluation (Signed)
Physical Therapy Evaluation Patient Details Name: Brianna [name] MRN: [id number] DOB: Jun 20, 1961 Today's Date: 01/18/2021   History of Present Illness  Patient is a 60 year old female who presents to ED for shortness of breath for more than 4 weeks, worsening in past two weeks as well as severe bilateral LE edema. PMH includes HTN, DM, asthma, GERD, gout, ventral hernia, CKD stage IV, obesity. Patient does work at [employer].    Clinical Impression  Patient is a very pleasant 60 year old female who presents with generalized weakness. Prior to hospital admission, pt was independent without an AD working at [employer] and lives with her cousin in a one story home.  Patient is in bed upon PT arrival and agreeable to participate in therapy session. She is ind with transition to EOB and supervision for STS without need for AD. Ambulation is functional in regards to body  mechanics and gait speed however patient does report fatigue by end which is not her norm/baseline. Patient returned to EOB and performed seated strengthening interventions. She is given handout for seated and supine strengthening interventions.  Pt would benefit from skilled PT to address noted impairments and functional limitations (see below for any additional details).  Upon hospital discharge, pt would benefit from outpatient PT to return to PLOF.     Follow Up Recommendations Outpatient PT    Equipment Recommendations  None recommended by PT    Recommendations for Other Services       Precautions / Restrictions Precautions Precautions: None Restrictions Weight Bearing Restrictions: No      Mobility  Bed Mobility Overal bed mobility: Independent             General bed mobility comments: supine to sit with use of UE's no need for assistance or supervision    Transfers Overall transfer level: Modified independent Equipment used: None             General transfer comment: performs with supervision  from bed. No instability, able to adjust garments for proper fit in standing.  Ambulation/Gait Ambulation/Gait assistance: Supervision;Min guard Gait Distance (Feet): 180 Feet Assistive device: None Gait Pattern/deviations: Step-through pattern;Narrow base of support;Decreased stride length   Gait velocity interpretation: >2.62 ft/sec, indicative of community ambulatory General Gait Details: Patient ambulates with functional gait speed, smaller steps with narrow BOS  Stairs            Wheelchair Mobility    Modified Rankin (Stroke Patients Only)       Balance Overall balance assessment: Needs assistance Sitting-balance support: No upper extremity supported;Feet unsupported Sitting balance-Leahy Scale: Good Sitting balance - Comments: able to sit and reach, exercise LE's without LOB   Standing balance support: No upper extremity supported;During functional activity Standing balance-Leahy Scale: Fair Standing balance comment: Able to static and dynamic stand without UE support. Does report feeling weakness after ambulation             High level balance activites: Backward walking High Level Balance Comments: able to backwards step with CGA x 6 steps without LOB             Pertinent Vitals/Pain Pain Assessment: 0-10 Pain Score: 3  Pain Location: headache Pain Descriptors / Indicators: Aching Pain Intervention(s): Limited activity within patient's tolerance    Home Living Family/patient expects to be discharged to:: Private residence Living Arrangements: Other relatives (cousin) Available Help at Discharge: Family Type of Home: House Home Access: Stairs to enter Entrance Stairs-Rails: None Entrance Stairs-Number of Steps:  1 Home Layout: One level Home Equipment: Walker - 2 wheels Additional Comments: Patient has a walker at home but does not use it. Does not have any special equipment at home.    Prior Function Level of Independence: Independent          Comments: Patient is ind at baseline, works at [employer]        Extremity/Trunk Assessment   Upper Extremity Assessment Upper Extremity Assessment: Defer to OT evaluation    Lower Extremity Assessment Lower Extremity Assessment: RLE deficits/detail;LLE deficits/detail RLE Deficits / Details: grossly 4/5 RLE Coordination: WNL LLE Deficits / Details: grossly 4-/5 LLE Coordination: WNL       Communication   Communication: No difficulties  Cognition Arousal/Alertness: Awake/alert Behavior During Therapy: WFL for tasks assessed/performed Overall Cognitive Status: Within Functional Limits for tasks assessed                                 General Comments: Patient is A and O x 4, eager to get out of bed and move.      General Comments General comments (skin integrity, edema, etc.): patient appears well nourished and well groomed    Exercises General Exercises - Lower Extremity Ankle Circles/Pumps: Strengthening;Both;10 reps;Seated Long Arc Quad: Strengthening;5 reps;Seated Hip Flexion/Marching: Strengthening;5 reps;Seated Other Exercises Other Exercises: Patient educated on role of PT in acute care setting. Education and handout given on seated and supine strengthening interventions.   Assessment/Plan    PT Assessment Patient needs continued PT services  PT Problem List Decreased strength;Decreased activity tolerance;Decreased balance       PT Treatment Interventions Gait training;Stair training;Functional mobility training;Therapeutic activities;Patient/family education;Neuromuscular re-education;Balance training;Therapeutic exercise    PT Goals (Current goals can be found in the Care Plan section)  Acute Rehab PT Goals Patient Stated Goal: to get back to work PT Goal Formulation: With patient Time For Goal Achievement: 02/01/21 Potential to Achieve Goals: Good    Frequency Min 2X/week   Barriers to discharge   safe  to return home    Co-evaluation               AM-PAC PT "6 Clicks" Mobility  Outcome Measure Help needed turning from your back to your side while in a flat bed without using bedrails?: None Help needed moving from lying on your back to sitting on the side of a flat bed without using bedrails?: None Help needed moving to and from a bed to a chair (including a wheelchair)?: A Little Help needed standing up from a chair using your arms (e.g., wheelchair or bedside chair)?: None Help needed to walk in hospital room?: A Little Help needed climbing 3-5 steps with a railing? : A Little 6 Click Score: 21    End of Session Equipment Utilized During Treatment: Gait belt Activity Tolerance: Patient tolerated treatment well Patient left: in bed;with call bell/phone within reach Nurse Communication: Mobility status PT Visit Diagnosis: Unsteadiness on feet (R26.81);Other abnormalities of gait and mobility (R26.89);Muscle weakness (generalized) (M62.81)    Time: [id number] PT Time Calculation (min) (ACUTE ONLY): 23 min   Charges:   PT Evaluation $PT Eval Low Complexity: 1 Low PT Treatments $Therapeutic Exercise: 8-22 mins      Janna Arch, PT, DPT   01/18/2021, 11:24 AM

## 2021-01-18 NOTE — Evaluation (Addendum)
Occupational Therapy Evaluation Patient Details Name: Brianna [name] MRN: [id number] DOB: [birth year] Today's Date: 01/18/2021    History of Present Illness Patient is a 60 year old female who presents to ED for shortness of breath for more than 4 weeks, worsening in past two weeks as well as severe bilateral LE edema. PMH includes HTN, DM, asthma, GERD, gout, ventral hernia, CKD stage IV, obesity. Patient does work at [employer].   Clinical Impression   Pt. Was admitted to [facility] with SOB, and a bilateral LE edema.  Pt. Resides at home with family. Pt. Was independent with ADLs, and IADL functioning.Pt. works at [employer]. Pt. Does not drive. Pt. education was provided energy conservation, work simplification techniques, and A/E use for LE ADLs.  Pt. Was provided with a visual handout for energy conservation, and work simplification. Pt. is independent with skills, and moving within her room to the bathroom. No further OT services are warranted at this time. Will complete the OT orders.    Follow Up Recommendations  No OT follow up    Equipment Recommendations       Recommendations for Other Services       Precautions / Restrictions Precautions Precautions: None Restrictions Weight Bearing Restrictions: No      Mobility Bed Mobility Overal bed mobility: Independent             General bed mobility comments: Pt. up in chair upon arrival    Transfers Overall transfer level: Independent Equipment used: None             General transfer comment: performs with supervision from bed. No instability, able to adjust garments for proper fit in standing.    Balance                         ADL either performed or assessed with clinical judgement   ADL Overall ADL's : Needs assistance/impaired Eating/Feeding: Independent   Grooming: Independent   Upper Body Bathing: Independent;Set up       Upper Body Dressing : Set up;Independent   Lower Body  Dressing: Set up;Supervision/safety                       Vision Baseline Vision/History: No visual deficits Patient Visual Report: No change from baseline       Perception     Praxis      Pertinent Vitals/Pain Pain Assessment: No/denies pain Pain Score: 3  Pain Location: headache Pain Descriptors / Indicators: Aching Pain Intervention(s): Limited activity within patient's tolerance     Hand Dominance     Extremity/Trunk Assessment Upper Extremity Assessment Upper Extremity Assessment: Overall WFL for tasks assessed          Communication Communication Communication: No difficulties   Cognition Arousal/Alertness: Awake/alert Behavior During Therapy: WFL for tasks assessed/performed Overall Cognitive Status: Within Functional Limits for tasks assessed                                 General Comments: Patient is A and O x 4, eager to get out of bed and move.   General Comments  patient appears well nourished and well groomed    Exercises   Shoulder Instructions      Home Living Family/patient expects to be discharged to:: Private residence Living Arrangements: Other relatives Available Help at Discharge: Family Type of Home: House Home Access: Stairs  to enter Entrance Stairs-Number of Steps: 1 Entrance Stairs-Rails: None Home Layout: One level     Bathroom Shower/Tub: Tub/shower unit         Home Equipment: Walker - 2 wheels   Additional Comments: Patient has a walker at home but does not use it. Does not have any special equipment at home.      Prior Functioning/Environment Level of Independence: Independent        Comments: Patient is ind at baseline, works at [employer] Problem List:        OT Treatment/Interventions:      OT Goals(Current goals can be found in the care plan section) Acute Rehab OT Goals Patient Stated Goal: To return to work OT Goal Formulation: With patient Potential to Achieve  Goals: Good  OT Frequency:     Barriers to D/C:            Co-evaluation              AM-PAC OT "6 Clicks" Daily Activity     Outcome Measure Help from another person eating meals?: None Help from another person taking care of personal grooming?: None Help from another person toileting, which includes using toliet, bedpan, or urinal?: None Help from another person bathing (including washing, rinsing, drying)?: A Little Help from another person to put on and taking off regular upper body clothing?: None Help from another person to put on and taking off regular lower body clothing?: A Little 6 Click Score: 22   End of Session    Activity Tolerance: Patient tolerated treatment well Patient left: in bed  OT Visit Diagnosis: Muscle weakness (generalized) (M62.81)                Time: [id number] OT Time Calculation (min): 18 min. Charges:  OT General Charges $OT Visit: 1 Visit OT Evaluation $OT Eval Moderate Complexity: 1 Mod  Harrel Carina, MS, OTR/L   Harrel Carina 01/18/2021, 11:35 AM

## 2021-01-19 LAB — RENAL FUNCTION PANEL
Albumin: 2.8 g/dL — ABNORMAL LOW (ref 3.5–5.0)
Anion gap: 9 (ref 5–15)
BUN: 38 mg/dL — ABNORMAL HIGH (ref 6–20)
CO2: 25 mmol/L (ref 22–32)
Calcium: 9.7 mg/dL (ref 8.9–10.3)
Chloride: 107 mmol/L (ref 98–111)
Creatinine, Ser: 2.26 mg/dL — ABNORMAL HIGH (ref 0.44–1.00)
GFR, Estimated: 24 mL/min — ABNORMAL LOW (ref >60)
Glucose, Bld: 217 mg/dL — ABNORMAL HIGH (ref 70–99)
Phosphorus: 3.9 mg/dL (ref 2.5–4.6)
Potassium: 4.4 mmol/L (ref 3.5–5.1)
Sodium: 141 mmol/L (ref 135–145)

## 2021-01-19 LAB — GLUCOSE, CAPILLARY
Glucose-Capillary: 182 mg/dL — ABNORMAL HIGH (ref 70–99)
Glucose-Capillary: 208 mg/dL — ABNORMAL HIGH (ref 70–99)

## 2021-01-19 MED ORDER — INSULIN ASPART 100 UNIT/ML ~~LOC~~ SOLN
4.0000 [IU] | Freq: Three times a day (TID) | SUBCUTANEOUS | Status: DC
  Administered 2021-01-19: 4 [IU] via SUBCUTANEOUS
  Filled 2021-01-19: qty 1

## 2021-01-19 MED ORDER — FUROSEMIDE 80 MG PO TABS: 80.0000 mg | ORAL_TABLET | Freq: Every day | ORAL | 2 refills | Status: AC

## 2021-01-19 MED ORDER — ZOLPIDEM TARTRATE 5 MG PO TABS: 5.0000 mg | ORAL_TABLET | Freq: Every evening | ORAL | 0 refills | Status: DC | PRN

## 2021-01-19 NOTE — Progress Notes (Signed)
[facility]  ROUNDING NOTE   Subjective:   Overall patient feeling well.  Now transition to p.o. Lasix. Creatinine 2.26 with relatively stable EGFR of 24. Patient in good spirits.  01/19 0701 - 01/20 0700 In: 1080 [P.O.:1080] Out: 1200 [Urine:1200] Lab Results  Component Value Date   CREATININE 2.26 (H) 01/19/2021     Objective:  Vital signs in last 24 hours:  Temp:  [97.6 F (36.4 C)-98.5 F (36.9 C)] 98.2 F (36.8 C) (01/20 1113) Pulse Rate:  [62-83] 70 (01/20 1113) Resp:  [17-21] 17 (01/20 1113) BP: (143-165)/(72-88) 156/84 (01/20 1113) SpO2:  [95 %-100 %] 97 % (01/20 1113) Weight:  [100.7 kg] 100.7 kg (01/20 0415)  Weight change:  Filed Weights   01/11/21 2211 01/19/21 0415  Weight: 90.4 kg 100.7 kg    Intake/Output: I/O last 3 completed shifts: In: 1080 [P.O.:1080] Out: 1203 [BEEFE:0712]   Intake/Output this shift:  Total I/O In: 480 [P.O.:480] Out: 700 [Urine:700]  Physical Exam: General: No acute distress  Head: Normocephalic, atraumatic. Moist oral mucosal membranes  Eyes: Anicteric  Lungs:  Diminished bilateral but clear  Heart: Regular rate and rhythm  Abdomen:  Soft, nontender, nondistended  Extremities: Trace lower extremity edema  Neurologic: Awake, alert, speech clear and appropriate  Skin: No lesions or rashes    Basic Metabolic Panel: Recent Labs  Lab 01/14/21 0450 01/15/21 0518 01/16/21 0521 01/17/21 0418 01/18/21 0528 01/19/21 0433  NA  --  141 140 140 139 141  K  --  3.6 4.0 4.1 4.3 4.4  CL  --  107 109 108 104 107  CO2  --  _0 GLUCOSE  --  148* 178* 222* 221* 217*  BUN  --  37* 40* 41* 37* 38*  CREATININE  --  2.35* 2.31* 2.33* 2.20* 2.26*  CALCIUM  --  9.6 9.4 9.5 9.5 9.7  MG 1.5* 1.4* 2.3  --  2.0  --   PHOS  --   --   --   --   --  3.9    Liver Function Tests: Recent Labs  Lab 01/15/21 0518 01/16/21 0521 01/17/21 0418 01/18/21 0528 01/19/21 0433  AST _1 --   ALT _2 --   ALKPHOS 72 73 82 89  --   BILITOT 0.5 0.5 0.5 0.5  --   PROT 6.5 6.5 6.2* 6.8  --   ALBUMIN 2.9* 2.8* 2.8* 3.0* 2.8*   No results for input(s): LIPASE, AMYLASE in the last 168 hours. No results for input(s): AMMONIA in the last 168 hours.  CBC: Recent Labs  Lab 01/13/21 0310  WBC 6.6  HGB 10.3*  HCT 32.9*  MCV 91.6  PLT 341    Cardiac Enzymes: No results for input(s): CKTOTAL, CKMB, CKMBINDEX, TROPONINI in the last 168 hours.  BNP: Invalid input(s): POCBNP  CBG: Recent Labs  Lab 01/18/21 1203 01/18/21 1647 01/18/21 2146 01/19/21 0727 01/19/21 1141  GLUCAP 236* 220* 231* 182* 208*    Microbiology: Results for orders placed or performed during the hospital encounter of 01/12/21  SARS CORONAVIRUS 2 (TAT 6-24 HRS) Nasopharyngeal Nasopharyngeal Swab     Status: None   Collection Time: 01/12/21  3:50 AM   Specimen: Nasopharyngeal Swab  Result Value Ref Range Status   SARS Coronavirus 2 NEGATIVE NEGATIVE Final    Comment: (NOTE) SARS-CoV-2 target nucleic acids are NOT DETECTED.  The SARS-CoV-2 RNA is generally detectable in  upper and lower respiratory specimens during the acute phase of infection. Negative results do not preclude SARS-CoV-2 infection, do not rule out co-infections with other pathogens, and should not be used as the sole basis for treatment or other patient management decisions. Negative results must be combined with clinical observations, patient history, and epidemiological information. The expected result is Negative.  Fact Sheet for Patients: SugarRoll.be  Fact Sheet for Healthcare Providers: https://www.woods-mathews.com/  This test is not yet approved or cleared by the [place] FDA and  has been authorized for detection and/or diagnosis of SARS-CoV-2 by FDA under an Emergency Use Authorization (EUA). This EUA will remain  in effect (meaning this test can be used) for the duration of  the COVID-19 declaration under Se ction 564(b)(1) of the Act, 21 U.S.C. section 360bbb-3(b)(1), unless the authorization is terminated or revoked sooner.  Performed at [facility], [place] [address], [place], [place] [address]   Culture, blood (Routine X 2) w Reflex to ID Panel     Status: None   Collection Time: 01/13/21  7:06 AM   Specimen: BLOOD  Result Value Ref Range Status   Specimen Description BLOOD LEFT [facility]  Final   Special Requests   Final    BOTTLES DRAWN AEROBIC AND ANAEROBIC Blood Culture adequate volume   Culture   Final    NO GROWTH 5 DAYS Performed at [facility], [address] [place], [place] [address]    Report Status 01/18/2021 FINAL  Final  Culture, blood (Routine X 2) w Reflex to ID Panel     Status: None   Collection Time: 01/13/21  7:14 AM   Specimen: BLOOD  Result Value Ref Range Status   Specimen Description BLOOD RIGHT HAND  Final   Special Requests   Final    BOTTLES DRAWN AEROBIC AND ANAEROBIC Blood Culture results may not be optimal due to an excessive volume of blood received in culture bottles   Culture   Final    NO GROWTH 5 DAYS Performed at [facility], [address], [place], [place] [address]    Report Status 01/18/2021 FINAL  Final    Coagulation Studies: No results for input(s): LABPROT, INR in the last 72 hours.  Urinalysis: No results for input(s): COLORURINE, LABSPEC, PHURINE, GLUCOSEU, HGBUR, BILIRUBINUR, KETONESUR, PROTEINUR, UROBILINOGEN, NITRITE, LEUKOCYTESUR in the last 72 hours.  Invalid input(s): APPERANCEUR    Imaging: No results found.   Medications:    . allopurinol  300 mg Oral Daily  . amLODipine  10 mg Oral Daily  . aspirin EC  81 mg Oral Daily  . carvedilol  6.25 mg Oral BID  . enoxaparin (LOVENOX) injection  30 mg Subcutaneous Q24H  . fluticasone  1 spray Each Nare Daily  . furosemide  80 mg Oral Daily  . insulin aspart  0-5 Units Subcutaneous QHS  . insulin aspart  0-9 Units  Subcutaneous TID WC  . insulin aspart  4 Units Subcutaneous TID WC  . Ipratropium-Albuterol  2 puff Inhalation Q4H  . loratadine  10 mg Oral Daily  . pantoprazole  40 mg Oral Daily   acetaminophen, albuterol, dextromethorphan-guaiFENesin, hydrALAZINE, ondansetron (ZOFRAN) IV, zolpidem  Assessment/ Plan:  Brianna [name] is a 60 y.o.  female with hypertension, diabetes, asthma, GERD, gout, ventral hernia, CKD stage IV, obesity, was admitted on 01/12/2021 with Shortness of breath [R06.02] Bronchopneumonia [J18.0] Community acquired pneumonia of right middle lobe of lung [J18.9]   January 12, 2021.  2D echo.  LVEF 70 to 27%, grade 1 diastolic dysfunction  #CKD stage IV secondary to diabetes,arthrosclerosis,  and hypertension  Baseline creatinine of 2.29/GFR 23 on 02/14/2020 Lab Results  Component Value Date   CREATININE 2.26 (H) 01/19/2021   CREATININE 2.20 (H) 01/18/2021   CREATININE 2.33 (H) 01/17/2021  Renal ultrasound  IMPRESSION: Echogenic kidneys bilaterally consistent with medical renal disease. Negative for hydronephrosis  Overall renal function stable with diuresis.  Creatinine currently 2.26 with an EGFR 24.  She will need continued monitoring of renal function as an outpatient.  #Lower extremity edema #Proteinuria Protein creatinine ratio 1.8 in 2021 Continue furosemide 80 mg daily upon discharge.  # Diabetes type 2 with CKD Lab Results  Component Value Date   HGBA1C 7.2 (H) 02/09/2020  Glycemic control per hospitalist  #Hypertension Maintain amlodipine, carvedilol, furosemide.   LOS: 7 Jola Critzer 1/20/20224:46 PM

## 2021-01-19 NOTE — TOC Transition Note (Signed)
Transition of Care [facility]) - CM/SW Discharge Note   Patient Details  Name: Brianna [name] MRN: [id number] Date of Birth: [birth year]  [facility] CM/SW Contact:  Candie Chroman, LCSW Phone Number: 01/19/2021, 12:29 PM   Clinical Narrative: Patient has orders to discharge home today. She confirmed her primary pharmacy is [facility]. Lasix dosage was changed and new prescription for Ambien. Patient can get Lasix for $4 at [facility] without a coupon. Obtained coupon for Ambien to bring cost to $15.58. Patient confirmed she can afford this price. CSW printed and put on front of chart to go home with her in her discharge packet. Sent secure chat to RN to notify. No further concerns. CSW signing off.    Final next level of care: Home/Self Care Barriers to Discharge: Barriers Resolved   Patient Goals and CMS Choice        Discharge Placement                    Patient and family notified of of transfer: 01/19/21  Discharge Plan and Services     Post Acute Care Choice: NA                               Social Determinants of Health (SDOH) Interventions     Readmission Risk Interventions No flowsheet data found.

## 2021-01-19 NOTE — Progress Notes (Signed)
Brianna [name] to be D/C'd Home per MD order.  Discussed prescriptions and follow up appointments with the patient. Prescriptions given to patient, medication list explained in detail. Pt verbalized understanding.  Allergies as of 01/19/2021      Reactions   Ace Inhibitors Anaphylaxis   Lisinopril Anaphylaxis   Lipitor [atorvastatin] Other (See Comments)   Muscle aches      Medication List    STOP taking these medications   Cymbalta 60 MG capsule Generic drug: DULoxetine   hydrochlorothiazide 25 MG tablet Commonly known as: HYDRODIURIL     TAKE these medications   acetaminophen 500 MG tablet Commonly known as: TYLENOL Take 2 tablets (1,000 mg total) by mouth every 6 (six) hours as needed for mild pain.   albuterol (2.5 MG/3ML) 0.083% nebulizer solution Commonly known as: PROVENTIL Take 2.5 mg by nebulization every 4 (four) hours as needed for shortness of breath or wheezing.   allopurinol 300 MG tablet Commonly known as: ZYLOPRIM Take 300 mg by mouth daily.   amLODipine 10 MG tablet Commonly known as: NORVASC Take 10 mg by mouth daily.   Aspirin Adult Low Strength 81 MG EC tablet Generic drug: aspirin Take 81 mg by mouth daily.   carvedilol 6.25 MG tablet Commonly known as: COREG Take 6.25 mg by mouth 2 (two) times daily.   cetirizine 10 MG tablet Commonly known as: ZYRTEC Take 10 mg by mouth daily.   furosemide 80 MG tablet Commonly known as: LASIX Take 1 tablet (80 mg total) by mouth daily. For leg swelling or weight gain >5lbs What changed:   medication strength  how much to take  when to take this  reasons to take this   HumuLIN N 100 UNIT/ML injection Generic drug: insulin NPH Human Inject 0.2 mLs (20 Units total) into the skin 2 (two) times daily.   omeprazole 20 MG capsule Commonly known as: PRILOSEC Take 20 mg by mouth daily.   zolpidem 5 MG tablet Commonly known as: AMBIEN Take 1 tablet (5 mg total) by mouth at bedtime as needed for  sleep.       Vitals:   01/19/21 0848 01/19/21 1113  BP: (!) 148/77 (!) 156/84  Pulse: 76 70  Resp:  17  Temp: 98.2 F (36.8 C) 98.2 F (36.8 C)  SpO2: 100% 97%    Skin clean, dry and intact without evidence of skin break down, no evidence of skin tears noted. IV catheter discontinued intact. Site without signs and symptoms of complications. Dressing and pressure applied. Pt denies pain at this time. No complaints noted.  An After Visit Summary was printed and given to the patient. Patient escorted via [place], and D/C home via private auto.  Fuller Mandril, RN

## 2021-01-19 NOTE — Progress Notes (Signed)
Mobility Specialist - Progress Note   01/19/21 1042  Mobility  Activity Ambulated in room;Ambulated in hall  Level of Assistance Independent  Assistive Device None  Distance Ambulated (ft) 200 ft  Mobility Response Tolerated well  Mobility performed by Mobility specialist  $Mobility charge 1 Mobility    Pt sitting EOB upon arrival. Pt agreed to session. Pt independent t/o session. Pt ambulated 200' total in room and hallway w/o AD. No LOB noted. Pt denies pain and SOB. Overall, pt tolerated session very well. Pt left sitting EOB w/ all needs placed in reach.      Charlotte Brafford Mobility Specialist  01/19/21, 10:44 AM

## 2021-01-19 NOTE — Discharge Summary (Signed)
Physician Discharge Summary  Randie Sokolow [id number] DOB: 08/26/1961 DOA: 01/12/2021  PCP: Donnie Coffin, MD  Admit date: 01/12/2021 Discharge date: 01/19/2021  Time spent: 55 minutes  Recommendations for Outpatient Follow-up:  1. Follow-up with Dr. Holley Raring, nephrology in 2 to 3 weeks. 2. Follow-up with Aycock, Ngwe A, MD in 2 weeks.  On follow-up patient will need a basic metabolic profile done to follow-up on electrolytes and renal function.  Patient's diabetes will also need to be reassessed.   Discharge Diagnoses:  Principal Problem:   Dyspnea Active Problems:   Bilateral lower extremity edema   Volume overload   Hypertension   Bronchopneumonia   Type II diabetes mellitus with renal manifestations (HCC)   Acid reflux   Asthma   CKD (chronic kidney disease), stage IV (HCC)   Hypokalemia   Discharge Condition: Stable and improved  Diet recommendation: Carb modified diet  Filed Weights   01/11/21 2211 01/19/21 0415  Weight: 90.4 kg 100.7 kg    History of present illness:  HPI per Dr. Caryl Pina Brianna [name] is a 60 y.o. female with medical history significant of hypertension, diabetes mellitus, asthma, GERD, gout, ventral hernia, CKD stage IV, obesity, who presents with shortness breath.  Patient states that she has been having shortness of breath for more than 4 weeks, which is worsening the past 2 weeks, and progressively worsening.  Patient has dry cough, no chest pain, fever or chills. Pt has severe bilateral lower leg edema.  No history of CHF, DVT or PE. She is taking Lasix 40 mg prn. She has been vaccinated for COVID-19.States she has been using albuterol nebulizer treatments at home with minimal relief.   ED Course: pt was found to have WBC 5.9, BMP 16.8, pending COVID-19 PCR, potassium 3.3, renal function close to baseline, temperature normal, blood pressure 156/96, heart rate 88, RR 25, oxygen saturation 97% on room air.  Chest x-ray showed vascular  congestion and cardiomegaly.  CT of the chest showed moderate cardiomegaly, small focus of infiltration in right middle lobe. Pt is admitted by accepting MD to med-surg bed as inpt.  Hospital Course:  1 dyspnea/lower lower extremity edema/volume overload Patient presented with dyspnea, lower extremity edema, chronic kidney disease stage IV Patient had presented with shortness of breath going on for weeks which had been worsening for 2 weeks prior to admission.  Patient afebrile with no leukocytosis.  Patient noted to be volume overloaded on examination.  Patient seen by nephrology and patient placed on IV Lasix with clinical improvement and good diuresis.  Patient noted to be -6.323L during this hospitalization. Patient still with some lower extremity edema.  2D echo done with a EF of 70 to 75%.    Urine output and daily weights not accurately obtained daily during the hospitalization.  Patient improved clinically.  Patient subsequently transitioned from IV Lasix to oral Lasix 80 mg daily per nephrology recommendations.  By day of discharge patient was ambulating in the hallways without any significant shortness of breath.  Patient had no crackles on examination.  Lower extremity edema had improved.  Patient noted to have sats of 100% on room air by day of discharge.  Patient be discharged in stable and improved condition with outpatient follow-up with PCP and nephrology.   2.  Bronchopneumonia ruled out See problem #1.  3.  Hypertension Patient maintained on home regimen amlodipine and Coreg.  Patient was also on IV diuretics during the hospitalization.  HCTZ discontinued.  Patient be discharged  home on oral Lasix 80 mg daily.  HCTZ will be discontinued on discharge.  Outpatient follow-up with PCP.  4.  Gastroesophageal reflux disease Patient maintained on PPI.  5.  Type 2 diabetes mellitus, with Renal manifestations Hemoglobin A1c 7.2.  On Humulin at home.    Patient maintained on NovoLog meal  coverage insulin as well as sliding scale insulin during the hospitalization.  Humulin resumed on discharge.  Outpatient follow-up with PCP.  6.  Asthma Remained stable during the hospitalization.  Patient maintained on Combivent.   7.  Chronic kidney disease stage IV Baseline creatinine approximately 2.2-2.8.    Patient diuresed with Lasix during the hospitalization.  Renal function remained stable.  Patient was followed by nephrology.  Outpatient follow-up with nephrology.   8.  Hypokalemia Repleted.  Outpatient follow-up.    Procedures:  CT chest 01/12/2021  Chest x-ray 01/12/2021  Renal ultrasound 01/13/2021  2D echo 01/12/2021    Consultations:  Nephrology: Dr. Candiss Norse 01/13/2021    Discharge Exam: Vitals:   01/19/21 0848 01/19/21 1113  BP: (!) 148/77 (!) 156/84  Pulse: 76 70  Resp:  17  Temp: 98.2 F (36.8 C) 98.2 F (36.8 C)  SpO2: 100% 97%    General: NAD Cardiovascular: RRR, 1-2 + ble EDEMA Respiratory: Clear to auscultation bilaterally.  No wheezes, no crackles, no rhonchi.  Discharge Instructions   Discharge Instructions    Ambulatory referral to Physical Therapy   Complete by: As directed    Diet - low sodium heart healthy   Complete by: As directed    Increase activity slowly   Complete by: As directed      Allergies as of 01/19/2021      Reactions   Ace Inhibitors Anaphylaxis   Lisinopril Anaphylaxis   Lipitor [atorvastatin] Other (See Comments)   Muscle aches      Medication List    STOP taking these medications   Cymbalta 60 MG capsule Generic drug: DULoxetine   hydrochlorothiazide 25 MG tablet Commonly known as: HYDRODIURIL     TAKE these medications   acetaminophen 500 MG tablet Commonly known as: TYLENOL Take 2 tablets (1,000 mg total) by mouth every 6 (six) hours as needed for mild pain.   albuterol (2.5 MG/3ML) 0.083% nebulizer solution Commonly known as: PROVENTIL Take 2.5 mg by nebulization every 4 (four) hours as  needed for shortness of breath or wheezing.   allopurinol 300 MG tablet Commonly known as: ZYLOPRIM Take 300 mg by mouth daily.   amLODipine 10 MG tablet Commonly known as: NORVASC Take 10 mg by mouth daily.   Aspirin Adult Low Strength 81 MG EC tablet Generic drug: aspirin Take 81 mg by mouth daily.   carvedilol 6.25 MG tablet Commonly known as: COREG Take 6.25 mg by mouth 2 (two) times daily.   cetirizine 10 MG tablet Commonly known as: ZYRTEC Take 10 mg by mouth daily.   furosemide 80 MG tablet Commonly known as: LASIX Take 1 tablet (80 mg total) by mouth daily. For leg swelling or weight gain >5lbs What changed:   medication strength  how much to take  when to take this  reasons to take this   HumuLIN N 100 UNIT/ML injection Generic drug: insulin NPH Human Inject 0.2 mLs (20 Units total) into the skin 2 (two) times daily.   omeprazole 20 MG capsule Commonly known as: PRILOSEC Take 20 mg by mouth daily.   zolpidem 5 MG tablet Commonly known as: AMBIEN Take 1 tablet (5 mg  total) by mouth at bedtime as needed for sleep.      Allergies  Allergen Reactions  . Ace Inhibitors Anaphylaxis  . Lisinopril Anaphylaxis  . Lipitor [Atorvastatin] Other (See Comments)    Muscle aches    Follow-up Information    [facility] Follow up on 02/02/2021.   Specialty: Cardiology Why: at 11:30am. Enter through the [place] entrance Contact information: [place] [address]       Donnie Coffin, MD. Schedule an appointment as soon as possible for a visit in 2 week(s).   Specialty: Family Medicine Contact information: [place] [address] [phone]        Anthonette Legato, MD. Schedule an appointment as soon as possible for a visit in 2 week(s).   Specialty: Nephrology Why: f/u in 2-3 weeks. Contact information: [place] [address] [phone]                The results of significant diagnostics from this hospitalization (including imaging, microbiology, ancillary and laboratory) are listed below for reference.    Significant Diagnostic Studies: DG Chest 2 View  Result Date: 01/12/2021 CLINICAL DATA:  Worsening shortness of breath for 2 weeks EXAM: CHEST - 2 VIEW COMPARISON:  Radiograph 02/09/2020 FINDINGS: Mild pulmonary vascular congestion within enlarged, lobular cardiac silhouette. The aorta is calcified. The remaining cardiomediastinal contours are unremarkable. Low volumes and atelectatic changes are present with vascular crowding. More bandlike opacity in the left mid lung, likely atelectatic or scarring. No other focal consolidative process. No pneumothorax or visible effusion. No acute osseous or soft tissue abnormality. Degenerative changes are present in the imaged spine and shoulders. IMPRESSION: 1. Pulmonary vascular congestion and enlarged cardiac silhouette, could reflect cardiomegaly or pericardial effusion given lobular cardiac contours. 2. Low volumes and atelectasis. Electronically Signed   By: Lovena Le M.D.   On: 01/12/2021 01:57   CT Chest Wo Contrast  Result Date: 01/12/2021 CLINICAL DATA:  60 year old female with increasing shortness of breath for 2 weeks. Lower extremity edema. EXAM: CT CHEST WITHOUT CONTRAST TECHNIQUE: Multidetector CT imaging of the chest was performed following the standard protocol without IV contrast. COMPARISON:  Chest radiographs earlier today, 02/09/2020. CT Abdomen and Pelvis 11/09/2008. FINDINGS: Cardiovascular: Moderate cardiomegaly is new since 2009. Small volume pericardial effusion also present, but most pronounced at the cardiac base, proximal great vessels. Calcified aortic atherosclerosis. Calcified coronary artery atherosclerosis on series 2, image 62. Vascular patency is not evaluated in the absence of IV contrast.  Mediastinum/Nodes: No lymphadenopathy. A small gastric hiatal hernia is stable since 2009. Lungs/Pleura: Mild respiratory motion. Major airways are patent. Mild perihilar and retrocardiac atelectasis, primarily in the lingula and right middle lobe. No pleural effusion. No pulmonary edema, but there is a solitary small focus of nonspecific ground-glass opacity in the right mid lung (coronal image 78) near the minor fissure. Upper Abdomen: Chronic hepatomegaly, but otherwise negative visible noncontrast liver. No splenomegaly. Incidental splenule. (Normal variant). Negative visible noncontrast adrenal glands, left kidney, and other bowel in the upper abdomen. Musculoskeletal: Motion artifact most affecting the ribs. No acute osseous abnormality identified. IMPRESSION: 1. Moderate cardiomegaly is new since 2009 and there is a small or trace pericardial effusion. Calcified coronary artery and Aortic Atherosclerosis (ICD10-I70.0). 2. Solitary small focus of ground-glass opacity in the right middle lobe such that mild or developing Bronchopneumonia is not excluded. But there  is no generalized pulmonary edema, no pleural effusion. There is mild atelectasis. 3. Chronic hepatomegaly. Small gastric hiatal hernia. Electronically Signed   By: Genevie Ann M.D.   On: 01/12/2021 04:51   US RENAL  Result Date: 01/13/2021 CLINICAL DATA:  None EXAM: RENAL / URINARY TRACT ULTRASOUND COMPLETE COMPARISON:  None. FINDINGS: Right Kidney: Renal measurements: 9.7 x 5 x 5.3 cm = volume: 135 mL. Cortex is echogenic. No hydronephrosis or mass Left Kidney: Renal measurements: 10.3 x 6 x 5.6 cm = volume: 184 mL. Cortex is echogenic. No hydronephrosis or mass Bladder: Appears normal for degree of bladder distention. Other: None. IMPRESSION: Echogenic kidneys bilaterally consistent with medical renal disease. Negative for hydronephrosis Electronically Signed   By: Donavan Foil M.D.   On: 01/13/2021 20:28   ECHOCARDIOGRAM COMPLETE  Result  Date: 01/12/2021    ECHOCARDIOGRAM REPORT   Patient Name:   Brianna [name] Date of Exam: 01/12/2021 Medical Rec #:  [id number]           Height:       57.0 in Accession #:    [id number]          Weight:       199.3 lb Date of Birth:  21-Jun-1961           BSA:          1.796 m Patient Age:    60 years            BP:           164/60 mmHg Patient Gender: F                   HR:           84 bpm. Exam Location:  [facility] Procedure: 2D Echo, Cardiac Doppler and Color Doppler Indications:     CHF-acute diastolic [id number]  History:         Patient has no prior history of Echocardiogram examinations.                  Risk Factors:Hypertension and Diabetes.  Sonographer:     Sherrie Sport RDCS (AE) Referring Phys:  [id number] Soledad Gerlach NIU Diagnosing Phys: Bartholome Bill MD IMPRESSIONS  1. Left ventricular ejection fraction, by estimation, is 70 to 75%. The left ventricle has hyperdynamic function. The left ventricle has no regional wall motion abnormalities. There is mild left ventricular hypertrophy. Left ventricular diastolic parameters are consistent with Grade I diastolic dysfunction (impaired relaxation).  2. Right ventricular systolic function is normal. The right ventricular size is normal.  3. The mitral valve is grossly normal. Trivial mitral valve regurgitation.  4. The aortic valve is grossly normal. Aortic valve regurgitation is trivial. No aortic stenosis is present. FINDINGS  Left Ventricle: Left ventricular ejection fraction, by estimation, is 70 to 75%. The left ventricle has hyperdynamic function. The left ventricle has no regional wall motion abnormalities. The left ventricular internal cavity size was normal in size. There is mild left ventricular hypertrophy. Left ventricular diastolic parameters are consistent with Grade I diastolic dysfunction (impaired relaxation). Right Ventricle: The right ventricular size is normal. No increase in right ventricular wall thickness. Right ventricular systolic function is normal. Left  Atrium: Left atrial size was normal in size. Right Atrium: Right atrial size was normal in size. Pericardium: There is no evidence of pericardial effusion. Mitral Valve: The mitral valve is grossly normal. Trivial mitral valve regurgitation. Tricuspid Valve: The tricuspid valve is not well visualized. Tricuspid  valve regurgitation is trivial. Aortic Valve: The aortic valve is grossly normal. Aortic valve regurgitation is trivial. No aortic stenosis is present. Aortic valve mean gradient measures 4.0 mmHg. Aortic valve peak gradient measures 6.4 mmHg. Aortic valve area, by VTI measures 2.15 cm. Pulmonic Valve: The pulmonic valve was not assessed. Pulmonic valve regurgitation is not visualized. Aorta: The aortic root is normal in size and structure. IAS/Shunts: The interatrial septum was not well visualized.  LEFT VENTRICLE PLAX 2D LVIDd:         5.00 cm  Diastology LVIDs:         3.01 cm  LV e' medial:    5.55 cm/s LV PW:         1.29 cm  LV E/e' medial:  12.0 LV IVS:        1.04 cm  LV e' lateral:   5.22 cm/s LVOT diam:     2.00 cm  LV E/e' lateral: 12.8 LV SV:         52 LV SV Index:   29 LVOT Area:     3.14 cm  RIGHT VENTRICLE RV Basal diam:  3.68 cm RV S prime:     13.90 cm/s TAPSE (M-mode): 4.5 cm LEFT ATRIUM             Index       RIGHT ATRIUM           Index LA diam:        3.10 cm 1.73 cm/m  RA Area:     15.80 cm LA Vol (A2C):   53.2 ml 29.62 ml/m RA Volume:   39.00 ml  21.72 ml/m LA Vol (A4C):   63.7 ml 35.47 ml/m LA Biplane Vol: 64.5 ml 35.91 ml/m  AORTIC VALVE                   PULMONIC VALVE AV Area (Vmax):    2.20 cm    PV Vmax:        1.00 m/s AV Area (Vmean):   2.03 cm    PV Peak grad:   4.0 mmHg AV Area (VTI):     2.15 cm    RVOT Peak grad: 7 mmHg AV Vmax:           126.00 cm/s AV Vmean:          92.500 cm/s AV VTI:            0.244 m AV Peak Grad:      6.4 mmHg AV Mean Grad:      4.0 mmHg LVOT Vmax:         88.10 cm/s LVOT Vmean:        59.900 cm/s LVOT VTI:          0.167 m LVOT/AV VTI  ratio: 0.68  AORTA Ao Root diam: 3.30 cm MITRAL VALVE               TRICUSPID VALVE MV Area (PHT): 3.56 cm    TR Peak grad:   11.3 mmHg MV Decel Time: 213 msec    TR Vmax:        168.00 cm/s MV E velocity: 66.80 cm/s MV A velocity: 76.30 cm/s  SHUNTS MV E/A ratio:  0.88        Systemic VTI:  0.17 m                            Systemic Diam:  2.00 cm Bartholome Bill MD Electronically signed by Bartholome Bill MD Signature Date/Time: 01/12/2021/4:38:08 PM    Final     Microbiology: Recent Results (from the past 240 hour(s))  SARS CORONAVIRUS 2 (TAT 6-24 HRS) Nasopharyngeal Nasopharyngeal Swab     Status: None   Collection Time: 01/12/21  3:50 AM   Specimen: Nasopharyngeal Swab  Result Value Ref Range Status   SARS Coronavirus 2 NEGATIVE NEGATIVE Final    Comment: (NOTE) SARS-CoV-2 target nucleic acids are NOT DETECTED.  The SARS-CoV-2 RNA is generally detectable in upper and lower respiratory specimens during the acute phase of infection. Negative results do not preclude SARS-CoV-2 infection, do not rule out co-infections with other pathogens, and should not be used as the sole basis for treatment or other patient management decisions. Negative results must be combined with clinical observations, patient history, and epidemiological information. The expected result is Negative.  Fact Sheet for Patients: SugarRoll.be  Fact Sheet for Healthcare Providers: https://www.woods-mathews.com/  This test is not yet approved or cleared by the [place] FDA and  has been authorized for detection and/or diagnosis of SARS-CoV-2 by FDA under an Emergency Use Authorization (EUA). This EUA will remain  in effect (meaning this test can be used) for the duration of the COVID-19 declaration under Se ction 564(b)(1) of the Act, 21 U.S.C. section 360bbb-3(b)(1), unless the authorization is terminated or revoked sooner.  Performed at [facility], [place] [address], [place], [place] [address]   Culture, blood (Routine X 2) w Reflex to ID Panel     Status: None   Collection Time: 01/13/21  7:06 AM   Specimen: BLOOD  Result Value Ref Range Status   Specimen Description BLOOD LEFT [facility]  Final   Special Requests   Final    BOTTLES DRAWN AEROBIC AND ANAEROBIC Blood Culture adequate volume   Culture   Final    NO GROWTH 5 DAYS Performed at [facility], [address] [place], [place] [address]    Report Status 01/18/2021 FINAL  Final  Culture, blood (Routine X 2) w Reflex to ID Panel     Status: None   Collection Time: 01/13/21  7:14 AM   Specimen: BLOOD  Result Value Ref Range Status   Specimen Description BLOOD RIGHT HAND  Final   Special Requests   Final    BOTTLES DRAWN AEROBIC AND ANAEROBIC Blood Culture results may not be optimal due to an excessive volume of blood received in culture bottles   Culture   Final    NO GROWTH 5 DAYS Performed at [facility], [address] [place], [place] [address]    Report Status 01/18/2021 FINAL  Final     Labs: Basic Metabolic Panel: Recent Labs  Lab 01/14/21 0450 01/15/21 0518 01/16/21 0521 01/17/21 0418 01/18/21 0528 01/19/21 0433  NA  --  141 140 140 139 141  K  --  3.6 4.0 4.1 4.3 4.4  CL  --  107 109 108 104 107  CO2  --  '23 24 25 26 25  '$ GLUCOSE  --  148* 178* 222* 221* 217*  BUN  --  37* 40* 41* 37* 38*  CREATININE  --  2.35* 2.31* 2.33* 2.20* 2.26*  CALCIUM  --  9.6 9.4 9.5 9.5 9.7  MG 1.5* 1.4* 2.3  --  2.0  --   PHOS  --   --   --   --   --  3.9   Liver  Function Tests: Recent Labs  Lab 01/15/21 0518 01/16/21 0521 01/17/21 0418 01/18/21 0528 01/19/21 0433  AST '28 26 22 21  '$ --   ALT '26 26 25 24  '$ --   ALKPHOS 72 73 82 89  --   BILITOT 0.5 0.5 0.5 0.5  --   PROT 6.5 6.5 6.2* 6.8  --   ALBUMIN 2.9* 2.8* 2.8* 3.0* 2.8*   No results for input(s): LIPASE, AMYLASE in the last 168 hours. No results for input(s): AMMONIA in the last 168 hours. CBC: Recent  Labs  Lab 01/13/21 0310  WBC 6.6  HGB 10.3*  HCT 32.9*  MCV 91.6  PLT 341   Cardiac Enzymes: No results for input(s): CKTOTAL, CKMB, CKMBINDEX, TROPONINI in the last 168 hours. BNP: BNP (last 3 results) Recent Labs    01/11/21 2156  BNP 16.8    ProBNP (last 3 results) No results for input(s): PROBNP in the last 8760 hours.  CBG: Recent Labs  Lab 01/18/21 1203 01/18/21 1647 01/18/21 2146 01/19/21 0727 01/19/21 1141  GLUCAP 236* 220* 231* 182* 208*       Signed:  Irine Seal MD.  [facility] 01/19/2021, 12:28 PM

## 2021-01-19 NOTE — Plan of Care (Signed)
  Problem: Health Behavior/Discharge Planning: Goal: Ability to manage health-related needs will improve Outcome: Progressing   Problem: Clinical Measurements: Goal: Will remain free from infection Outcome: Progressing Goal: Diagnostic test results will improve Outcome: Progressing Goal: Respiratory complications will improve Outcome: Progressing   Problem: Nutrition: Goal: Adequate nutrition will be maintained Outcome: Progressing   Problem: Elimination: Goal: Will not experience complications related to bowel motility Outcome: Progressing   Problem: Safety: Goal: Ability to remain free from injury will improve Outcome: Progressing

## 2021-02-01 NOTE — Progress Notes (Deleted)
   Patient ID: Brianna [name], female    DOB: [birth year], 60 y.o.   MRN: [id number]  HPI  Brianna [name] is a 60 y/o female with a history of  Echo report from 01/12/21 reviewed and showed an EF of 70-75% along with mild LVH.  Admitted 01/12/21 due to shortness of breath/ volume overload. Nephrology consult obtained. Initially placed on IV lasix with transition to oral diuretics. Hypokalemia corrected. Discharged after 7 days.   She presents today for her initial visit with a chief complaint of   Review of Systems    Physical Exam    Assessment & Plan:  1: Chronic heart failure with preserved ejection fraction- - NYHA class - BNP 01/11/21 was 16.8  2: HTN- - BP - BMP 01/19/21 reviewed and showed sodium 141, potassium 4.4, creatinine 2.26 and GFR 24  3: DM with CKD- - A1c 02/09/20 was 7.2%

## 2021-02-02 ENCOUNTER — Telehealth: Payer: Self-pay | Admitting: Family

## 2021-02-02 ENCOUNTER — Ambulatory Visit: Payer: Self-pay | Admitting: Family

## 2021-02-02 NOTE — Telephone Encounter (Signed)
Patient did not show for his [facility] Failure [facility] appointment on 02/02/21. Will attempt to reschedule.

## 2021-02-06 ENCOUNTER — Telehealth: Payer: Self-pay | Admitting: Family

## 2021-02-06 NOTE — Telephone Encounter (Signed)
LVM in attempt to reschedule a new patient appointment that was missed for the CHF Clinic.   Aniaya Bacha, NT

## 2021-06-26 IMAGING — DX DG CHEST 1V PORT
1 series · 1 of 1 positions shown · non-contrast
Comparison: 04/22/2013

CLINICAL DATA: Shortness of breath, weakness and constipation for
the past 4 days. Nausea and vomiting. Abdominal hernia which is firm
to palpation.

EXAM:
PORTABLE CHEST 1 VIEW

[chest ap]
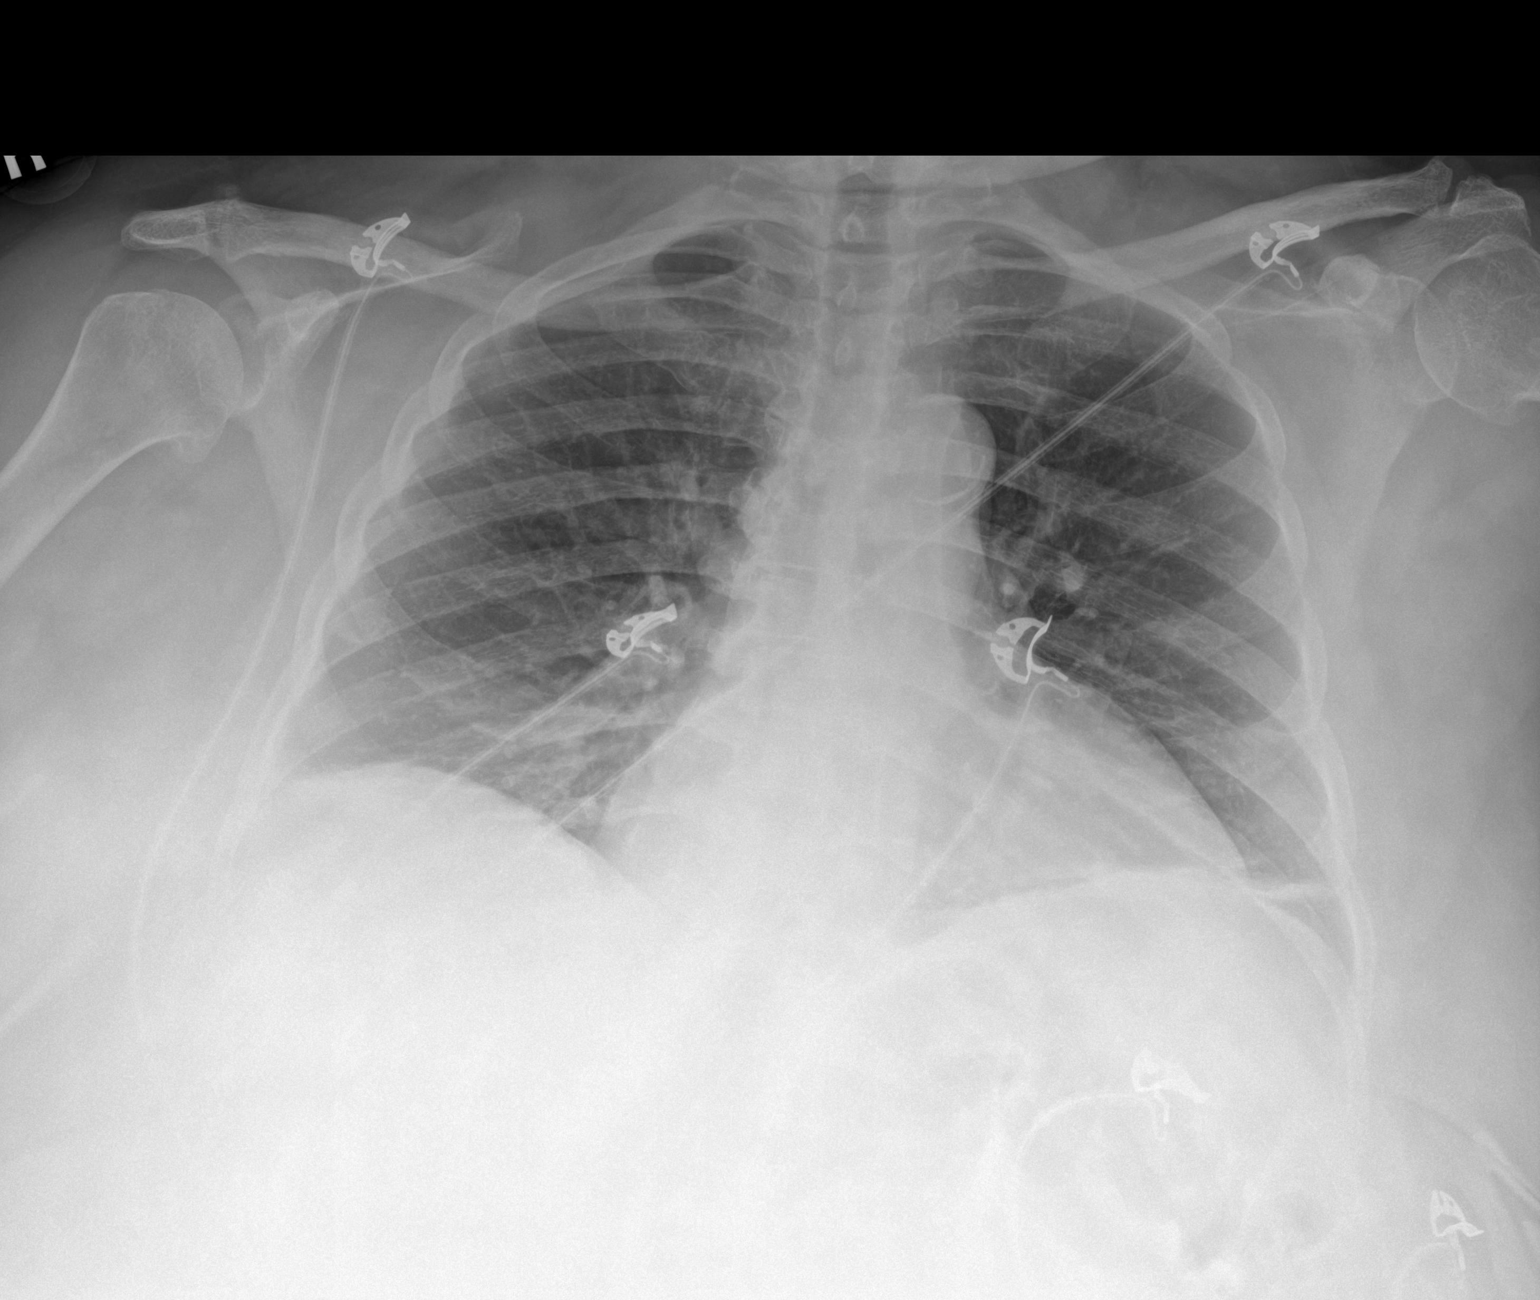

[1 of 1 positions shown; findings below may reference images not displayed]

FINDINGS: Poor inspiration. Interval borderline enlarged cardiac silhouette.
Interval mild linear density at both lung bases. Otherwise, clear
lungs. Aortic arch calcifications. Thoracic spine and bilateral
shoulder degenerative changes.
IMPRESSION: 1. Interval borderline cardiomegaly and mild bibasilar linear
atelectasis or scarring.
2. Aortic atherosclerosis.

## 2022-05-30 IMAGING — CT CT CHEST W/O CM
2 of 3 series · 15 of 36 positions shown, 18 images · non-contrast
Comparison: Chest radiographs earlier today, 02/09/2020. CT Abdomen
and Pelvis 11/09/2008.

CLINICAL DATA: 59-year-old female with increasing shortness of
breath for 2 weeks. Lower extremity edema.

EXAM:
CT CHEST WITHOUT CONTRAST
TECHNIQUE: Multidetector CT imaging of the chest was performed following the
standard protocol without IV contrast.

[Series 2: thorax · axial · 0.78mm/px · z∈[-682,-434]mm · 12 of 146 slices shown, 15 images]
[im 11/146  mediastinal]
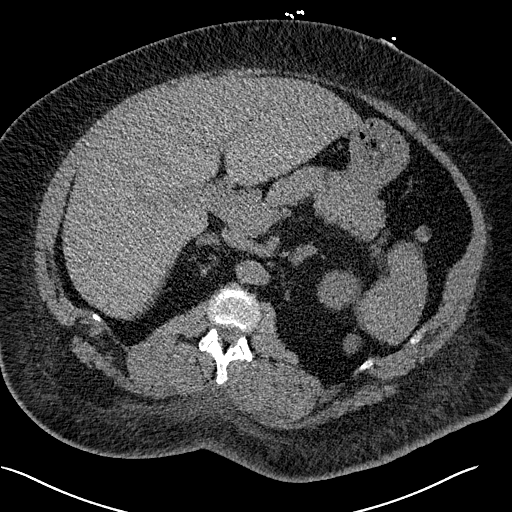
[im 11/146  lung]
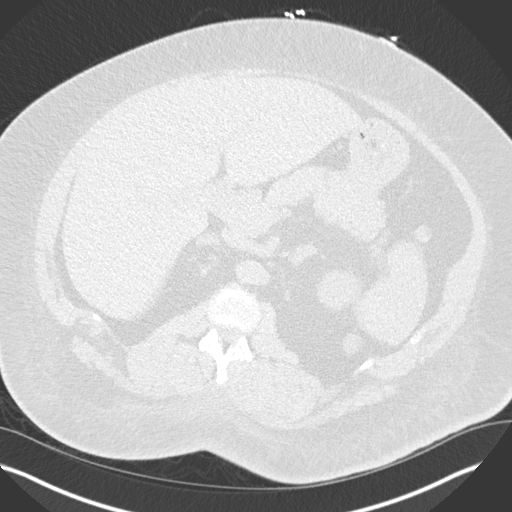
[im 22/146  lung]
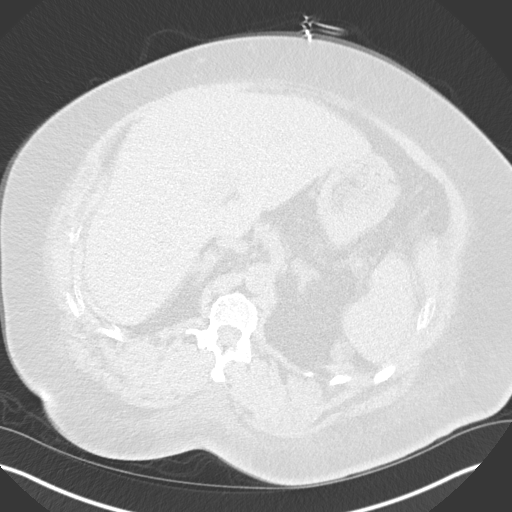
[im 33/146  lung]
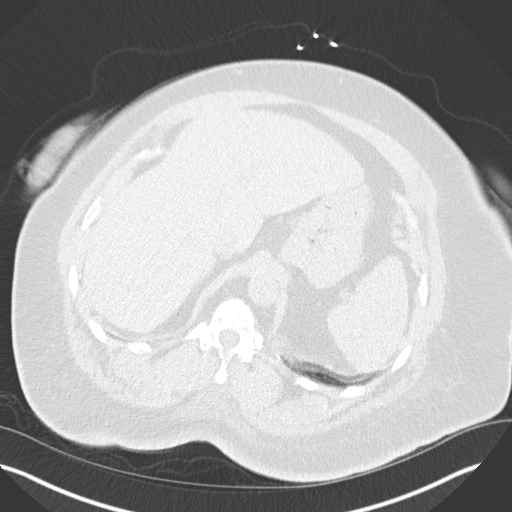
[im 43/146  lung]
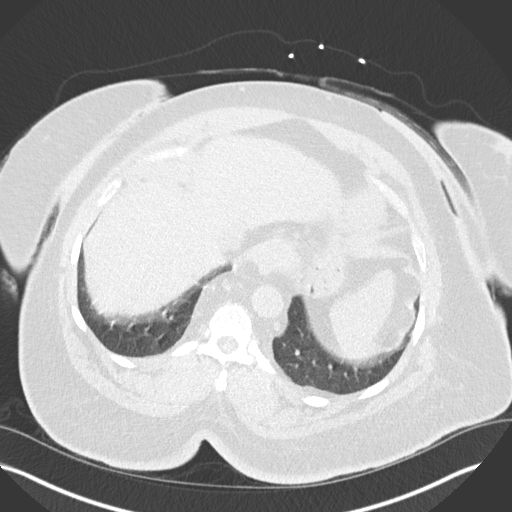
[im 54/146  mediastinal]
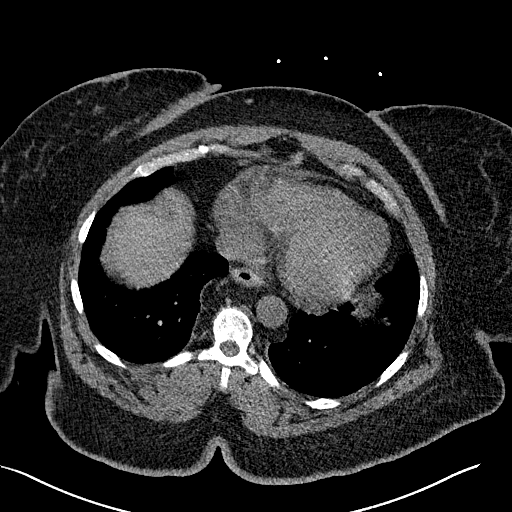
[im 54/146  lung]
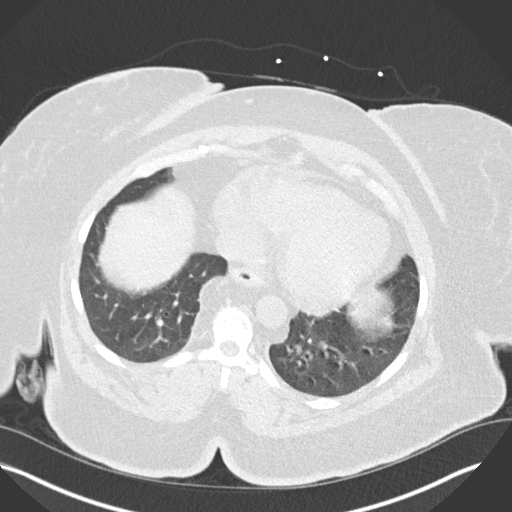
[im 65/146  lung]
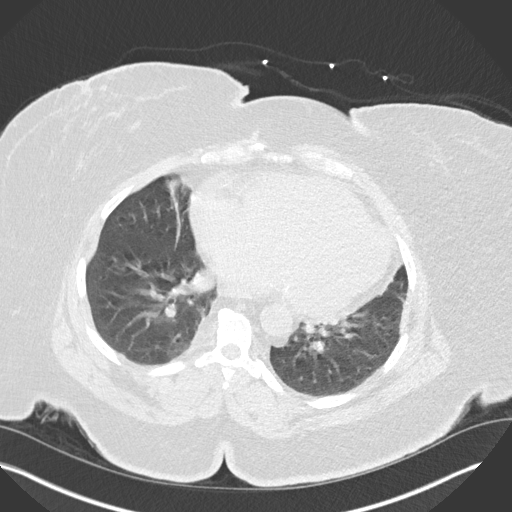
[im 81/146  lung]
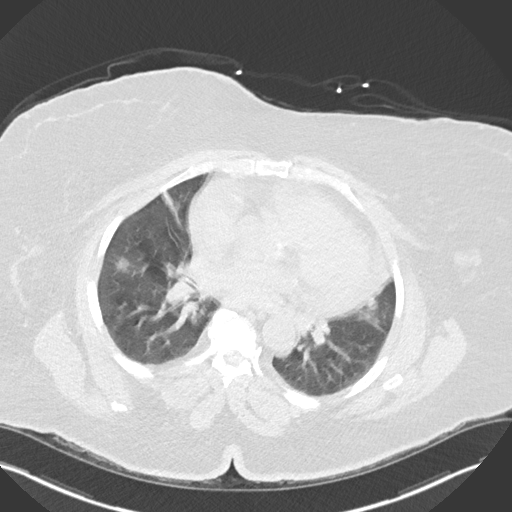
[im 92/146  lung]
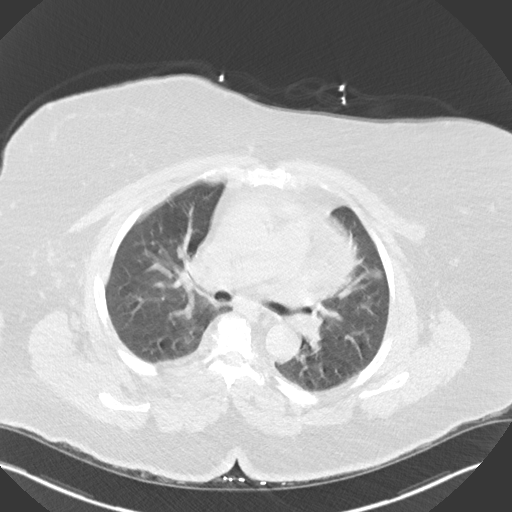
[im 103/146  mediastinal]
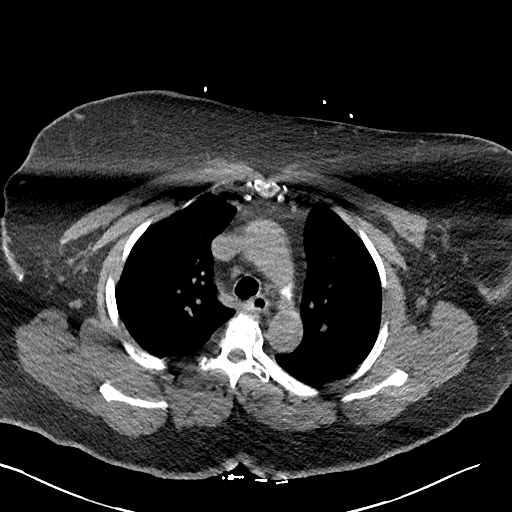
[im 103/146  lung]
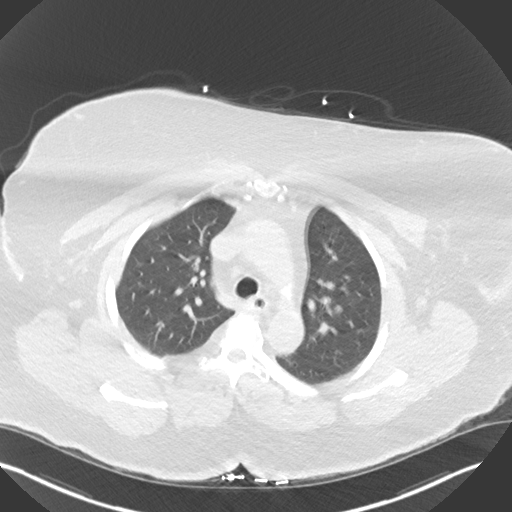
[im 113/146  lung]
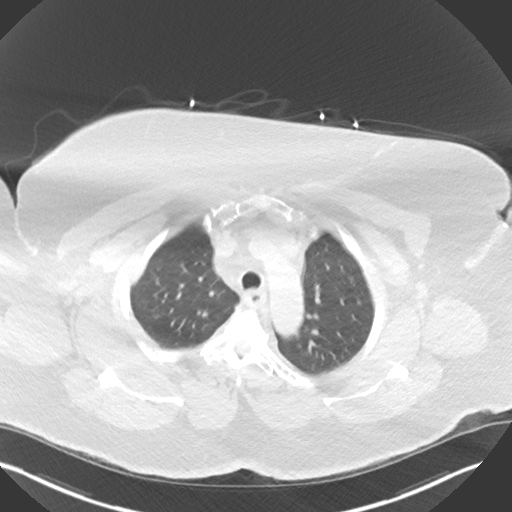
[im 124/146  lung]
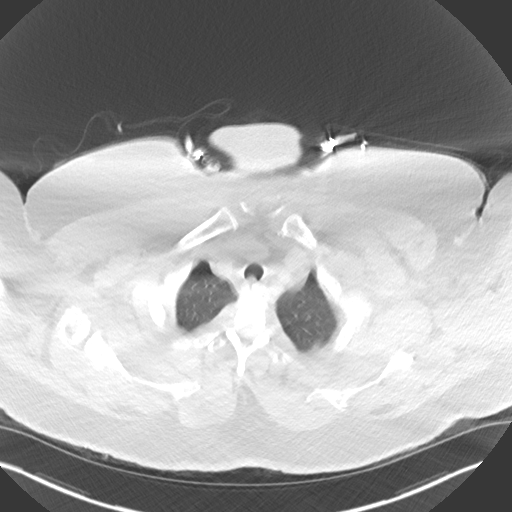
[im 135/146  lung]
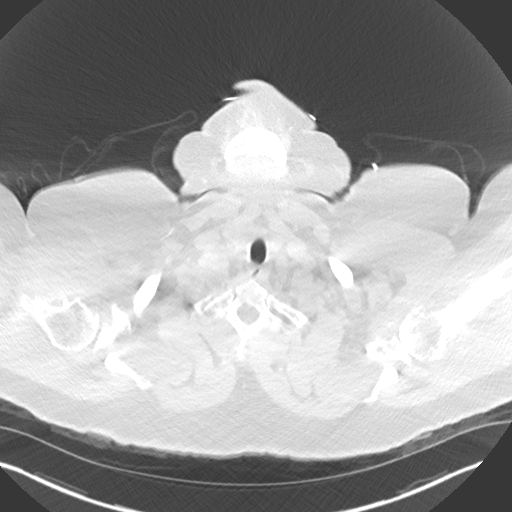

[Series 5: coronal · coronal · 0.58mm/px · 3 of 150 slices shown]
[im 30/150  lung]
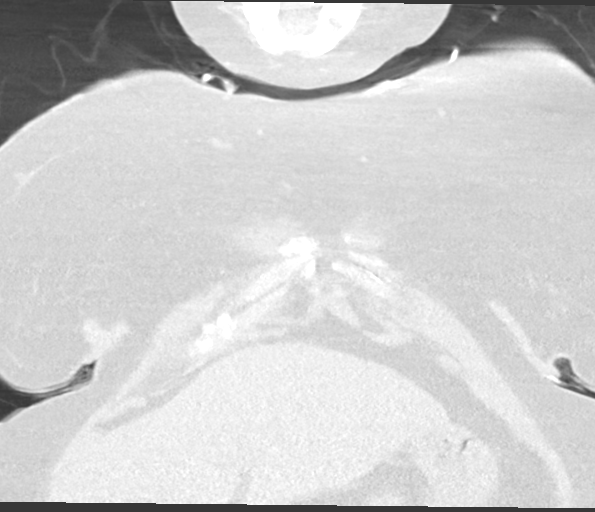
[im 60/150  lung]
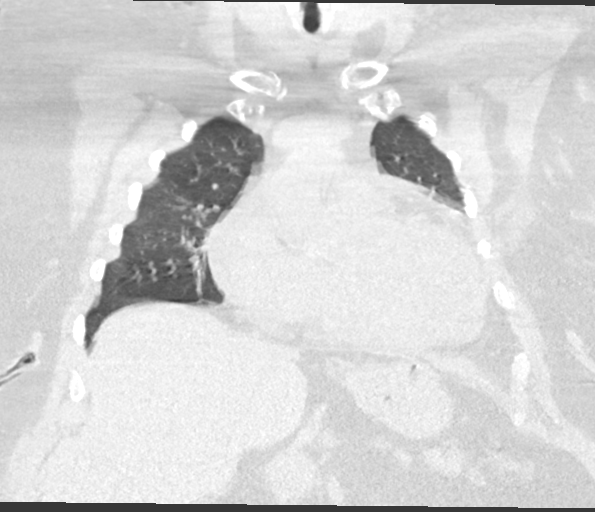
[im 90/150  lung]
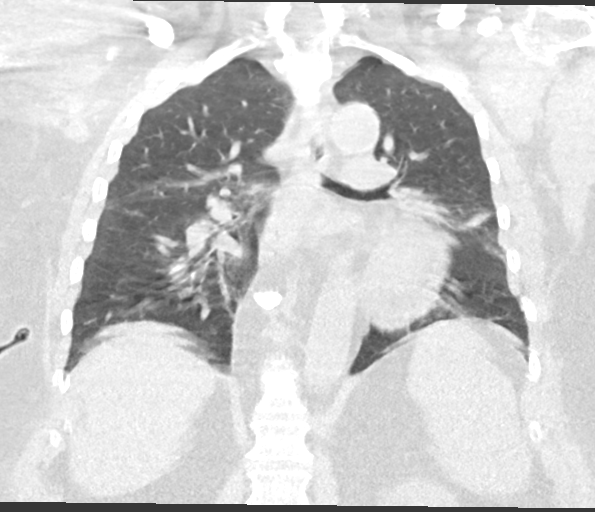

[15 of 36 positions shown; findings below may reference images not displayed]

FINDINGS: Cardiovascular: Moderate cardiomegaly is new since 6005. Small
volume pericardial effusion also present, but most pronounced at the
cardiac base, proximal great vessels. Calcified aortic
atherosclerosis. Calcified coronary artery atherosclerosis on series
2, image 62. Vascular patency is not evaluated in the absence of IV
contrast.

Mediastinum/Nodes: No lymphadenopathy. A small gastric hiatal hernia
is stable since 6005.

Lungs/Pleura: Mild respiratory motion. Major airways are patent.
Mild perihilar and retrocardiac atelectasis, primarily in the
lingula and right middle lobe. No pleural effusion.

No pulmonary edema, but there is a solitary small focus of
nonspecific ground-glass opacity in the right mid lung (coronal
image 78) near the minor fissure.

Upper Abdomen: Chronic hepatomegaly, but otherwise negative visible
noncontrast liver. No splenomegaly. Incidental splenule. (Normal
variant). Negative visible noncontrast adrenal glands, left kidney,
and other bowel in the upper abdomen.

Musculoskeletal: Motion artifact most affecting the ribs. No acute
osseous abnormality identified.
IMPRESSION: 1. Moderate cardiomegaly is new since 6005 and there is a small or
trace pericardial effusion. Calcified coronary artery and Aortic
Atherosclerosis ([id number]).

2. Solitary small focus of ground-glass opacity in the right middle
lobe such that mild or developing Bronchopneumonia is not excluded.
But there is no generalized pulmonary edema, no pleural effusion.
There is mild atelectasis.

3. Chronic hepatomegaly. Small gastric hiatal hernia.

## 2022-05-30 IMAGING — CR DG CHEST 2V
1 series · 2 of 2 positions shown · non-contrast
Comparison: Radiograph 02/09/2020

CLINICAL DATA: Worsening shortness of breath for 2 weeks

EXAM:
CHEST - 2 VIEW

[Series 1: dg chest 2 view · 0.14mm/px · 2 of 2 slices shown]
[im 1/2]
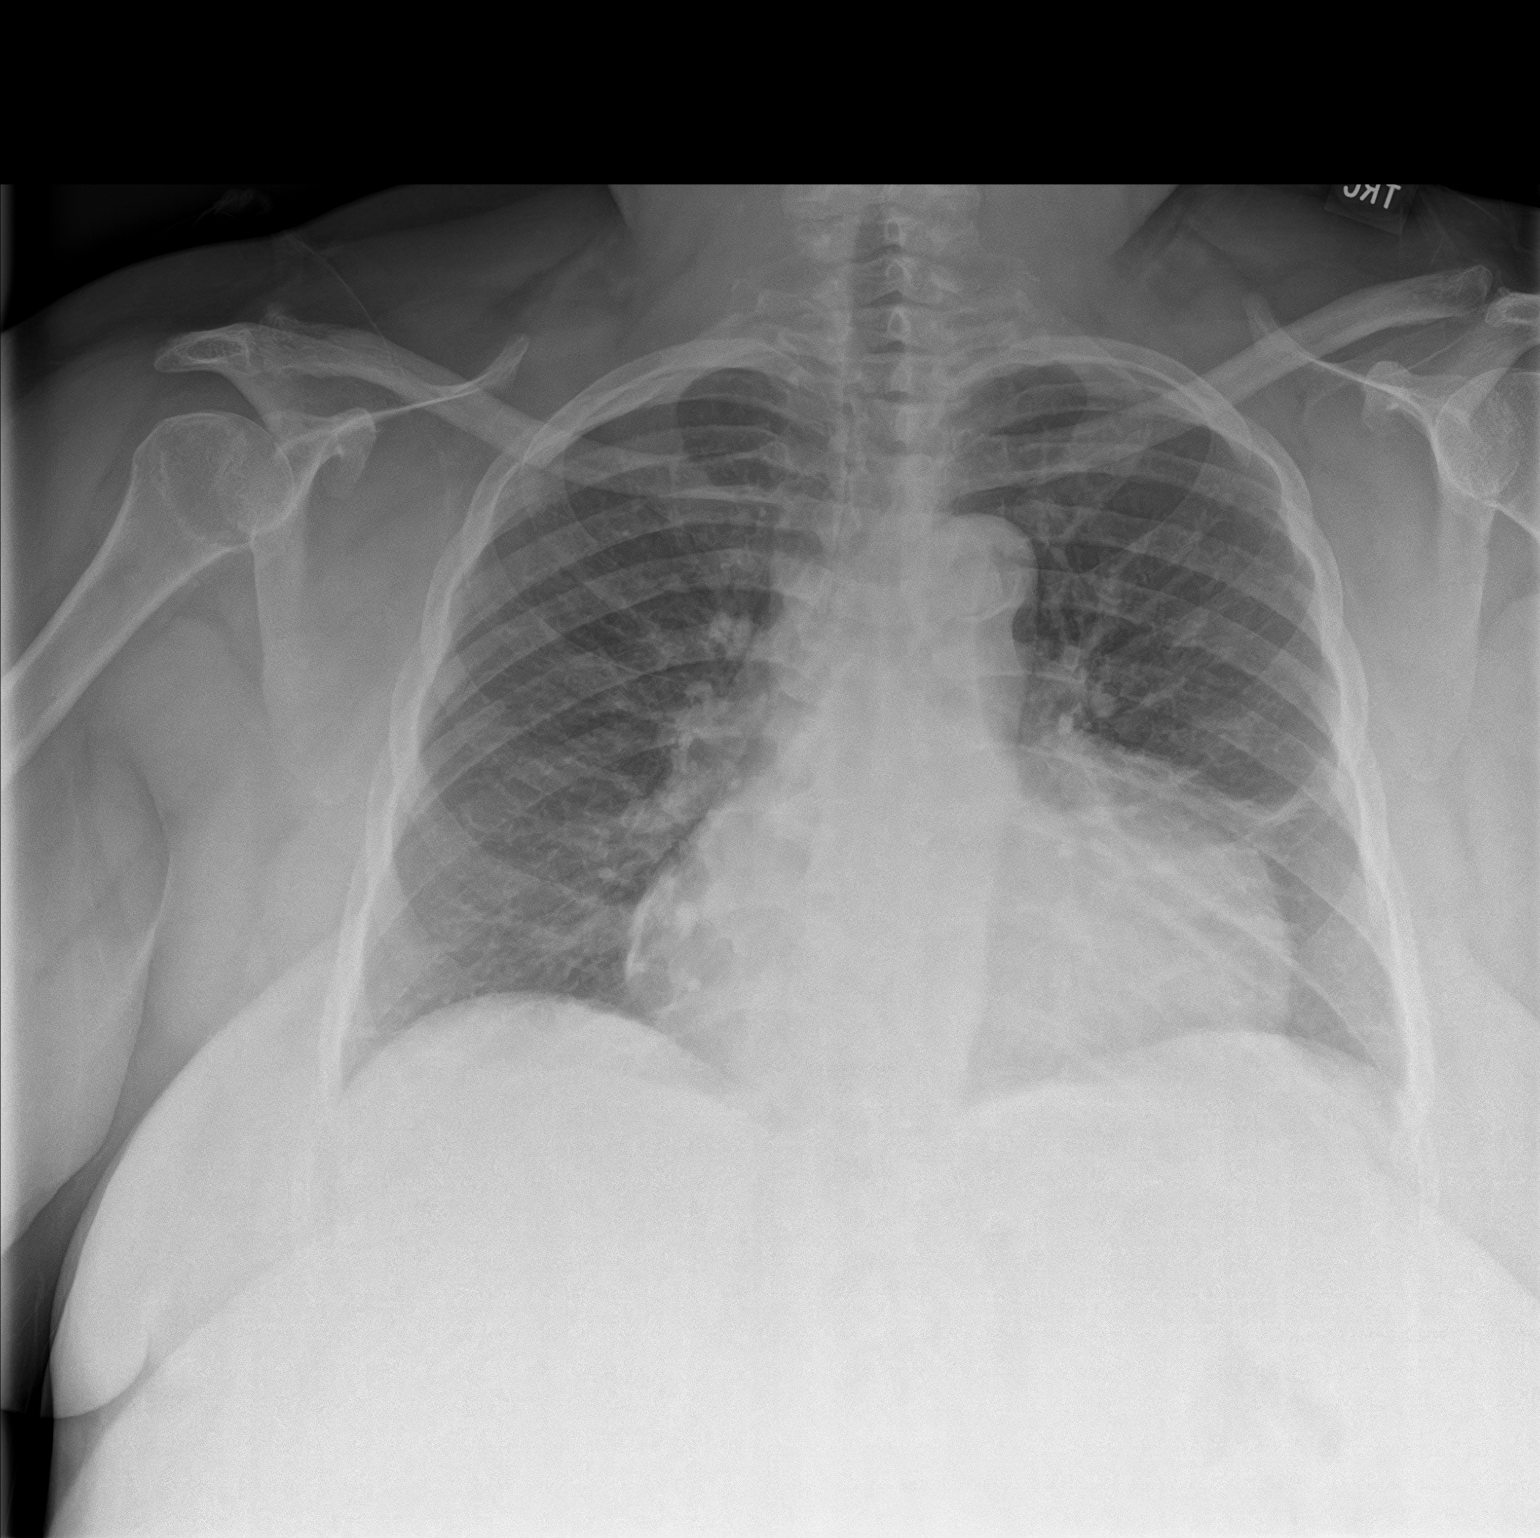
[im 2/2]
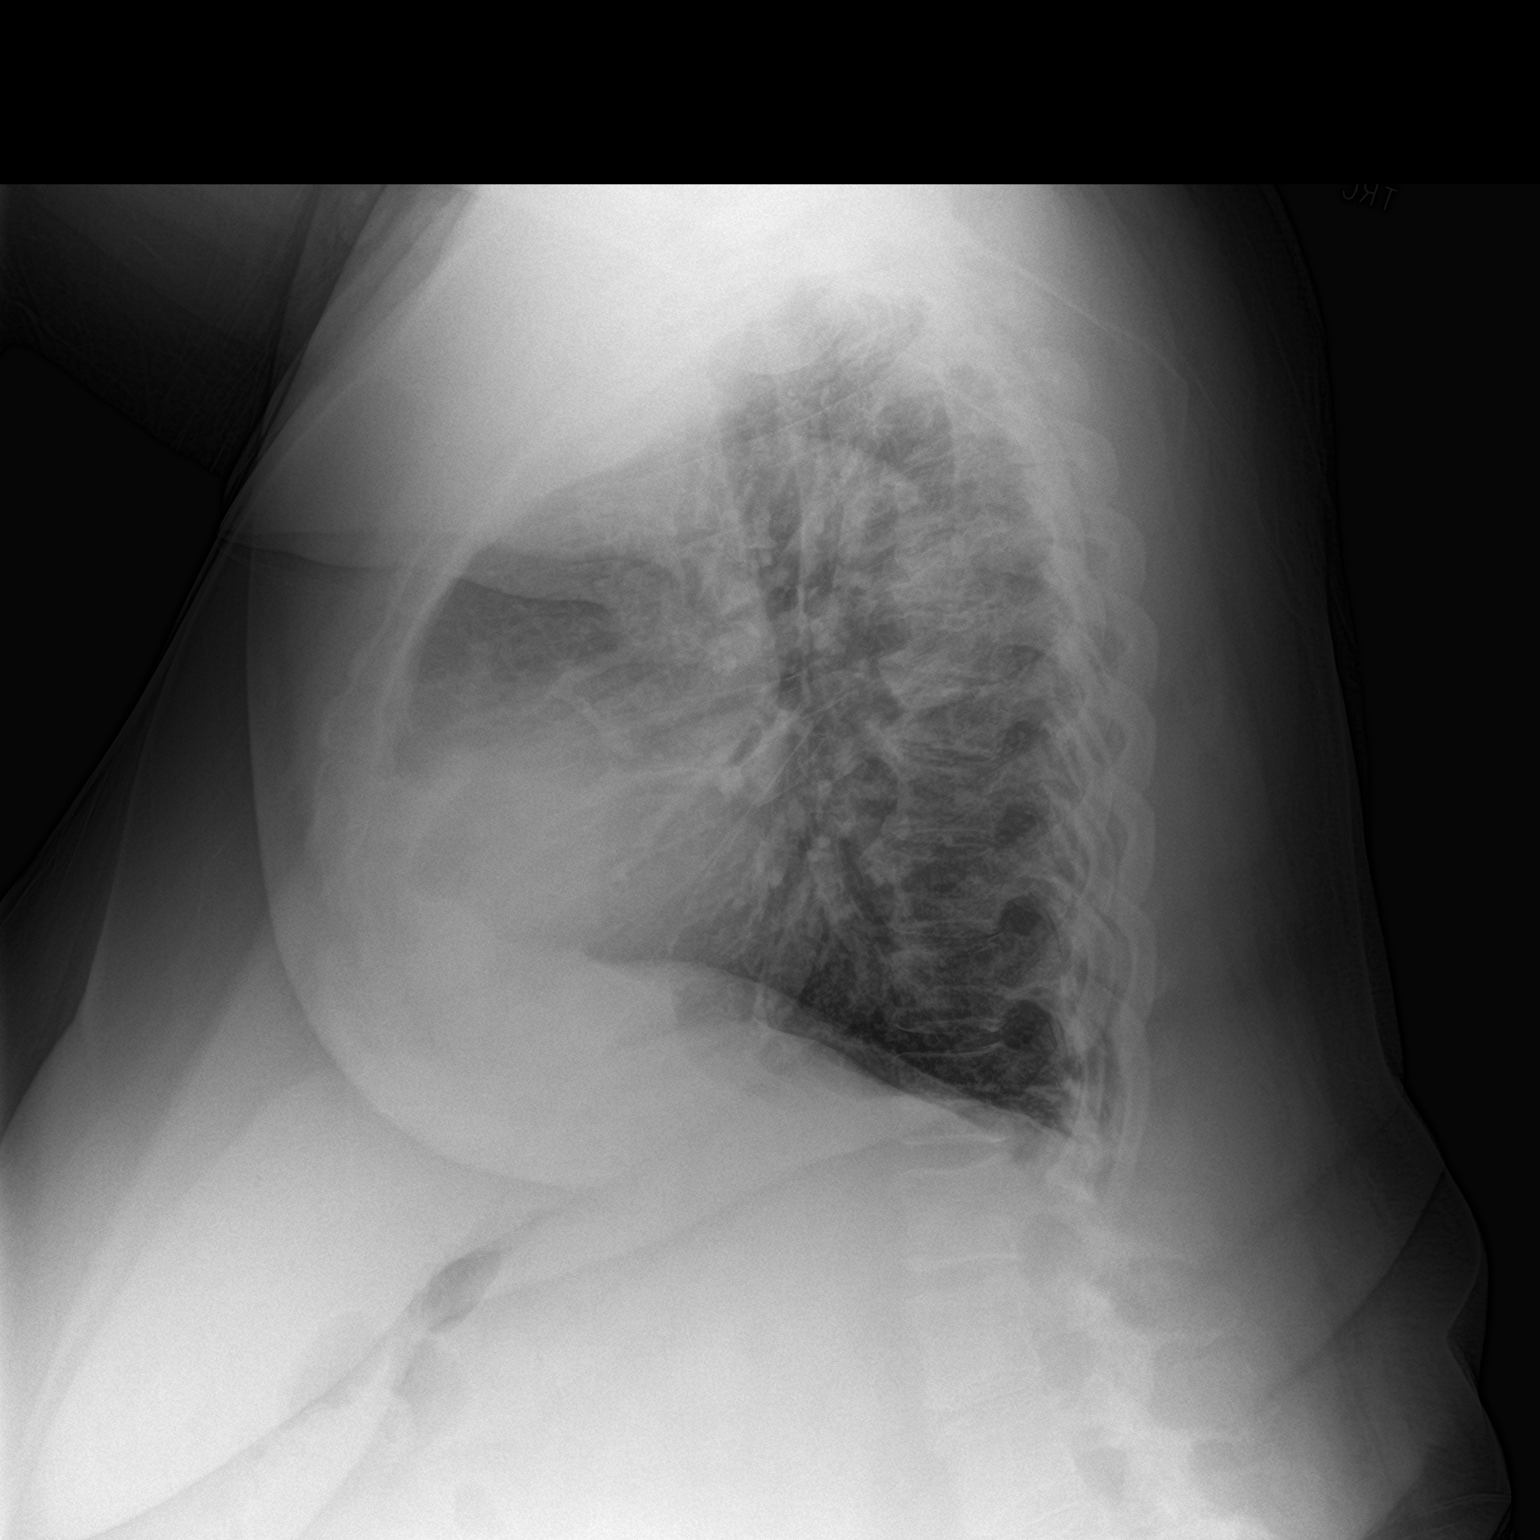

[2 of 2 positions shown; findings below may reference images not displayed]

FINDINGS: Mild pulmonary vascular congestion within enlarged, lobular cardiac
silhouette. The aorta is calcified. The remaining cardiomediastinal
contours are unremarkable. Low volumes and atelectatic changes are
present with vascular crowding. More bandlike opacity in the left
mid lung, likely atelectatic or scarring. No other focal
consolidative process. No pneumothorax or visible effusion. No acute
osseous or soft tissue abnormality. Degenerative changes are present
in the imaged spine and shoulders.
IMPRESSION: 1. Pulmonary vascular congestion and enlarged cardiac silhouette,
could reflect cardiomegaly or pericardial effusion given lobular
cardiac contours.
2. Low volumes and atelectasis.

## 2022-05-31 IMAGING — US US RENAL
1 series · 14 of 20 positions shown · non-contrast
Comparison: None.

CLINICAL DATA: None

EXAM:
RENAL / URINARY TRACT ULTRASOUND COMPLETE

[Series 1: us renal · 14 of 20 slices shown]
[im 1/20]
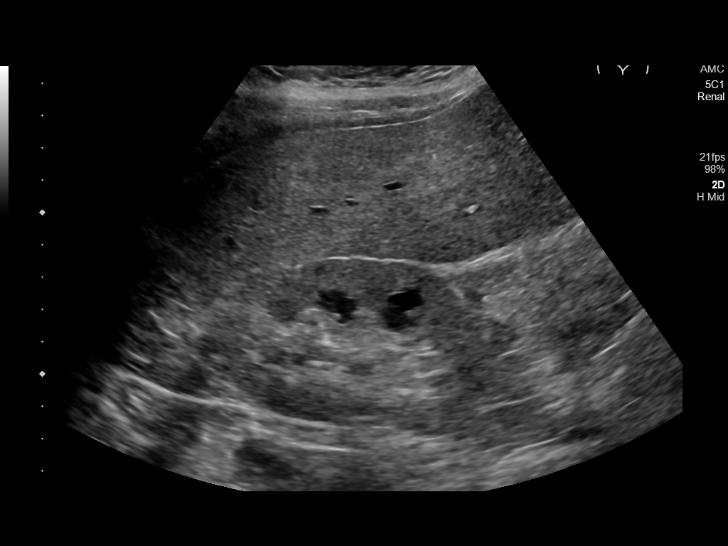
[im 3/20]
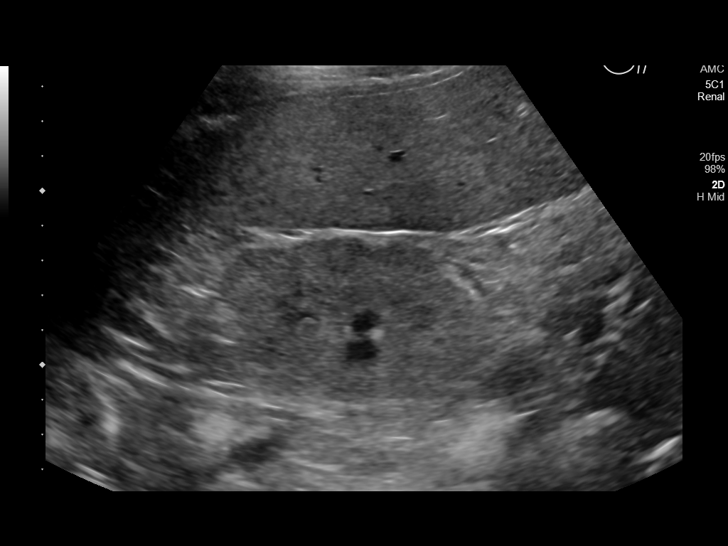
[im 4/20]
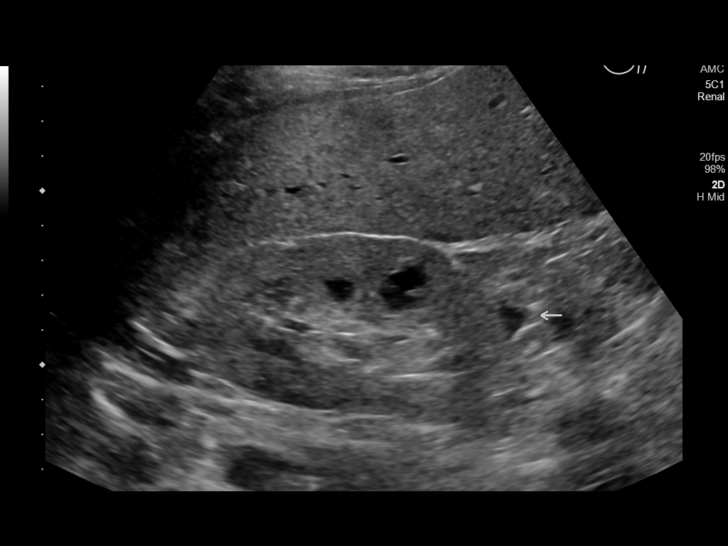
[im 6/20]
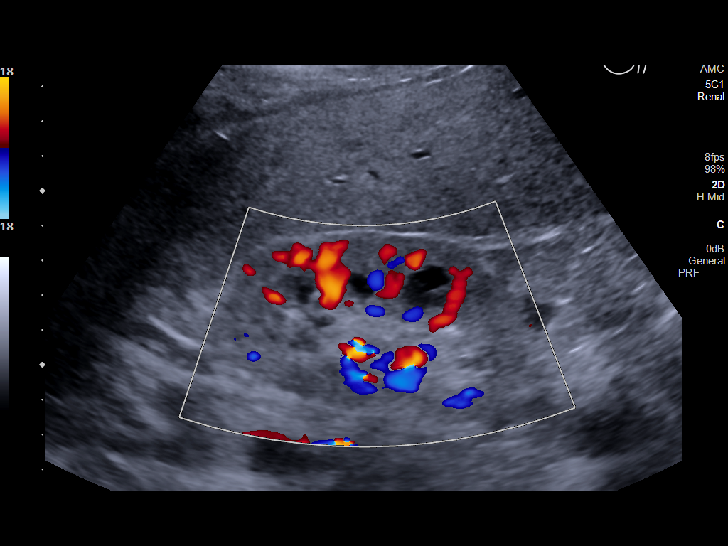
[im 7/20]
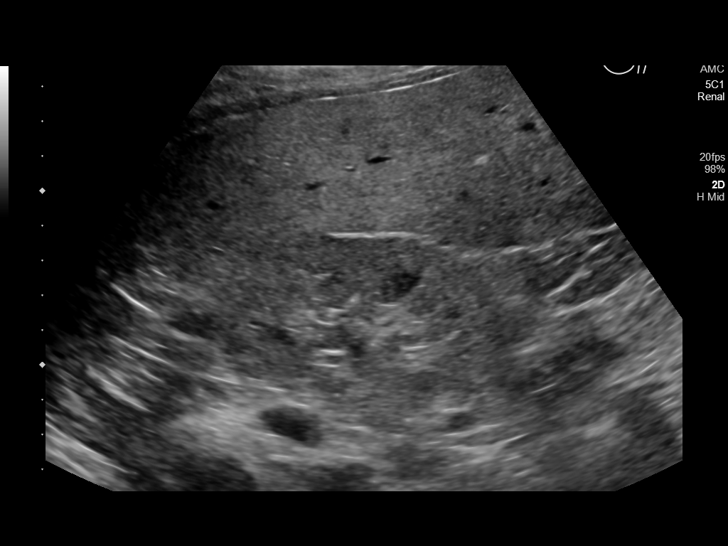
[im 8/20]
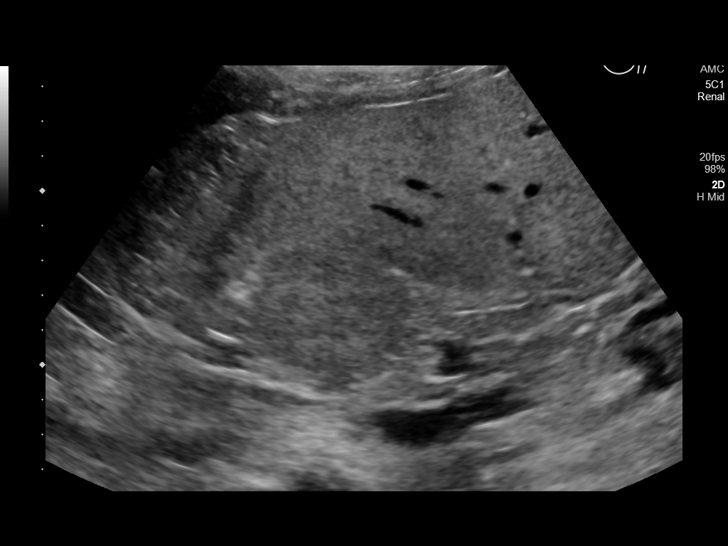
[im 10/20]
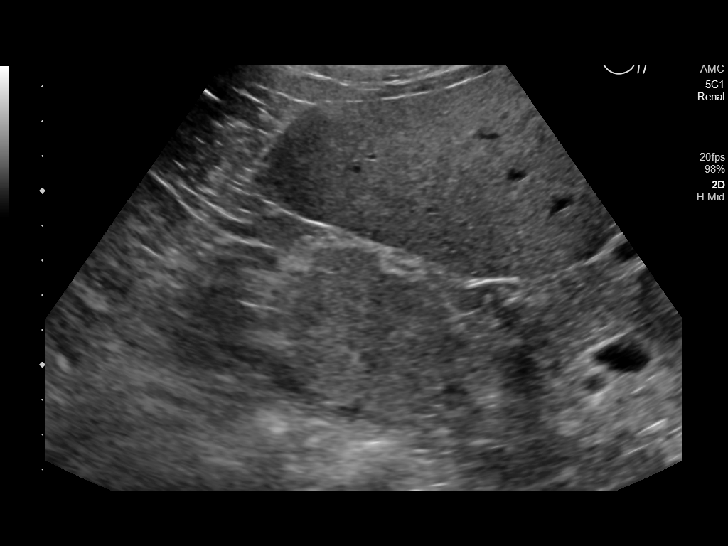
[im 11/20]
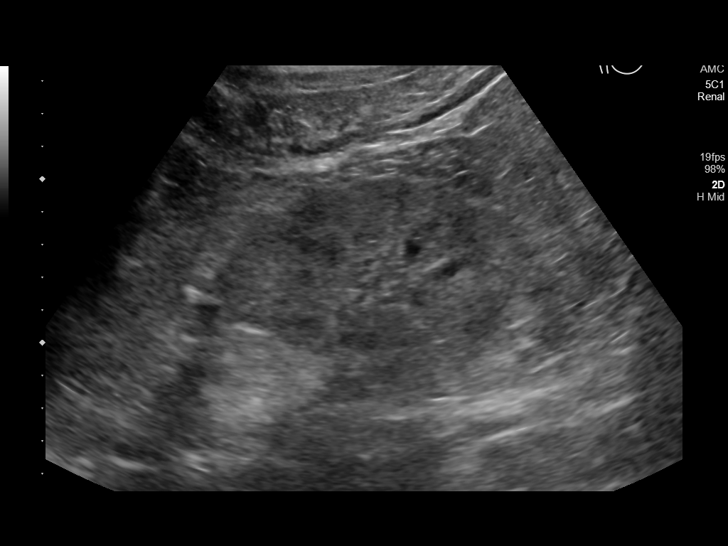
[im 13/20]
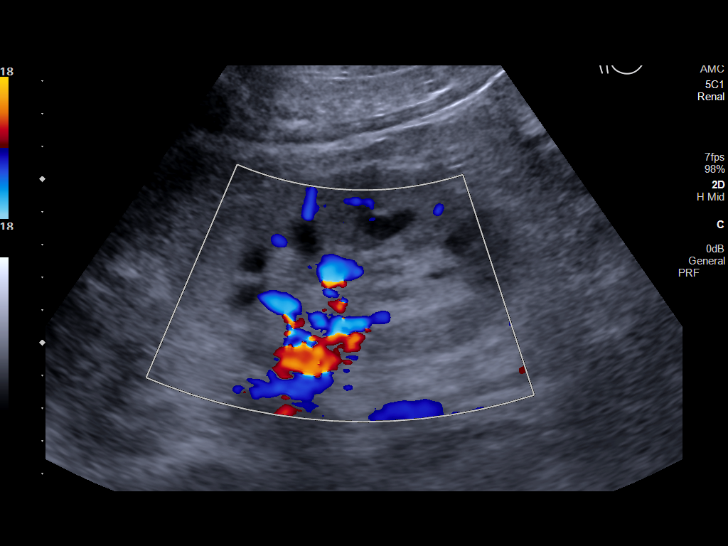
[im 14/20]
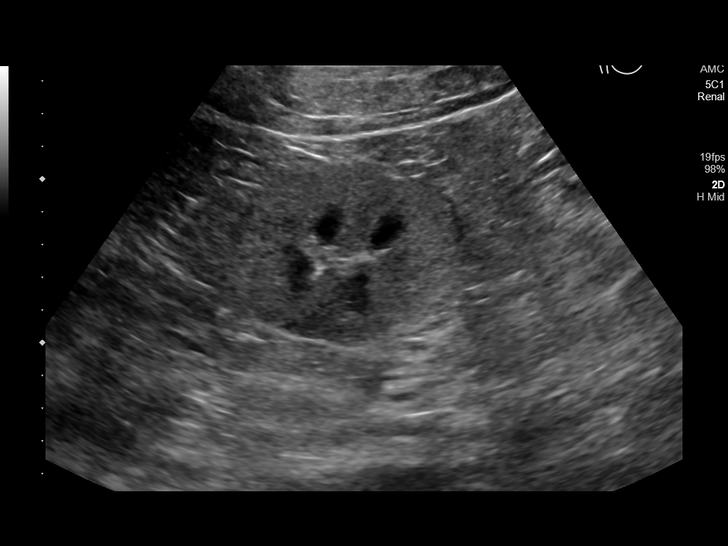
[im 16/20]
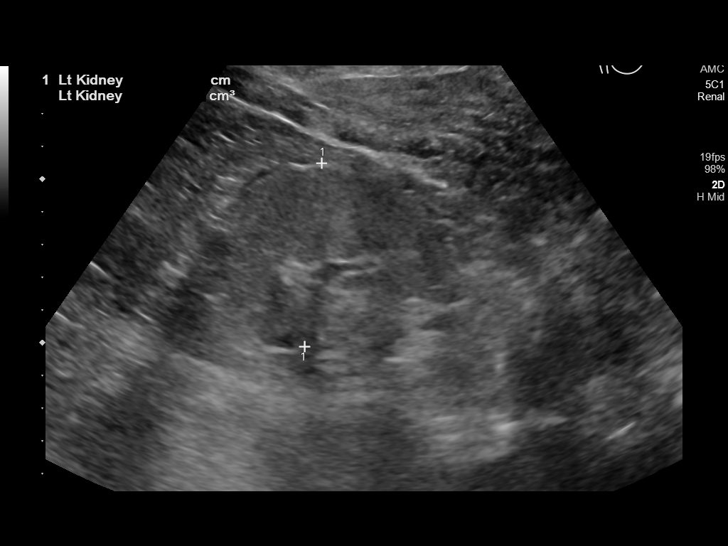
[im 17/20]
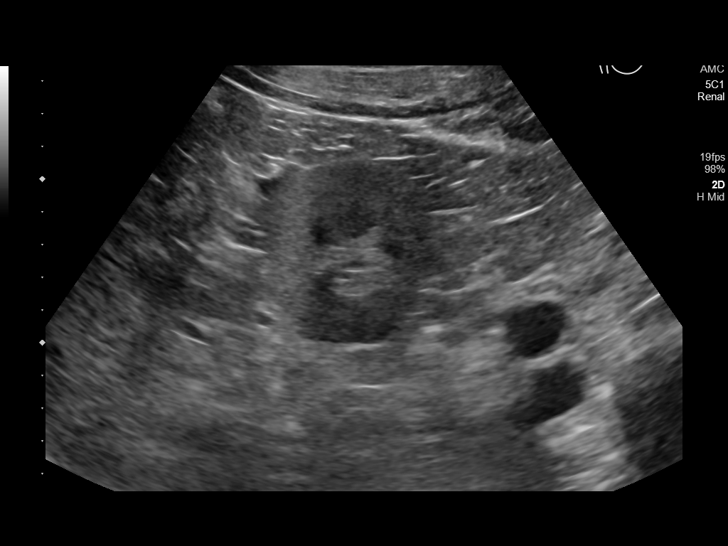
[im 18/20]
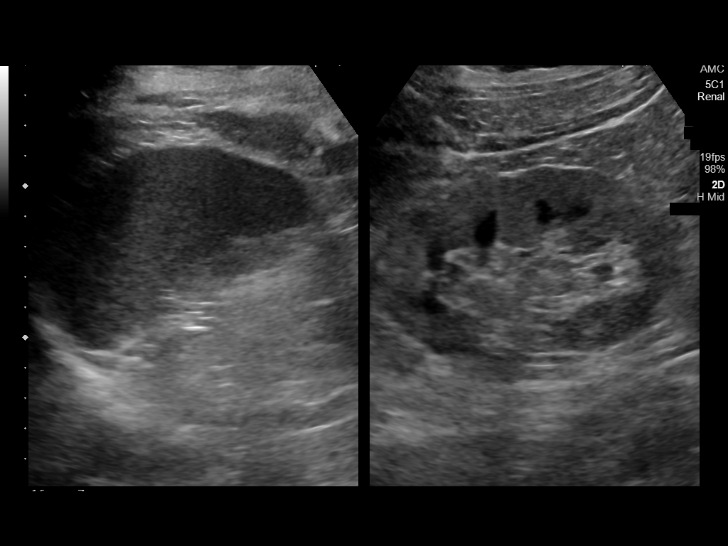
[im 20/20]
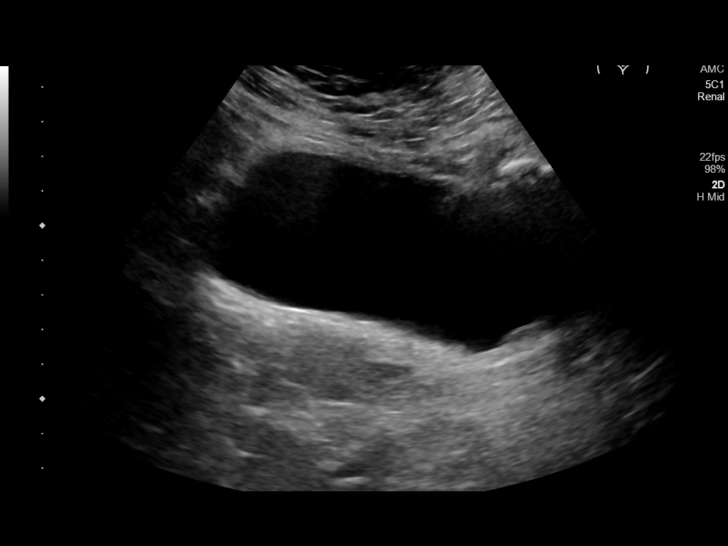

[14 of 20 positions shown; findings below may reference images not displayed]

FINDINGS: Right Kidney:

Renal measurements: 9.7 x 5 x 5.3 cm = volume: 135 mL. Cortex is
echogenic. No hydronephrosis or mass

Left Kidney:

Renal measurements: 10.3 x 6 x 5.6 cm = volume: 184 mL. Cortex is
echogenic. No hydronephrosis or mass

Bladder:

Appears normal for degree of bladder distention.

Other:

None.
IMPRESSION: Echogenic kidneys bilaterally consistent with medical renal disease.
Negative for hydronephrosis

## 2023-05-23 ENCOUNTER — Inpatient Hospital Stay
Admission: EM | Admit: 2023-05-23 | Discharge: 2023-05-31 | DRG: 291 | Disposition: A | Discharge status: Home / Self Care | Payer: BC Managed Care – PPO | Attending: Internal Medicine | Admitting: Internal Medicine

## 2023-05-23 ENCOUNTER — Other Ambulatory Visit: Payer: Self-pay

## 2023-05-23 ENCOUNTER — Inpatient Hospital Stay (HOSPITAL_COMMUNITY)
Admit: 2023-05-23 | Discharge: 2023-05-23 | Disposition: A | Discharge status: Home / Self Care | Payer: BC Managed Care – PPO | Attending: Family Medicine | Admitting: Family Medicine

## 2023-05-23 ENCOUNTER — Encounter: Payer: Self-pay | Admitting: Emergency Medicine

## 2023-05-23 ENCOUNTER — Inpatient Hospital Stay: Payer: BC Managed Care – PPO

## 2023-05-23 ENCOUNTER — Emergency Department: Payer: BC Managed Care – PPO

## 2023-05-23 DIAGNOSIS — E1169 Type 2 diabetes mellitus with other specified complication: Secondary | ICD-10-CM

## 2023-05-23 DIAGNOSIS — E669 Obesity, unspecified: Secondary | ICD-10-CM

## 2023-05-23 DIAGNOSIS — N184 Chronic kidney disease, stage 4 (severe): Secondary | ICD-10-CM | POA: Diagnosis present

## 2023-05-23 DIAGNOSIS — E1122 Type 2 diabetes mellitus with diabetic chronic kidney disease: Secondary | ICD-10-CM | POA: Diagnosis not present

## 2023-05-23 DIAGNOSIS — I5031 Acute diastolic (congestive) heart failure: Secondary | ICD-10-CM | POA: Diagnosis not present

## 2023-05-23 DIAGNOSIS — K5900 Constipation, unspecified: Secondary | ICD-10-CM | POA: Diagnosis not present

## 2023-05-23 DIAGNOSIS — N179 Acute kidney failure, unspecified: Secondary | ICD-10-CM | POA: Diagnosis not present

## 2023-05-23 DIAGNOSIS — E119 Type 2 diabetes mellitus without complications: Secondary | ICD-10-CM

## 2023-05-23 DIAGNOSIS — N186 End stage renal disease: Secondary | ICD-10-CM | POA: Diagnosis present

## 2023-05-23 DIAGNOSIS — K219 Gastro-esophageal reflux disease without esophagitis: Secondary | ICD-10-CM | POA: Diagnosis present

## 2023-05-23 DIAGNOSIS — I5030 Unspecified diastolic (congestive) heart failure: Secondary | ICD-10-CM

## 2023-05-23 DIAGNOSIS — D631 Anemia in chronic kidney disease: Secondary | ICD-10-CM | POA: Diagnosis not present

## 2023-05-23 DIAGNOSIS — Z992 Dependence on renal dialysis: Secondary | ICD-10-CM | POA: Diagnosis not present

## 2023-05-23 DIAGNOSIS — J45909 Unspecified asthma, uncomplicated: Secondary | ICD-10-CM | POA: Diagnosis present

## 2023-05-23 DIAGNOSIS — Z6841 Body Mass Index (BMI) 40.0 and over, adult: Secondary | ICD-10-CM | POA: Diagnosis not present

## 2023-05-23 DIAGNOSIS — Z794 Long term (current) use of insulin: Secondary | ICD-10-CM | POA: Diagnosis not present

## 2023-05-23 DIAGNOSIS — Z888 Allergy status to other drugs, medicaments and biological substances status: Secondary | ICD-10-CM

## 2023-05-23 DIAGNOSIS — Z7982 Long term (current) use of aspirin: Secondary | ICD-10-CM

## 2023-05-23 DIAGNOSIS — Z23 Encounter for immunization: Secondary | ICD-10-CM

## 2023-05-23 DIAGNOSIS — I132 Hypertensive heart and chronic kidney disease with heart failure and with stage 5 chronic kidney disease, or end stage renal disease: Secondary | ICD-10-CM | POA: Diagnosis present

## 2023-05-23 DIAGNOSIS — Z5982 Transportation insecurity: Secondary | ICD-10-CM

## 2023-05-23 DIAGNOSIS — Z79899 Other long term (current) drug therapy: Secondary | ICD-10-CM

## 2023-05-23 DIAGNOSIS — R519 Headache, unspecified: Secondary | ICD-10-CM | POA: Diagnosis not present

## 2023-05-23 DIAGNOSIS — M25561 Pain in right knee: Secondary | ICD-10-CM | POA: Diagnosis present

## 2023-05-23 DIAGNOSIS — R601 Generalized edema: Secondary | ICD-10-CM | POA: Diagnosis present

## 2023-05-23 DIAGNOSIS — I10 Essential (primary) hypertension: Secondary | ICD-10-CM | POA: Diagnosis not present

## 2023-05-23 DIAGNOSIS — I5033 Acute on chronic diastolic (congestive) heart failure: Secondary | ICD-10-CM | POA: Diagnosis present

## 2023-05-23 DIAGNOSIS — N170 Acute kidney failure with tubular necrosis: Secondary | ICD-10-CM | POA: Diagnosis not present

## 2023-05-23 DIAGNOSIS — I493 Ventricular premature depolarization: Secondary | ICD-10-CM | POA: Diagnosis present

## 2023-05-23 DIAGNOSIS — M25562 Pain in left knee: Secondary | ICD-10-CM | POA: Diagnosis present

## 2023-05-23 DIAGNOSIS — I509 Heart failure, unspecified: Principal | ICD-10-CM

## 2023-05-23 HISTORY — DX: Generalized edema: R60.1

## 2023-05-23 HISTORY — DX: Morbid (severe) obesity due to excess calories: E66.01

## 2023-05-23 HISTORY — DX: Chronic kidney disease, stage 4 (severe): N18.4

## 2023-05-23 HISTORY — DX: Chronic diastolic (congestive) heart failure: I50.32

## 2023-05-23 LAB — COMPREHENSIVE METABOLIC PANEL
ALT: 14 U/L (ref 0–44)
AST: 16 U/L (ref 15–41)
Albumin: 3.3 g/dL — ABNORMAL LOW (ref 3.5–5.0)
Alkaline Phosphatase: 166 U/L — ABNORMAL HIGH (ref 38–126)
Anion gap: 12 (ref 5–15)
BUN: 65 mg/dL — ABNORMAL HIGH (ref 8–23)
CO2: 20 mmol/L — ABNORMAL LOW (ref 22–32)
Calcium: 8.7 mg/dL — ABNORMAL LOW (ref 8.9–10.3)
Chloride: 111 mmol/L (ref 98–111)
Creatinine, Ser: 4.06 mg/dL — ABNORMAL HIGH (ref 0.44–1.00)
GFR, Estimated: 12 mL/min — ABNORMAL LOW (ref >60)
Glucose, Bld: 127 mg/dL — ABNORMAL HIGH (ref 70–99)
Potassium: 3.8 mmol/L (ref 3.5–5.1)
Sodium: 143 mmol/L (ref 135–145)
Total Bilirubin: 0.3 mg/dL (ref 0.3–1.2)
Total Protein: 7 g/dL (ref 6.5–8.1)

## 2023-05-23 LAB — CBC
HCT: 27.2 % — ABNORMAL LOW (ref 36.0–46.0)
Hemoglobin: 8.7 g/dL — ABNORMAL LOW (ref 12.0–15.0)
MCH: 29.2 pg (ref 26.0–34.0)
MCHC: 32 g/dL (ref 30.0–36.0)
MCV: 91.3 fL (ref 80.0–100.0)
Platelets: 294 10*3/uL (ref 150–400)
RBC: 2.98 MIL/uL — ABNORMAL LOW (ref 3.87–5.11)
RDW: 16 % — ABNORMAL HIGH (ref 11.5–15.5)
WBC: 7.5 10*3/uL (ref 4.0–10.5)
nRBC: 0 % (ref 0.0–0.2)

## 2023-05-23 LAB — ECHOCARDIOGRAM COMPLETE
AR max vel: 1.88 cm2
AV Area VTI: 2.55 cm2
AV Area mean vel: 2.14 cm2
AV Mean grad: 6 mmHg
AV Peak grad: 11 mmHg
Ao pk vel: 1.66 m/s
Area-P 1/2: 2.54 cm2
Height: 57 in
MV VTI: 2.59 cm2
S' Lateral: 2.8 cm
Weight: 3552.05 oz

## 2023-05-23 LAB — GLUCOSE, CAPILLARY
Glucose-Capillary: 126 mg/dL — ABNORMAL HIGH (ref 70–99)
Glucose-Capillary: 131 mg/dL — ABNORMAL HIGH (ref 70–99)

## 2023-05-23 LAB — BRAIN NATRIURETIC PEPTIDE: B Natriuretic Peptide: 30 pg/mL (ref 0.0–100.0)

## 2023-05-23 LAB — TROPONIN I (HIGH SENSITIVITY)
Troponin I (High Sensitivity): 14 ng/L (ref <18)
Troponin I (High Sensitivity): 14 ng/L (ref <18)

## 2023-05-23 LAB — PROTEIN / CREATININE RATIO, URINE
Creatinine, Urine: 40 mg/dL
Protein Creatinine Ratio: 7.6 mg/mg{Cre} — ABNORMAL HIGH (ref 0.00–0.15)
Total Protein, Urine: 304 mg/dL

## 2023-05-23 LAB — HIV ANTIBODY (ROUTINE TESTING W REFLEX): HIV Screen 4th Generation wRfx: NONREACTIVE

## 2023-05-23 LAB — CBG MONITORING, ED: Glucose-Capillary: 190 mg/dL — ABNORMAL HIGH (ref 70–99)

## 2023-05-23 LAB — D-DIMER, QUANTITATIVE: D-Dimer, Quant: 0.88 ug/mL-FEU — ABNORMAL HIGH (ref 0.00–0.50)

## 2023-05-23 MED ORDER — ONDANSETRON HCL 4 MG/2ML IJ SOLN: 4.0000 mg | Freq: Four times a day (QID) | INTRAMUSCULAR | Status: DC | PRN

## 2023-05-23 MED ORDER — PANTOPRAZOLE SODIUM 40 MG PO TBEC
40.0000 mg | DELAYED_RELEASE_TABLET | Freq: Every day | ORAL | Status: DC
  Administered 2023-05-23 – 2023-05-31 (×9): 40 mg via ORAL
  Filled 2023-05-23 (×10): qty 1

## 2023-05-23 MED ORDER — FUROSEMIDE 40 MG PO TABS
80.0000 mg | ORAL_TABLET | Freq: Every day | ORAL | Status: DC
  Administered 2023-05-23 – 2023-05-31 (×9): 80 mg via ORAL
  Filled 2023-05-23 (×10): qty 2

## 2023-05-23 MED ORDER — ASPIRIN 81 MG PO TBEC
81.0000 mg | DELAYED_RELEASE_TABLET | Freq: Every day | ORAL | Status: DC
  Administered 2023-05-23 – 2023-05-31 (×9): 81 mg via ORAL
  Filled 2023-05-23 (×10): qty 1

## 2023-05-23 MED ORDER — HEPARIN SODIUM (PORCINE) 5000 UNIT/ML IJ SOLN
5000.0000 [IU] | Freq: Three times a day (TID) | INTRAMUSCULAR | Status: DC
  Administered 2023-05-23 – 2023-05-31 (×24): 5000 [IU] via SUBCUTANEOUS
  Filled 2023-05-23 (×24): qty 1

## 2023-05-23 MED ORDER — CARVEDILOL 6.25 MG PO TABS
6.2500 mg | ORAL_TABLET | Freq: Two times a day (BID) | ORAL | Status: DC
  Administered 2023-05-23 – 2023-05-27 (×9): 6.25 mg via ORAL
  Filled 2023-05-23 (×9): qty 1

## 2023-05-23 MED ORDER — ISOSORBIDE MONONITRATE ER 30 MG PO TB24
30.0000 mg | ORAL_TABLET | Freq: Every day | ORAL | Status: DC
  Administered 2023-05-23 – 2023-05-24 (×2): 30 mg via ORAL
  Filled 2023-05-23 (×2): qty 1

## 2023-05-23 MED ORDER — ALLOPURINOL 100 MG PO TABS
300.0000 mg | ORAL_TABLET | Freq: Every day | ORAL | Status: DC
  Administered 2023-05-23 – 2023-05-24 (×2): 300 mg via ORAL
  Filled 2023-05-23: qty 1
  Filled 2023-05-23: qty 3

## 2023-05-23 MED ORDER — FUROSEMIDE 10 MG/ML IJ SOLN
80.0000 mg | Freq: Once | INTRAMUSCULAR | Status: AC
  Administered 2023-05-23: 80 mg via INTRAVENOUS
  Filled 2023-05-23: qty 8

## 2023-05-23 MED ORDER — HYDRALAZINE HCL 25 MG PO TABS
25.0000 mg | ORAL_TABLET | Freq: Three times a day (TID) | ORAL | Status: DC
  Administered 2023-05-23 – 2023-05-27 (×11): 25 mg via ORAL
  Filled 2023-05-23 (×11): qty 1

## 2023-05-23 MED ORDER — LORATADINE 10 MG PO TABS
10.0000 mg | ORAL_TABLET | Freq: Every day | ORAL | Status: DC
  Administered 2023-05-23 – 2023-05-31 (×9): 10 mg via ORAL
  Filled 2023-05-23 (×10): qty 1

## 2023-05-23 MED ORDER — ACETAMINOPHEN 325 MG PO TABS
650.0000 mg | ORAL_TABLET | Freq: Four times a day (QID) | ORAL | Status: DC | PRN
  Administered 2023-05-23 – 2023-05-31 (×17): 650 mg via ORAL
  Filled 2023-05-23 (×17): qty 2

## 2023-05-23 MED ORDER — INSULIN ASPART 100 UNIT/ML IJ SOLN
0.0000 [IU] | Freq: Three times a day (TID) | INTRAMUSCULAR | Status: DC
  Administered 2023-05-23: 2 [IU] via SUBCUTANEOUS
  Administered 2023-05-24 – 2023-05-26 (×4): 1 [IU] via SUBCUTANEOUS
  Administered 2023-05-26: 2 [IU] via SUBCUTANEOUS
  Administered 2023-05-27: 1 [IU] via SUBCUTANEOUS
  Administered 2023-05-27 – 2023-05-28 (×2): 2 [IU] via SUBCUTANEOUS
  Administered 2023-05-29: 1 [IU] via SUBCUTANEOUS
  Administered 2023-05-29 (×2): 2 [IU] via SUBCUTANEOUS
  Administered 2023-05-30 – 2023-05-31 (×2): 1 [IU] via SUBCUTANEOUS
  Filled 2023-05-23 (×15): qty 1

## 2023-05-23 MED ORDER — SODIUM CHLORIDE 0.9 % IV SOLN: 250.0000 mL | INTRAVENOUS | Status: DC | PRN

## 2023-05-23 MED ORDER — SODIUM CHLORIDE 0.9% FLUSH
3.0000 mL | Freq: Two times a day (BID) | INTRAVENOUS | Status: DC
  Administered 2023-05-23 – 2023-05-31 (×16): 3 mL via INTRAVENOUS

## 2023-05-23 MED ORDER — SODIUM CHLORIDE 0.9% FLUSH: 3.0000 mL | INTRAVENOUS | Status: DC | PRN

## 2023-05-23 MED ORDER — ONDANSETRON HCL 4 MG PO TABS
4.0000 mg | ORAL_TABLET | Freq: Four times a day (QID) | ORAL | Status: DC | PRN
  Administered 2023-05-29 (×2): 4 mg via ORAL

## 2023-05-23 NOTE — Assessment & Plan Note (Signed)
SSI A1c 

## 2023-05-23 NOTE — ED Triage Notes (Signed)
Pt here with SOB x2 weeks. Pt also states a little chest pain. Pt also c/o bilateral knee pain and swelling. Pt denies fall or injury.

## 2023-05-23 NOTE — Assessment & Plan Note (Signed)
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

## 2023-05-23 NOTE — Assessment & Plan Note (Signed)
Stable from respiratory standpoint Continue home inhaler

## 2023-05-23 NOTE — Progress Notes (Signed)
*  PRELIMINARY RESULTS* Echocardiogram 2D Echocardiogram has been performed.  Cristela Blue 05/23/2023, 1:56 PM

## 2023-05-23 NOTE — Consult Note (Signed)
[facility] Consult Note: 05/23/23    Date of Admission:  05/23/2023           Reason for Consult: AKI, chronic renal failure    Referring Provider: Floydene Flock, MD Primary Care Provider: Emogene Morgan, MD   History of Presenting Illness:  Brianna [name] is a 62 y.o. female with medical problems of obesity, asthma, diabetes chronic kidney disease presents with worsening lower extremity edema. She reports that edema has been present but has worsened over the past 3 to 4 weeks. Currently she is on room air. She was given IV furosemide in the emergency room.  She states that she is having to go to the bathroom quite often.  She reports being BC powder frequently for arthritic knee pain. Baseline creatinine of 2.26/GFR 24 from January 2022. This time she presents with creatinine of 4.06/GFR 12. She works for [employer] on [address]. near [employer]. Primary care: [facility]. Previous workup in January 2022 was negative for HIV, ANCA titers, normal C3-C4, negative ANA, negative hepatitis B and C studies, negative protein electrophoresis for M spike, kappa lambda ratio 2.0. Ultrasound in January 2022 indicated echogenic kidneys bilaterally consistent with medical renal disease.  Negative for hydronephrosis.  Review of Systems: Review of Systems  Constitutional: Negative.   HENT: Negative.    Eyes: Negative.   Respiratory: Negative.    Cardiovascular:  Positive for leg swelling.  Gastrointestinal: Negative.   Genitourinary: Negative.   Musculoskeletal: Negative.   Skin: Negative.   Neurological: Negative.   Endo/Heme/Allergies: Negative.   Psychiatric/Behavioral: Negative.      Past Medical History:  Diagnosis Date   Acid reflux    Asthma    Diabetes mellitus without complication (HCC)    Hypertension     Social History   Tobacco Use   Smoking status: Never   Smokeless tobacco: Never  Substance Use Topics   Alcohol use:  Never   Drug use: Never    Family History  Problem Relation Age of Onset   Breast cancer Neg Hx      OBJECTIVE: Blood pressure (!) 150/91, pulse 79, temperature 98.2 F (36.8 C), temperature source Oral, resp. rate (!) 24, height 4\' 9"  (1.448 m), weight 100.7 kg, SpO2 99 %.  Physical Exam General appearance: No acute distress, sitting up in the chair HEENT: Moist oral mucous membranes Pulmonary: Normal respiratory effort, clear to auscultation Neck: No bruits, no masses Cardiovascular: Regular rhythm, soft systolic murmur present Extremities: Significant edema bilaterally up to the knees Neuro: Alert oriented Hematology: No acute lesions  Lab Results Lab Results  Component Value Date   WBC 7.5 05/23/2023   HGB 8.7 (L) 05/23/2023   HCT 27.2 (L) 05/23/2023   MCV 91.3 05/23/2023   PLT 294 05/23/2023    Lab Results  Component Value Date   CREATININE 4.06 (H) 05/23/2023   BUN 65 (H) 05/23/2023   NA 143 05/23/2023   K 3.8 05/23/2023   CL 111 05/23/2023   CO2 20 (L) 05/23/2023    Lab Results  Component Value Date   ALT 14 05/23/2023   AST 16 05/23/2023   ALKPHOS 166 (H) 05/23/2023   BILITOT 0.3 05/23/2023     Microbiology: No results found for this or any previous visit (from the past 240 hour(s)).  Medications: Scheduled Meds:  allopurinol  300 mg Oral Daily   aspirin EC  81 mg Oral Daily   carvedilol  6.25 mg Oral  BID AC   furosemide  80 mg Oral Daily   heparin  5,000 Units Subcutaneous Q8H   insulin aspart  0-9 Units Subcutaneous TID WC   loratadine  10 mg Oral Daily   pantoprazole  40 mg Oral Daily   sodium chloride flush  3 mL Intravenous Q12H   Continuous Infusions:  sodium chloride     PRN Meds:.sodium chloride, acetaminophen, ondansetron **OR** ondansetron (ZOFRAN) IV, sodium chloride flush  Allergies  Allergen Reactions   Ace Inhibitors Anaphylaxis   Lisinopril Anaphylaxis   Lipitor [Atorvastatin] Other (See Comments)    Muscle aches     Urinalysis: No results for input(s): "COLORURINE", "LABSPEC", "PHURINE", "GLUCOSEU", "HGBUR", "BILIRUBINUR", "KETONESUR", "PROTEINUR", "UROBILINOGEN", "NITRITE", "LEUKOCYTESUR" in the last 72 hours.  Invalid input(s): "APPERANCEUR"    Imaging: DG Chest 2 View  Result Date: 05/23/2023 CLINICAL DATA:  Shortness of breath. EXAM: CHEST - 2 VIEW COMPARISON:  01/12/2021 FINDINGS: The lungs are clear without focal pneumonia, edema, pneumothorax or pleural effusion. Linear densities of both lung bases are compatible with chronic atelectasis or scarring. The cardio pericardial silhouette is enlarged. No acute bony abnormality. IMPRESSION: Chronic bibasilar atelectasis or scarring. No acute cardiopulmonary findings. Electronically Signed   By: Kennith Center M.D.   On: 05/23/2023 07:47      Assessment/Plan:  Brianna [name] is a 62 y.o. female with medical problems of obesity, asthma, diabetes chronic kidney disease   was admitted on 05/23/2023 for :  Anasarca [R60.1]  Acute kidney injury on chronic kidney disease st 4 versus progression of chronic kidney disease to stage V. CKD risk factors include diabetes, hypertension, obesity, use of nonsteroidals. Will obtain renal ultrasound Will obtain bone and mineral metabolism parameters. Monitor labs to evaluate for renal recovery. No uremic symptoms.  No acute indication for dialysis at present.  Hypertension with lower extremity edema Consider 2D echo for evaluation of cardiac function. Agree with IV furosemide intermittent dosing.  Currently scheduled for 80 mg at 6 PM again today. Monitor urine output closely. Avoid hypotension and monitor electrolyte disturbances in the setting of aggressive diuresis. Blood pressure currently managed with carvedilol. Low-dose calcium channel blocker can be considered Patient is allergic to ACE inhibitors.  Type 2 diabetes with CKD Agree with assessing hemoglobin A1c. Not a candidate for SGLT2  inhibitor due to low GFR at this time.    Trindon Dorton Thedore Mins 05/23/23

## 2023-05-23 NOTE — ED Notes (Signed)
ED TO INPATIENT HANDOFF REPORT  ED Nurse Name and Phone #: Brandon Melnick RN [phone]  S Name/Age/Gender Brianna [name] 62 y.o. female Room/Bed: ED33A/ED33A  Code Status   Code Status: Full Code  Home/SNF/Other Home Patient oriented to: self, place, time, and situation Is this baseline? Yes   Triage Complete: Triage complete  Chief Complaint Anasarca [R60.1]  Triage Note Pt here with SOB x2 weeks. Pt also states a little chest pain. Pt also c/o bilateral knee pain and swelling. Pt denies fall or injury.   Allergies Allergies  Allergen Reactions   Ace Inhibitors Anaphylaxis   Lisinopril Anaphylaxis   Lipitor [Atorvastatin] Other (See Comments)    Muscle aches    Level of Care/Admitting Diagnosis ED Disposition     ED Disposition  Admit   Condition  --   Comment  Hospital Area: [facility] [[id number]]  Level of Care: Telemetry Medical [104]  Covid Evaluation: Confirmed COVID Negative  Diagnosis: Anasarca [454098]  Admitting Physician: Floydene Flock [[id number]]  Attending Physician: Floydene Flock [phone]  Certification:: I certify this patient will need inpatient services for at least 2 midnights  Estimated Length of Stay: 2          B Medical/Surgery History Past Medical History:  Diagnosis Date   Acid reflux    Asthma    Diabetes mellitus without complication (HCC)    Hypertension    Past Surgical History:  Procedure Laterality Date   BOWEL RESECTION  02/09/2020   Procedure: SMALL BOWEL RESECTION;  Surgeon: Henrene Dodge, MD;  Location: [facility] ORS;  Service: General;;   LAPAROTOMY N/A 02/09/2020   Procedure: EXPLORATORY LAPAROTOMY;  Surgeon: Henrene Dodge, MD;  Location: [facility] ORS;  Service: General;  Laterality: N/A;     A IV Location/Drains/Wounds Patient Lines/Drains/Airways Status     Active Line/Drains/Airways     Name Placement date Placement time Site Days   Peripheral IV 05/23/23 20 G Right Antecubital 05/23/23  0837   Antecubital  less than 1            Intake/Output Last 24 hours No intake or output data in the 24 hours ending 05/23/23 1510  Labs/Imaging Results for orders placed or performed during the hospital encounter of 05/23/23 (from the past 48 hour(s))  CBC     Status: Abnormal   Collection Time: 05/23/23  7:17 AM  Result Value Ref Range   WBC 7.5 4.0 - 10.5 K/uL   RBC 2.98 (L) 3.87 - 5.11 MIL/uL   Hemoglobin 8.7 (L) 12.0 - 15.0 g/dL   HCT 47.8 (L) 29.5 - 62.1 %   MCV 91.3 80.0 - 100.0 fL   MCH 29.2 26.0 - 34.0 pg   MCHC 32.0 30.0 - 36.0 g/dL   RDW 30.8 (H) 65.7 - 84.6 %   Platelets 294 150 - 400 K/uL   nRBC 0.0 0.0 - 0.2 %    Comment: Performed at [facility], [address] [place], [place] [address]  Comprehensive metabolic panel     Status: Abnormal   Collection Time: 05/23/23  7:17 AM  Result Value Ref Range   Sodium 143 135 - 145 mmol/L   Potassium 3.8 3.5 - 5.1 mmol/L   Chloride 111 98 - 111 mmol/L   CO2 20 (L) 22 - 32 mmol/L   Glucose, Bld 127 (H) 70 - 99 mg/dL    Comment: Glucose reference range applies only to samples taken after fasting for at least 8 hours.  BUN 65 (H) 8 - 23 mg/dL   Creatinine, Ser 1.61 (H) 0.44 - 1.00 mg/dL   Calcium 8.7 (L) 8.9 - 10.3 mg/dL   Total Protein 7.0 6.5 - 8.1 g/dL   Albumin 3.3 (L) 3.5 - 5.0 g/dL   AST 16 15 - 41 U/L   ALT 14 0 - 44 U/L   Alkaline Phosphatase 166 (H) 38 - 126 U/L   Total Bilirubin 0.3 0.3 - 1.2 mg/dL   GFR, Estimated 12 (L) >60 mL/min    Comment: (NOTE) Calculated using the CKD-EPI Creatinine Equation (2021)    Anion gap 12 5 - 15    Comment: Performed at [facility], [address] [place], [place] [address]  Brain natriuretic peptide     Status: None   Collection Time: 05/23/23  7:17 AM  Result Value Ref Range   B Natriuretic Peptide 30.0 0.0 - 100.0 pg/mL    Comment: Performed at [facility], [address] [place], [place] [address]  D-dimer, quantitative     Status:  Abnormal   Collection Time: 05/23/23  8:35 AM  Result Value Ref Range   D-Dimer, Quant 0.88 (H) 0.00 - 0.50 ug/mL-FEU    Comment: (NOTE) At the manufacturer cut-off value of 0.5 g/mL FEU, this assay has a negative predictive value of 95-100%.This assay is intended for use in conjunction with a clinical pretest probability (PTP) assessment model to exclude pulmonary embolism (PE) and deep venous thrombosis (DVT) in outpatients suspected of PE or DVT. Results should be correlated with clinical presentation. Performed at [facility], [address] [place], [place] [address]   Troponin I (High Sensitivity)     Status: None   Collection Time: 05/23/23  8:38 AM  Result Value Ref Range   Troponin I (High Sensitivity) 14 <18 ng/L    Comment: (NOTE) Elevated high sensitivity troponin I (hsTnI) values and significant  changes across serial measurements may suggest ACS but many other  chronic and acute conditions are known to elevate hsTnI results.  Refer to the "Links" section for chest pain algorithms and additional  guidance. Performed at [facility], [address] [place], [place] [address]   Troponin I (High Sensitivity)     Status: None   Collection Time: 05/23/23  9:30 AM  Result Value Ref Range   Troponin I (High Sensitivity) 14 <18 ng/L    Comment: (NOTE) Elevated high sensitivity troponin I (hsTnI) values and significant  changes across serial measurements may suggest ACS but many other  chronic and acute conditions are known to elevate hsTnI results.  Refer to the "Links" section for chest pain algorithms and additional  guidance. Performed at [facility], [address] [place], [place] [address]   CBG monitoring, ED     Status: Abnormal   Collection Time: 05/23/23  2:55 PM  Result Value Ref Range   Glucose-Capillary 190 (H) 70 - 99 mg/dL    Comment: Glucose reference range applies only to samples taken after fasting for at least 8  hours.   DG Chest 2 View  Result Date: 05/23/2023 CLINICAL DATA:  Shortness of breath. EXAM: CHEST - 2 VIEW COMPARISON:  01/12/2021 FINDINGS: The lungs are clear without focal pneumonia, edema, pneumothorax or pleural effusion. Linear densities of both lung bases are compatible with chronic atelectasis or scarring. The cardio pericardial silhouette is enlarged. No acute bony abnormality. IMPRESSION: Chronic bibasilar atelectasis or scarring. No acute cardiopulmonary findings. Electronically Signed   By:  Kennith Center M.D.   On: 05/23/2023 07:47    Pending Labs Unresulted Labs (From admission, onward)     Start     Ordered   05/24/23 0500  CBC  Tomorrow morning,   STAT        05/23/23 0901   05/24/23 0500  Comprehensive metabolic panel  Tomorrow morning,   STAT        05/23/23 0901   05/23/23 1314  Protein / creatinine ratio, urine  Once,   R        05/23/23 1313   05/23/23 0906  Hemoglobin A1c  Once,   R       Comments: To assess prior glycemic control    05/23/23 0906   05/23/23 0901  HIV Antibody (routine testing w rflx)  (HIV Antibody (Routine testing w reflex) panel)  Once,   R        05/23/23 0901            Vitals/Pain Today's Vitals   05/23/23 0714 05/23/23 1455 05/23/23 1500 05/23/23 1502  BP:  (!) 154/82 (!) 150/91   Pulse:  72 73   Resp:  19 20   Temp:      TempSrc:      SpO2:  100% 99%   Weight: 222 lb 0.1 oz (100.7 kg)     Height: 4\' 9"  (1.448 m)     PainSc:    0-No pain    Isolation Precautions No active isolations  Medications Medications  allopurinol (ZYLOPRIM) tablet 300 mg (300 mg Oral Given 05/23/23 1005)  aspirin EC tablet 81 mg (81 mg Oral Given 05/23/23 1004)  carvedilol (COREG) tablet 6.25 mg (6.25 mg Oral Given 05/23/23 1004)  pantoprazole (PROTONIX) EC tablet 40 mg (40 mg Oral Given 05/23/23 1004)  loratadine (CLARITIN) tablet 10 mg (10 mg Oral Given 05/23/23 1004)  furosemide (LASIX) tablet 80 mg (has no administration in time range)  heparin  injection 5,000 Units (5,000 Units Subcutaneous Given 05/23/23 1507)  sodium chloride flush (NS) 0.9 % injection 3 mL (3 mLs Intravenous Given 05/23/23 1012)  sodium chloride flush (NS) 0.9 % injection 3 mL (has no administration in time range)  0.9 %  sodium chloride infusion (has no administration in time range)  ondansetron (ZOFRAN) tablet 4 mg (has no administration in time range)    Or  ondansetron (ZOFRAN) injection 4 mg (has no administration in time range)  acetaminophen (TYLENOL) tablet 650 mg (has no administration in time range)  insulin aspart (novoLOG) injection 0-9 Units (2 Units Subcutaneous Given 05/23/23 1504)  furosemide (LASIX) injection 80 mg (80 mg Intravenous Given 05/23/23 0857)    Mobility walks     Focused Assessments Cardiac Assessment Handoff:  Cardiac Rhythm: Normal sinus rhythm (HR 74) Lab Results  Component Value Date   TROPONINI < 0.02 04/22/2013   Lab Results  Component Value Date   DDIMER 0.88 (H) 05/23/2023   Does the Patient currently have chest pain? No    R Recommendations: See Admitting Provider Note  Report given to:   Additional Notes: echo done, renal US being done, denies sx, A&Ox4, ambulates w/o assistance, states "staying a few days to get the fluid off"

## 2023-05-23 NOTE — H&P (Signed)
History and Physical    Patient: Brianna [name] ZSW:[id number] DOB: [birth year] DOA: 05/23/2023 DOS: the patient was seen and examined on 05/23/2023 PCP: Emogene Morgan, MD  Patient coming from: Home  Chief Complaint:  Chief Complaint  Patient presents with   Shortness of Breath   Knee Pain   HPI: Brianna [name] is a 62 y.o. female with medical history significant of obesity, asthma, hypertension, type 2 diabetes, stage IV CKD presenting with anasarca, acute on chronic stage IV CKD.  Patient reports worsening shortness of breath over multiple weeks.  Noted to have been admitted back in January 2022 with similar issues including volume overload and edema.  Patient reports taking 80 mg of Lasix on a regular basis.  Still worsening lower extremity swelling.  Positive orthopnea.  No chest pain.  Patient does admit to eating a high salt diet as well as taking BC powders on a regular basis for bilateral knee pain.  No hemiparesis or confusion.  Per the patient, her blood sugars have been well-controlled. Presented to the ER afebrile, blood pressure 150s over 110s, satting well on room air.  White count 7.5, hemoglobin 8.7, platelets 294, creatinine of 4.1, glucose 127. BNP WNL.  Chest x-ray grossly stable apart from enlarged cardiac silhouette. Review of Systems: As mentioned in the history of present illness. All other systems reviewed and are negative. Past Medical History:  Diagnosis Date   Acid reflux    Asthma    Diabetes mellitus without complication (HCC)    Hypertension    Past Surgical History:  Procedure Laterality Date   BOWEL RESECTION  02/09/2020   Procedure: SMALL BOWEL RESECTION;  Surgeon: Henrene Dodge, MD;  Location: [facility] ORS;  Service: General;;   LAPAROTOMY N/A 02/09/2020   Procedure: EXPLORATORY LAPAROTOMY;  Surgeon: Henrene Dodge, MD;  Location: [facility] ORS;  Service: General;  Laterality: N/A;   Social History:  reports that she has never smoked. She has never used  smokeless tobacco. She reports that she does not drink alcohol and does not use drugs.  Allergies  Allergen Reactions   Ace Inhibitors Anaphylaxis   Lisinopril Anaphylaxis   Lipitor [Atorvastatin] Other (See Comments)    Muscle aches    Family History  Problem Relation Age of Onset   Breast cancer Neg Hx     Prior to Admission medications   Medication Sig Start Date End Date Taking? Authorizing Provider  albuterol (PROVENTIL) (2.5 MG/3ML) 0.083% nebulizer solution Take 2.5 mg by nebulization every 4 (four) hours as needed for shortness of breath or wheezing. 12/21/19   [provider]  allopurinol (ZYLOPRIM) 300 MG tablet Take 300 mg by mouth daily. 12/21/19   [provider]  amLODipine (NORVASC) 10 MG tablet Take 10 mg by mouth daily. 12/21/19   [provider]  ASPIRIN ADULT LOW STRENGTH 81 MG EC tablet Take 81 mg by mouth daily. 12/21/19   [provider]  carvedilol (COREG) 6.25 MG tablet Take 6.25 mg by mouth 2 (two) times daily. 01/06/21   [provider]  cetirizine (ZYRTEC) 10 MG tablet Take 10 mg by mouth daily. 09/18/19   [provider]  furosemide (LASIX) 80 MG tablet Take 1 tablet (80 mg total) by mouth daily. For leg swelling or weight gain >5lbs 01/19/21   Rodolph Bong, MD  HUMULIN N 100 UNIT/ML injection Inject 0.2 mLs (20 Units total) into the skin 2 (two) times daily. 02/14/20   Enedina Finner, MD  omeprazole (PRILOSEC) 20  MG capsule Take 20 mg by mouth daily. 12/21/19   [provider]  zolpidem (AMBIEN) 5 MG tablet Take 1 tablet (5 mg total) by mouth at bedtime as needed for sleep. 01/19/21   Rodolph Bong, MD    Physical Exam: Vitals:   05/23/23 0713 05/23/23 0714  BP: (!) 152/117   Pulse: 82   Resp: 18   Temp: 98.2 F (36.8 C)   TempSrc: Oral   SpO2: 93%   Weight:  100.7 kg  Height:  4\' 9"  (1.448 m)   Physical Exam Constitutional:      Appearance: She is obese.  HENT:     Head:  Normocephalic.     Mouth/Throat:     Mouth: Mucous membranes are moist.  Eyes:     Pupils: Pupils are equal, round, and reactive to light.  Cardiovascular:     Rate and Rhythm: Normal rate and regular rhythm.  Pulmonary:     Effort: Pulmonary effort is normal.  Abdominal:     General: Bowel sounds are normal.  Musculoskeletal:     Comments: 3+ pitting edema bilaterally  + generalized body wall edema   Neurological:     General: No focal deficit present.  Psychiatric:        Mood and Affect: Mood normal.     Data Reviewed:  There are no new results to review at this time. DG Chest 2 View CLINICAL DATA:  Shortness of breath.  EXAM: CHEST - 2 VIEW  COMPARISON:  01/12/2021  FINDINGS: The lungs are clear without focal pneumonia, edema, pneumothorax or pleural effusion. Linear densities of both lung bases are compatible with chronic atelectasis or scarring. The cardio pericardial silhouette is enlarged. No acute bony abnormality.  IMPRESSION: Chronic bibasilar atelectasis or scarring. No acute cardiopulmonary findings.  Electronically Signed   By: Kennith Center M.D.   On: 05/23/2023 07:47  Lab Results  Component Value Date   WBC 7.5 05/23/2023   HGB 8.7 (L) 05/23/2023   HCT 27.2 (L) 05/23/2023   MCV 91.3 05/23/2023   PLT 294 05/23/2023   Last metabolic panel Lab Results  Component Value Date   GLUCOSE 127 (H) 05/23/2023   NA 143 05/23/2023   K 3.8 05/23/2023   CL 111 05/23/2023   CO2 20 (L) 05/23/2023   BUN 65 (H) 05/23/2023   CREATININE 4.06 (H) 05/23/2023   GFRNONAA 12 (L) 05/23/2023   CALCIUM 8.7 (L) 05/23/2023   PHOS 3.9 01/19/2021   PROT 7.0 05/23/2023   ALBUMIN 3.3 (L) 05/23/2023   LABGLOB 3.1 01/14/2021   AGRATIO 0.9 01/14/2021   BILITOT 0.3 05/23/2023   ALKPHOS 166 (H) 05/23/2023   AST 16 05/23/2023   ALT 14 05/23/2023   ANIONGAP 12 05/23/2023    Assessment and Plan: * Anasarca Generalized body edema in the setting of acute on chronic  stage IV CKD and diastolic CHF Markedly volume overload Status post IV Lasix in the ER Pending nephrology consult to help with diuretic management BP stable though with enlarged cardiac silhouette on chest x-ray 2D echo Follow volume status  Acute renal failure superimposed on stage 4 chronic kidney disease (HCC) Baseline creatinine around 2-2.8 with GFR's in the 20s Creatinine 4 today with GFR of 12  Noted regular BC powder use and high salt intake  + anasarca  Still making some urine  S/p 80mg  IV lasix in ER  Dr. Thedore Mins w/ nephrology consulted for formal recommendations.    (HFpEF) heart failure with preserved  ejection fraction [facility]) 2D echo January 2022 with a EF of 7075% as well as grade 1 diastolic dysfunction Noted anasarca and volume overload on exam in setting of acute on chronic stage IV CKD BNP stable Positive cardiomegaly on chest x-ray Status post 80 mg IV Lasix in ER Follow diuresis with nephrology recommendations 2D echo Cardiology consult as clinically indicated   Asthma Stable from respiratory standpoint Continue home inhaler  Hypertension BP stable  Titrate home regimen    Type 2 diabetes mellitus without complication, with long-term current use of insulin (HCC) SSI A1c      Advance Care Planning:   Code Status: Full Code   Consults: Dr. Thedore Mins w/ nephrology   Family Communication: No family at the bedside   Severity of Illness: The appropriate patient status for this patient is INPATIENT. Inpatient status is judged to be reasonable and necessary in order to provide the required intensity of service to ensure the patient's safety. The patient's presenting symptoms, physical exam findings, and initial radiographic and laboratory data in the context of their chronic comorbidities is felt to place them at high risk for further clinical deterioration. Furthermore, it is not anticipated that the patient will be medically stable for discharge from the  hospital within 2 midnights of admission.   * I certify that at the point of admission it is my clinical judgment that the patient will require inpatient hospital care spanning beyond 2 midnights from the point of admission due to high intensity of service, high risk for further deterioration and high frequency of surveillance required.*  Author: Floydene Flock, MD 05/23/2023 9:13 AM  For on call review [email or web address].

## 2023-05-23 NOTE — Assessment & Plan Note (Signed)
BP stable Titrate home regimen 

## 2023-05-23 NOTE — ED Provider Notes (Signed)
[facility] Provider Note    Event Date/Time   First MD Initiated Contact with Patient 05/23/23 0745     (approximate)   History   Shortness of Breath and Knee Pain   HPI  Brianna [name] is a 62 y.o. female   Past medical history of diastolic heart failure and CKD who presents emergency department with exertional dyspnea, orthopnea, bilateral lower extremity edema for the past several weeks.  She has been taking her Lasix as prescribed.  She lost touch with her cardiologist and nephrologist after hospitalization for fluid overload and renal failure several years ago when she was diuresed and discharged.  She denies any chest pain, fevers, chills, cough and has no history of blood clot.   External Medical Documents Reviewed: Hospitalization and discharge summary from 2022 when she was admitted for fluid overload/dyspnea and was diuresed after which her kidney function improved markedly      Physical Exam   Triage Vital Signs: ED Triage Vitals  Enc Vitals Group     BP 05/23/23 0713 (!) 152/117     Pulse Rate 05/23/23 0713 82     Resp 05/23/23 0713 18     Temp 05/23/23 0713 98.2 F (36.8 C)     Temp Source 05/23/23 0713 Oral     SpO2 05/23/23 0713 93 %     Weight 05/23/23 0714 222 lb 0.1 oz (100.7 kg)     Height 05/23/23 0714 4\' 9"  (1.448 m)     Head Circumference --      Peak Flow --      Pain Score 05/23/23 0713 10     Pain Loc --      Pain Edu? --      Excl. in GC? --     Most recent vital signs: Vitals:   05/23/23 0713  BP: (!) 152/117  Pulse: 82  Resp: 18  Temp: 98.2 F (36.8 C)  SpO2: 93%    General: Awake, no distress.  CV:  Good peripheral perfusion.  Resp:  Normal effort.  Abd:  No distention.  Other:  Significant bilateral pitting edema to lower extremities, lungs with some faint rales at bases bilaterally.  No respiratory distress.  No hypoxemia.  Hypertensive.   ED Results / Procedures / Treatments   Labs (all  labs ordered are listed, but only abnormal results are displayed) Labs Reviewed  CBC - Abnormal; Notable for the following components:      Result Value   RBC 2.98 (*)    Hemoglobin 8.7 (*)    HCT 27.2 (*)    RDW 16.0 (*)    All other components within normal limits  COMPREHENSIVE METABOLIC PANEL - Abnormal; Notable for the following components:   CO2 20 (*)    Glucose, Bld 127 (*)    BUN 65 (*)    Creatinine, Ser 4.06 (*)    Calcium 8.7 (*)    Albumin 3.3 (*)    Alkaline Phosphatase 166 (*)    GFR, Estimated 12 (*)    All other components within normal limits  BRAIN NATRIURETIC PEPTIDE  D-DIMER, QUANTITATIVE  TROPONIN I (HIGH SENSITIVITY)     I ordered and reviewed the above labs they are notable for normal white blood cell count, H&H is slightly decreased from prior several years ago, creatinine is 4 from baseline of 2  EKG  ED ECG REPORT I, Pilar Jarvis, the attending physician, personally viewed and interpreted this ECG.   Date:  05/23/2023  EKG Time: 0719  Rate: 75  Rhythm: sinus  Axis: nl  Intervals: none  ST&T Change: No STEMI, single PVC    RADIOLOGY I independently reviewed and interpreted chest x-ray and see no obvious focalities or pneumothorax   PROCEDURES:  Critical Care performed: No  Procedures   MEDICATIONS ORDERED IN ED: Medications  furosemide (LASIX) injection 80 mg (has no administration in time range)    External physician / consultants:  I spoke with specialist for admission and regarding care plan for this patient.   IMPRESSION / MDM / ASSESSMENT AND PLAN / ED COURSE  I reviewed the triage vital signs and the nursing notes.                                Patient's presentation is most consistent with acute presentation with potential threat to life or bodily function.  Differential diagnosis includes, but is not limited to, pulmonary edema, fluid overload, acute kidney failure, ACS, PE   The patient is on the cardiac monitor  to evaluate for evidence of arrhythmia and/or significant heart rate changes.  MDM: Is a patient who appears hypervolemic with pitting edema to bilateral lower extremities and orthopnea/exertional dyspnea with a history of diastolic heart failure and kidney disease who was hospitalized for a very similar presentation a few years ago but lost to follow-up.  I will order Lasix 80 mg IV to begin her diuresis today.  She will require admission for her elevated creatinine and fluid overload.  I considered PE but the think this is less likely in the setting of clinical hypervolemia, though it is curious that her BNP is normal.  Will start with dimer as she cannot do a IV contrast enhanced CT angiogram due to her poor kidney function.  Fortunately her hemodynamics are appropriate and reassuring at this time so I do not think she has a massive PE and I think my clinical suspicion for PE is low.  No chest pain so I doubt ACS.       FINAL CLINICAL IMPRESSION(S) / ED DIAGNOSES   Final diagnoses:  Acute on chronic congestive heart failure, unspecified heart failure type (HCC)  AKI (acute kidney injury) (HCC)     Rx / DC Orders   ED Discharge Orders     None        Note:  This document was prepared using Dragon voice recognition software and may include unintentional dictation errors.    Pilar Jarvis, MD 05/23/23 0830

## 2023-05-23 NOTE — ED Notes (Signed)
Pt alert, NAD, calm, interactive, resps e/u, tolerating renal US well, denies pain, sob, nausea or dizziness. VSS.

## 2023-05-23 NOTE — ED Notes (Signed)
Renal US at BS °

## 2023-05-23 NOTE — Consult Note (Signed)
Cardiology Consult    Patient ID: Brianna [name] MRN: [id number], DOB/AGE: Jan 11, 1961   Admit date: 05/23/2023 Date of Consult: 05/23/2023  Primary Physician: Emogene Morgan, MD Primary Cardiologist: Julien Nordmann, MD - new Requesting Provider: S. Alvester Morin, MD  Patient Profile    Brianna [name] is a 62 y.o. female with a history of obesity, asthma, HTN, DMII, CKD IV, anasarca, who is being seen today for the evaluation of CHF at the request of Dr. Alvester Morin.  Past Medical History   Past Medical History:  Diagnosis Date   Acid reflux    Anasarca    Asthma    Chronic heart failure with preserved ejection fraction (HFpEF) (HCC)    a. 12/2020 Echo: EF 70-75%, no rwma, mild LVH, GrI DD, nl RV fxn, triv MR/AI.   CKD (chronic kidney disease), stage IV (HCC)    Diabetes mellitus without complication (HCC)    Hypertension    Morbid obesity (HCC)     Past Surgical History:  Procedure Laterality Date   BOWEL RESECTION  02/09/2020   Procedure: SMALL BOWEL RESECTION;  Surgeon: Henrene Dodge, MD;  Location: [facility] ORS;  Service: General;;   LAPAROTOMY N/A 02/09/2020   Procedure: EXPLORATORY LAPAROTOMY;  Surgeon: Henrene Dodge, MD;  Location: [facility] ORS;  Service: General;  Laterality: N/A;     Allergies  Allergies  Allergen Reactions   Ace Inhibitors Anaphylaxis   Lisinopril Anaphylaxis   Lipitor [Atorvastatin] Other (See Comments)    Muscle aches    History of Present Illness    62 year old female with a history of stage IV chronic kidney disease, diabetes, hypertension, obesity, asthma, and GERD.  She lives locally and works at [employer].  She does not routinely exercise.  She admits to diet fairly high in salt and will often eat [employer].  She was in her usual state of health until about 3 weeks ago, when she started noticing increasing dyspnea on exertion with lower extremity edema and some increase in abdominal girth.  Over the past week, symptoms are much worse.  She denies  PND or orthopnea.  She presented to the emergency department today due to progressive dyspnea.  On arrival, BP 152/117. ECG RSR @ 75 w/ prior inferior/anterolateral infarcts.  CXR w/o acute cardiopulmonary dzs.  H/H 8.7/27.2.  BUN/Creat 65/4.06.  HsTrop 14  14.  She has significant lower extremity edema on examination and was treated with IV Lasix in the ED.  So far, she notes good response.  She is asymptomatic at rest.  We have been asked to evaluate.  Inpatient Medications     allopurinol  300 mg Oral Daily   aspirin EC  81 mg Oral Daily   carvedilol  6.25 mg Oral BID AC   furosemide  80 mg Oral Daily   heparin  5,000 Units Subcutaneous Q8H   insulin aspart  0-9 Units Subcutaneous TID WC   loratadine  10 mg Oral Daily   pantoprazole  40 mg Oral Daily   sodium chloride flush  3 mL Intravenous Q12H    Family History    Family History  Problem Relation Age of Onset   Breast cancer Neg Hx    She indicated that her mother is deceased. She indicated that her father is deceased. She indicated that the status of her neg hx is unknown.   Social History    Social History   Socioeconomic History   Marital status: Single    Spouse name: Not on file  Number of children: Not on file   Years of education: Not on file   Highest education level: Not on file  Occupational History   Not on file  Tobacco Use   Smoking status: Never   Smokeless tobacco: Never  Substance and Sexual Activity   Alcohol use: Never   Drug use: Never   Sexual activity: Not Currently  Other Topics Concern   Not on file  Social History Narrative   Not on file   Social Determinants of Health   Financial Resource Strain: Not on file  Food Insecurity: Not on file  Transportation Needs: Not on file  Physical Activity: Not on file  Stress: Not on file  Social Connections: Not on file  Intimate Partner Violence: Not on file     Review of Systems    General:  No chills, fever, night sweats or weight  changes.  Cardiovascular:  No chest pain, +++ dyspnea on exertion, +++ LE edema, no orthopnea, palpitations, paroxysmal nocturnal dyspnea. Dermatological: No rash, lesions/masses Respiratory: No cough, +++ dyspnea Urologic: No hematuria, dysuria Abdominal:   No nausea, vomiting, diarrhea, bright red blood per rectum, melena, or hematemesis Neurologic:  No visual changes, wkns, changes in mental status. All other systems reviewed and are otherwise negative except as noted above.  Physical Exam    Blood pressure (!) 150/91, pulse 71, temperature 98.2 F (36.8 C), temperature source Oral, resp. rate (!) 23, height 4\' 9"  (1.448 m), weight 100.7 kg, SpO2 100 %.  General: Pleasant, NAD Psych: Normal affect. Neuro: Alert and oriented X 3. Moves all extremities spontaneously. HEENT: Normal  Neck: Supple, obese, difficult to gauge JVP.  No bruits. Lungs:  Resp regular and unlabored, CTA. Heart: RRR no s3, s4, or murmurs. Abdomen: Obese, soft, non-tender, non-distended, BS + x 4.  Extremities: No clubbing, cyanosis.  2-3+ bilateral lower extremity edema to the knees. DP/PT2+, Radials 2+ and equal bilaterally.  Labs    Cardiac Enzymes Recent Labs  Lab 05/23/23 0838 05/23/23 0930  TROPONINIHS 14 14     BNP    Component Value Date/Time   BNP 30.0 05/23/2023 0717   Lab Results  Component Value Date   WBC 7.5 05/23/2023   HGB 8.7 (L) 05/23/2023   HCT 27.2 (L) 05/23/2023   MCV 91.3 05/23/2023   PLT 294 05/23/2023    Recent Labs  Lab 05/23/23 0717  NA 143  K 3.8  CL 111  CO2 20*  BUN 65*  CREATININE 4.06*  CALCIUM 8.7*  PROT 7.0  BILITOT 0.3  ALKPHOS 166*  ALT 14  AST 16  GLUCOSE 127*    Lab Results  Component Value Date   DDIMER 0.88 (H) 05/23/2023      Radiology Studies    US RENAL  Result Date: 05/23/2023 CLINICAL DATA:  Acute kidney injury. EXAM: RENAL / URINARY TRACT ULTRASOUND COMPLETE COMPARISON:  01/13/2021 FINDINGS: Right Kidney: Renal measurements:  9.6 x 6.2 x 6.3 cm = volume: 196.1 mL. Increased parenchymal echogenicity. No mass or hydronephrosis. Left Kidney: Renal measurements: 10.0 x 5.8 x 5.3 cm = volume: 161.4 mL. Increased parenchymal echogenicity. No mass or hydronephrosis. Bladder: Appears normal for degree of bladder distention. Other: None. IMPRESSION: 1. No acute findings. No hydronephrosis. 2. Increased renal parenchymal echogenicity compatible with chronic medical renal disease. Electronically Signed   By: Signa Kell M.D.   On: 05/23/2023 16:16   DG Chest 2 View  Result Date: 05/23/2023 CLINICAL DATA:  Shortness of breath. EXAM: CHEST -  2 VIEW COMPARISON:  01/12/2021 FINDINGS: The lungs are clear without focal pneumonia, edema, pneumothorax or pleural effusion. Linear densities of both lung bases are compatible with chronic atelectasis or scarring. The cardio pericardial silhouette is enlarged. No acute bony abnormality. IMPRESSION: Chronic bibasilar atelectasis or scarring. No acute cardiopulmonary findings. Electronically Signed   By: Kennith Center M.D.   On: 05/23/2023 07:47    ECG & Cardiac Imaging    RSR, 75, LAD, PVC, inferior infarct, anterolateral infarct  - Personally Reviewed   Assessment & Plan    1.  Acute on chronic heart failure with preserved ejection fraction: Hyperdynamic LV function by echo in January 2022.  Admitted with a 3-week history of progressive dyspnea on exertion and lower extremity edema.  Hypertensive on arrival.  ECG without acute ST or T changes.  Troponins are normal.  Chest x-ray without acute cardiopulmonary findings.  She does have bilateral lower extremity edema.  Body habitus makes exam challenging.  An echocardiogram was performed this morning however, images not yet available for review.  She received Lasix 80 mg IV x 1 in the ED and is currently scheduled to receive 80 mg orally.  In the setting of CKD 4 with a creatinine of 4.06, defer Lasix dosing to nephrology.  Continue beta-blocker.   Poor candidate for MRA/SGLT2 inhibitor in the setting of renal failure.  Will add hydralazine/nitrate in the setting of ongoing hypertension.  Further recommendations pending echo.  2.  Essential hypertension: Currently elevated.  On carvedilol and Lasix.  Will add hydralazine/nitrate for afterload reduction.  3.  Type 2 diabetes mellitus: Management per primary team.  4.  Stage IV chronic kidney disease: Nephrology consult pending.  Risk Assessment/Risk Scores:        New York Heart Association (NYHA) Functional Class NYHA Class III    Signed, Nicolasa Ducking, NP 05/23/2023, 4:25 PM  For questions or updates, please contact   Please consult [email or web address] for contact info under Cardiology/STEMI.

## 2023-05-23 NOTE — ED Notes (Signed)
New to POD C. Ambulatory within room. Pt alert, NAD, calm, interactive, resps e/u, sitting on edge of bed. Echo arriving to [facility] now for exam.

## 2023-05-23 NOTE — Assessment & Plan Note (Signed)
2D echo January 2022 with a EF of 7075% as well as grade 1 diastolic dysfunction Noted anasarca and volume overload on exam in setting of acute on chronic stage IV CKD BNP stable Positive cardiomegaly on chest x-ray Status post 80 mg IV Lasix in ER Follow diuresis with nephrology recommendations 2D echo Cardiology consult as clinically indicated

## 2023-05-23 NOTE — Assessment & Plan Note (Signed)
Baseline creatinine around 2-2.8 with GFR's in the 20s Creatinine 4 today with GFR of 12  Noted regular BC powder use and high salt intake  + anasarca  Still making some urine  S/p 80mg  IV lasix in ER  Dr. Thedore Mins w/ nephrology consulted for formal recommendations.

## 2023-05-24 DIAGNOSIS — R601 Generalized edema: Secondary | ICD-10-CM | POA: Diagnosis not present

## 2023-05-24 DIAGNOSIS — I509 Heart failure, unspecified: Secondary | ICD-10-CM

## 2023-05-24 LAB — COMPREHENSIVE METABOLIC PANEL
ALT: 13 U/L (ref 0–44)
AST: 14 U/L — ABNORMAL LOW (ref 15–41)
Albumin: 2.7 g/dL — ABNORMAL LOW (ref 3.5–5.0)
Alkaline Phosphatase: 145 U/L — ABNORMAL HIGH (ref 38–126)
Anion gap: 10 (ref 5–15)
BUN: 67 mg/dL — ABNORMAL HIGH (ref 8–23)
CO2: 22 mmol/L (ref 22–32)
Calcium: 8.5 mg/dL — ABNORMAL LOW (ref 8.9–10.3)
Chloride: 109 mmol/L (ref 98–111)
Creatinine, Ser: 4.22 mg/dL — ABNORMAL HIGH (ref 0.44–1.00)
GFR, Estimated: 11 mL/min — ABNORMAL LOW (ref >60)
Glucose, Bld: 159 mg/dL — ABNORMAL HIGH (ref 70–99)
Potassium: 3.6 mmol/L (ref 3.5–5.1)
Sodium: 141 mmol/L (ref 135–145)
Total Bilirubin: 0.4 mg/dL (ref 0.3–1.2)
Total Protein: 5.9 g/dL — ABNORMAL LOW (ref 6.5–8.1)

## 2023-05-24 LAB — CBC
HCT: 24.3 % — ABNORMAL LOW (ref 36.0–46.0)
Hemoglobin: 7.7 g/dL — ABNORMAL LOW (ref 12.0–15.0)
MCH: 28.7 pg (ref 26.0–34.0)
MCHC: 31.7 g/dL (ref 30.0–36.0)
MCV: 90.7 fL (ref 80.0–100.0)
Platelets: 248 10*3/uL (ref 150–400)
RBC: 2.68 MIL/uL — ABNORMAL LOW (ref 3.87–5.11)
RDW: 15.3 % (ref 11.5–15.5)
WBC: 6.3 10*3/uL (ref 4.0–10.5)
nRBC: 0 % (ref 0.0–0.2)

## 2023-05-24 LAB — GLUCOSE, CAPILLARY
Glucose-Capillary: 113 mg/dL — ABNORMAL HIGH (ref 70–99)
Glucose-Capillary: 126 mg/dL — ABNORMAL HIGH (ref 70–99)
Glucose-Capillary: 148 mg/dL — ABNORMAL HIGH (ref 70–99)

## 2023-05-24 LAB — HEMOGLOBIN A1C
Hgb A1c MFr Bld: 6.3 % — ABNORMAL HIGH (ref 4.8–5.6)
Mean Plasma Glucose: 134 mg/dL

## 2023-05-24 MED ORDER — FUROSEMIDE 10 MG/ML IJ SOLN
60.0000 mg | Freq: Once | INTRAMUSCULAR | Status: AC
  Administered 2023-05-24: 60 mg via INTRAVENOUS
  Filled 2023-05-24: qty 8

## 2023-05-24 MED ORDER — ISOSORBIDE MONONITRATE ER 30 MG PO TB24
30.0000 mg | ORAL_TABLET | Freq: Two times a day (BID) | ORAL | Status: DC
  Administered 2023-05-24 – 2023-05-31 (×14): 30 mg via ORAL
  Filled 2023-05-24 (×14): qty 1

## 2023-05-24 MED ORDER — ALLOPURINOL 100 MG PO TABS
100.0000 mg | ORAL_TABLET | Freq: Every day | ORAL | Status: DC
  Administered 2023-05-25 – 2023-05-31 (×7): 100 mg via ORAL
  Filled 2023-05-24 (×8): qty 1

## 2023-05-24 NOTE — Progress Notes (Signed)
[facility] [place], [place] 05/24/23  Subjective:   Hospital day # 1  Sitting up in the bed.  Denies any acute complaints. Wondering about her furosemide dose from this morning. Continues to have some lower extremity edema although improved. No shortness of breath. No problems reported with appetite. Serum creatinine is higher today. Renal: 05/23 0701 - 05/24 0700 In: 720 [P.O.:720] Out: 400 [Urine:400] Lab Results  Component Value Date   CREATININE 4.22 (H) 05/24/2023   CREATININE 4.06 (H) 05/23/2023   CREATININE 2.26 (H) 01/19/2021     Objective:  Vital signs in last 24 hours:  Temp:  [97.9 F (36.6 C)-98.2 F (36.8 C)] 98.2 F (36.8 C) (05/24 0354) Pulse Rate:  [71-79] 72 (05/24 0354) Resp:  [18-24] 18 (05/24 0354) BP: (146-163)/(78-92) 163/85 (05/24 0557) SpO2:  [98 %-100 %] 100 % (05/24 0354)  Weight change:  Filed Weights   05/23/23 0714  Weight: 100.7 kg    Intake/Output:    Intake/Output Summary (Last 24 hours) at 05/24/2023 1347 Last data filed at 05/24/2023 1002 Gross per 24 hour  Intake 900 ml  Output 400 ml  Net 500 ml     Physical Exam: General: No acute distress, sitting up in bed  HEENT Moist oral mucous membranes  Pulm/lungs Normal breathing effort on room air, clear to auscultation  CVS/Heart Regular, no rub  Abdomen:  Soft, obese  Extremities: 2+ pitting edema bilaterally  Neurologic: Alert, oriented  Skin: Warm, dry          Basic Metabolic Panel:  Recent Labs  Lab 05/23/23 0717 05/24/23 0430  NA 143 141  K 3.8 3.6  CL 111 109  CO2 20* 22  GLUCOSE 127* 159*  BUN 65* 67*  CREATININE 4.06* 4.22*  CALCIUM 8.7* 8.5*     CBC: Recent Labs  Lab 05/23/23 0717 05/24/23 0430  WBC 7.5 6.3  HGB 8.7* 7.7*  HCT 27.2* 24.3*  MCV 91.3 90.7  PLT 294 248      Lab Results  Component Value Date   HEPBSAG NON REACTIVE 01/14/2021   HEPBSAB Reactive (A) 01/14/2021      Microbiology:  No results  found for this or any previous visit (from the past 240 hour(s)).  Coagulation Studies: No results for input(s): "LABPROT", "INR" in the last 72 hours.  Urinalysis: No results for input(s): "COLORURINE", "LABSPEC", "PHURINE", "GLUCOSEU", "HGBUR", "BILIRUBINUR", "KETONESUR", "PROTEINUR", "UROBILINOGEN", "NITRITE", "LEUKOCYTESUR" in the last 72 hours.  Invalid input(s): "APPERANCEUR"    Imaging: ECHOCARDIOGRAM COMPLETE  Result Date: 05/23/2023    ECHOCARDIOGRAM REPORT   Patient Name:   Brianna [name] Date of Exam: 05/23/2023 Medical Rec #:  [id number]           Height:       57.0 in Accession #:    [id number]          Weight:       222.0 lb Date of Birth:  1961-11-27           BSA:          1.880 m Patient Age:    61 years            BP:           152/117 mmHg Patient Gender: F                   HR:           82 bpm. Exam Location:  [facility] Procedure: 2D Echo,  Color Doppler and Cardiac Doppler Indications:     CHF-acute diastolic I50.31  History:         Patient has prior history of Echocardiogram examinations, most                  recent 01/12/2021.  Sonographer:     Cristela Blue Referring Phys:  [id number] Francoise Schaumann [place] Diagnosing Phys: Julien Nordmann MD IMPRESSIONS  1. Left ventricular ejection fraction, by estimation, is 60 to 65%. The left ventricle has normal function. The left ventricle has no regional wall motion abnormalities. Left ventricular diastolic parameters are consistent with Grade I diastolic dysfunction (impaired relaxation).  2. Right ventricular systolic function is normal. The right ventricular size is normal. There is normal pulmonary artery systolic pressure. The estimated right ventricular systolic pressure is 20.5 mmHg.  3. The mitral valve is normal in structure. No evidence of mitral valve regurgitation. No evidence of mitral stenosis.  4. The aortic valve has an indeterminant number of cusps. There is mild calcification of the aortic valve. Aortic valve regurgitation is not  visualized. Aortic valve sclerosis is present, with no evidence of aortic valve stenosis.  5. The inferior vena cava is normal in size with greater than 50% respiratory variability, suggesting right atrial pressure of 3 mmHg. FINDINGS  Left Ventricle: Left ventricular ejection fraction, by estimation, is 60 to 65%. The left ventricle has normal function. The left ventricle has no regional wall motion abnormalities. The left ventricular internal cavity size was normal in size. There is  no left ventricular hypertrophy. Left ventricular diastolic parameters are consistent with Grade I diastolic dysfunction (impaired relaxation). Right Ventricle: The right ventricular size is normal. No increase in right ventricular wall thickness. Right ventricular systolic function is normal. There is normal pulmonary artery systolic pressure. The tricuspid regurgitant velocity is 1.97 m/s, and  with an assumed right atrial pressure of 5 mmHg, the estimated right ventricular systolic pressure is 20.5 mmHg. Left Atrium: Left atrial size was normal in size. Right Atrium: Right atrial size was normal in size. Pericardium: There is no evidence of pericardial effusion. Mitral Valve: The mitral valve is normal in structure. Mild mitral annular calcification. No evidence of mitral valve regurgitation. No evidence of mitral valve stenosis. MV peak gradient, 5.3 mmHg. The mean mitral valve gradient is 3.0 mmHg. Tricuspid Valve: The tricuspid valve is normal in structure. Tricuspid valve regurgitation is not demonstrated. No evidence of tricuspid stenosis. Aortic Valve: The aortic valve has an indeterminant number of cusps. There is mild calcification of the aortic valve. Aortic valve regurgitation is not visualized. Aortic valve sclerosis is present, with no evidence of aortic valve stenosis. Aortic valve  mean gradient measures 6.0 mmHg. Aortic valve peak gradient measures 11.0 mmHg. Aortic valve area, by VTI measures 2.55 cm. Pulmonic  Valve: The pulmonic valve was normal in structure. Pulmonic valve regurgitation is not visualized. No evidence of pulmonic stenosis. Aorta: The aortic root is normal in size and structure. Venous: The inferior vena cava is normal in size with greater than 50% respiratory variability, suggesting right atrial pressure of 3 mmHg. IAS/Shunts: No atrial level shunt detected by color flow Doppler.  LEFT VENTRICLE PLAX 2D LVIDd:         4.70 cm   Diastology LVIDs:         2.80 cm   LV e' medial:    5.66 cm/s LV PW:         1.30 cm   LV E/e' medial:  16.4 LV IVS:        1.20 cm   LV e' lateral:   9.46 cm/s LVOT diam:     2.00 cm   LV E/e' lateral: 9.8 LV SV:         77 LV SV Index:   41 LVOT Area:     3.14 cm  RIGHT VENTRICLE RV Basal diam:  4.30 cm RV Mid diam:    3.40 cm RV S prime:     14.70 cm/s TAPSE (M-mode): 3.3 cm LEFT ATRIUM             Index        RIGHT ATRIUM           Index LA diam:        4.10 cm 2.18 cm/m   RA Area:     13.50 cm LA Vol (A2C):   45.0 ml 23.93 ml/m  RA Volume:   32.80 ml  17.44 ml/m LA Vol (A4C):   32.3 ml 17.18 ml/m LA Biplane Vol: 37.7 ml 20.05 ml/m  AORTIC VALVE AV Area (Vmax):    1.88 cm AV Area (Vmean):   2.14 cm AV Area (VTI):     2.55 cm AV Vmax:           165.50 cm/s AV Vmean:          114.500 cm/s AV VTI:            0.302 m AV Peak Grad:      11.0 mmHg AV Mean Grad:      6.0 mmHg LVOT Vmax:         99.00 cm/s LVOT Vmean:        78.000 cm/s LVOT VTI:          0.246 m LVOT/AV VTI ratio: 0.81  AORTA Ao Root diam: 3.20 cm MITRAL VALVE                TRICUSPID VALVE MV Area (PHT): 2.54 cm     TR Peak grad:   15.5 mmHg MV Area VTI:   2.59 cm     TR Vmax:        197.00 cm/s MV Peak grad:  5.3 mmHg MV Mean grad:  3.0 mmHg     SHUNTS MV Vmax:       1.15 m/s     Systemic VTI:  0.25 m MV Vmean:      78.5 cm/s    Systemic Diam: 2.00 cm MV Decel Time: 299 msec MV E velocity: 92.80 cm/s MV A velocity: 106.00 cm/s MV E/A ratio:  0.88 Julien Nordmann MD Electronically signed by Julien Nordmann MD Signature Date/Time: 05/23/2023/5:34:46 PM    Final    US RENAL  Result Date: 05/23/2023 CLINICAL DATA:  Acute kidney injury. EXAM: RENAL / URINARY TRACT ULTRASOUND COMPLETE COMPARISON:  01/13/2021 FINDINGS: Right Kidney: Renal measurements: 9.6 x 6.2 x 6.3 cm = volume: 196.1 mL. Increased parenchymal echogenicity. No mass or hydronephrosis. Left Kidney: Renal measurements: 10.0 x 5.8 x 5.3 cm = volume: 161.4 mL. Increased parenchymal echogenicity. No mass or hydronephrosis. Bladder: Appears normal for degree of bladder distention. Other: None. IMPRESSION: 1. No acute findings. No hydronephrosis. 2. Increased renal parenchymal echogenicity compatible with chronic medical renal disease. Electronically Signed   By: Signa Kell M.D.   On: 05/23/2023 16:16   DG Chest 2 View  Result Date: 05/23/2023 CLINICAL DATA:  Shortness of breath. EXAM: CHEST - 2 VIEW COMPARISON:  01/12/2021  FINDINGS: The lungs are clear without focal pneumonia, edema, pneumothorax or pleural effusion. Linear densities of both lung bases are compatible with chronic atelectasis or scarring. The cardio pericardial silhouette is enlarged. No acute bony abnormality. IMPRESSION: Chronic bibasilar atelectasis or scarring. No acute cardiopulmonary findings. Electronically Signed   By: Kennith Center M.D.   On: 05/23/2023 07:47     Medications:    sodium chloride      allopurinol  300 mg Oral Daily   aspirin EC  81 mg Oral Daily   carvedilol  6.25 mg Oral BID AC   furosemide  80 mg Oral Daily   heparin  5,000 Units Subcutaneous Q8H   hydrALAZINE  25 mg Oral Q8H   insulin aspart  0-9 Units Subcutaneous TID WC   isosorbide mononitrate  30 mg Oral Daily   loratadine  10 mg Oral Daily   pantoprazole  40 mg Oral Daily   sodium chloride flush  3 mL Intravenous Q12H   sodium chloride, acetaminophen, ondansetron **OR** ondansetron (ZOFRAN) IV, sodium chloride flush  Assessment/ Plan:  62 y.o. female with medical problems of  obesity, asthma, diabetes, chronic kidney disease, hypertension   admitted on 05/23/2023 for Anasarca [R60.1] AKI (acute kidney injury) (HCC) [N17.9] Acute on chronic congestive heart failure, unspecified heart failure type (HCC) [I50.9]  Acute kidney injury on chronic kidney disease st 4 versus progression of chronic kidney disease to stage V. CKD risk factors include diabetes, hypertension, obesity, use of nonsteroidals. Will obtain renal ultrasound Will obtain bone and mineral metabolism parameters. Monitor labs to evaluate for renal recovery. No uremic symptoms.  No acute indication for dialysis at present. Avoid nonsteroidals and IV contrast We discussed possibility of needing renal replacement therapy/dialysis in future.   Hypertension with lower extremity edema Agree with IV furosemide intermittent dosing.  Currently scheduled for 80 mg x 1 today Also starting 80 mg Lasix p.o. daily Monitor urine output closely. Avoid hypotension and monitor electrolyte disturbances in the setting of aggressive diuresis. Blood pressure currently managed with carvedilol.  Hydralazine and isosorbide were added by cardiology team. Patient is allergic to ACE inhibitors.   Type 2 diabetes with CKD Agree with assessing hemoglobin A1c. Not a candidate for SGLT2 inhibitor due to low GFR at this time.  Anemia of chronic kidney disease Hemoglobin down to 7.7. Will check iron studies.  Patient may need IV iron and/or ESA.     LOS: 1 Brianna [name] Thedore Mins 5/24/20241:47 PM  [facility] [address] [place], [place] [phone]  Note: This note was prepared with Dragon dictation. Any transcription errors are unintentional

## 2023-05-24 NOTE — Progress Notes (Signed)
Progress Note   Patient: Brianna [name] ZOX:[id number] DOB: 1961-02-20 DOA: 05/23/2023     1 DOS: the patient was seen and examined on 05/24/2023     Subjective:  Patient seen and examined at bedside this morning Tells me swelling is significantly better Have been seen by cardiologist as well as nephrologist Currently diuresing well Denies nausea vomiting chest pain cough or urinary complaints  Brief hospital course: From HPI "Brianna [name] is a 62 y.o. female with medical history significant of obesity, asthma, hypertension, type 2 diabetes, stage IV CKD presenting with anasarca, acute on chronic stage IV CKD.  Patient reports worsening shortness of breath over multiple weeks.  Noted to have been admitted back in January 2022 with similar issues including volume overload and edema.  Patient reports taking 80 mg of Lasix on a regular basis.  Still worsening lower extremity swelling.  Positive orthopnea.  No chest pain.  Patient does admit to eating a high salt diet as well as taking BC powders on a regular basis for bilateral knee pain.  No hemiparesis or confusion.  Per the patient, her blood sugars have been well-controlled. Presented to the ER afebrile, blood pressure 150s over 110s, satting well on room air.  White count 7.5, hemoglobin 8.7, platelets 294, creatinine of 4.1, glucose 127. BNP WNL.  Chest x-ray grossly stable apart from enlarged cardiac silhouette."  Assessment and Plan: * Anasarca Generalized body edema in the setting of acute on chronic stage IV CKD and acute on chronic diastolic CHF Volume overload improving with diuresis Continue Lasix Allergist on board we appreciate input Echo done in this hospitalization shows ejection fraction 60 to 65%   Acute renal failure superimposed on stage 4 chronic kidney disease (HCC) Baseline creatinine around 2-2.8 with GFR's in the 20s Creatinine 4 today with GFR of 12  Noted regular BC powder use and high salt intake  +  anasarca  Still making some urine  S/p 80mg  IV lasix in ER  Dr. Thedore Mins w/ nephrology consulted for formal recommendations.      (HFpEF) heart failure with preserved ejection fraction [facility]) 2D echo January 2022 with a EF of 7075% as well as grade 1 diastolic dysfunction Noted anasarca and volume overload on exam in setting of acute on chronic stage IV CKD BNP stable Positive cardiomegaly on chest x-ray Continue diuresis as recommended by nephrologist Cardiologist on board we appreciate input     Asthma Stable from respiratory standpoint Continue home inhaler   Hypertension BP stable  Titrate home regimen      Type 2 diabetes mellitus without complication, with long-term current use of insulin (HCC) SSI A1c      Advance Care Planning:   Code Status: Full Code    Consults: Dr. Thedore Mins w/ nephrology    Family Communication: No family at the bedside       Physical Exam: Appearance: She is obese.  HENT:     Head: Normocephalic.     Mouth/Throat:     Mouth: Mucous membranes are moist.  Eyes:     Pupils: Pupils are equal, round, and reactive to light.  Cardiovascular:     Rate and Rhythm: Normal rate and regular rhythm.  Pulmonary:     Effort: Pulmonary effort is normal.  Abdominal:     General: Bowel sounds are normal.  Musculoskeletal:     Comments: 2+ pitting edema bilaterally  + generalized body wall edema   Neurological:     General: No focal deficit  present.  Psychiatric:        Mood and Affect: Mood normal.  Vitals:   05/23/23 1651 05/23/23 2024 05/24/23 0354 05/24/23 0557  BP: (!) 156/92 (!) 155/78 (!) 146/83 (!) 163/85  Pulse: 75 78 72   Resp: 20 18 18    Temp: 97.9 F (36.6 C) 98.1 F (36.7 C) 98.2 F (36.8 C)   TempSrc:  Oral Oral   SpO2: 99% 98% 100%   Weight:      Height:        Data Reviewed: I have reviewed patient's laboratory results showing sodium 141 potassium 3.6 creatinine 4.2 hemoglobin 7.7  Time spent: 50  minutes  Author: Loyce Dys, MD 05/24/2023 3:13 PM  For on call review [email or web address].

## 2023-05-25 DIAGNOSIS — R601 Generalized edema: Secondary | ICD-10-CM | POA: Diagnosis not present

## 2023-05-25 LAB — CBC WITH DIFFERENTIAL/PLATELET
Abs Immature Granulocytes: 0.04 10*3/uL (ref 0.00–0.07)
Basophils Absolute: 0 10*3/uL (ref 0.0–0.1)
Basophils Relative: 0 %
Eosinophils Absolute: 0.1 10*3/uL (ref 0.0–0.5)
Eosinophils Relative: 1 %
HCT: 24.2 % — ABNORMAL LOW (ref 36.0–46.0)
Hemoglobin: 7.7 g/dL — ABNORMAL LOW (ref 12.0–15.0)
Immature Granulocytes: 1 %
Lymphocytes Relative: 30 %
Lymphs Abs: 2.2 10*3/uL (ref 0.7–4.0)
MCH: 28.7 pg (ref 26.0–34.0)
MCHC: 31.8 g/dL (ref 30.0–36.0)
MCV: 90.3 fL (ref 80.0–100.0)
Monocytes Absolute: 0.5 10*3/uL (ref 0.1–1.0)
Monocytes Relative: 6 %
Neutro Abs: 4.6 10*3/uL (ref 1.7–7.7)
Neutrophils Relative %: 62 %
Platelets: 249 10*3/uL (ref 150–400)
RBC: 2.68 MIL/uL — ABNORMAL LOW (ref 3.87–5.11)
RDW: 15.5 % (ref 11.5–15.5)
WBC: 7.4 10*3/uL (ref 4.0–10.5)
nRBC: 0 % (ref 0.0–0.2)

## 2023-05-25 LAB — GLUCOSE, CAPILLARY
Glucose-Capillary: 121 mg/dL — ABNORMAL HIGH (ref 70–99)
Glucose-Capillary: 144 mg/dL — ABNORMAL HIGH (ref 70–99)
Glucose-Capillary: 119 mg/dL — ABNORMAL HIGH (ref 70–99)
Glucose-Capillary: 136 mg/dL — ABNORMAL HIGH (ref 70–99)

## 2023-05-25 LAB — BASIC METABOLIC PANEL
Anion gap: 11 (ref 5–15)
BUN: 65 mg/dL — ABNORMAL HIGH (ref 8–23)
CO2: 21 mmol/L — ABNORMAL LOW (ref 22–32)
Calcium: 8.6 mg/dL — ABNORMAL LOW (ref 8.9–10.3)
Chloride: 108 mmol/L (ref 98–111)
Creatinine, Ser: 4.02 mg/dL — ABNORMAL HIGH (ref 0.44–1.00)
GFR, Estimated: 12 mL/min — ABNORMAL LOW (ref >60)
Glucose, Bld: 177 mg/dL — ABNORMAL HIGH (ref 70–99)
Potassium: 3.7 mmol/L (ref 3.5–5.1)
Sodium: 140 mmol/L (ref 135–145)

## 2023-05-25 LAB — IRON AND TIBC
Iron: 44 ug/dL (ref 28–170)
Saturation Ratios: 15 % (ref 10.4–31.8)
TIBC: 287 ug/dL (ref 250–450)
UIBC: 243 ug/dL

## 2023-05-25 LAB — PHOSPHORUS: Phosphorus: 4.6 mg/dL (ref 2.5–4.6)

## 2023-05-25 LAB — FERRITIN: Ferritin: 50 ng/mL (ref 11–307)

## 2023-05-25 LAB — PARATHYROID HORMONE, INTACT (NO CA): PTH: 651 pg/mL — ABNORMAL HIGH (ref 15–65)

## 2023-05-25 MED ORDER — FUROSEMIDE 10 MG/ML IJ SOLN
80.0000 mg | Freq: Once | INTRAMUSCULAR | Status: AC
  Administered 2023-05-25: 80 mg via INTRAVENOUS
  Filled 2023-05-25: qty 8

## 2023-05-25 NOTE — Progress Notes (Signed)
[facility], [place] 05/25/23  Subjective:   Hospital day # 2  Seen laying in bed Complains of dyspnea on exertion Lower extremity edema remains  Renal: 05/24 0701 - 05/25 0700 In: 180 [P.O.:180] Out: -  Lab Results  Component Value Date   CREATININE 4.02 (H) 05/25/2023   CREATININE 4.22 (H) 05/24/2023   CREATININE 4.06 (H) 05/23/2023     Objective:  Vital signs in last 24 hours:  Temp:  [98.6 F (37 C)-99.2 F (37.3 C)] 98.8 F (37.1 C) (05/25 0813) Pulse Rate:  [74-79] 79 (05/25 0813) Resp:  [18] 18 (05/25 0813) BP: (143-178)/(69-83) 178/83 (05/25 0813) SpO2:  [98 %-100 %] 100 % (05/25 0813)  Weight change:  Filed Weights   05/23/23 0714  Weight: 100.7 kg    Intake/Output:   No intake or output data in the 24 hours ending 05/25/23 1031    Physical Exam: General: No acute distress, sitting up in bed  HEENT Moist oral mucous membranes  Pulm/lungs Normal breathing effort on room air, clear to auscultation  CVS/Heart Regular, no rub  Abdomen:  Soft, obese  Extremities: 2+ pitting edema bilaterally  Neurologic: Alert, oriented  Skin: Warm, dry          Basic Metabolic Panel:  Recent Labs  Lab 05/23/23 0717 05/24/23 0430 05/25/23 0408  NA 143 141 140  K 3.8 3.6 3.7  CL 111 109 108  CO2 20* 22 21*  GLUCOSE 127* 159* 177*  BUN 65* 67* 65*  CREATININE 4.06* 4.22* 4.02*  CALCIUM 8.7* 8.5* 8.6*      CBC: Recent Labs  Lab 05/23/23 0717 05/24/23 0430 05/25/23 0408  WBC 7.5 6.3 7.4  NEUTROABS  --   --  4.6  HGB 8.7* 7.7* 7.7*  HCT 27.2* 24.3* 24.2*  MCV 91.3 90.7 90.3  PLT 294 248 249       Lab Results  Component Value Date   HEPBSAG NON REACTIVE 01/14/2021   HEPBSAB Reactive (A) 01/14/2021      Microbiology:  No results found for this or any previous visit (from the past 240 hour(s)).  Coagulation Studies: No results for input(s): "LABPROT", "INR" in the last 72 hours.  Urinalysis: No  results for input(s): "COLORURINE", "LABSPEC", "PHURINE", "GLUCOSEU", "HGBUR", "BILIRUBINUR", "KETONESUR", "PROTEINUR", "UROBILINOGEN", "NITRITE", "LEUKOCYTESUR" in the last 72 hours.  Invalid input(s): "APPERANCEUR"    Imaging: ECHOCARDIOGRAM COMPLETE  Result Date: 05/23/2023    ECHOCARDIOGRAM REPORT   Patient Name:   MIRINDA WELCHER Date of Exam: 05/23/2023 Medical Rec #:  [id number]           Height:       57.0 in Accession #:    [id number]          Weight:       222.0 lb Date of Birth:  1961-02-27           BSA:          1.880 m Patient Age:    61 years            BP:           152/117 mmHg Patient Gender: F                   HR:           82 bpm. Exam Location:  [facility] Procedure: 2D Echo, Color Doppler and Cardiac Doppler Indications:     CHF-acute diastolic I50.31  History:  Patient has prior history of Echocardiogram examinations, most                  recent 01/12/2021.  Sonographer:     Cristela Blue Referring Phys:  [id number] Francoise Schaumann [place] Diagnosing Phys: Julien Nordmann MD IMPRESSIONS  1. Left ventricular ejection fraction, by estimation, is 60 to 65%. The left ventricle has normal function. The left ventricle has no regional wall motion abnormalities. Left ventricular diastolic parameters are consistent with Grade I diastolic dysfunction (impaired relaxation).  2. Right ventricular systolic function is normal. The right ventricular size is normal. There is normal pulmonary artery systolic pressure. The estimated right ventricular systolic pressure is 20.5 mmHg.  3. The mitral valve is normal in structure. No evidence of mitral valve regurgitation. No evidence of mitral stenosis.  4. The aortic valve has an indeterminant number of cusps. There is mild calcification of the aortic valve. Aortic valve regurgitation is not visualized. Aortic valve sclerosis is present, with no evidence of aortic valve stenosis.  5. The inferior vena cava is normal in size with greater than 50% respiratory variability,  suggesting right atrial pressure of 3 mmHg. FINDINGS  Left Ventricle: Left ventricular ejection fraction, by estimation, is 60 to 65%. The left ventricle has normal function. The left ventricle has no regional wall motion abnormalities. The left ventricular internal cavity size was normal in size. There is  no left ventricular hypertrophy. Left ventricular diastolic parameters are consistent with Grade I diastolic dysfunction (impaired relaxation). Right Ventricle: The right ventricular size is normal. No increase in right ventricular wall thickness. Right ventricular systolic function is normal. There is normal pulmonary artery systolic pressure. The tricuspid regurgitant velocity is 1.97 m/s, and  with an assumed right atrial pressure of 5 mmHg, the estimated right ventricular systolic pressure is 20.5 mmHg. Left Atrium: Left atrial size was normal in size. Right Atrium: Right atrial size was normal in size. Pericardium: There is no evidence of pericardial effusion. Mitral Valve: The mitral valve is normal in structure. Mild mitral annular calcification. No evidence of mitral valve regurgitation. No evidence of mitral valve stenosis. MV peak gradient, 5.3 mmHg. The mean mitral valve gradient is 3.0 mmHg. Tricuspid Valve: The tricuspid valve is normal in structure. Tricuspid valve regurgitation is not demonstrated. No evidence of tricuspid stenosis. Aortic Valve: The aortic valve has an indeterminant number of cusps. There is mild calcification of the aortic valve. Aortic valve regurgitation is not visualized. Aortic valve sclerosis is present, with no evidence of aortic valve stenosis. Aortic valve  mean gradient measures 6.0 mmHg. Aortic valve peak gradient measures 11.0 mmHg. Aortic valve area, by VTI measures 2.55 cm. Pulmonic Valve: The pulmonic valve was normal in structure. Pulmonic valve regurgitation is not visualized. No evidence of pulmonic stenosis. Aorta: The aortic root is normal in size and  structure. Venous: The inferior vena cava is normal in size with greater than 50% respiratory variability, suggesting right atrial pressure of 3 mmHg. IAS/Shunts: No atrial level shunt detected by color flow Doppler.  LEFT VENTRICLE PLAX 2D LVIDd:         4.70 cm   Diastology LVIDs:         2.80 cm   LV e' medial:    5.66 cm/s LV PW:         1.30 cm   LV E/e' medial:  16.4 LV IVS:        1.20 cm   LV e' lateral:   9.46 cm/s LVOT  diam:     2.00 cm   LV E/e' lateral: 9.8 LV SV:         77 LV SV Index:   41 LVOT Area:     3.14 cm  RIGHT VENTRICLE RV Basal diam:  4.30 cm RV Mid diam:    3.40 cm RV S prime:     14.70 cm/s TAPSE (M-mode): 3.3 cm LEFT ATRIUM             Index        RIGHT ATRIUM           Index LA diam:        4.10 cm 2.18 cm/m   RA Area:     13.50 cm LA Vol (A2C):   45.0 ml 23.93 ml/m  RA Volume:   32.80 ml  17.44 ml/m LA Vol (A4C):   32.3 ml 17.18 ml/m LA Biplane Vol: 37.7 ml 20.05 ml/m  AORTIC VALVE AV Area (Vmax):    1.88 cm AV Area (Vmean):   2.14 cm AV Area (VTI):     2.55 cm AV Vmax:           165.50 cm/s AV Vmean:          114.500 cm/s AV VTI:            0.302 m AV Peak Grad:      11.0 mmHg AV Mean Grad:      6.0 mmHg LVOT Vmax:         99.00 cm/s LVOT Vmean:        78.000 cm/s LVOT VTI:          0.246 m LVOT/AV VTI ratio: 0.81  AORTA Ao Root diam: 3.20 cm MITRAL VALVE                TRICUSPID VALVE MV Area (PHT): 2.54 cm     TR Peak grad:   15.5 mmHg MV Area VTI:   2.59 cm     TR Vmax:        197.00 cm/s MV Peak grad:  5.3 mmHg MV Mean grad:  3.0 mmHg     SHUNTS MV Vmax:       1.15 m/s     Systemic VTI:  0.25 m MV Vmean:      78.5 cm/s    Systemic Diam: 2.00 cm MV Decel Time: 299 msec MV E velocity: 92.80 cm/s MV A velocity: 106.00 cm/s MV E/A ratio:  0.88 Julien Nordmann MD Electronically signed by Julien Nordmann MD Signature Date/Time: 05/23/2023/5:34:46 PM    Final    US RENAL  Result Date: 05/23/2023 CLINICAL DATA:  Acute kidney injury. EXAM: RENAL / URINARY TRACT ULTRASOUND  COMPLETE COMPARISON:  01/13/2021 FINDINGS: Right Kidney: Renal measurements: 9.6 x 6.2 x 6.3 cm = volume: 196.1 mL. Increased parenchymal echogenicity. No mass or hydronephrosis. Left Kidney: Renal measurements: 10.0 x 5.8 x 5.3 cm = volume: 161.4 mL. Increased parenchymal echogenicity. No mass or hydronephrosis. Bladder: Appears normal for degree of bladder distention. Other: None. IMPRESSION: 1. No acute findings. No hydronephrosis. 2. Increased renal parenchymal echogenicity compatible with chronic medical renal disease. Electronically Signed   By: Signa Kell M.D.   On: 05/23/2023 16:16     Medications:    sodium chloride      allopurinol  100 mg Oral Daily   aspirin EC  81 mg Oral Daily   carvedilol  6.25 mg Oral BID AC   furosemide  80 mg Oral  Daily   heparin  5,000 Units Subcutaneous Q8H   hydrALAZINE  25 mg Oral Q8H   insulin aspart  0-9 Units Subcutaneous TID WC   isosorbide mononitrate  30 mg Oral BID   loratadine  10 mg Oral Daily   pantoprazole  40 mg Oral Daily   sodium chloride flush  3 mL Intravenous Q12H   sodium chloride, acetaminophen, ondansetron **OR** ondansetron (ZOFRAN) IV, sodium chloride flush  Assessment/ Plan:  62 y.o. female with medical problems of obesity, asthma, diabetes, chronic kidney disease, hypertension   admitted on 05/23/2023 for Anasarca [R60.1] AKI (acute kidney injury) (HCC) [N17.9] Acute on chronic congestive heart failure, unspecified heart failure type (HCC) [I50.9]  Acute kidney injury on chronic kidney disease st 4 versus progression of chronic kidney disease to stage V. CKD risk factors include diabetes, hypertension, obesity, use of nonsteroidals. Renal ultrasound negative for obstruction Awaiting phosphorus with am labs  Creatinine slightly improved. Patient states she is voiding frequently, no urine output recorded.   No acute indication for dialysis at this time.  Will continue to monitor renal function during diuretic  therapy.     Hypertension with lower extremity edema Patient has allergy to ACE inhibitor's.  IV furosemide used intermittently.  Blood pressure remains elevated at times.  Lower extremity edema remains present with dyspnea on exertion.  Will order IV furosemide 80 mg today.  Will consider transitioning to oral furosemide tomorrow.   Type 2 diabetes with CKD Agree with assessing hemoglobin A1c. Not a candidate for SGLT2 inhibitor due to low GFR at this time.  Glucose added quickly controlled with sliding scale insulin.  Anemia of chronic kidney disease Hemoglobin remains 7.7. Iron studies appropriate.  Will consider patient for ESA.     LOS: 2 Wendee Beavers 5/25/202410:31 AM  [facility] [address] [place], [place] [phone]  Note: This note was prepared with Dragon dictation. Any transcription errors are unintentional

## 2023-05-25 NOTE — Progress Notes (Signed)
Progress Note   Patient: Brianna [name] QIH:[id number] DOB: February 10, 1961 DOA: 05/23/2023     2 DOS: the patient was seen and examined on 05/25/2023     Subjective:  Patient seen and examined at bedside this morning Swelling significantly better today Currently diuresing well Denies nausea vomiting chest pain cough or urinary complaints   Brief hospital course: From HPI "Brianna [name] is a 62 y.o. female with medical history significant of obesity, asthma, hypertension, type 2 diabetes, stage IV CKD presenting with anasarca, acute on chronic stage IV CKD.  Patient reports worsening shortness of breath over multiple weeks.  Noted to have been admitted back in January 2022 with similar issues including volume overload and edema.  Patient reports taking 80 mg of Lasix on a regular basis.  Still worsening lower extremity swelling.  Positive orthopnea.  No chest pain.  Patient does admit to eating a high salt diet as well as taking BC powders on a regular basis for bilateral knee pain.  No hemiparesis or confusion.  Per the patient, her blood sugars have been well-controlled. Presented to the ER afebrile, blood pressure 150s over 110s, satting well on room air.  White count 7.5, hemoglobin 8.7, platelets 294, creatinine of 4.1, glucose 127. BNP WNL.  Chest x-ray grossly stable apart from enlarged cardiac silhouette."   Assessment and Plan: * Anasarca Generalized body edema in the setting of acute on chronic stage IV CKD and acute on chronic diastolic CHF Volume overload improving with diuresis Continue Lasix Allergist on board we appreciate input Echo done in this hospitalization shows ejection fraction 60 to 65%   Acute renal failure superimposed on stage 4 chronic kidney disease (HCC) Baseline creatinine around 2-2.8 with GFR's in the 20s Creatinine 4 today with GFR of 12  Noted regular BC powder use and high salt intake  + anasarca  Still making some urine  Continue Lasix as  recommended by nephrologist Dr. Thedore Mins w/ nephrology consulted for formal recommendations.      (HFpEF) heart failure with preserved ejection fraction [facility]) 2D echo January 2022 with a EF of 7075% as well as grade 1 diastolic dysfunction Noted anasarca and volume overload on exam in setting of acute on chronic stage IV CKD BNP stable Positive cardiomegaly on chest x-ray Continue diuresis as recommended by nephrologist Cardiologist on board we appreciate input     Asthma Stable from respiratory standpoint Continue home inhaler   Hypertension BP stable  Titrate home regimen      Type 2 diabetes mellitus without complication, with long-term current use of insulin (HCC) SSI A1c      Advance Care Planning:   Code Status: Full Code    Consults: Dr. Thedore Mins w/ nephrology    Family Communication: No family at the bedside          Physical Exam: Appearance: She is obese.  HENT:     Head: Normocephalic.     Mouth/Throat:     Mouth: Mucous membranes are moist.  Eyes:     Pupils: Pupils are equal, round, and reactive to light.  Cardiovascular:     Rate and Rhythm: Normal rate and regular rhythm.  Pulmonary:     Effort: Pulmonary effort is normal.  Abdominal:     General: Bowel sounds are normal.  Musculoskeletal:     Comments: 1+ pitting edema bilaterally  Neurological:     General: No focal deficit present.  Psychiatric:        Mood and Affect: Mood  normal.   I  Data Reviewed: have personally reviewed patient's laboratory results today showing sodium 140 potassium 3.7 creatinine 4.02 hemoglobin 7.7  Time spent: 50 minutes  Vitals:   05/24/23 2026 05/25/23 0427 05/25/23 0432 05/25/23 0813  BP: (!) 143/74 (!) 170/83 (!) 144/69 (!) 178/83  Pulse: 75 74 76 79  Resp: 18 18  18   Temp: 98.6 F (37 C) 99.2 F (37.3 C)  98.8 F (37.1 C)  TempSrc:      SpO2: 98% 99%  100%  Weight:      Height:         Author: Loyce Dys, MD 05/25/2023 3:07 PM  For on call  review [email or web address].

## 2023-05-26 DIAGNOSIS — R601 Generalized edema: Secondary | ICD-10-CM | POA: Diagnosis not present

## 2023-05-26 LAB — CBC WITH DIFFERENTIAL/PLATELET
Abs Immature Granulocytes: 0.02 10*3/uL (ref 0.00–0.07)
Basophils Absolute: 0 10*3/uL (ref 0.0–0.1)
Basophils Relative: 0 %
Eosinophils Absolute: 0.1 10*3/uL (ref 0.0–0.5)
Eosinophils Relative: 2 %
HCT: 25.3 % — ABNORMAL LOW (ref 36.0–46.0)
Hemoglobin: 8 g/dL — ABNORMAL LOW (ref 12.0–15.0)
Immature Granulocytes: 0 %
Lymphocytes Relative: 23 %
Lymphs Abs: 1.4 10*3/uL (ref 0.7–4.0)
MCH: 28.9 pg (ref 26.0–34.0)
MCHC: 31.6 g/dL (ref 30.0–36.0)
MCV: 91.3 fL (ref 80.0–100.0)
Monocytes Absolute: 0.5 10*3/uL (ref 0.1–1.0)
Monocytes Relative: 8 %
Neutro Abs: 4.2 10*3/uL (ref 1.7–7.7)
Neutrophils Relative %: 67 %
Platelets: 260 10*3/uL (ref 150–400)
RBC: 2.77 MIL/uL — ABNORMAL LOW (ref 3.87–5.11)
RDW: 15.3 % (ref 11.5–15.5)
WBC: 6.1 10*3/uL (ref 4.0–10.5)
nRBC: 0 % (ref 0.0–0.2)

## 2023-05-26 LAB — BASIC METABOLIC PANEL
Anion gap: 8 (ref 5–15)
BUN: 70 mg/dL — ABNORMAL HIGH (ref 8–23)
CO2: 24 mmol/L (ref 22–32)
Calcium: 8.6 mg/dL — ABNORMAL LOW (ref 8.9–10.3)
Chloride: 108 mmol/L (ref 98–111)
Creatinine, Ser: 4.16 mg/dL — ABNORMAL HIGH (ref 0.44–1.00)
GFR, Estimated: 12 mL/min — ABNORMAL LOW (ref >60)
Glucose, Bld: 164 mg/dL — ABNORMAL HIGH (ref 70–99)
Potassium: 3.7 mmol/L (ref 3.5–5.1)
Sodium: 140 mmol/L (ref 135–145)

## 2023-05-26 LAB — GLUCOSE, CAPILLARY
Glucose-Capillary: 144 mg/dL — ABNORMAL HIGH (ref 70–99)
Glucose-Capillary: 118 mg/dL — ABNORMAL HIGH (ref 70–99)
Glucose-Capillary: 190 mg/dL — ABNORMAL HIGH (ref 70–99)
Glucose-Capillary: 125 mg/dL — ABNORMAL HIGH (ref 70–99)

## 2023-05-26 NOTE — Progress Notes (Signed)
Progress Note   Patient: Brianna [name] ZOX:[id number] DOB: 01-06-1961 DOA: 05/23/2023     3 DOS: the patient was seen and examined on 05/26/2023     Subjective:  Patient seen and examined at bedside this morning Swelling significantly better today Currently diuresing well Renal function function slightly worse today Denies nausea vomiting chest pain cough or urinary complaints   Brief hospital course: From HPI "Brianna [name] is a 62 y.o. female with medical history significant of obesity, asthma, hypertension, type 2 diabetes, stage IV CKD presenting with anasarca, acute on chronic stage IV CKD.  Patient reports worsening shortness of breath over multiple weeks.  Noted to have been admitted back in January 2022 with similar issues including volume overload and edema.  Patient reports taking 80 mg of Lasix on a regular basis.  Still worsening lower extremity swelling.  Positive orthopnea.  No chest pain.  Patient does admit to eating a high salt diet as well as taking BC powders on a regular basis for bilateral knee pain.  No hemiparesis or confusion.  Per the patient, her blood sugars have been well-controlled. Presented to the ER afebrile, blood pressure 150s over 110s, satting well on room air.  White count 7.5, hemoglobin 8.7, platelets 294, creatinine of 4.1, glucose 127. BNP WNL.  Chest x-ray grossly stable apart from enlarged cardiac silhouette."   Assessment and Plan: * Anasarca Generalized body edema in the setting of acute on chronic stage IV CKD and acute on chronic diastolic CHF Volume overload improving with diuresis Nephrologist managing Lasix Cardiologist on board we appreciate input Echo done in this hospitalization shows ejection fraction 60 to 65%   Acute renal failure superimposed on stage 4 chronic kidney disease (HCC) Baseline creatinine around 2-2.8 with GFR's in the 20s Creatinine 4 today with GFR of 12  Noted regular BC powder use and high salt intake   + anasarca  Still making some urine  Nephrology is managing Lasix Dr. Thedore Mins w/ nephrology consulted for formal recommendations.      (HFpEF) heart failure with preserved ejection fraction (HCC) 2D echo January 2022 with a EF of 7075% as well as grade 1 diastolic dysfunction Noted anasarca and volume overload on exam in setting of acute on chronic stage IV CKD BNP stable Positive cardiomegaly on chest x-ray Nephrologist managing diuretics at this time Cardiologist on board we appreciate input     Asthma Stable from respiratory standpoint Continue home inhaler   Hypertension BP stable  Titrate home regimen      Type 2 diabetes mellitus without complication, with long-term current use of insulin (HCC) SSI A1c      Advance Care Planning:   Code Status: Full Code    Consults: Dr. Thedore Mins w/ nephrology    Family Communication: No family at the bedside          Physical Exam: Appearance: She is obese.  HENT:     Head: Normocephalic.     Mouth/Throat:     Mouth: Mucous membranes are moist.  Eyes:     Pupils: Pupils are equal, round, and reactive to light.  Cardiovascular:     Rate and Rhythm: Normal rate and regular rhythm.  Pulmonary:     Effort: Pulmonary effort is normal.  Abdominal:     General: Bowel sounds are normal.  Musculoskeletal:     Comments: 1+ pitting edema bilaterally  Neurological:     General: No focal deficit present.  Psychiatric:  Mood and Affect: Mood normal.    Data Reviewed: have personally reviewed patient's laboratory results today showing sodium 140 potassium 3.7 creatinine 4.1   Time spent: 45 minutes  Vitals:   05/25/23 1544 05/25/23 2010 05/26/23 0457 05/26/23 0731  BP: (!) 154/74 (!) 166/84 (!) 158/74 (!) 170/87  Pulse: 74 78 79 74  Resp: 20 20 20 20   Temp: 98 F (36.7 C) 98.2 F (36.8 C) 98.6 F (37 C) 98.2 F (36.8 C)  TempSrc:  Oral  Oral  SpO2: 99% 100% 99% 100%  Weight:      Height:          Author: Loyce Dys, MD 05/26/2023 4:40 PM  For on call review [email or web address].

## 2023-05-26 NOTE — Progress Notes (Signed)
Mobility Specialist - Progress Note    05/26/23 1433  Mobility  Activity Ambulated independently in hallway  Level of Assistance Independent  Assistive Device Front wheel walker  Distance Ambulated (ft) 160 ft  Range of Motion/Exercises Active  Activity Response Tolerated well  Mobility Referral Yes  $Mobility charge 1 Mobility  Mobility Specialist Start Time (ACUTE ONLY) 1420  Mobility Specialist Stop Time (ACUTE ONLY) 1433  Mobility Specialist Time Calculation (min) (ACUTE ONLY) 13 min   Pt resting EOB upon entry on RA. Pt STS and ambulates to hallway around NS for 1 lap Indep with AD. Pt returned to EOB and left with needs in reach.   Johnathan Hausen Mobility Specialist 05/26/23, 2:38 PM

## 2023-05-26 NOTE — Discharge Instructions (Signed)
Rent/Utility/Housing  Agency Name: [facility] Address: [address] [place], [place] [address] Phone: [phone] Email: [email or web address] Website: www.[email or web address].org Service(s) Offered: Housing services, self-sufficiency, congregate meal  program, weatherization program, heating appliance  repair/replacement program, emergency food assistance,  housing counseling, home ownership program, wheels -towork program.  Agency Name: [facility] Address: [address], [place], [place] [address] Phone: [phone] (8a-4p) [phone] (8p- 10p) Email: [email or web address] Website: [email or web address] Service(s) Offered: A program for homeless and/or needy men that includes one-on-one counseling, life skills training and job rehabilitation.  Agency Name: [facility] Address: [address], [place], [place] [address] Phone: [phone] Website: www.[facility].org Service(s) Offered: Assistance to needy in emergency with utility bills, heating  fuel, and prescriptions. Shelter for homeless 7pm-7am. April 25, 2017 15  Agency Name: [facility] Address: [address], [place], [place] [address] Phone: [phone] Contact Person: Wayne Dawson Email: [email or web address] Website: www.arcnc.org Service(s) Offered: Helps individuals with developmental disabilities move  from housing that is more restrictive to homes where they  can achieve greater independence and have more  opportunities.  Agency Name: [facility] Address: [address], [place], [place] [address] Phone: [phone] Email: [email or web address] Website: www.[email or web address].org Service(s) Offered: Provides affordable housing for low-income families,  elderly, and disabled individuals. Offer a wide range of  programs and services, from financial planning  to afterschool and summer programs.  Agency Name: [facility] Address: [address] [place], [place], [place] [address] Phone: [phone] Service(s) Offered: Child support services; child welfare services; food stamps;  Medicaid; work first family assistance; and aid with fuel,  rent, food and medicine.  Agency Name: [facility] Address: [facility] [place], [place]  [address] Phone: [phone] Website: www.familyabuseservices.org Service(s) Offered: 24 hour Crisis Line: 226-5985; 24 hour Emergency Shelter;  Transitional Housing; Support Groups; Court Advocacy;  Community Education; Hispanic Outreach: [phone];  Visitation Center: [phone]. April 25, 2017 16  Agency Name: [facility] Address: [address] [place], [place] [address] Phone: [phone] Service(s) Offered: CAP Services; Home and Community Supports; Individual  or Group Supports; Respite Care Non-Institutional Nursing;  Residential Supports; Respite Care and Personal Care  Services; Transportation; Family and Friends Night;  Recreational Activities; Three Nutritious Meals/Snacks;  Consultation with Registered Dietician; Twenty-four hour  Registered Nurse Access; Daily and Community Living  Skills; Camp Green [employer]; Summer Camp for the Special  Population (During Summer Months) Bingo Night (Every  Wednesday Night); Special Populations Dance Night  (Every Tuesday Night); Professional Hair Care Services.  Agency Name: [facility] Address: [address], [place], [place] [address] Phone: [phone] Contact Person: Gloria McCauley Website: [email or web address] Service(s) Offered: Residential treatment facility for women; food and  clothing, educational & employment development and  transportation to work; development of financial skills;  parenting and family  reunification; emotional and spiritual  support; transitional housing for program graduates.  Agency Name: [facility] Address: [address], [place], [place] [address] Phone: [phone] Email: [email or web address] Website: [email or web address] Service(s) Offered: Public housing units for elderly, disabled, and low income  people; housing choice vouchers for income eligible  applicants; shelter plus care vouchers; and HOPWA  voucher program. April 25, 2017 17  Agency Name: [facility] Address: [address], , [place] [address] Phone: [phone] Email: [email or web address] Website: www.habitatalamance.org Service(s) Offered: Build houses for families in need of decent housing. Each    adult in the family must invest 200 hours of labor on  someone else's house, work with volunteers to build their  own house, attend classes on budgeting, home maintenance, yard care, and attend homeowner association  meetings.  Agency Name: [facility] Address: [address], [place], [place] [address] Phone: [phone] Website: www.rsli.org Service(s) Offered: Intermediate care facilities for mentally retarded,  Supervised Living in group homes for adults with  developmental disabilities, Supervised Living for people  who have dual diagnoses (MRMI), Independent Living,  Supported Living, respite and a variety of CAP services,  pre-vocational services, day supports, and Community  Support Services.  Agency Name: [place] Phone: [phone] Website: www.[place]CForeclosurePrevention.gov Service(s) Offered: Zero-interest, deferred loans to homeowners struggling to  pay their mortgage. Call for more information  

## 2023-05-26 NOTE — Progress Notes (Signed)
[facility], [place] 05/26/23  Subjective:   Hospital day # 3  Seen sitting at side of bed Eating breakfast States breathing mildly improved however still feels she has fluid all  Creatinine slightly increased  Renal: 05/25 0701 - 05/26 0700 In: 600 [P.O.:600] Out: 1100 [Urine:1100] Lab Results  Component Value Date   CREATININE 4.16 (H) 05/26/2023   CREATININE 4.02 (H) 05/25/2023   CREATININE 4.22 (H) 05/24/2023     Objective:  Vital signs in last 24 hours:  Temp:  [98 F (36.7 C)-98.6 F (37 C)] 98.2 F (36.8 C) (05/26 0731) Pulse Rate:  [74-79] 74 (05/26 0731) Resp:  [20] 20 (05/26 0731) BP: (154-170)/(74-87) 170/87 (05/26 0731) SpO2:  [99 %-100 %] 100 % (05/26 0731)  Weight change:  Filed Weights   05/23/23 0714  Weight: 100.7 kg    Intake/Output:    Intake/Output Summary (Last 24 hours) at 05/26/2023 1118 Last data filed at 05/26/2023 1037 Gross per 24 hour  Intake 600 ml  Output 1100 ml  Net -500 ml      Physical Exam: General: No acute distress, sitting at bedside  HEENT Moist oral mucous membranes  Pulm/lungs Normal breathing effort on room air, clear to auscultation  CVS/Heart Regular, no rub  Abdomen:  Soft, obese  Extremities: 2+ pitting edema bilaterally  Neurologic: Alert, oriented  Skin: Warm, dry          Basic Metabolic Panel:  Recent Labs  Lab 05/23/23 0717 05/24/23 0430 05/25/23 0408 05/26/23 0410  NA 143 141 140 140  K 3.8 3.6 3.7 3.7  CL 111 109 108 108  CO2 20* 22 21* 24  GLUCOSE 127* 159* 177* 164*  BUN 65* 67* 65* 70*  CREATININE 4.06* 4.22* 4.02* 4.16*  CALCIUM 8.7* 8.5* 8.6* 8.6*  PHOS  --   --  4.6  --       CBC: Recent Labs  Lab 05/23/23 0717 05/24/23 0430 05/25/23 0408 05/26/23 0410  WBC 7.5 6.3 7.4 6.1  NEUTROABS  --   --  4.6 4.2  HGB 8.7* 7.7* 7.7* 8.0*  HCT 27.2* 24.3* 24.2* 25.3*  MCV 91.3 90.7 90.3 91.3  PLT 294 248 249 260       Lab Results  Component  Value Date   HEPBSAG NON REACTIVE 01/14/2021   HEPBSAB Reactive (A) 01/14/2021      Microbiology:  No results found for this or any previous visit (from the past 240 hour(s)).  Coagulation Studies: No results for input(s): "LABPROT", "INR" in the last 72 hours.  Urinalysis: No results for input(s): "COLORURINE", "LABSPEC", "PHURINE", "GLUCOSEU", "HGBUR", "BILIRUBINUR", "KETONESUR", "PROTEINUR", "UROBILINOGEN", "NITRITE", "LEUKOCYTESUR" in the last 72 hours.  Invalid input(s): "APPERANCEUR"    Imaging: No results found.   Medications:    sodium chloride      allopurinol  100 mg Oral Daily   aspirin EC  81 mg Oral Daily   carvedilol  6.25 mg Oral BID AC   furosemide  80 mg Oral Daily   heparin  5,000 Units Subcutaneous Q8H   hydrALAZINE  25 mg Oral Q8H   insulin aspart  0-9 Units Subcutaneous TID WC   isosorbide mononitrate  30 mg Oral BID   loratadine  10 mg Oral Daily   pantoprazole  40 mg Oral Daily   sodium chloride flush  3 mL Intravenous Q12H   sodium chloride, acetaminophen, ondansetron **OR** ondansetron (ZOFRAN) IV, sodium chloride flush  Assessment/ Plan:  63 y.o. female with  medical problems of obesity, asthma, diabetes, chronic kidney disease, hypertension   admitted on 05/23/2023 for Anasarca [R60.1] AKI (acute kidney injury) (HCC) [N17.9] Acute on chronic congestive heart failure, unspecified heart failure type (HCC) [I50.9]  Acute kidney injury on chronic kidney disease st 4 versus progression of chronic kidney disease to stage V. CKD risk factors include diabetes, hypertension, obesity, use of nonsteroidals. Renal ultrasound negative for obstruction Awaiting phosphorus with am labs  Creatinine slightly increased today, likely due to IV Lasix given yesterday.  Urine output 1.1 L in preceding 24 hours.  Will allow patient to rest today, hold diuretics.  Will reassess tomorrow.  No acute indication for dialysis.  Monitoring closely.     Hypertension  with lower extremity edema Patient has allergy to ACE inhibitor's.  IV furosemide used intermittently.  Blood pressure remains elevated at times.  Lower extremity edema remains present with dyspnea on exertion.  IV furosemide given on 05/26/2023.  Was considering transitioning to oral diuretics today however creatinine increased.  Will hold all diuretics today and monitor.   Type 2 diabetes with CKD Agree with assessing hemoglobin A1c. Not a candidate for SGLT2 inhibitor due to low GFR at this time.  Glucose well controlled with sliding scale insulin.  Anemia of chronic kidney disease Hemoglobin slightly improved to 8.0 Iron studies appropriate.       LOS: 3 Wendee Beavers 5/26/202411:18 AM  [address] [place], [place] [phone]

## 2023-05-26 NOTE — TOC Initial Note (Signed)
Transition of Care [facility]) - Initial/Assessment Note    Patient Details  Name: Brianna [name] MRN: [id number] Date of Birth: 10-27-1961  Transition of Care [facility]) CM/SW Contact:    Liliana Cline, LCSW Phone Number: 05/26/2023, 3:26 PM  Clinical Narrative:                 Patient with high readmission risk score. Completed assessment with patient.  Patient lives with her cousin but is interested in housing resources. Resources added to be included in AVS. [facility] is [facility]. [facility]. No DME or HH history. Friends provide transportation.    Expected Discharge Plan: Home/Self Care Barriers to Discharge: Continued Medical Work up   Patient Goals and CMS Choice Patient states their goals for this hospitalization and ongoing recovery are:: to return home CMS Medicare.gov Compare Post Acute Care list provided to:: Patient Choice offered to / list presented to : Patient      Expected Discharge Plan and Services       Living arrangements for the past 2 months: Single Family Home                                      Prior Living Arrangements/Services Living arrangements for the past 2 months: Single Family Home Lives with:: Relatives Patient language and need for interpreter reviewed:: Yes        Need for Family Participation in Patient Care: Yes (Comment) Care giver support system in place?: Yes (comment)   Criminal Activity/Legal Involvement Pertinent to Current Situation/Hospitalization: No - Comment as needed  Activities of Daily Living Home Assistive Devices/Equipment: None ADL Screening (condition at time of admission) Patient's cognitive ability adequate to safely complete daily activities?: Yes Is the patient deaf or have difficulty hearing?: No Does the patient have difficulty seeing, even when wearing glasses/contacts?: No Does the patient have difficulty concentrating, remembering, or making  decisions?: No Patient able to express need for assistance with ADLs?: No Does the patient have difficulty dressing or bathing?: No Independently performs ADLs?: No Communication: Independent Dressing (OT): Independent Grooming: Independent Feeding: Independent Bathing: Independent Toileting: Independent In/Out Bed: Independent Walks in Home: Independent Does the patient have difficulty walking or climbing stairs?: No Weakness of Legs: None Weakness of Arms/Hands: None  Permission Sought/Granted                  Emotional Assessment       Orientation: : Oriented to Self, Oriented to Situation, Oriented to  Time, Oriented to Place Alcohol / Substance Use: Not Applicable Psych Involvement: No (comment)  Admission diagnosis:  Anasarca [R60.1] AKI (acute kidney injury) (HCC) [N17.9] Acute on chronic congestive heart failure, unspecified heart failure type (HCC) [I50.9] Patient Active Problem List   Diagnosis Date Noted   Acute on chronic congestive heart failure (HCC) 05/24/2023   Anasarca 05/23/2023   (HFpEF) heart failure with preserved ejection fraction (HCC) 05/23/2023   Dyspnea 01/18/2021   Bilateral lower extremity edema 01/18/2021   Volume overload 01/18/2021   Bronchopneumonia 01/12/2021   Type II diabetes mellitus with renal manifestations (HCC) 01/12/2021   Acid reflux    Asthma    Anemia due to stage 4 chronic kidney disease (HCC)    Hypokalemia    Type 2 diabetes mellitus without complication, with long-term current use of insulin (HCC)    Acute renal failure superimposed on  stage 4 chronic kidney disease (HCC)    Hypertension    Strangulated ventral hernia 02/09/2020   PCP:  Emogene Morgan, MD Pharmacy:   [facility] [address] (N), [place] - [address] [place]) [place] [address] Phone: [phone] Fax: [phone]  [facility], [place] - [address] [place] [address] [place] [address] Phone: [phone] Fax: [phone]     Social Determinants of Health (SDOH) Social History: SDOH Screenings   Food Insecurity: No Food Insecurity (05/23/2023)  Housing: Low Risk  (05/23/2023)  Transportation Needs: No Transportation Needs (05/23/2023)  Utilities: Not At Risk (05/23/2023)  Tobacco Use: Low Risk  (05/23/2023)   SDOH Interventions:     Readmission Risk Interventions     No data to display

## 2023-05-26 NOTE — TOC CM/SW Note (Addendum)
Patient with high readmission risk score.  CSW working remotely today. CSW attempted calls into room. No answer. Asked RN to have patient call CSW when able so assessment can be completed.   11:30- Per RN, patient is not fully oriented and room phone is broken. TOC will continue to follow.    Alfonso Ramus, [place] [phone]

## 2023-05-27 DIAGNOSIS — R601 Generalized edema: Secondary | ICD-10-CM | POA: Diagnosis not present

## 2023-05-27 LAB — CBC WITH DIFFERENTIAL/PLATELET
Abs Immature Granulocytes: 0.02 10*3/uL (ref 0.00–0.07)
Basophils Absolute: 0 10*3/uL (ref 0.0–0.1)
Basophils Relative: 0 %
Eosinophils Absolute: 0.1 10*3/uL (ref 0.0–0.5)
Eosinophils Relative: 1 %
HCT: 25.8 % — ABNORMAL LOW (ref 36.0–46.0)
Hemoglobin: 8.2 g/dL — ABNORMAL LOW (ref 12.0–15.0)
Immature Granulocytes: 0 %
Lymphocytes Relative: 16 %
Lymphs Abs: 1 10*3/uL (ref 0.7–4.0)
MCH: 28.9 pg (ref 26.0–34.0)
MCHC: 31.8 g/dL (ref 30.0–36.0)
MCV: 90.8 fL (ref 80.0–100.0)
Monocytes Absolute: 0.6 10*3/uL (ref 0.1–1.0)
Monocytes Relative: 9 %
Neutro Abs: 4.6 10*3/uL (ref 1.7–7.7)
Neutrophils Relative %: 74 %
Platelets: 242 10*3/uL (ref 150–400)
RBC: 2.84 MIL/uL — ABNORMAL LOW (ref 3.87–5.11)
RDW: 15.8 % — ABNORMAL HIGH (ref 11.5–15.5)
WBC: 6.2 10*3/uL (ref 4.0–10.5)
nRBC: 0 % (ref 0.0–0.2)

## 2023-05-27 LAB — GLUCOSE, CAPILLARY
Glucose-Capillary: 109 mg/dL — ABNORMAL HIGH (ref 70–99)
Glucose-Capillary: 155 mg/dL — ABNORMAL HIGH (ref 70–99)
Glucose-Capillary: 148 mg/dL — ABNORMAL HIGH (ref 70–99)
Glucose-Capillary: 163 mg/dL — ABNORMAL HIGH (ref 70–99)

## 2023-05-27 LAB — SURGICAL PCR SCREEN
MRSA, PCR: NEGATIVE
Staphylococcus aureus: POSITIVE — AB

## 2023-05-27 LAB — BASIC METABOLIC PANEL
Anion gap: 5 (ref 5–15)
BUN: 70 mg/dL — ABNORMAL HIGH (ref 8–23)
CO2: 22 mmol/L (ref 22–32)
Calcium: 8.8 mg/dL — ABNORMAL LOW (ref 8.9–10.3)
Chloride: 111 mmol/L (ref 98–111)
Creatinine, Ser: 4.33 mg/dL — ABNORMAL HIGH (ref 0.44–1.00)
GFR, Estimated: 11 mL/min — ABNORMAL LOW (ref >60)
Glucose, Bld: 165 mg/dL — ABNORMAL HIGH (ref 70–99)
Potassium: 3.8 mmol/L (ref 3.5–5.1)
Sodium: 138 mmol/L (ref 135–145)

## 2023-05-27 MED ORDER — ALBUTEROL SULFATE (2.5 MG/3ML) 0.083% IN NEBU
2.5000 mg | INHALATION_SOLUTION | Freq: Four times a day (QID) | RESPIRATORY_TRACT | Status: DC | PRN
  Administered 2023-05-27 – 2023-05-31 (×3): 2.5 mg via RESPIRATORY_TRACT
  Filled 2023-05-27 (×3): qty 3

## 2023-05-27 MED ORDER — CARVEDILOL 12.5 MG PO TABS
12.5000 mg | ORAL_TABLET | Freq: Two times a day (BID) | ORAL | Status: DC
  Administered 2023-05-27 – 2023-05-31 (×8): 12.5 mg via ORAL
  Filled 2023-05-27 (×9): qty 1

## 2023-05-27 MED ORDER — CARVEDILOL 12.5 MG PO TABS
12.5000 mg | ORAL_TABLET | Freq: Once | ORAL | Status: AC
  Administered 2023-05-27: 12.5 mg via ORAL
  Filled 2023-05-27: qty 1

## 2023-05-27 MED ORDER — HYDRALAZINE HCL 20 MG/ML IJ SOLN
10.0000 mg | Freq: Four times a day (QID) | INTRAMUSCULAR | Status: DC | PRN
  Administered 2023-05-27: 10 mg via INTRAVENOUS
  Filled 2023-05-27 (×2): qty 1

## 2023-05-27 MED ORDER — HYDRALAZINE HCL 50 MG PO TABS
50.0000 mg | ORAL_TABLET | Freq: Three times a day (TID) | ORAL | Status: DC
  Administered 2023-05-27 – 2023-05-31 (×11): 50 mg via ORAL
  Filled 2023-05-27 (×12): qty 1

## 2023-05-27 MED ORDER — MUPIROCIN 2 % EX OINT
1.0000 | TOPICAL_OINTMENT | Freq: Two times a day (BID) | CUTANEOUS | Status: DC
  Filled 2023-05-27: qty 22

## 2023-05-27 NOTE — Progress Notes (Signed)
[facility], [place] 05/27/23  Subjective:   Hospital day # 4  Patient seen resting in bed Complaining of headache Urine output acceptable States she was able to walk around nurses station Reports shortness of breath with exertion  Renal: 05/26 0701 - 05/27 0700 In: 960 [P.O.:960] Out: -  Lab Results  Component Value Date   CREATININE 4.33 (H) 05/27/2023   CREATININE 4.16 (H) 05/26/2023   CREATININE 4.02 (H) 05/25/2023     Objective:  Vital signs in last 24 hours:  Temp:  [98 F (36.7 C)-99.3 F (37.4 C)] 99.3 F (37.4 C) (05/27 0801) Pulse Rate:  [78-90] 90 (05/27 0801) Resp:  [18-19] 18 (05/27 0801) BP: (144-194)/(71-84) 174/71 (05/27 1144) SpO2:  [99 %-100 %] 100 % (05/27 0801)  Weight change:  Filed Weights   05/23/23 0714  Weight: 100.7 kg    Intake/Output:    Intake/Output Summary (Last 24 hours) at 05/27/2023 1302 Last data filed at 05/26/2023 2300 Gross per 24 hour  Intake 720 ml  Output --  Net 720 ml      Physical Exam: General: No acute distress, sitting at bedside  HEENT Moist oral mucous membranes  Pulm/lungs Normal breathing effort on room air, clear to auscultation  CVS/Heart Regular, no rub  Abdomen:  Soft, obese  Extremities: 2+ pitting edema bilaterally  Neurologic: Alert, oriented  Skin: Warm, dry          Basic Metabolic Panel:  Recent Labs  Lab 05/23/23 0717 05/24/23 0430 05/25/23 0408 05/26/23 0410 05/27/23 0415  NA 143 141 140 140 138  K 3.8 3.6 3.7 3.7 3.8  CL 111 109 108 108 111  CO2 20* 22 21* 24 22  GLUCOSE 127* 159* 177* 164* 165*  BUN 65* 67* 65* 70* 70*  CREATININE 4.06* 4.22* 4.02* 4.16* 4.33*  CALCIUM 8.7* 8.5* 8.6* 8.6* 8.8*  PHOS  --   --  4.6  --   --       CBC: Recent Labs  Lab 05/23/23 0717 05/24/23 0430 05/25/23 0408 05/26/23 0410 05/27/23 0415  WBC 7.5 6.3 7.4 6.1 6.2  NEUTROABS  --   --  4.6 4.2 4.6  HGB 8.7* 7.7* 7.7* 8.0* 8.2*  HCT 27.2* 24.3* 24.2*  25.3* 25.8*  MCV 91.3 90.7 90.3 91.3 90.8  PLT 294 248 249 260 242       Lab Results  Component Value Date   HEPBSAG NON REACTIVE 01/14/2021   HEPBSAB Reactive (A) 01/14/2021      Microbiology:  No results found for this or any previous visit (from the past 240 hour(s)).  Coagulation Studies: No results for input(s): "LABPROT", "INR" in the last 72 hours.  Urinalysis: No results for input(s): "COLORURINE", "LABSPEC", "PHURINE", "GLUCOSEU", "HGBUR", "BILIRUBINUR", "KETONESUR", "PROTEINUR", "UROBILINOGEN", "NITRITE", "LEUKOCYTESUR" in the last 72 hours.  Invalid input(s): "APPERANCEUR"    Imaging: No results found.   Medications:    sodium chloride      allopurinol  100 mg Oral Daily   aspirin EC  81 mg Oral Daily   carvedilol  12.5 mg Oral BID AC   furosemide  80 mg Oral Daily   heparin  5,000 Units Subcutaneous Q8H   hydrALAZINE  50 mg Oral Q8H   insulin aspart  0-9 Units Subcutaneous TID WC   isosorbide mononitrate  30 mg Oral BID   loratadine  10 mg Oral Daily   pantoprazole  40 mg Oral Daily   sodium chloride flush  3 mL  Intravenous Q12H   sodium chloride, acetaminophen, albuterol, hydrALAZINE, ondansetron **OR** ondansetron (ZOFRAN) IV, sodium chloride flush  Assessment/ Plan:  62 y.o. female with medical problems of obesity, asthma, diabetes, chronic kidney disease, hypertension   admitted on 05/23/2023 for Anasarca [R60.1] AKI (acute kidney injury) (HCC) [N17.9] Acute on chronic congestive heart failure, unspecified heart failure type (HCC) [I50.9]  Acute kidney injury on chronic kidney disease st 4 versus progression of chronic kidney disease to stage V. CKD risk factors include diabetes, hypertension, obesity, use of nonsteroidals. Renal ultrasound negative for obstruction Awaiting phosphorus with am labs  Creatinine continues to rise. Due to little improvement with renal in comparison to when patient arrived. We feel hemodialysis would be  appropriate at this time.Discussed with patient the reason for this decision, further diuresis will hinder kidney function. Patient agreeable to proceed with dialysis. Will consult vascular surgery for permcath placement tomorrow. Will initiate dialysis at that time.      Hypertension with lower extremity edema Patient has allergy to ACE inhibitor's.  IV furosemide used intermittently.  Blood pressure remains elevated at times.  Lower extremity edema remains present with dyspnea on exertion.  IV furosemide last given on 05/26/2023.  Blood pressure elevated.    Type 2 diabetes with CKD Agree with assessing hemoglobin A1c. Not a candidate for SGLT2 inhibitor due to low GFR at this time.  Primary team managing sliding scale insulin.   Anemia of chronic kidney disease Hemoglobin slightly improved to 8.2 Iron studies appropriate.       LOS: 4 Wendee Beavers 5/27/20241:02 PM  [address] [place], [place] [phone]

## 2023-05-27 NOTE — Consult Note (Signed)
[facility] [place]  Patient name: Brianna [name] MRN: [id number] DOB: 04/11/1961 Sex: female   REQUESTING PROVIDER:   Hospital service   REASON FOR CONSULT:    Dialysis access  HISTORY OF PRESENT ILLNESS:   Brianna [name] is a 62 y.o. female, who was admitted on 05/23/2023 with shortness of breath.  She has a history of obesity, hypertension, type 2 diabetes and stage IV renal disease.  She has had worsening shortness of breath over the past several weeks.  She also has worsening lower extremity edema.  Initiation of dialysis is planned.  PAST MEDICAL HISTORY    Past Medical History:  Diagnosis Date   Acid reflux    Anasarca    Asthma    Chronic heart failure with preserved ejection fraction (HFpEF) (HCC)    a. 12/2020 Echo: EF 70-75%, no rwma, mild LVH, GrI DD, nl RV fxn, triv MR/AI.   CKD (chronic kidney disease), stage IV (HCC)    Diabetes mellitus without complication (HCC)    Hypertension    Morbid obesity (HCC)      FAMILY HISTORY   Family History  Problem Relation Age of Onset   Breast cancer Neg Hx     SOCIAL HISTORY:   Social History   Socioeconomic History   Marital status: Single    Spouse name: Not on file   Number of children: Not on file   Years of education: Not on file   Highest education level: Not on file  Occupational History   Not on file  Tobacco Use   Smoking status: Never   Smokeless tobacco: Never  Substance and Sexual Activity   Alcohol use: Never   Drug use: Never   Sexual activity: Not Currently  Other Topics Concern   Not on file  Social History Narrative   Not on file   Social Determinants of Health   Financial Resource Strain: Not on file  Food Insecurity: No Food Insecurity (05/23/2023)   Hunger Vital Sign    Worried About Running Out of Food in the Last Year: Never true    Ran Out of Food in the Last Year: Never true  Transportation Needs: No  Transportation Needs (05/23/2023)   PRAPARE - Administrator, Civil Service (Medical): No    Lack of Transportation (Non-Medical): No  Physical Activity: Not on file  Stress: Not on file  Social Connections: Not on file  Intimate Partner Violence: Not At Risk (05/23/2023)   Humiliation, Afraid, Rape, and Kick questionnaire    Fear of Current or Ex-Partner: No    Emotionally Abused: No    Physically Abused: No    Sexually Abused: No    ALLERGIES:    Allergies  Allergen Reactions   Ace Inhibitors Anaphylaxis   Lisinopril Anaphylaxis   Lipitor [Atorvastatin] Other (See Comments)    Muscle aches    CURRENT MEDICATIONS:    Current Facility-Administered Medications  Medication Dose Route Frequency Provider Last Rate Last Admin   0.9 %  sodium chloride infusion  250 mL Intravenous PRN Floydene Flock, MD       acetaminophen (TYLENOL) tablet 650 mg  650 mg Oral Q6H PRN Floydene Flock, MD   650 mg at 05/27/23 0948   albuterol (PROVENTIL) (2.5 MG/3ML) 0.083% nebulizer solution 2.5 mg  2.5 mg Nebulization Q6H PRN Rosezetta Schlatter T, MD   2.5 mg at 05/27/23 1537   allopurinol (ZYLOPRIM) tablet 100 mg  100 mg  Oral Daily Loyce Dys, MD   100 mg at 05/27/23 0818   aspirin EC tablet 81 mg  81 mg Oral Daily Floydene Flock, MD   81 mg at 05/27/23 0818   carvedilol (COREG) tablet 12.5 mg  12.5 mg Oral BID AC Loyce Dys, MD   12.5 mg at 05/27/23 1749   furosemide (LASIX) tablet 80 mg  80 mg Oral Daily Floydene Flock, MD   80 mg at 05/27/23 0818   heparin injection 5,000 Units  5,000 Units Subcutaneous Q8H Floydene Flock, MD   5,000 Units at 05/27/23 1323   hydrALAZINE (APRESOLINE) injection 10 mg  10 mg Intravenous Q6H PRN Loyce Dys, MD   10 mg at 05/27/23 0945   hydrALAZINE (APRESOLINE) tablet 50 mg  50 mg Oral Q8H Loyce Dys, MD   50 mg at 05/27/23 1323   insulin aspart (novoLOG) injection 0-9 Units  0-9 Units Subcutaneous TID WC Floydene Flock, MD   1 Units at  05/27/23 1750   isosorbide mononitrate (IMDUR) 24 hr tablet 30 mg  30 mg Oral BID Antonieta Iba, MD   30 mg at 05/27/23 0818   loratadine (CLARITIN) tablet 10 mg  10 mg Oral Daily Floydene Flock, MD   10 mg at 05/27/23 0818   ondansetron (ZOFRAN) tablet 4 mg  4 mg Oral Q6H PRN Floydene Flock, MD       Or   ondansetron [facility]) injection 4 mg  4 mg Intravenous Q6H PRN Floydene Flock, MD       pantoprazole (PROTONIX) EC tablet 40 mg  40 mg Oral Daily Floydene Flock, MD   40 mg at 05/27/23 0818   sodium chloride flush (NS) 0.9 % injection 3 mL  3 mL Intravenous Q12H Floydene Flock, MD   3 mL at 05/27/23 1216   sodium chloride flush (NS) 0.9 % injection 3 mL  3 mL Intravenous PRN Floydene Flock, MD        REVIEW OF SYSTEMS:   [X]  denotes positive finding, [ ]  denotes negative finding Cardiac  Comments:  Chest pain or chest pressure:    Shortness of breath upon exertion: x   Short of breath when lying flat: x   Irregular heart rhythm:        Vascular    Pain in calf, thigh, or hip brought on by ambulation:    Pain in feet at night that wakes you up from your sleep:     Blood clot in your veins:    Leg swelling:  x       Pulmonary    Oxygen at home:    Productive cough:     Wheezing:         Neurologic    Sudden weakness in arms or legs:     Sudden numbness in arms or legs:     Sudden onset of difficulty speaking or slurred speech:    Temporary loss of vision in one eye:     Problems with dizziness:         Gastrointestinal    Blood in stool:      Vomited blood:         Genitourinary    Burning when urinating:     Blood in urine:        Psychiatric    Major depression:         Hematologic    Bleeding problems:  Problems with blood clotting too easily:        Skin    Rashes or ulcers:        Constitutional    Fever or chills:     PHYSICAL EXAM:   Vitals:   05/27/23 0801 05/27/23 0922 05/27/23 1144 05/27/23 1545  BP: (!) 188/84 (!) 194/79 (!)  174/71 (!) 170/78  Pulse: 90   96  Resp: 18   16  Temp: 99.3 F (37.4 C)     TempSrc:      SpO2: 100%   100%  Weight:      Height:        GENERAL: The patient is a well-nourished female, in no acute distress. The vital signs are documented above. CARDIAC: There is a regular rate and rhythm. PULMONARY: Nonlabored respirations ABDOMEN: Soft and non-tender  MUSCULOSKELETAL: There are no major deformities or cyanosis. NEUROLOGIC: No focal weakness or paresthesias are detected. SKIN: There are no ulcers or rashes noted. PSYCHIATRIC: The patient has a normal affect.  STUDIES:   none  ASSESSMENT and PLAN   End-stage renal disease: The patient is having issues with shortness of breath and edema.  She has had worsening renal function and initiation of dialysis has been recommended.  We were consulted for tunneled dialysis catheter.  I discussed the details of the procedure with the patient and she is willing to proceed.  She will be n.p.o. after midnight with plans to do this tomorrow.   Charlena Cross, MD, FACS [facility] [phone] Pager [phone]

## 2023-05-27 NOTE — H&P (View-Only) (Signed)
 [facility]ist [facility] [place]  Patient name: Brianna [name] MRN: [id number] DOB: 09/16/1961 Sex: female   REQUESTING PROVIDER:   Hospital service   REASON FOR CONSULT:    Dialysis access  HISTORY OF PRESENT ILLNESS:   Brianna [name] is a 61 y.o. female, who was admitted on 05/23/2023 with shortness of breath.  She has a history of obesity, hypertension, type 2 diabetes and stage IV renal disease.  She has had worsening shortness of breath over the past several weeks.  She also has worsening lower extremity edema.  Initiation of dialysis is planned.  PAST MEDICAL HISTORY    Past Medical History:  Diagnosis Date   Acid reflux    Anasarca    Asthma    Chronic heart failure with preserved ejection fraction (HFpEF) (HCC)    a. 12/2020 Echo: EF 70-75%, no rwma, mild LVH, GrI DD, nl RV fxn, triv MR/AI.   CKD (chronic kidney disease), stage IV (HCC)    Diabetes mellitus without complication (HCC)    Hypertension    Morbid obesity (HCC)      FAMILY HISTORY   Family History  Problem Relation Age of Onset   Breast cancer Neg Hx     SOCIAL HISTORY:   Social History   Socioeconomic History   Marital status: Single    Spouse name: Not on file   Number of children: Not on file   Years of education: Not on file   Highest education level: Not on file  Occupational History   Not on file  Tobacco Use   Smoking status: Never   Smokeless tobacco: Never  Substance and Sexual Activity   Alcohol use: Never   Drug use: Never   Sexual activity: Not Currently  Other Topics Concern   Not on file  Social History Narrative   Not on file   Social Determinants of Health   Financial Resource Strain: Not on file  Food Insecurity: No Food Insecurity (05/23/2023)   Hunger Vital Sign    Worried About Running Out of Food in the Last Year: Never true    Ran Out of Food in the Last Year: Never true  Transportation Needs: No  Transportation Needs (05/23/2023)   PRAPARE - Transportation    Lack of Transportation (Medical): No    Lack of Transportation (Non-Medical): No  Physical Activity: Not on file  Stress: Not on file  Social Connections: Not on file  Intimate Partner Violence: Not At Risk (05/23/2023)   Humiliation, Afraid, Rape, and Kick questionnaire    Fear of Current or Ex-Partner: No    Emotionally Abused: No    Physically Abused: No    Sexually Abused: No    ALLERGIES:    Allergies  Allergen Reactions   Ace Inhibitors Anaphylaxis   Lisinopril Anaphylaxis   Lipitor [Atorvastatin] Other (See Comments)    Muscle aches    CURRENT MEDICATIONS:    Current Facility-Administered Medications  Medication Dose Route Frequency Provider Last Rate Last Admin   0.9 %  sodium chloride infusion  250 mL Intravenous PRN Newton, Steven J, MD       acetaminophen (TYLENOL) tablet 650 mg  650 mg Oral Q6H PRN Newton, Steven J, MD   650 mg at 05/27/23 0948   albuterol (PROVENTIL) (2.5 MG/3ML) 0.083% nebulizer solution 2.5 mg  2.5 mg Nebulization Q6H PRN Djan, Prince T, MD   2.5 mg at 05/27/23 1537   allopurinol (ZYLOPRIM) tablet 100 mg  100 mg   Oral Daily Djan, Prince T, MD   100 mg at 05/27/23 0818   aspirin EC tablet 81 mg  81 mg Oral Daily Newton, Steven J, MD   81 mg at 05/27/23 0818   carvedilol (COREG) tablet 12.5 mg  12.5 mg Oral BID AC Djan, Prince T, MD   12.5 mg at 05/27/23 1749   furosemide (LASIX) tablet 80 mg  80 mg Oral Daily Newton, Steven J, MD   80 mg at 05/27/23 0818   heparin injection 5,000 Units  5,000 Units Subcutaneous Q8H Newton, Steven J, MD   5,000 Units at 05/27/23 1323   hydrALAZINE (APRESOLINE) injection 10 mg  10 mg Intravenous Q6H PRN Djan, Prince T, MD   10 mg at 05/27/23 0945   hydrALAZINE (APRESOLINE) tablet 50 mg  50 mg Oral Q8H Djan, Prince T, MD   50 mg at 05/27/23 1323   insulin aspart (novoLOG) injection 0-9 Units  0-9 Units Subcutaneous TID WC Newton, Steven J, MD   1 Units at  05/27/23 1750   isosorbide mononitrate (IMDUR) 24 hr tablet 30 mg  30 mg Oral BID Gollan, Timothy J, MD   30 mg at 05/27/23 0818   loratadine (CLARITIN) tablet 10 mg  10 mg Oral Daily Newton, Steven J, MD   10 mg at 05/27/23 0818   ondansetron (ZOFRAN) tablet 4 mg  4 mg Oral Q6H PRN Newton, Steven J, MD       Or   ondansetron (ZOFRAN) injection 4 mg  4 mg Intravenous Q6H PRN Newton, Steven J, MD       pantoprazole (PROTONIX) EC tablet 40 mg  40 mg Oral Daily Newton, Steven J, MD   40 mg at 05/27/23 0818   sodium chloride flush (NS) 0.9 % injection 3 mL  3 mL Intravenous Q12H Newton, Steven J, MD   3 mL at 05/27/23 1216   sodium chloride flush (NS) 0.9 % injection 3 mL  3 mL Intravenous PRN Newton, Steven J, MD        REVIEW OF SYSTEMS:   [X] denotes positive finding, [ ] denotes negative finding Cardiac  Comments:  Chest pain or chest pressure:    Shortness of breath upon exertion: x   Short of breath when lying flat: x   Irregular heart rhythm:        Vascular    Pain in calf, thigh, or hip brought on by ambulation:    Pain in feet at night that wakes you up from your sleep:     Blood clot in your veins:    Leg swelling:  x       Pulmonary    Oxygen at home:    Productive cough:     Wheezing:         Neurologic    Sudden weakness in arms or legs:     Sudden numbness in arms or legs:     Sudden onset of difficulty speaking or slurred speech:    Temporary loss of vision in one eye:     Problems with dizziness:         Gastrointestinal    Blood in stool:      Vomited blood:         Genitourinary    Burning when urinating:     Blood in urine:        Psychiatric    Major depression:         Hematologic    Bleeding problems:      Problems with blood clotting too easily:        Skin    Rashes or ulcers:        Constitutional    Fever or chills:     PHYSICAL EXAM:   Vitals:   05/27/23 0801 05/27/23 0922 05/27/23 1144 05/27/23 1545  BP: (!) 188/84 (!) 194/79 (!)  174/71 (!) 170/78  Pulse: 90   96  Resp: 18   16  Temp: 99.3 F (37.4 C)     TempSrc:      SpO2: 100%   100%  Weight:      Height:        GENERAL: The patient is a well-nourished female, in no acute distress. The vital signs are documented above. CARDIAC: There is a regular rate and rhythm. PULMONARY: Nonlabored respirations ABDOMEN: Soft and non-tender  MUSCULOSKELETAL: There are no major deformities or cyanosis. NEUROLOGIC: No focal weakness or paresthesias are detected. SKIN: There are no ulcers or rashes noted. PSYCHIATRIC: The patient has a normal affect.  STUDIES:   none  ASSESSMENT and PLAN   End-stage renal disease: The patient is having issues with shortness of breath and edema.  She has had worsening renal function and initiation of dialysis has been recommended.  We were consulted for tunneled dialysis catheter.  I discussed the details of the procedure with the patient and she is willing to proceed.  She will be n.p.o. after midnight with plans to do this tomorrow.   Wells Denora Wysocki, IV, MD, FACS [facility] [place] Tel [phone] Pager [phone]  

## 2023-05-27 NOTE — Progress Notes (Signed)
Progress Note   Patient: Brianna [name] ZOX:[id number] DOB: 1961/10/25 DOA: 05/23/2023     4 DOS: the patient was seen and examined on 05/27/2023      Subjective:  Patient seen and examined at bedside this morning Renal function continue to worsen Nephrologist had discussion with patient and have reached  agreement for hemodialysis initiation Denies nausea vomiting chest pain cough or urinary complaints   Brief hospital course: From HPI "Brianna [name] is a 62 y.o. female with medical history significant of obesity, asthma, hypertension, type 2 diabetes, stage IV CKD presenting with anasarca, acute on chronic stage IV CKD.  Patient reports worsening shortness of breath over multiple weeks.  Noted to have been admitted back in January 2022 with similar issues including volume overload and edema.  Patient reports taking 80 mg of Lasix on a regular basis.  Still worsening lower extremity swelling.  Positive orthopnea.  No chest pain.  Patient does admit to eating a high salt diet as well as taking BC powders on a regular basis for bilateral knee pain.  No hemiparesis or confusion.  Per the patient, her blood sugars have been well-controlled. Presented to the ER afebrile, blood pressure 150s over 110s, satting well on room air.  White count 7.5, hemoglobin 8.7, platelets 294, creatinine of 4.1, glucose 127. BNP WNL.  Chest x-ray grossly stable apart from enlarged cardiac silhouette."   Assessment and Plan: * Anasarca Generalized body edema in the setting of acute on chronic stage IV CKD and acute on chronic diastolic CHF Nephrologist managing Lasix Cardiologist on board we appreciate input Echo done in this hospitalization shows ejection fraction 60 to 65%   Acute renal failure superimposed on stage 4 chronic kidney disease (HCC) Baseline creatinine around 2-2.8 with GFR's in the 20s Creatinine 4 today with GFR of 12  Noted regular BC powder use and high salt intake  + anasarca   Still making some urine  Lasix has been discontinued by nephrologist Dr. Thedore Mins w/ nephrology consulted for formal recommendations.  At this time nephrology have made decision for initiation of hemodialysis after dialysis catheter placement   (HFpEF) heart failure with preserved ejection fraction [facility]) 2D echo January 2022 with a EF of 7075% as well as grade 1 diastolic dysfunction Noted anasarca and volume overload on exam in setting of acute on chronic stage IV CKD BNP stable Positive cardiomegaly on chest x-ray Nephrologist managing diuretics at this time Cardiologist on board we appreciate input     Asthma Stable from respiratory standpoint Continue home inhaler   Hypertension BP stable  Titrate home regimen      Type 2 diabetes mellitus without complication, with long-term current use of insulin (HCC) Continue current insulin therapy Continue glucose monitoring      Advance Care Planning:   Code Status: Full Code    Consults: Dr. Thedore Mins w/ nephrology    Family Communication: No family at the bedside        Physical Exam: Appearance: She is obese.  HENT:     Head: Normocephalic.     Mouth/Throat:     Mouth: Mucous membranes are moist.  Eyes:     Pupils: Pupils are equal, round, and reactive to light.  Cardiovascular:     Rate and Rhythm: Normal rate and regular rhythm.  Pulmonary:     Effort: Pulmonary effort is normal.  Abdominal:     General: Bowel sounds are normal.  Musculoskeletal:     Comments: 1+ pitting edema bilaterally  Neurological:     General: No focal deficit present.  Psychiatric:        Mood and Affect: Mood normal.    Data Reviewed: I have reviewed patient's laboratory results today showing worsening renal function with creatinine 4.3   Time spent: 43 minutes  Vitals:   05/27/23 0801 05/27/23 0922 05/27/23 1144 05/27/23 1545  BP: (!) 188/84 (!) 194/79 (!) 174/71 (!) 170/78  Pulse: 90   96  Resp: 18   16  Temp: 99.3 F (37.4 C)      TempSrc:      SpO2: 100%   100%  Weight:      Height:       Author: Loyce Dys, MD 05/27/2023 4:19 PM  For on call review [email or web address].

## 2023-05-28 ENCOUNTER — Encounter: Payer: Self-pay | Admitting: Family Medicine

## 2023-05-28 ENCOUNTER — Inpatient Hospital Stay: Payer: BC Managed Care – PPO

## 2023-05-28 ENCOUNTER — Encounter
Admission: EM | Disposition: A | Discharge status: Home / Self Care | Payer: Self-pay | Source: Home / Self Care | Attending: Internal Medicine

## 2023-05-28 DIAGNOSIS — R601 Generalized edema: Secondary | ICD-10-CM | POA: Diagnosis not present

## 2023-05-28 DIAGNOSIS — Z992 Dependence on renal dialysis: Secondary | ICD-10-CM

## 2023-05-28 DIAGNOSIS — N186 End stage renal disease: Secondary | ICD-10-CM | POA: Diagnosis not present

## 2023-05-28 HISTORY — PX: DIALYSIS/PERMA CATHETER INSERTION: CATH118288

## 2023-05-28 LAB — URINALYSIS, COMPLETE (UACMP) WITH MICROSCOPIC
Bilirubin Urine: NEGATIVE
Glucose, UA: 50 mg/dL — AB
Hgb urine dipstick: NEGATIVE
Ketones, ur: 5 mg/dL — AB
Leukocytes,Ua: NEGATIVE
Nitrite: NEGATIVE
Protein, ur: >=300 mg/dL — AB
Specific Gravity, Urine: 1.014 (ref 1.005–1.030)
pH: 6 (ref 5.0–8.0)

## 2023-05-28 LAB — GLUCOSE, CAPILLARY
Glucose-Capillary: 128 mg/dL — ABNORMAL HIGH (ref 70–99)
Glucose-Capillary: 120 mg/dL — ABNORMAL HIGH (ref 70–99)
Glucose-Capillary: 155 mg/dL — ABNORMAL HIGH (ref 70–99)
Glucose-Capillary: 187 mg/dL — ABNORMAL HIGH (ref 70–99)

## 2023-05-28 LAB — BASIC METABOLIC PANEL
Anion gap: 12 (ref 5–15)
BUN: 61 mg/dL — ABNORMAL HIGH (ref 8–23)
CO2: 19 mmol/L — ABNORMAL LOW (ref 22–32)
Calcium: 8.7 mg/dL — ABNORMAL LOW (ref 8.9–10.3)
Chloride: 107 mmol/L (ref 98–111)
Creatinine, Ser: 4.24 mg/dL — ABNORMAL HIGH (ref 0.44–1.00)
GFR, Estimated: 11 mL/min — ABNORMAL LOW (ref >60)
Glucose, Bld: 137 mg/dL — ABNORMAL HIGH (ref 70–99)
Potassium: 3.8 mmol/L (ref 3.5–5.1)
Sodium: 138 mmol/L (ref 135–145)

## 2023-05-28 LAB — CBC
HCT: 25.4 % — ABNORMAL LOW (ref 36.0–46.0)
Hemoglobin: 8 g/dL — ABNORMAL LOW (ref 12.0–15.0)
MCH: 29.1 pg (ref 26.0–34.0)
MCHC: 31.5 g/dL (ref 30.0–36.0)
MCV: 92.4 fL (ref 80.0–100.0)
Platelets: 229 10*3/uL (ref 150–400)
RBC: 2.75 MIL/uL — ABNORMAL LOW (ref 3.87–5.11)
RDW: 16 % — ABNORMAL HIGH (ref 11.5–15.5)
WBC: 3.3 10*3/uL — ABNORMAL LOW (ref 4.0–10.5)
nRBC: 0 % (ref 0.0–0.2)

## 2023-05-28 LAB — HEPATITIS B SURFACE ANTIGEN: Hepatitis B Surface Ag: NONREACTIVE

## 2023-05-28 LAB — HEPATITIS B CORE ANTIBODY, TOTAL: Hep B Core Total Ab: NONREACTIVE

## 2023-05-28 SURGERY — DIALYSIS/PERMA CATHETER INSERTION
Anesthesia: Moderate Sedation

## 2023-05-28 MED ORDER — PENTAFLUOROPROP-TETRAFLUOROETH EX AERO: 1.0000 | INHALATION_SPRAY | CUTANEOUS | Status: DC | PRN

## 2023-05-28 MED ORDER — CEFAZOLIN SODIUM-DEXTROSE 1-4 GM/50ML-% IV SOLN: 1.0000 g | Freq: Once | INTRAVENOUS | Status: DC

## 2023-05-28 MED ORDER — MIDAZOLAM HCL 2 MG/2ML IJ SOLN
INTRAMUSCULAR | Status: DC | PRN
  Administered 2023-05-28: .5 mg via INTRAVENOUS
  Administered 2023-05-28: 1 mg via INTRAVENOUS

## 2023-05-28 MED ORDER — LIDOCAINE-PRILOCAINE 2.5-2.5 % EX CREA: 1.0000 | TOPICAL_CREAM | CUTANEOUS | Status: DC | PRN

## 2023-05-28 MED ORDER — HEPARIN SODIUM (PORCINE) 1000 UNIT/ML DIALYSIS: 1000.0000 [IU] | INTRAMUSCULAR | Status: DC | PRN

## 2023-05-28 MED ORDER — ALTEPLASE 2 MG IJ SOLR: 2.0000 mg | Freq: Once | INTRAMUSCULAR | Status: DC | PRN

## 2023-05-28 MED ORDER — LIDOCAINE HCL (PF) 1 % IJ SOLN: 5.0000 mL | INTRAMUSCULAR | Status: DC | PRN

## 2023-05-28 MED ORDER — CEFAZOLIN SODIUM-DEXTROSE 1-4 GM/50ML-% IV SOLN
1.0000 g | Freq: Once | INTRAVENOUS | Status: AC
  Administered 2023-05-28: 1 g via INTRAVENOUS
  Filled 2023-05-28: qty 50

## 2023-05-28 MED ORDER — ANTICOAGULANT SODIUM CITRATE 4% (200MG/5ML) IV SOLN: 5.0000 mL | Status: DC | PRN

## 2023-05-28 MED ORDER — CEFAZOLIN SODIUM-DEXTROSE 1-4 GM/50ML-% IV SOLN
INTRAVENOUS | Status: AC
  Filled 2023-05-28: qty 50

## 2023-05-28 MED ORDER — FENTANYL CITRATE (PF) 100 MCG/2ML IJ SOLN
INTRAMUSCULAR | Status: DC | PRN
  Administered 2023-05-28 (×2): 25 ug via INTRAVENOUS

## 2023-05-28 MED ORDER — FENTANYL CITRATE (PF) 100 MCG/2ML IJ SOLN
INTRAMUSCULAR | Status: AC
  Filled 2023-05-28: qty 2

## 2023-05-28 MED ORDER — CHLORHEXIDINE GLUCONATE CLOTH 2 % EX PADS
6.0000 | MEDICATED_PAD | Freq: Every day | CUTANEOUS | Status: DC
  Administered 2023-05-28 – 2023-05-31 (×4): 6 via TOPICAL

## 2023-05-28 MED ORDER — PNEUMOCOCCAL 20-VAL CONJ VACC 0.5 ML IM SUSY
0.5000 mL | PREFILLED_SYRINGE | INTRAMUSCULAR | Status: AC
  Administered 2023-05-29: 0.5 mL via INTRAMUSCULAR
  Filled 2023-05-28: qty 0.5

## 2023-05-28 MED ORDER — MIDAZOLAM HCL 2 MG/2ML IJ SOLN
INTRAMUSCULAR | Status: AC
  Filled 2023-05-28: qty 2

## 2023-05-28 MED ORDER — MUPIROCIN 2 % EX OINT
1.0000 | TOPICAL_OINTMENT | Freq: Two times a day (BID) | CUTANEOUS | Status: DC
  Administered 2023-05-28 – 2023-05-31 (×7): 1 via NASAL
  Filled 2023-05-28: qty 22

## 2023-05-28 SURGICAL SUPPLY — 12 items
BIOPATCH RED 1 DISK 7.0 (GAUZE/BANDAGES/DRESSINGS) IMPLANT
CATH GLIDEPATH RETRO 14.5X19 (CATHETERS) IMPLANT
COVER PROBE ULTRASOUND 5X96 (MISCELLANEOUS) IMPLANT
DERMABOND ADVANCED .7 DNX12 (GAUZE/BANDAGES/DRESSINGS) IMPLANT
DRAPE INCISE IOBAN 66X45 STRL (DRAPES) IMPLANT
GUIDEWIRE SUPER STIFF .035X180 (WIRE) IMPLANT
NDL ENTRY 21GA 7CM ECHOTIP (NEEDLE) IMPLANT
NEEDLE ENTRY 21GA 7CM ECHOTIP (NEEDLE) ×1 IMPLANT
PACK ANGIOGRAPHY (CUSTOM PROCEDURE TRAY) IMPLANT
SET INTRO CAPELLA COAXIAL (SET/KITS/TRAYS/PACK) IMPLANT
SUT MNCRL AB 4-0 PS2 18 (SUTURE) IMPLANT
SUT SILK 0 FSL (SUTURE) IMPLANT

## 2023-05-28 NOTE — Progress Notes (Signed)
Hemodialysis note  Received patient in bed to unit. Alert and oriented.  Informed consent signed and in chart.  Patients HD Cathter dressing is 25% soaked with blood. Reinforced dressings and applied ice pack.   Treatment initiated: 1601 Treatment completed: 1804  Patient tolerated well. Transported back to room, alert without acute distress.  Report given to patient's RN.   Access used: Right Chest HD Catheter Access issues: none   Total UF removed: 0 Medication(s) given:  none  Post HD weight: 92.6 kg   [name]

## 2023-05-28 NOTE — Op Note (Signed)
[facility]   OPERATIVE NOTE     PROCEDURE: 1. Insertion of a right IJ tunneled dialysis catheter. 2. Catheter placement and cannulation under ultrasound and fluoroscopic guidance  PRE-OPERATIVE DIAGNOSIS: end-stage renal requiring hemodialysis  POST-OPERATIVE DIAGNOSIS: same as above  SURGEON: [name]  ANESTHESIA: Conscious sedation was administered under my direct supervision by the interventional radiology RN. IV Versed plus fentanyl were utilized. Continuous ECG, pulse oximetry and blood pressure was monitored throughout the entire procedure.  Conscious sedation was for a total of 25 minutes.  ESTIMATED BLOOD LOSS: Minimal  FINDING(S): 1.  Tips of the catheter in the right atrium on fluoroscopy 2.  No obvious pneumothorax on fluoroscopy  SPECIMEN(S):  none  INDICATIONS:   Brianna [name] is a 62 y.o. female  presents with end stage renal disease.  Therefore, the patient requires a tunneled dialysis catheter placement.  The patient is informed of  the risks catheter placement include but are not limited to: bleeding, infection, central venous injury, pneumothorax, possible venous stenosis, possible malpositioning in the venous system, and possible infections related to long-term catheter presence.  The patient was aware of these risks and agreed to proceed.  DESCRIPTION: The patient was taken back to Special Procedure suite.  Prior to sedation, the patient was given IV antibiotics.  After obtaining adequate sedation, the patient was prepped and draped in the standard fashion for a right IJ tunneled dialysis catheter placement.  Appropriate Time Out is called.     The right neck and chest wall are then infiltrated with 1% Lidocaine with epinepherine.  A 19 cm tip to cuff catheter is then selected, opened on the back table and prepped. Ultrasound is placed in a sterile sleeve.  Under ultrasound guidance, the right internal jugular vein is examined  and is noted to be echolucent and easily compressible indicating patency.   An image is recorded for the permanent record.  The right internal jugular vein is cannulated with the microneedle under direct ultrasound vissualization.  A Microwire followed by a micro sheath is then inserted without difficulty.   A J-wire was then advanced under fluoroscopic guidance into the inferior vena cava and the wire was secured.  Small counter incision was then made at the wire insertion site. A small pocket was fashioned with blunt dissection to allow easier passage of the cuff.  The dilator and peel-away sheath are then advanced over the wire under fluoroscopic guidance. The catheters and advanced through the peel-away sheath. It is approximated to the chest wall after verifying the tips at the atrial caval junction and an exit site is selected.  Small incision is made at the selected exit site and the tunneling device was passed subcutaneously to the counter incision. Catheter is then pulled through the subcutaneous tunnel. The catheter is then verified for tip position under fluoroscopy, transected and the hub assembly connected.    Each port was tested by aspirating and flushing.  No resistance was noted.  Each port was then thoroughly flushed with heparinized saline.  The catheter was secured in placed with two interrupted stitches of 0 silk tied to the catheter.  The counter incision was closed with a U-stitch of 4-0 Monocryl.  The insertion site is then cleaned and sterile bandages applied including a Biopatch.  Each port was then packed with concentrated heparin (1000 Units/mL) at the manufacturer recommended volumes to each port.  Sterile caps were applied to each port.  On completion fluoroscopy, the tips of  the catheter were in the right atrium, and there was no evidence of pneumothorax.  COMPLICATIONS: None  CONDITION: Unchanged   [name] [facility] Office:  [phone]   05/28/2023, 1:34 PM

## 2023-05-28 NOTE — Progress Notes (Signed)
[facility], [place] 05/28/23  Subjective:   Hospital day # 5  Patient seen laying in  bed NPO for vascular procedure Continues to complain of shortness of breath with activity Room air   Renal: 05/27 0701 - 05/28 0700 In: 240 [P.O.:240] Out: -  Lab Results  Component Value Date   CREATININE 4.24 (H) 05/28/2023   CREATININE 4.33 (H) 05/27/2023   CREATININE 4.16 (H) 05/26/2023     Objective:  Vital signs in last 24 hours:  Temp:  [98.3 F (36.8 C)-100.7 F (38.2 C)] 98.3 F (36.8 C) (05/28 1116) Pulse Rate:  [0-96] 78 (05/28 1315) Resp:  [16-34] 20 (05/28 1315) BP: (125-170)/(70-89) 145/76 (05/28 1315) SpO2:  [95 %-100 %] 99 % (05/28 1315) Weight:  [100.7 kg] 100.7 kg (05/28 1116)  Weight change:  Filed Weights   05/23/23 0714 05/28/23 1116  Weight: 100.7 kg 100.7 kg    Intake/Output:    Intake/Output Summary (Last 24 hours) at 05/28/2023 1442 Last data filed at 05/28/2023 1302 Gross per 24 hour  Intake 240 ml  Output --  Net 240 ml      Physical Exam: General: No acute distress, sitting at bedside  HEENT Moist oral mucous membranes  Pulm/lungs Coarse on auscultation  CVS/Heart Regular, no rub  Abdomen:  Soft, obese  Extremities: 2+ pitting edema bilaterally  Neurologic: Alert, oriented  Skin: Warm, dry          Basic Metabolic Panel:  Recent Labs  Lab 05/24/23 0430 05/25/23 0408 05/26/23 0410 05/27/23 0415 05/28/23 0441  NA 141 140 140 138 138  K 3.6 3.7 3.7 3.8 3.8  CL 109 108 108 111 107  CO2 22 21* 24 22 19*  GLUCOSE 159* 177* 164* 165* 137*  BUN 67* 65* 70* 70* 61*  CREATININE 4.22* 4.02* 4.16* 4.33* 4.24*  CALCIUM 8.5* 8.6* 8.6* 8.8* 8.7*  PHOS  --  4.6  --   --   --       CBC: Recent Labs  Lab 05/23/23 0717 05/24/23 0430 05/25/23 0408 05/26/23 0410 05/27/23 0415  WBC 7.5 6.3 7.4 6.1 6.2  NEUTROABS  --   --  4.6 4.2 4.6  HGB 8.7* 7.7* 7.7* 8.0* 8.2*  HCT 27.2* 24.3* 24.2* 25.3* 25.8*   MCV 91.3 90.7 90.3 91.3 90.8  PLT 294 248 249 260 242       Lab Results  Component Value Date   HEPBSAG NON REACTIVE 05/27/2023   HEPBSAB Reactive (A) 01/14/2021      Microbiology:  Recent Results (from the past 240 hour(s))  Surgical PCR screen     Status: Abnormal   Collection Time: 05/27/23  9:05 PM   Specimen: Nasal Mucosa; Nasal Swab  Result Value Ref Range Status   MRSA, PCR NEGATIVE NEGATIVE Final   Staphylococcus aureus POSITIVE (A) NEGATIVE Final    Comment: (NOTE) The Xpert SA Assay (FDA approved for NASAL specimens in patients 66 years of age and older), is one component of a comprehensive surveillance program. It is not intended to diagnose infection nor to guide or monitor treatment. Performed at [facility], [address] [place], [place] [address]     Coagulation Studies: No results for input(s): "LABPROT", "INR" in the last 72 hours.  Urinalysis: Recent Labs    05/28/23 0921  COLORURINE YELLOW*  LABSPEC 1.014  PHURINE 6.0  GLUCOSEU 50*  HGBUR NEGATIVE  BILIRUBINUR NEGATIVE  KETONESUR 5*  PROTEINUR >=300*  NITRITE NEGATIVE  LEUKOCYTESUR  NEGATIVE      Imaging: PERIPHERAL VASCULAR CATHETERIZATION  Result Date: 05/28/2023 See surgical note for result.  DG Chest Port 1 View  Result Date: 05/28/2023 CLINICAL DATA:  Shortness of breath EXAM: PORTABLE CHEST 1 VIEW COMPARISON:  05/23/2023 FINDINGS: Cardiomegaly as seen previously. Aortic atherosclerosis. The right lung is clear. Mild linear atelectasis or scar at the left lower lobe. No evidence of edema or effusion. IMPRESSION: Cardiomegaly. Aortic atherosclerosis. Mild linear atelectasis or scar at the left lower lobe. Electronically Signed   By: Paulina Fusi M.D.   On: 05/28/2023 09:33     Medications:    sodium chloride     anticoagulant sodium citrate      allopurinol  100 mg Oral Daily   aspirin EC  81 mg Oral Daily   carvedilol  12.5 mg Oral BID AC   Chlorhexidine  Gluconate Cloth  6 each Topical Daily   furosemide  80 mg Oral Daily   heparin  5,000 Units Subcutaneous Q8H   hydrALAZINE  50 mg Oral Q8H   insulin aspart  0-9 Units Subcutaneous TID WC   isosorbide mononitrate  30 mg Oral BID   loratadine  10 mg Oral Daily   mupirocin ointment  1 Application Nasal BID   pantoprazole  40 mg Oral Daily   [START ON 05/29/2023] pneumococcal 20-valent conjugate vaccine  0.5 mL Intramuscular Tomorrow-1000   sodium chloride flush  3 mL Intravenous Q12H   sodium chloride, acetaminophen, albuterol, alteplase, anticoagulant sodium citrate, heparin, hydrALAZINE, lidocaine (PF), lidocaine-prilocaine, ondansetron **OR** ondansetron (ZOFRAN) IV, pentafluoroprop-tetrafluoroeth, sodium chloride flush  Assessment/ Plan:  62 y.o. female with medical problems of obesity, asthma, diabetes, chronic kidney disease, hypertension   admitted on 05/23/2023 for Anasarca [R60.1] AKI (acute kidney injury) (HCC) [N17.9] Acute on chronic congestive heart failure, unspecified heart failure type (HCC) [I50.9]  Acute kidney injury on chronic kidney disease st 4 versus progression of chronic kidney disease to stage V. CKD risk factors include diabetes, hypertension, obesity, use of nonsteroidals. Renal ultrasound negative for obstruction Awaiting phosphorus with am labs  Appreciate vascular surgery placing rt chest permcath. Will initiate dialysis today. Patient will receive 3 consecutive days of treatment.   Renal navigator aware of patient and will begin outpatient clinic search.      Hypertension with lower extremity edema Patient has allergy to ACE inhibitor's.  IV furosemide used intermittently.  Blood pressure remains elevated at times.  Lower extremity edema remains present with dyspnea on exertion.  IV furosemide last given on 05/26/2023.  Blood pressure stable for this patient   Type 2 diabetes with CKD Agree with assessing hemoglobin A1c. Not a candidate for SGLT2 inhibitor  due to low GFR at this time.  Well controlled  Primary team managing sliding scale insulin.   Anemia of chronic kidney disease Iron studies appropriate.       LOS: 5 Wendee Beavers 5/28/20242:42 PM  [address] [place], [place] [phone]

## 2023-05-28 NOTE — Progress Notes (Signed)
Progress Note   Patient: Brianna [name] WUJ:[id number] DOB: Dec 17, 1961 DOA: 05/23/2023     5 DOS: the patient was seen and examined on 05/28/2023      Subjective:  Patient seen and examined at bedside this morning Patient had a temperature spike overnight Chest x-ray did not show any findings of infiltrate Urinalysis not suggestive of UTI Fever have since resolved Patient being planned for dialysis catheter placement by vascular surgeon today for initiation of dialysis Denies nausea vomiting chest pain cough or urinary complaints   Brief hospital course: From HPI "Brianna [name] is a 62 y.o. female with medical history significant of obesity, asthma, hypertension, type 2 diabetes, stage IV CKD presenting with anasarca, acute on chronic stage IV CKD.  Patient reports worsening shortness of breath over multiple weeks.  Noted to have been admitted back in January 2022 with similar issues including volume overload and edema.  Patient reports taking 80 mg of Lasix on a regular basis.  Still worsening lower extremity swelling.  Positive orthopnea.  No chest pain.  Patient does admit to eating a high salt diet as well as taking BC powders on a regular basis for bilateral knee pain.  No hemiparesis or confusion.  Per the patient, her blood sugars have been well-controlled. Presented to the ER afebrile, blood pressure 150s over 110s, satting well on room air.  White count 7.5, hemoglobin 8.7, platelets 294, creatinine of 4.1, glucose 127. BNP WNL.  Chest x-ray grossly stable apart from enlarged cardiac silhouette."   Assessment and Plan: * Anasarca Generalized body edema in the setting of acute on chronic stage IV CKD and acute on chronic diastolic CHF Nephrologist managing her Lasix, currently on hold Cardiologist on board we appreciate input Echo done in this hospitalization shows ejection fraction 60 to 65%   Acute renal failure superimposed on stage 4 chronic kidney disease  (HCC) Baseline creatinine around 2-2.8 with GFR's in the 20s Creatinine 4 today with GFR of 12  Noted regular BC powder use and high salt intake  + anasarca  Still making some urine  Lasix has been discontinued by nephrologist At this time nephrology have made decision for initiation of hemodialysis after dialysis catheter placement by vascular surgeon today   (HFpEF) heart failure with preserved ejection fraction (HCC) 2D echo January 2022 with a EF of 7075% as well as grade 1 diastolic dysfunction Noted anasarca and volume overload on exam in setting of acute on chronic stage IV CKD BNP stable Positive cardiomegaly on chest x-ray Nephrologist managing diuretics at this time Cardiologist on board we appreciate input     Asthma Stable from respiratory standpoint Continue home inhaler   Hypertension BP stable  Titrate home regimen      Type 2 diabetes mellitus without complication, with long-term current use of insulin (HCC) Continue current insulin therapy Continue glucose monitoring      Advance Care Planning:   Code Status: Full Code    Consults: Dr. Thedore Mins w/ nephrology, cardiology, vascular surgery   Family Communication: No family at the bedside        Physical Exam: Appearance: She is obese.  HENT:     Head: Normocephalic.     Mouth/Throat:     Mouth: Mucous membranes are moist.  Eyes:     Pupils: Pupils are equal, round, and reactive to light.  Cardiovascular:     Rate and Rhythm: Normal rate and regular rhythm.  Pulmonary:     Effort: Pulmonary effort is normal.  Abdominal:     General: Bowel sounds are normal.  Musculoskeletal:     Comments: 1+ pitting edema bilaterally  Neurological:     General: No focal deficit present.  Psychiatric:        Mood and Affect: Mood normal.     Data Reviewed: I have reviewed patient's laboratory results today showing sodium 138 potassium 3.8 creatinine 4.24   Time spent: 45 minutes      Vitals:   05/28/23  1245 05/28/23 1300 05/28/23 1305 05/28/23 1315  BP: (!) 140/78 (!) 151/78  (!) 145/76  Pulse:  79  78  Resp: (!) 22 (!) 24  20  Temp:      TempSrc:      SpO2: 99% 100% 99% 99%  Weight:      Height:         Author: Loyce Dys, MD 05/28/2023 1:20 PM  For on call review [email or web address].

## 2023-05-28 NOTE — Interval H&P Note (Signed)
History and Physical Interval Note:  05/28/2023 11:43 AM  Brianna [name]  has presented today for surgery, with the diagnosis of End-stage renal disease.  The various methods of treatment have been discussed with the patient and family. After consideration of risks, benefits and other options for treatment, the patient has consented to  Procedure(s): DIALYSIS/PERMA CATHETER INSERTION (N/A) as a surgical intervention.  The patient's history has been reviewed, patient examined, no change in status, stable for surgery.  I have reviewed the patient's chart and labs.  Questions were answered to the patient's satisfaction.     [name]

## 2023-05-29 ENCOUNTER — Encounter: Payer: Self-pay | Admitting: Vascular Surgery

## 2023-05-29 DIAGNOSIS — N170 Acute kidney failure with tubular necrosis: Secondary | ICD-10-CM | POA: Diagnosis not present

## 2023-05-29 DIAGNOSIS — I5033 Acute on chronic diastolic (congestive) heart failure: Secondary | ICD-10-CM | POA: Diagnosis not present

## 2023-05-29 DIAGNOSIS — R601 Generalized edema: Secondary | ICD-10-CM | POA: Diagnosis not present

## 2023-05-29 DIAGNOSIS — D631 Anemia in chronic kidney disease: Secondary | ICD-10-CM | POA: Insufficient documentation

## 2023-05-29 DIAGNOSIS — N184 Chronic kidney disease, stage 4 (severe): Secondary | ICD-10-CM | POA: Diagnosis not present

## 2023-05-29 LAB — CBC WITH DIFFERENTIAL/PLATELET
Abs Immature Granulocytes: 0.02 10*3/uL (ref 0.00–0.07)
Basophils Absolute: 0 10*3/uL (ref 0.0–0.1)
Basophils Relative: 0 %
Eosinophils Absolute: 0 10*3/uL (ref 0.0–0.5)
Eosinophils Relative: 0 %
HCT: 23 % — ABNORMAL LOW (ref 36.0–46.0)
Hemoglobin: 7.2 g/dL — ABNORMAL LOW (ref 12.0–15.0)
Immature Granulocytes: 0 %
Lymphocytes Relative: 28 %
Lymphs Abs: 1.3 10*3/uL (ref 0.7–4.0)
MCH: 28.6 pg (ref 26.0–34.0)
MCHC: 31.3 g/dL (ref 30.0–36.0)
MCV: 91.3 fL (ref 80.0–100.0)
Monocytes Absolute: 0.7 10*3/uL (ref 0.1–1.0)
Monocytes Relative: 16 %
Neutro Abs: 2.5 10*3/uL (ref 1.7–7.7)
Neutrophils Relative %: 56 %
Platelets: 212 10*3/uL (ref 150–400)
RBC: 2.52 MIL/uL — ABNORMAL LOW (ref 3.87–5.11)
RDW: 15.7 % — ABNORMAL HIGH (ref 11.5–15.5)
WBC: 4.5 10*3/uL (ref 4.0–10.5)
nRBC: 0 % (ref 0.0–0.2)

## 2023-05-29 LAB — BASIC METABOLIC PANEL
Anion gap: 10 (ref 5–15)
BUN: 53 mg/dL — ABNORMAL HIGH (ref 8–23)
CO2: 23 mmol/L (ref 22–32)
Calcium: 8.5 mg/dL — ABNORMAL LOW (ref 8.9–10.3)
Chloride: 104 mmol/L (ref 98–111)
Creatinine, Ser: 3.81 mg/dL — ABNORMAL HIGH (ref 0.44–1.00)
GFR, Estimated: 13 mL/min — ABNORMAL LOW (ref >60)
Glucose, Bld: 150 mg/dL — ABNORMAL HIGH (ref 70–99)
Potassium: 3.5 mmol/L (ref 3.5–5.1)
Sodium: 137 mmol/L (ref 135–145)

## 2023-05-29 LAB — GLUCOSE, CAPILLARY
Glucose-Capillary: 154 mg/dL — ABNORMAL HIGH (ref 70–99)
Glucose-Capillary: 127 mg/dL — ABNORMAL HIGH (ref 70–99)
Glucose-Capillary: 125 mg/dL — ABNORMAL HIGH (ref 70–99)
Glucose-Capillary: 157 mg/dL — ABNORMAL HIGH (ref 70–99)

## 2023-05-29 LAB — VITAMIN B12: Vitamin B-12: 264 pg/mL (ref 180–914)

## 2023-05-29 LAB — HEPATITIS B SURFACE ANTIBODY, QUANTITATIVE: Hep B S AB Quant (Post): 159 m[IU]/mL (ref >9.9)

## 2023-05-29 NOTE — Progress Notes (Signed)
Patient transported to HD 

## 2023-05-29 NOTE — Progress Notes (Addendum)
  Progress Note   Patient: Brianna [name] ZOX:[id number] DOB: [birth year] DOA: 05/23/2023     6 DOS: the patient was seen and examined on 05/29/2023   Brief hospital course: Brianna [name] is a 62 y.o. female with medical history significant of obesity, asthma, hypertension, type 2 diabetes, stage IV CKD presenting with anasarca, acute on chronic stage IV CKD.  Patient reports worsening shortness of breath over multiple weeks.  Patient was initially treated with IV Lasix, renal function getting worse, a permacath was placed on 5/28, hemodialysis was started.   Principal Problem:   Anasarca Active Problems:   Acute renal failure superimposed on stage 4 chronic kidney disease (HCC)   (HFpEF) heart failure with preserved ejection fraction (HCC)   Type 2 diabetes mellitus without complication, with long-term current use of insulin (HCC)   Hypertension   Asthma   Anemia due to stage 4 chronic kidney disease (HCC)   Acute on chronic congestive heart failure (HCC)   End stage renal disease (HCC)   Morbidly obese (HCC)   Assessment and Plan: Anasarca secondary to volume overload. Acute on chronic diastolic congestive heart failure. Acute renal failure superimposed on stage IV chronic kidney disease. Anasarca has improved after diuretics and hemodialysis.  Volume status much better.  Patient currently is on scheduled dialysis, pending chair set up to discharge.  Anemia of chronic kidney disease Hemoglobin dropped down to 7.2 no evidence of GI bleed.  Continue to follow transfuse as needed.  Iron levels normal, B12 level ordered, pending results.  Type 2 diabetes. Continue current regimen.  Morbid obesity. BMI 44.18. Diet exercise advised.       Subjective:  Feel much better today, not short of breath.  Physical Exam: Vitals:   05/28/23 1817 05/28/23 2012 05/29/23 0428 05/29/23 0846  BP:  130/70 127/73 (!) 168/89  Pulse:  81 76 72  Resp:  16 16 16   Temp:  98.5 F  (36.9 C) 98.5 F (36.9 C) 98.3 F (36.8 C)  TempSrc:  Oral Oral Oral  SpO2:  100% 100% 99%  Weight: 92.6 kg     Height:       General exam: Appears calm and comfortable, morbidly obese. Respiratory system: Clear to auscultation. Respiratory effort normal. Cardiovascular system: S1 & S2 heard, RRR. No JVD, murmurs, rubs, gallops or clicks. No pedal edema. Gastrointestinal system: Abdomen is nondistended, soft and nontender. No organomegaly or masses felt. Normal bowel sounds heard. Central nervous system: Alert and oriented. No focal neurological deficits. Extremities: Symmetric 5 x 5 power. Skin: No rashes, lesions or ulcers Psychiatry: Judgement and insight appear normal. Mood & affect appropriate.    Data Reviewed:  Lab results reviewed.  Family Communication: None  Disposition: Status is: Inpatient Remains inpatient appropriate because: Severity of disease, unsafe discharge.     Time spent: 35 minutes  Author: Marrion Coy, MD 05/29/2023 12:25 PM  For on call review [email or web address].

## 2023-05-29 NOTE — Hospital Course (Signed)
Brianna [name] is a 62 y.o. female with medical history significant of obesity, asthma, hypertension, type 2 diabetes, stage IV CKD presenting with anasarca, acute on chronic stage IV CKD.  Patient reports worsening shortness of breath over multiple weeks.  Patient was initially treated with IV Lasix, renal function getting worse, a permacath was placed on 5/28, hemodialysis was started.

## 2023-05-29 NOTE — Discharge Planning (Signed)
Following for outpatient placement at [facility].

## 2023-05-29 NOTE — Progress Notes (Addendum)
[facility], [place] 05/29/23  Subjective:   Hospital day # 6  Patient seen sitting up on side of bed Preparing for breakfast States she feels well Ambulates in room   Dialysis received yesterday, tolerated well   Renal: 05/28 0701 - 05/29 0700 In: 530 [P.O.:480; IV Piggyback:50] Out: 0  Lab Results  Component Value Date   CREATININE 3.81 (H) 05/29/2023   CREATININE 4.24 (H) 05/28/2023   CREATININE 4.33 (H) 05/27/2023     Objective:  Vital signs in last 24 hours:  Temp:  [98.3 F (36.8 C)-99 F (37.2 C)] 98.3 F (36.8 C) (05/29 0846) Pulse Rate:  [72-82] 72 (05/29 0846) Resp:  [16-24] 16 (05/29 0846) BP: (107-168)/(65-89) 168/89 (05/29 0846) SpO2:  [97 %-100 %] 99 % (05/29 0846) Weight:  [92.6 kg] 92.6 kg (05/28 1817)  Weight change:  Filed Weights   05/28/23 1116 05/28/23 1558 05/28/23 1817  Weight: 100.7 kg 92.6 kg 92.6 kg    Intake/Output:    Intake/Output Summary (Last 24 hours) at 05/29/2023 1347 Last data filed at 05/29/2023 1030 Gross per 24 hour  Intake 410 ml  Output 0 ml  Net 410 ml      Physical Exam: General: No acute distress, sitting at bedside  HEENT Moist oral mucous membranes  Pulm/lungs Coarse on auscultation  CVS/Heart Regular, no rub  Abdomen:  Soft, obese  Extremities: 2+ pitting edema bilaterally  Neurologic: Alert, oriented  Skin: Warm, dry          Basic Metabolic Panel:  Recent Labs  Lab 05/25/23 0408 05/26/23 0410 05/27/23 0415 05/28/23 0441 05/29/23 0442  NA 140 140 138 138 137  K 3.7 3.7 3.8 3.8 3.5  CL 108 108 111 107 104  CO2 21* 24 22 19* 23  GLUCOSE 177* 164* 165* 137* 150*  BUN 65* 70* 70* 61* 53*  CREATININE 4.02* 4.16* 4.33* 4.24* 3.81*  CALCIUM 8.6* 8.6* 8.8* 8.7* 8.5*  PHOS 4.6  --   --   --   --       CBC: Recent Labs  Lab 05/25/23 0408 05/26/23 0410 05/27/23 0415 05/28/23 1604 05/29/23 0442  WBC 7.4 6.1 6.2 3.3* 4.5  NEUTROABS 4.6 4.2 4.6  --  2.5   HGB 7.7* 8.0* 8.2* 8.0* 7.2*  HCT 24.2* 25.3* 25.8* 25.4* 23.0*  MCV 90.3 91.3 90.8 92.4 91.3  PLT 249 260 242 229 212       Lab Results  Component Value Date   HEPBSAG NON REACTIVE 05/27/2023   HEPBSAB Reactive (A) 01/14/2021      Microbiology:  Recent Results (from the past 240 hour(s))  Surgical PCR screen     Status: Abnormal   Collection Time: 05/27/23  9:05 PM   Specimen: Nasal Mucosa; Nasal Swab  Result Value Ref Range Status   MRSA, PCR NEGATIVE NEGATIVE Final   Staphylococcus aureus POSITIVE (A) NEGATIVE Final    Comment: (NOTE) The Xpert SA Assay (FDA approved for NASAL specimens in patients 26 years of age and older), is one component of a comprehensive surveillance program. It is not intended to diagnose infection nor to guide or monitor treatment. Performed at [facility], [address] [place], [place] [address]     Coagulation Studies: No results for input(s): "LABPROT", "INR" in the last 72 hours.  Urinalysis: Recent Labs    05/28/23 0921  COLORURINE YELLOW*  LABSPEC 1.014  PHURINE 6.0  GLUCOSEU 50*  HGBUR NEGATIVE  BILIRUBINUR NEGATIVE  KETONESUR  5*  PROTEINUR >=300*  NITRITE NEGATIVE  LEUKOCYTESUR NEGATIVE       Imaging: PERIPHERAL VASCULAR CATHETERIZATION  Result Date: 05/28/2023 See surgical note for result.  DG Chest Port 1 View  Result Date: 05/28/2023 CLINICAL DATA:  Shortness of breath EXAM: PORTABLE CHEST 1 VIEW COMPARISON:  05/23/2023 FINDINGS: Cardiomegaly as seen previously. Aortic atherosclerosis. The right lung is clear. Mild linear atelectasis or scar at the left lower lobe. No evidence of edema or effusion. IMPRESSION: Cardiomegaly. Aortic atherosclerosis. Mild linear atelectasis or scar at the left lower lobe. Electronically Signed   By: Paulina Fusi M.D.   On: 05/28/2023 09:33     Medications:    sodium chloride     anticoagulant sodium citrate      allopurinol  100 mg Oral Daily   aspirin EC  81  mg Oral Daily   carvedilol  12.5 mg Oral BID AC   Chlorhexidine Gluconate Cloth  6 each Topical Daily   furosemide  80 mg Oral Daily   heparin  5,000 Units Subcutaneous Q8H   hydrALAZINE  50 mg Oral Q8H   insulin aspart  0-9 Units Subcutaneous TID WC   isosorbide mononitrate  30 mg Oral BID   loratadine  10 mg Oral Daily   mupirocin ointment  1 Application Nasal BID   pantoprazole  40 mg Oral Daily   sodium chloride flush  3 mL Intravenous Q12H   sodium chloride, acetaminophen, albuterol, alteplase, anticoagulant sodium citrate, heparin, hydrALAZINE, lidocaine (PF), lidocaine-prilocaine, ondansetron **OR** ondansetron (ZOFRAN) IV, pentafluoroprop-tetrafluoroeth, sodium chloride flush  Assessment/ Plan:  62 y.o. female with medical problems of obesity, asthma, diabetes, chronic kidney disease, hypertension   admitted on 05/23/2023 for Anasarca [R60.1] AKI (acute kidney injury) (HCC) [N17.9] Acute on chronic congestive heart failure, unspecified heart failure type (HCC) [I50.9]  End stage renal disease requiring hemodialysis This appears to be a progression of kidney disease that now requires hemodialysis.  CKD risk factors include diabetes, hypertension, obesity, use of nonsteroidals. Renal ultrasound negative for obstruction Awaiting phosphorus with am labs  Permcath placed on 05/28/23.  Dialysis initiated after placement, tolerated well.  Receiving second dialysis treatment today, UF 0.5L as tolerated.   Next treatment scheduled for Thursday  Renal navigator aware of patient and will begin outpatient clinic search.      Hypertension with lower extremity edema Patient has allergy to ACE inhibitor's.  IV furosemide used intermittently.  Blood pressure elevated today. Currently receiving Carvedilol, furosemide, hydralazine, and isosorbide.    Type 2 diabetes with CKD Agree with assessing hemoglobin A1c. Not a candidate for SGLT2 inhibitor due to low GFR at this time.  Sliding scale  indulin per primary team  Anemia of chronic kidney disease Iron studies appropriate.      LOS: 6 Wendee Beavers 5/29/20241:47 PM  [facility] [address] [place], [place] [phone]

## 2023-05-29 NOTE — Progress Notes (Signed)
Received patient in bed to unit.    Informed consent signed and in chart.    TX duration:2.5     Transported By hospital transport back to room Hand-off given to patient's nurse. Teofilo Pod RN   Access used: R chest PC Access issues:None    Total UF removed: 1000 Medication(s) given:none Post HD VS: Stable Post HD weight: 92kg    [facility]

## 2023-05-30 DIAGNOSIS — N186 End stage renal disease: Secondary | ICD-10-CM

## 2023-05-30 DIAGNOSIS — N170 Acute kidney failure with tubular necrosis: Secondary | ICD-10-CM | POA: Diagnosis not present

## 2023-05-30 DIAGNOSIS — I5033 Acute on chronic diastolic (congestive) heart failure: Secondary | ICD-10-CM | POA: Diagnosis not present

## 2023-05-30 DIAGNOSIS — R601 Generalized edema: Secondary | ICD-10-CM | POA: Diagnosis not present

## 2023-05-30 LAB — CBC WITH DIFFERENTIAL/PLATELET
Abs Immature Granulocytes: 0.02 10*3/uL (ref 0.00–0.07)
Basophils Absolute: 0 10*3/uL (ref 0.0–0.1)
Basophils Relative: 0 %
Eosinophils Absolute: 0 10*3/uL (ref 0.0–0.5)
Eosinophils Relative: 1 %
HCT: 26 % — ABNORMAL LOW (ref 36.0–46.0)
Hemoglobin: 8.2 g/dL — ABNORMAL LOW (ref 12.0–15.0)
Immature Granulocytes: 0 %
Lymphocytes Relative: 33 %
Lymphs Abs: 1.7 10*3/uL (ref 0.7–4.0)
MCH: 28.7 pg (ref 26.0–34.0)
MCHC: 31.5 g/dL (ref 30.0–36.0)
MCV: 90.9 fL (ref 80.0–100.0)
Monocytes Absolute: 0.7 10*3/uL (ref 0.1–1.0)
Monocytes Relative: 13 %
Neutro Abs: 2.8 10*3/uL (ref 1.7–7.7)
Neutrophils Relative %: 53 %
Platelets: 201 10*3/uL (ref 150–400)
RBC: 2.86 MIL/uL — ABNORMAL LOW (ref 3.87–5.11)
RDW: 15.5 % (ref 11.5–15.5)
WBC: 5.2 10*3/uL (ref 4.0–10.5)
nRBC: 0 % (ref 0.0–0.2)

## 2023-05-30 LAB — GLUCOSE, CAPILLARY
Glucose-Capillary: 161 mg/dL — ABNORMAL HIGH (ref 70–99)
Glucose-Capillary: 117 mg/dL — ABNORMAL HIGH (ref 70–99)
Glucose-Capillary: 133 mg/dL — ABNORMAL HIGH (ref 70–99)
Glucose-Capillary: 117 mg/dL — ABNORMAL HIGH (ref 70–99)

## 2023-05-30 LAB — BASIC METABOLIC PANEL
Anion gap: 11 (ref 5–15)
BUN: 37 mg/dL — ABNORMAL HIGH (ref 8–23)
CO2: 25 mmol/L (ref 22–32)
Calcium: 9 mg/dL (ref 8.9–10.3)
Chloride: 99 mmol/L (ref 98–111)
Creatinine, Ser: 3.27 mg/dL — ABNORMAL HIGH (ref 0.44–1.00)
GFR, Estimated: 15 mL/min — ABNORMAL LOW (ref >60)
Glucose, Bld: 126 mg/dL — ABNORMAL HIGH (ref 70–99)
Potassium: 3.9 mmol/L (ref 3.5–5.1)
Sodium: 135 mmol/L (ref 135–145)

## 2023-05-30 MED ORDER — LACTULOSE 10 GM/15ML PO SOLN
20.0000 g | Freq: Once | ORAL | Status: AC
  Administered 2023-05-30: 20 g via ORAL
  Filled 2023-05-30: qty 30

## 2023-05-30 MED ORDER — CYANOCOBALAMIN 1000 MCG/ML IJ SOLN
1000.0000 ug | Freq: Once | INTRAMUSCULAR | Status: AC
  Administered 2023-05-30: 1000 ug via INTRAMUSCULAR
  Filled 2023-05-30: qty 1

## 2023-05-30 MED ORDER — RENA-VITE PO TABS
1.0000 | ORAL_TABLET | Freq: Every day | ORAL | Status: DC
  Administered 2023-05-30: 1 via ORAL
  Filled 2023-05-30: qty 1

## 2023-05-30 MED ORDER — NEPRO/CARBSTEADY PO LIQD
237.0000 mL | Freq: Three times a day (TID) | ORAL | Status: DC
  Administered 2023-05-30 – 2023-05-31 (×3): 237 mL via ORAL

## 2023-05-30 NOTE — Plan of Care (Addendum)
Nutrition Education Note  RD consulted for renal diet education as pt with new HD.   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   Provided "Low sodium Nutrition Therapy" handout from the Academy of [employer]. Reviewed food groups and provided written recommended serving sizes specifically determined for patient's current nutritional status.   Explained why diet restrictions are needed and provided lists of foods to limit/avoid that are high potassium, sodium and phosphorus. Provided specific recommendations on safer alternatives of these foods. Strongly encouraged compliance of this diet.   Discussed importance of protein intake at each meal and snack. Provided examples of how to maximize protein intake throughout the day. Discussed the possible need for fluid restriction with dialysis, importance of minimizing weight gain between HD treatments and renal-friendly beverage options.  Encouraged pt to take a daily renal multivitamin   Encouraged pt to discuss specific diet questions/concerns with RD at HD outpatient facility. Teach back method used.  Expect good compliance.  Body mass index is 43.4 kg/m. Pt meets criteria for morbid obesity based on current BMI.  Current diet order is renal/carbohydrate modifed, patient is consuming approximately 50-100% of meals at this time.   Labs and medications reviewed.   Nepro Shake po BID, each supplement provides 425 kcal and 19 grams protein  Rena-vit po daily   No further nutrition interventions warranted at this time. RD contact information provided. If additional nutrition issues arise, please re-consult RD.  Betsey Holiday MS, RD, LDN Please refer to [facility] for RD and/or RD on-call/weekend/after hours pager

## 2023-05-30 NOTE — Progress Notes (Signed)
[facility] [place], [place] 05/30/23  Subjective:   Hospital day # 7  Patient seen and evaluated during dialysis   HEMODIALYSIS FLOWSHEET:  Blood Flow Rate (mL/min): 300 mL/min Arterial Pressure (mmHg): -100 mmHg Venous Pressure (mmHg): 110 mmHg TMP (mmHg): 18 mmHg Ultrafiltration Rate (mL/min): 956 mL/min Dialysate Flow Rate (mL/min): 300 ml/min Dialysis Fluid Bolus: Normal Saline  Tolerating treatment well today States she feels better since starting dialysis.   Renal: 05/29 0701 - 05/30 0700 In: 240 [P.O.:240] Out: 500  Lab Results  Component Value Date   CREATININE 3.27 (H) 05/30/2023   CREATININE 3.81 (H) 05/29/2023   CREATININE 4.24 (H) 05/28/2023     Objective:  Vital signs in last 24 hours:  Temp:  [98.1 F (36.7 C)-98.9 F (37.2 C)] 98.4 F (36.9 C) (05/30 0902) Pulse Rate:  [70-82] 82 (05/30 1100) Resp:  [16-31] 20 (05/30 1100) BP: (128-154)/(67-86) 137/73 (05/30 1100) SpO2:  [98 %-100 %] 98 % (05/30 1100) Weight:  [91.9 kg-92.7 kg] 91.9 kg (05/30 0902)  Weight change: -8 kg Filed Weights   05/29/23 1413 05/29/23 1734 05/30/23 0902  Weight: 92.7 kg 92 kg 91.9 kg    Intake/Output:    Intake/Output Summary (Last 24 hours) at 05/30/2023 1123 Last data filed at 05/29/2023 1946 Gross per 24 hour  Intake 120 ml  Output 500 ml  Net -380 ml      Physical Exam: General: No acute distress  HEENT Moist oral mucous membranes  Pulm/lungs Clear on auscultation  CVS/Heart Regular, no rub  Abdomen:  Soft, obese  Extremities: 2+ pitting edema bilaterally  Neurologic: Alert, oriented  Skin: Warm, dry          Basic Metabolic Panel:  Recent Labs  Lab 05/25/23 0408 05/26/23 0410 05/27/23 0415 05/28/23 0441 05/29/23 0442 05/30/23 0442  NA 140 140 138 138 137 135  K 3.7 3.7 3.8 3.8 3.5 3.9  CL 108 108 111 107 104 99  CO2 21* 24 22 19* 23 25  GLUCOSE 177* 164* 165* 137* 150* 126*  BUN 65* 70* 70* 61* 53* 37*   CREATININE 4.02* 4.16* 4.33* 4.24* 3.81* 3.27*  CALCIUM 8.6* 8.6* 8.8* 8.7* 8.5* 9.0  PHOS 4.6  --   --   --   --   --       CBC: Recent Labs  Lab 05/25/23 0408 05/26/23 0410 05/27/23 0415 05/28/23 1604 05/29/23 0442 05/30/23 0442  WBC 7.4 6.1 6.2 3.3* 4.5 5.2  NEUTROABS 4.6 4.2 4.6  --  2.5 2.8  HGB 7.7* 8.0* 8.2* 8.0* 7.2* 8.2*  HCT 24.2* 25.3* 25.8* 25.4* 23.0* 26.0*  MCV 90.3 91.3 90.8 92.4 91.3 90.9  PLT 249 260 242 229 212 201       Lab Results  Component Value Date   HEPBSAG NON REACTIVE 05/27/2023   HEPBSAB Reactive (A) 01/14/2021      Microbiology:  Recent Results (from the past 240 hour(s))  Surgical PCR screen     Status: Abnormal   Collection Time: 05/27/23  9:05 PM   Specimen: Nasal Mucosa; Nasal Swab  Result Value Ref Range Status   MRSA, PCR NEGATIVE NEGATIVE Final   Staphylococcus aureus POSITIVE (A) NEGATIVE Final    Comment: (NOTE) The Xpert SA Assay (FDA approved for NASAL specimens in patients 23 years of age and older), is one component of a comprehensive surveillance program. It is not intended to diagnose infection nor to guide or monitor treatment. Performed at [facility], [phone]  [address] [place], [place] [address]     Coagulation Studies: No results for input(s): "LABPROT", "INR" in the last 72 hours.  Urinalysis: Recent Labs    05/28/23 0921  COLORURINE YELLOW*  LABSPEC 1.014  PHURINE 6.0  GLUCOSEU 50*  HGBUR NEGATIVE  BILIRUBINUR NEGATIVE  KETONESUR 5*  PROTEINUR >=300*  NITRITE NEGATIVE  LEUKOCYTESUR NEGATIVE       Imaging: PERIPHERAL VASCULAR CATHETERIZATION  Result Date: 05/28/2023 See surgical note for result.    Medications:    sodium chloride     anticoagulant sodium citrate      allopurinol  100 mg Oral Daily   aspirin EC  81 mg Oral Daily   carvedilol  12.5 mg Oral BID AC   Chlorhexidine Gluconate Cloth  6 each Topical Daily   cyanocobalamin  1,000 mcg Intramuscular Once    furosemide  80 mg Oral Daily   heparin  5,000 Units Subcutaneous Q8H   hydrALAZINE  50 mg Oral Q8H   insulin aspart  0-9 Units Subcutaneous TID WC   isosorbide mononitrate  30 mg Oral BID   loratadine  10 mg Oral Daily   mupirocin ointment  1 Application Nasal BID   pantoprazole  40 mg Oral Daily   sodium chloride flush  3 mL Intravenous Q12H   sodium chloride, acetaminophen, albuterol, alteplase, anticoagulant sodium citrate, heparin, hydrALAZINE, lidocaine (PF), lidocaine-prilocaine, ondansetron **OR** ondansetron (ZOFRAN) IV, pentafluoroprop-tetrafluoroeth, sodium chloride flush  Assessment/ Plan:  62 y.o. female with medical problems of obesity, asthma, diabetes, chronic kidney disease, hypertension   admitted on 05/23/2023 for Anasarca [R60.1] AKI (acute kidney injury) (HCC) [N17.9] Acute on chronic congestive heart failure, unspecified heart failure type (HCC) [I50.9]  End stage renal disease requiring hemodialysis This appears to be a progression of kidney disease that now requires hemodialysis.  CKD risk factors include diabetes, hypertension, obesity, use of nonsteroidals. Renal ultrasound negative for obstruction Awaiting phosphorus with am labs  Permcath placed on 05/28/23.  Patient receiving dialysis today, UF 2L as tolerated.   Appreciate renal navigator confirming outpatient dialysis clinic at [facility] on a MWF schedule, patient cleared to start tomorrow, but feels she would be unable to secure transportation. Will dialyze patient on Friday.     Hypertension with lower extremity edema Patient has allergy to ACE inhibitor's.  IV furosemide used intermittently.  Blood pressure elevated today. Currently receiving Carvedilol, furosemide, hydralazine, and isosorbide. Blood pressure stable, 131/76 during dialysis.   Type 2 diabetes with CKD Agree with assessing hemoglobin A1c. Not a candidate for SGLT2 inhibitor due to low GFR at this time.  Sliding scale indulin per  primary team  Anemia of chronic kidney disease Iron studies appropriate.  Hgb slowly improving.     LOS: 7 [facility] 5/30/202411:23 AM  [facility] [place], [place] [phone]

## 2023-05-30 NOTE — Progress Notes (Signed)
Patient tolerated 3.25hours HD well with 2 liters fluid removal without complication. Post Hd report given to shift RN  05/30/23 1245  Vitals  Temp 98.7 F (37.1 C)  Temp Source Oral  BP 131/76  MAP (mmHg) 92  BP Location Left Arm  BP Method Automatic  Patient Position (if appropriate) Lying  Pulse Rate 77  Pulse Rate Source Monitor  ECG Heart Rate 78  Resp 20  Oxygen Therapy  SpO2 97 %  O2 Device Room Air  Patient Activity (if Appropriate) In bed  Pulse Oximetry Type Continuous  During Treatment Monitoring  HD Safety Checks Performed Yes  Intra-Hemodialysis Comments Tx completed;Tolerated well  Post Treatment  Dialyzer Clearance Lightly streaked  Duration of HD Treatment -hour(s) 3.25 hour(s)  Hemodialysis Intake (mL) 0 mL  Liters Processed 58.5  Fluid Removed (mL) 2000 mL  Tolerated HD Treatment Yes  Post-Hemodialysis Comments Tx completed. Tolerated well.

## 2023-05-30 NOTE — Discharge Planning (Signed)
PLACEMENT RESOLVED: Outpatient Facility: [facility]  [address] [place], [place] [address] [phone]  Scheduled days: Monday Wednesday and Friday Treatment time: 6:30am Start Date: Monday 6/4 @ 6:00am

## 2023-05-30 NOTE — Progress Notes (Signed)
Progress Note    05/30/2023 7:37 PM 2 Days Post-Op  Subjective: Brianna [name] is a 62 year old female with a medical history significant for obesity, asthma, hypertension, diabetes mellitus 2, stage IV kidney disease presenting with anasarca and acute on chronic stage IV CKD.  Patient presented to [facility] with shortness of breath increasing over a couple of weeks.  He was treated outpatient with Lasix causing renal function to worsen.  Is now postop day 2 from a permacath placement for hemodialysis.  On exam this afternoon patient was resting comfortably in bed.  Her cousin was at the bedside with her.  Permacath is in her right chest.  Patient endorses some soreness but otherwise no other complaints.  Endorses not having any problems with the catheter during hemodialysis.  Patient's vitals all remained stable.   Vitals:   05/30/23 1340 05/30/23 1845  BP: (!) 140/68 109/83  Pulse: 77 78  Resp: 18   Temp: 99 F (37.2 C)   SpO2: 98%    Physical Exam: Cardiac:  RRR, no murmurs rubs or gallops Lungs: Clear on auscultation throughout.  Respiratory effort is normal. Incisions: Chest incision with dressing in place clean dry and intact.  No hematoma seroma or infection to note. Extremities: Full pulses throughout. Abdomen: Bowel sounds in all 4 quadrants, soft, nontender and nondistended. Neurologic: And oriented x 3, follows all commands appropriately, answers all questions appropriately.  CBC    Component Value Date/Time   WBC 5.2 05/30/2023 0442   RBC 2.86 (L) 05/30/2023 0442   HGB 8.2 (L) 05/30/2023 0442   HGB 14.6 04/22/2013 1549   HCT 26.0 (L) 05/30/2023 0442   HCT 44.0 04/22/2013 1549   PLT 201 05/30/2023 0442   PLT 210 04/22/2013 1549   MCV 90.9 05/30/2023 0442   MCV 88 04/22/2013 1549   MCH 28.7 05/30/2023 0442   MCHC 31.5 05/30/2023 0442   RDW 15.5 05/30/2023 0442   RDW 13.8 04/22/2013 1549   LYMPHSABS 1.7 05/30/2023 0442   MONOABS 0.7 05/30/2023 0442   EOSABS  0.0 05/30/2023 0442   BASOSABS 0.0 05/30/2023 0442    BMET    Component Value Date/Time   NA 135 05/30/2023 0442   NA 133 (L) 04/22/2013 1549   K 3.9 05/30/2023 0442   K 3.7 04/22/2013 1549   CL 99 05/30/2023 0442   CL 100 04/22/2013 1549   CO2 25 05/30/2023 0442   CO2 22 04/22/2013 1549   GLUCOSE 126 (H) 05/30/2023 0442   GLUCOSE 386 (H) 04/22/2013 1549   BUN 37 (H) 05/30/2023 0442   BUN 14 04/22/2013 1549   CREATININE 3.27 (H) 05/30/2023 0442   CREATININE 1.08 04/22/2013 1549   CALCIUM 9.0 05/30/2023 0442   CALCIUM 9.4 04/22/2013 1549   GFRNONAA 15 (L) 05/30/2023 0442   GFRNONAA 59 (L) 04/22/2013 1549   GFRAA 26 (L) 02/14/2020 0435   GFRAA >60 04/22/2013 1549    INR No results found for: "INR"   Intake/Output Summary (Last 24 hours) at 05/30/2023 1937 Last data filed at 05/30/2023 1245 Gross per 24 hour  Intake 120 ml  Output 2000 ml  Net -1880 ml     Assessment/Plan:  62 y.o. female is s/p dialysis permacath placement to right chest.  2 Days Post-Op   Patient is recovering as expected.  Dialysis catheter placed to right chest without any complications.  Dialysis catheter being used successfully for hemodialysis. Vascular surgery will sign off this case at this time.  DVT prophylaxis: Heparin  with dialysis.   Marcie Bal [facility] 05/30/2023 7:37 PM

## 2023-05-30 NOTE — Progress Notes (Signed)
  Progress Note   Patient: Brianna [name] ZOX:[id number] DOB: [birth year] DOA: 05/23/2023     7 DOS: the patient was seen and examined on 05/30/2023   Brief hospital course: Ahniya Goggin is a 62 y.o. female with medical history significant of obesity, asthma, hypertension, type 2 diabetes, stage IV CKD presenting with anasarca, acute on chronic stage IV CKD.  Patient reports worsening shortness of breath over multiple weeks.  Patient was initially treated with IV Lasix, renal function getting worse, a permacath was placed on 5/28, hemodialysis was started.   Principal Problem:   Anasarca Active Problems:   Acute renal failure superimposed on stage 4 chronic kidney disease (HCC)   (HFpEF) heart failure with preserved ejection fraction (HCC)   Type 2 diabetes mellitus without complication, with long-term current use of insulin (HCC)   Hypertension   Asthma   Anemia due to stage 4 chronic kidney disease (HCC)   Acute on chronic congestive heart failure (HCC)   End stage renal disease (HCC)   Morbidly obese (HCC)   Acute on chronic diastolic CHF (congestive heart failure) (HCC)   Anemia of chronic kidney failure   Assessment and Plan: Anasarca secondary to volume overload. Acute on chronic diastolic congestive heart failure. Acute renal failure superimposed on stage IV chronic kidney disease. Anasarca has improved after diuretics and hemodialysis.  Volume status much better.  Nephrology, patient had dialysis chair set up, transportation will not be set up until Monday.  Patient can be discharged home tomorrow after dialysis.   Anemia of chronic kidney disease Patient does not have iron deficiency, B12 level is borderline, check homocystine level.  Give a B12 injection, probably will give oral B12 at time of discharge.   Type 2 diabetes. Continue current regimen.   Morbid obesity. BMI 44.18. Diet exercise advised.      Subjective:  Patient doing well today, feel  constipated, has not had a bowel movement.  Lactulose given today.  Physical Exam: Vitals:   05/30/23 1200 05/30/23 1230 05/30/23 1245 05/30/23 1340  BP: 126/68 128/70 131/76 (!) 140/68  Pulse: 85 77 77 77  Resp: (!) 25 18 20 18   Temp:   98.7 F (37.1 C) 99 F (37.2 C)  TempSrc:   Oral Oral  SpO2: 96% 98% 97% 98%  Weight:   91 kg   Height:       General exam: Appears calm and comfortable  Respiratory system: Clear to auscultation. Respiratory effort normal. Cardiovascular system: S1 & S2 heard, RRR. No JVD, murmurs, rubs, gallops or clicks. No pedal edema. Gastrointestinal system: Abdomen is nondistended, soft and nontender. No organomegaly or masses felt. Normal bowel sounds heard. Central nervous system: Alert and oriented. No focal neurological deficits. Extremities: Symmetric 5 x 5 power. Skin: No rashes, lesions or ulcers Psychiatry: Judgement and insight appear normal. Mood & affect appropriate.    Data Reviewed:  Lab results reviewed.  Family Communication: None  Disposition: Status is: Inpatient Remains inpatient appropriate because: Unsafe discharge, will discharge tomorrow.     Time spent: 35 minutes  Author: Marrion Coy, MD 05/30/2023 2:26 PM  For on call review [email or web address].

## 2023-05-31 DIAGNOSIS — R601 Generalized edema: Secondary | ICD-10-CM | POA: Diagnosis not present

## 2023-05-31 DIAGNOSIS — N186 End stage renal disease: Secondary | ICD-10-CM | POA: Diagnosis not present

## 2023-05-31 DIAGNOSIS — I5033 Acute on chronic diastolic (congestive) heart failure: Secondary | ICD-10-CM | POA: Diagnosis not present

## 2023-05-31 DIAGNOSIS — N170 Acute kidney failure with tubular necrosis: Secondary | ICD-10-CM | POA: Diagnosis not present

## 2023-05-31 LAB — CBC WITH DIFFERENTIAL/PLATELET
Abs Immature Granulocytes: 0.02 10*3/uL (ref 0.00–0.07)
Basophils Absolute: 0 10*3/uL (ref 0.0–0.1)
Basophils Relative: 0 %
Eosinophils Absolute: 0.1 10*3/uL (ref 0.0–0.5)
Eosinophils Relative: 1 %
HCT: 25.1 % — ABNORMAL LOW (ref 36.0–46.0)
Hemoglobin: 7.9 g/dL — ABNORMAL LOW (ref 12.0–15.0)
Immature Granulocytes: 0 %
Lymphocytes Relative: 38 %
Lymphs Abs: 2.1 10*3/uL (ref 0.7–4.0)
MCH: 28.7 pg (ref 26.0–34.0)
MCHC: 31.5 g/dL (ref 30.0–36.0)
MCV: 91.3 fL (ref 80.0–100.0)
Monocytes Absolute: 0.6 10*3/uL (ref 0.1–1.0)
Monocytes Relative: 11 %
Neutro Abs: 2.8 10*3/uL (ref 1.7–7.7)
Neutrophils Relative %: 50 %
Platelets: 219 10*3/uL (ref 150–400)
RBC: 2.75 MIL/uL — ABNORMAL LOW (ref 3.87–5.11)
RDW: 15.1 % (ref 11.5–15.5)
WBC: 5.6 10*3/uL (ref 4.0–10.5)
nRBC: 0 % (ref 0.0–0.2)

## 2023-05-31 LAB — MAGNESIUM: Magnesium: 1.6 mg/dL — ABNORMAL LOW (ref 1.7–2.4)

## 2023-05-31 LAB — BASIC METABOLIC PANEL
Anion gap: 11 (ref 5–15)
BUN: 33 mg/dL — ABNORMAL HIGH (ref 8–23)
CO2: 24 mmol/L (ref 22–32)
Calcium: 8.9 mg/dL (ref 8.9–10.3)
Chloride: 97 mmol/L — ABNORMAL LOW (ref 98–111)
Creatinine, Ser: 3.36 mg/dL — ABNORMAL HIGH (ref 0.44–1.00)
GFR, Estimated: 15 mL/min — ABNORMAL LOW (ref >60)
Glucose, Bld: 175 mg/dL — ABNORMAL HIGH (ref 70–99)
Potassium: 3.7 mmol/L (ref 3.5–5.1)
Sodium: 132 mmol/L — ABNORMAL LOW (ref 135–145)

## 2023-05-31 LAB — PHOSPHORUS: Phosphorus: 4.6 mg/dL (ref 2.5–4.6)

## 2023-05-31 LAB — GLUCOSE, CAPILLARY
Glucose-Capillary: 107 mg/dL — ABNORMAL HIGH (ref 70–99)
Glucose-Capillary: 142 mg/dL — ABNORMAL HIGH (ref 70–99)

## 2023-05-31 MED ORDER — MAGNESIUM SULFATE 2 GM/50ML IV SOLN
2.0000 g | Freq: Once | INTRAVENOUS | Status: DC
  Filled 2023-05-31: qty 50

## 2023-05-31 MED ORDER — VITAMIN B-12 1000 MCG PO TABS: 1000.0000 ug | ORAL_TABLET | Freq: Every day | ORAL | 0 refills | Status: DC

## 2023-05-31 MED ORDER — POLYETHYLENE GLYCOL 3350 17 G PO PACK: 17.0000 g | PACK | Freq: Every day | ORAL | 0 refills | Status: DC

## 2023-05-31 MED ORDER — ISOSORBIDE MONONITRATE ER 30 MG PO TB24: 30.0000 mg | ORAL_TABLET | Freq: Two times a day (BID) | ORAL | 0 refills | Status: DC

## 2023-05-31 MED ORDER — CARVEDILOL 12.5 MG PO TABS: 12.5000 mg | ORAL_TABLET | Freq: Two times a day (BID) | ORAL | 0 refills | Status: DC

## 2023-05-31 MED ORDER — HYDRALAZINE HCL 50 MG PO TABS: 50.0000 mg | ORAL_TABLET | Freq: Three times a day (TID) | ORAL | 0 refills | Status: DC

## 2023-05-31 NOTE — Progress Notes (Signed)
New Dialysis Start    Patient identified as new dialysis start. Kidney Education packet assembled and given. Discussed the following items with patient:     Current medications and possible changes once started:  Discussed that patient's medications may change over time.  Ex; hypertension medications and diabetes medication.  Nephrologists will adjust as needed.   Fluid restrictions reviewed:  32 oz daily goal:  All liquids count; soups, ice, jello, fruits. Will also refer dietitian.   Phosphorus and potassium: Handout given showing high potassium and phosphorus foods.  Alternative food and drink options given. Will also refer dietitian.   Family support:  Patient states that she has a close cousin that helps her.   Outpatient Clinic Resources:  Discussed roles of Outpatient clinic staff and advised to make a list of needs, if any, to talk with outpatient staff if needed.   Care plan schedule: Informed patient of Care Plans in outpatient setting and to participate in the care plan.  An invitation would be given from outpatient clinic.    Dialysis Access Options:  Reviewed access options with patients. Discussed in detail about care at home with new AVG & AVF. Reviewed checking bruit and thrill. If dialysis catheter present, educated that patient could not take showers.  Catheter dressing changes were to be done by outpatient clinic staff only.   Home therapy options:  Educated patient about home therapy options:  PD vs home hemo.     Patient verbalized understanding. Will continue to round on patient during admission.  Patient informed me that she has some questions for TOC/Social work. I reached out, they are aware and is scheduled to speak with her today.  Jean Rosenthal Dialysis Nurse Coordinator [phone]

## 2023-05-31 NOTE — Progress Notes (Signed)
Discharge order in, awaiting for patient to come back from HD.

## 2023-05-31 NOTE — Progress Notes (Signed)
Hemodialysis Note  Received patient in bed to unit. Alert and oriented. Informed consent signed and in chart.   Treatment initiated: 0907 Treatment completed: 1219  Patient tolerated treatment well. Transported back to the room alert, without acute distress. Report given to patient's RN.  Access used: RIJ Catheter  Access issues:  None   Total UF removed: 1 liter  Medications given:  None  Post HD VS: Stable during treatment  Post HD weight: 89.2 kg post treatment non standing   Bartolo Darter, RN  [facility]

## 2023-05-31 NOTE — Discharge Summary (Signed)
Physician Discharge Summary   Patient: Brianna [name] MRN: [id number] DOB: 05-06-1961  Admit date:     05/23/2023  Discharge date: 05/31/23  Discharge Physician: Marrion Coy   PCP: Emogene Morgan, MD   Recommendations at discharge:   Follow-up with PCP in 1 week. Follow-up on homocystine level, may continue B12 supplement if elevated.  Discharge Diagnoses: Principal Problem:   Anasarca Active Problems:   Acute renal failure superimposed on stage 4 chronic kidney disease (HCC)   (HFpEF) heart failure with preserved ejection fraction (HCC)   Type 2 diabetes mellitus without complication, with long-term current use of insulin (HCC)   Hypertension   Asthma   Anemia due to stage 4 chronic kidney disease (HCC)   Acute on chronic congestive heart failure (HCC)   End stage renal disease (HCC)   Morbidly obese (HCC)   Acute on chronic diastolic CHF (congestive heart failure) (HCC)   Anemia of chronic kidney failure Hypomagnesemia. Resolved Problems:   * No resolved hospital problems. *  Hospital Course: Brianna [name] is a 62 y.o. female with medical history significant of obesity, asthma, hypertension, type 2 diabetes, stage IV CKD presenting with anasarca, acute on chronic stage IV CKD.  Patient reports worsening shortness of breath over multiple weeks.  Patient was initially treated with IV Lasix, renal function getting worse, a permacath was placed on 5/28, hemodialysis was started. Patient condition has been stabilized, she has became end-stage renal disease.  Dialysis was started, outpatient dialysis chair is available.  Transportation also confirmed on Monday.  Currently she is medically stable to be discharged. Assessment and Plan:  Anasarca secondary to volume overload. Acute on chronic diastolic congestive heart failure. Acute renal failure superimposed on stage IV chronic kidney disease. Anasarca has improved after diuretics and hemodialysis.  Volume status much  better.  Nephrology, patient had dialysis chair set up, transportation will not be set up until Monday.  Patient can be discharged home after dialysis.  Hypomagnesemia. Magnesium level 1.6, will give 2 g of magnesium sulfate before discharge.  Anemia of chronic kidney disease Patient does not have iron deficiency, B12 level is borderline, check homocystine level pending.  Gave a B12 injection, continue oral B12, follow-up on results of homocystine level.    Type 2 diabetes. Glucose not significant elevated, not on scheduled insulin while in the hospital.  Follow-up with PCP as outpatient.   Morbid obesity. BMI 44.18. Diet exercise advised.       Consultants: Nephrology Procedures performed: HD  Disposition: Home Diet recommendation:  Discharge Diet Orders (From admission, onward)     Start     Ordered   05/31/23 0000  Diet general       Comments: Renal diet   05/31/23 1025           Renal diet DISCHARGE MEDICATION: Allergies as of 05/31/2023       Reactions   Ace Inhibitors Anaphylaxis   Lisinopril Anaphylaxis   Lipitor [atorvastatin] Other (See Comments)   Muscle aches        Medication List     STOP taking these medications    amLODipine 10 MG tablet Commonly known as: NORVASC   HumuLIN N 100 UNIT/ML injection Generic drug: insulin NPH Human   hydrochlorothiazide 25 MG tablet Commonly known as: HYDRODIURIL   Klor-Con M20 20 MEQ tablet Generic drug: potassium chloride SA       TAKE these medications    albuterol (2.5 MG/3ML) 0.083% nebulizer solution Commonly known as:  PROVENTIL Take 2.5 mg by nebulization every 4 (four) hours as needed for shortness of breath or wheezing.   allopurinol 300 MG tablet Commonly known as: ZYLOPRIM Take 300 mg by mouth daily.   Aspirin Adult Low Strength 81 MG tablet Generic drug: aspirin EC Take 81 mg by mouth daily.   carvedilol 12.5 MG tablet Commonly known as: COREG Take 1 tablet (12.5 mg total) by  mouth 2 (two) times daily before a meal. What changed:  medication strength how much to take when to take this   cetirizine 10 MG tablet Commonly known as: ZYRTEC Take 10 mg by mouth daily.   cyanocobalamin 1000 MCG tablet Commonly known as: VITAMIN B12 Take 1 tablet (1,000 mcg total) by mouth daily.   FeroSul 325 (65 FE) MG tablet Generic drug: ferrous sulfate Take 325 mg by mouth every other day.   furosemide 80 MG tablet Commonly known as: LASIX Take 1 tablet (80 mg total) by mouth daily. For leg swelling or weight gain >5lbs   hydrALAZINE 50 MG tablet Commonly known as: APRESOLINE Take 1 tablet (50 mg total) by mouth every 8 (eight) hours.   isosorbide mononitrate 30 MG 24 hr tablet Commonly known as: IMDUR Take 1 tablet (30 mg total) by mouth 2 (two) times daily.   Januvia 100 MG tablet Generic drug: sitaGLIPtin Take 100 mg by mouth daily.   omeprazole 20 MG capsule Commonly known as: PRILOSEC Take 20 mg by mouth daily.   polyethylene glycol 17 g packet Commonly known as: MiraLax Take 17 g by mouth daily.   zolpidem 5 MG tablet Commonly known as: AMBIEN Take 1 tablet (5 mg total) by mouth at bedtime as needed for sleep.               Discharge Care Instructions  (From admission, onward)           Start     Ordered   05/31/23 0000  Discharge wound care:       Comments: No wound   05/31/23 1025            Follow-up Information     Aycock, Ngwe A, MD Follow up in 1 week(s).   Specialty: Family Medicine Contact information: [address] [place] [address] [phone]                Discharge Exam: Filed Weights   05/30/23 1245 05/31/23 0412 05/31/23 0901  Weight: 91 kg 95.1 kg 91.4 kg   General exam: Appears calm and comfortable, morbidly obese. Respiratory system: Clear to auscultation. Respiratory effort normal. Cardiovascular system: S1 & S2 heard, RRR. No JVD, murmurs, rubs, gallops or clicks. No pedal  edema. Gastrointestinal system: Abdomen is nondistended, soft and nontender. No organomegaly or masses felt. Normal bowel sounds heard. Central nervous system: Alert and oriented. No focal neurological deficits. Extremities: Symmetric 5 x 5 power. Skin: No rashes, lesions or ulcers Psychiatry: Judgement and insight appear normal. Mood & affect appropriate.    Condition at discharge: good  The results of significant diagnostics from this hospitalization (including imaging, microbiology, ancillary and laboratory) are listed below for reference.   Imaging Studies: PERIPHERAL VASCULAR CATHETERIZATION  Result Date: 05/28/2023 See surgical note for result.  DG Chest Port 1 View  Result Date: 05/28/2023 CLINICAL DATA:  Shortness of breath EXAM: PORTABLE CHEST 1 VIEW COMPARISON:  05/23/2023 FINDINGS: Cardiomegaly as seen previously. Aortic atherosclerosis. The right lung is clear. Mild linear atelectasis or scar at the left  lower lobe. No evidence of edema or effusion. IMPRESSION: Cardiomegaly. Aortic atherosclerosis. Mild linear atelectasis or scar at the left lower lobe. Electronically Signed   By: Paulina Fusi M.D.   On: 05/28/2023 09:33   ECHOCARDIOGRAM COMPLETE  Result Date: 05/23/2023    ECHOCARDIOGRAM REPORT   Patient Name:   Brianna [name] Date of Exam: 05/23/2023 Medical Rec #:  [id number]           Height:       57.0 in Accession #:    [id number]          Weight:       222.0 lb Date of Birth:  12/09/1961           BSA:          1.880 m Patient Age:    61 years            BP:           152/117 mmHg Patient Gender: F                   HR:           82 bpm. Exam Location:  [facility] Procedure: 2D Echo, Color Doppler and Cardiac Doppler Indications:     CHF-acute diastolic I50.31  History:         Patient has prior history of Echocardiogram examinations, most                  recent 01/12/2021.  Sonographer:     Cristela Blue Referring Phys:  [id number] [place] Diagnosing Phys: Julien Nordmann MD  IMPRESSIONS  1. Left ventricular ejection fraction, by estimation, is 60 to 65%. The left ventricle has normal function. The left ventricle has no regional wall motion abnormalities. Left ventricular diastolic parameters are consistent with Grade I diastolic dysfunction (impaired relaxation).  2. Right ventricular systolic function is normal. The right ventricular size is normal. There is normal pulmonary artery systolic pressure. The estimated right ventricular systolic pressure is 20.5 mmHg.  3. The mitral valve is normal in structure. No evidence of mitral valve regurgitation. No evidence of mitral stenosis.  4. The aortic valve has an indeterminant number of cusps. There is mild calcification of the aortic valve. Aortic valve regurgitation is not visualized. Aortic valve sclerosis is present, with no evidence of aortic valve stenosis.  5. The inferior vena cava is normal in size with greater than 50% respiratory variability, suggesting right atrial pressure of 3 mmHg. FINDINGS  Left Ventricle: Left ventricular ejection fraction, by estimation, is 60 to 65%. The left ventricle has normal function. The left ventricle has no regional wall motion abnormalities. The left ventricular internal cavity size was normal in size. There is  no left ventricular hypertrophy. Left ventricular diastolic parameters are consistent with Grade I diastolic dysfunction (impaired relaxation). Right Ventricle: The right ventricular size is normal. No increase in right ventricular wall thickness. Right ventricular systolic function is normal. There is normal pulmonary artery systolic pressure. The tricuspid regurgitant velocity is 1.97 m/s, and  with an assumed right atrial pressure of 5 mmHg, the estimated right ventricular systolic pressure is 20.5 mmHg. Left Atrium: Left atrial size was normal in size. Right Atrium: Right atrial size was normal in size. Pericardium: There is no evidence of pericardial effusion. Mitral Valve: The  mitral valve is normal in structure. Mild mitral annular calcification. No evidence of mitral valve regurgitation. No evidence of mitral valve stenosis. MV peak gradient,  5.3 mmHg. The mean mitral valve gradient is 3.0 mmHg. Tricuspid Valve: The tricuspid valve is normal in structure. Tricuspid valve regurgitation is not demonstrated. No evidence of tricuspid stenosis. Aortic Valve: The aortic valve has an indeterminant number of cusps. There is mild calcification of the aortic valve. Aortic valve regurgitation is not visualized. Aortic valve sclerosis is present, with no evidence of aortic valve stenosis. Aortic valve  mean gradient measures 6.0 mmHg. Aortic valve peak gradient measures 11.0 mmHg. Aortic valve area, by VTI measures 2.55 cm. Pulmonic Valve: The pulmonic valve was normal in structure. Pulmonic valve regurgitation is not visualized. No evidence of pulmonic stenosis. Aorta: The aortic root is normal in size and structure. Venous: The inferior vena cava is normal in size with greater than 50% respiratory variability, suggesting right atrial pressure of 3 mmHg. IAS/Shunts: No atrial level shunt detected by color flow Doppler.  LEFT VENTRICLE PLAX 2D LVIDd:         4.70 cm   Diastology LVIDs:         2.80 cm   LV e' medial:    5.66 cm/s LV PW:         1.30 cm   LV E/e' medial:  16.4 LV IVS:        1.20 cm   LV e' lateral:   9.46 cm/s LVOT diam:     2.00 cm   LV E/e' lateral: 9.8 LV SV:         77 LV SV Index:   41 LVOT Area:     3.14 cm  RIGHT VENTRICLE RV Basal diam:  4.30 cm RV Mid diam:    3.40 cm RV S prime:     14.70 cm/s TAPSE (M-mode): 3.3 cm LEFT ATRIUM             Index        RIGHT ATRIUM           Index LA diam:        4.10 cm 2.18 cm/m   RA Area:     13.50 cm LA Vol (A2C):   45.0 ml 23.93 ml/m  RA Volume:   32.80 ml  17.44 ml/m LA Vol (A4C):   32.3 ml 17.18 ml/m LA Biplane Vol: 37.7 ml 20.05 ml/m  AORTIC VALVE AV Area (Vmax):    1.88 cm AV Area (Vmean):   2.14 cm AV Area (VTI):      2.55 cm AV Vmax:           165.50 cm/s AV Vmean:          114.500 cm/s AV VTI:            0.302 m AV Peak Grad:      11.0 mmHg AV Mean Grad:      6.0 mmHg LVOT Vmax:         99.00 cm/s LVOT Vmean:        78.000 cm/s LVOT VTI:          0.246 m LVOT/AV VTI ratio: 0.81  AORTA Ao Root diam: 3.20 cm MITRAL VALVE                TRICUSPID VALVE MV Area (PHT): 2.54 cm     TR Peak grad:   15.5 mmHg MV Area VTI:   2.59 cm     TR Vmax:        197.00 cm/s MV Peak grad:  5.3 mmHg MV Mean grad:  3.0 mmHg  SHUNTS MV Vmax:       1.15 m/s     Systemic VTI:  0.25 m MV Vmean:      78.5 cm/s    Systemic Diam: 2.00 cm MV Decel Time: 299 msec MV E velocity: 92.80 cm/s MV A velocity: 106.00 cm/s MV E/A ratio:  0.88 Julien Nordmann MD Electronically signed by Julien Nordmann MD Signature Date/Time: 05/23/2023/5:34:46 PM    Final    US RENAL  Result Date: 05/23/2023 CLINICAL DATA:  Acute kidney injury. EXAM: RENAL / URINARY TRACT ULTRASOUND COMPLETE COMPARISON:  01/13/2021 FINDINGS: Right Kidney: Renal measurements: 9.6 x 6.2 x 6.3 cm = volume: 196.1 mL. Increased parenchymal echogenicity. No mass or hydronephrosis. Left Kidney: Renal measurements: 10.0 x 5.8 x 5.3 cm = volume: 161.4 mL. Increased parenchymal echogenicity. No mass or hydronephrosis. Bladder: Appears normal for degree of bladder distention. Other: None. IMPRESSION: 1. No acute findings. No hydronephrosis. 2. Increased renal parenchymal echogenicity compatible with chronic medical renal disease. Electronically Signed   By: Signa Kell M.D.   On: 05/23/2023 16:16   DG Chest 2 View  Result Date: 05/23/2023 CLINICAL DATA:  Shortness of breath. EXAM: CHEST - 2 VIEW COMPARISON:  01/12/2021 FINDINGS: The lungs are clear without focal pneumonia, edema, pneumothorax or pleural effusion. Linear densities of both lung bases are compatible with chronic atelectasis or scarring. The cardio pericardial silhouette is enlarged. No acute bony abnormality. IMPRESSION: Chronic  bibasilar atelectasis or scarring. No acute cardiopulmonary findings. Electronically Signed   By: Kennith Center M.D.   On: 05/23/2023 07:47    Microbiology: Results for orders placed or performed during the hospital encounter of 05/23/23  Surgical PCR screen     Status: Abnormal   Collection Time: 05/27/23  9:05 PM   Specimen: Nasal Mucosa; Nasal Swab  Result Value Ref Range Status   MRSA, PCR NEGATIVE NEGATIVE Final   Staphylococcus aureus POSITIVE (A) NEGATIVE Final    Comment: (NOTE) The Xpert SA Assay (FDA approved for NASAL specimens in patients 78 years of age and older), is one component of a comprehensive surveillance program. It is not intended to diagnose infection nor to guide or monitor treatment. Performed at [facility], [address] [place], [place] [address]     Labs: CBC: Recent Labs  Lab 05/26/23 0410 05/27/23 0415 05/28/23 1604 05/29/23 0442 05/30/23 0442 05/31/23 0424  WBC 6.1 6.2 3.3* 4.5 5.2 5.6  NEUTROABS 4.2 4.6  --  2.5 2.8 2.8  HGB 8.0* 8.2* 8.0* 7.2* 8.2* 7.9*  HCT 25.3* 25.8* 25.4* 23.0* 26.0* 25.1*  MCV 91.3 90.8 92.4 91.3 90.9 91.3  PLT 260 242 229 212 201 219   Basic Metabolic Panel: Recent Labs  Lab 05/25/23 0408 05/26/23 0410 05/27/23 0415 05/28/23 0441 05/29/23 0442 05/30/23 0442 05/31/23 0424  NA 140   < > 138 138 137 135 132*  K 3.7   < > 3.8 3.8 3.5 3.9 3.7  CL 108   < > 111 107 104 99 97*  CO2 21*   < > 22 19* 23 25 24   GLUCOSE 177*   < > 165* 137* 150* 126* 175*  BUN 65*   < > 70* 61* 53* 37* 33*  CREATININE 4.02*   < > 4.33* 4.24* 3.81* 3.27* 3.36*  CALCIUM 8.6*   < > 8.8* 8.7* 8.5* 9.0 8.9  MG  --   --   --   --   --   --  1.6*  PHOS 4.6  --   --   --   --   --  4.6   < > = values in this interval not displayed.   Liver Function Tests: No results for input(s): "AST", "ALT", "ALKPHOS", "BILITOT", "PROT", "ALBUMIN" in the last 168 hours. CBG: Recent Labs  Lab 05/30/23 0819 05/30/23 1408  05/30/23 1837 05/30/23 2117 05/31/23 0811  GLUCAP 161* 117* 133* 117* 107*    Discharge time spent: greater than 30 minutes.  Signed: Marrion Coy, MD [facility] 05/31/2023

## 2023-05-31 NOTE — Progress Notes (Signed)
[facility], [place] 05/31/23  Subjective:   Hospital day # 8  Patient seen and evaluated during dialysis   HEMODIALYSIS FLOWSHEET:  Blood Flow Rate (mL/min): 300 mL/min Arterial Pressure (mmHg): -90 mmHg Venous Pressure (mmHg): 120 mmHg TMP (mmHg): 18 mmHg Ultrafiltration Rate (mL/min): 531 mL/min Dialysate Flow Rate (mL/min): 300 ml/min Dialysis Fluid Bolus: Normal Saline  Tolerating treatment well Appears to be frustrated with transportation concerns  Renal: 05/30 0701 - 05/31 0700 In: -  Out: 2000  Lab Results  Component Value Date   CREATININE 3.36 (H) 05/31/2023   CREATININE 3.27 (H) 05/30/2023   CREATININE 3.81 (H) 05/29/2023     Objective:  Vital signs in last 24 hours:  Temp:  [97.7 F (36.5 C)-99 F (37.2 C)] 98.8 F (37.1 C) (05/31 1211) Pulse Rate:  [69-83] 76 (05/31 1211) Resp:  [14-27] 14 (05/31 1211) BP: (106-163)/(65-111) 144/79 (05/31 1211) SpO2:  [98 %-100 %] 99 % (05/31 1211) Weight:  [91.4 kg-95.1 kg] 91.4 kg (05/31 0901)  Weight change: -0.8 kg Filed Weights   05/30/23 1245 05/31/23 0412 05/31/23 0901  Weight: 91 kg 95.1 kg 91.4 kg    Intake/Output:    Intake/Output Summary (Last 24 hours) at 05/31/2023 1325 Last data filed at 05/31/2023 1211 Gross per 24 hour  Intake --  Output 1000 ml  Net -1000 ml      Physical Exam: General: No acute distress  HEENT Moist oral mucous membranes  Pulm/lungs Clear on auscultation  CVS/Heart Regular, no rub  Abdomen:  Soft, obese  Extremities: 1+ pitting edema bilaterally  Neurologic: Alert, oriented  Skin: Warm, dry          Basic Metabolic Panel:  Recent Labs  Lab 05/25/23 0408 05/26/23 0410 05/27/23 0415 05/28/23 0441 05/29/23 0442 05/30/23 0442 05/31/23 0424  NA 140   < > 138 138 137 135 132*  K 3.7   < > 3.8 3.8 3.5 3.9 3.7  CL 108   < > 111 107 104 99 97*  CO2 21*   < > 22 19* 23 25 24   GLUCOSE 177*   < > 165* 137* 150* 126* 175*  BUN 65*    < > 70* 61* 53* 37* 33*  CREATININE 4.02*   < > 4.33* 4.24* 3.81* 3.27* 3.36*  CALCIUM 8.6*   < > 8.8* 8.7* 8.5* 9.0 8.9  MG  --   --   --   --   --   --  1.6*  PHOS 4.6  --   --   --   --   --  4.6   < > = values in this interval not displayed.      CBC: Recent Labs  Lab 05/26/23 0410 05/27/23 0415 05/28/23 1604 05/29/23 0442 05/30/23 0442 05/31/23 0424  WBC 6.1 6.2 3.3* 4.5 5.2 5.6  NEUTROABS 4.2 4.6  --  2.5 2.8 2.8  HGB 8.0* 8.2* 8.0* 7.2* 8.2* 7.9*  HCT 25.3* 25.8* 25.4* 23.0* 26.0* 25.1*  MCV 91.3 90.8 92.4 91.3 90.9 91.3  PLT 260 242 229 212 201 219       Lab Results  Component Value Date   HEPBSAG NON REACTIVE 05/27/2023   HEPBSAB Reactive (A) 01/14/2021      Microbiology:  Recent Results (from the past 240 hour(s))  Surgical PCR screen     Status: Abnormal   Collection Time: 05/27/23  9:05 PM   Specimen: Nasal Mucosa; Nasal Swab  Result Value Ref Range Status  MRSA, PCR NEGATIVE NEGATIVE Final   Staphylococcus aureus POSITIVE (A) NEGATIVE Final    Comment: (NOTE) The Xpert SA Assay (FDA approved for NASAL specimens in patients 68 years of age and older), is one component of a comprehensive surveillance program. It is not intended to diagnose infection nor to guide or monitor treatment. Performed at [facility], [address] [place], [place] [address]     Coagulation Studies: No results for input(s): "LABPROT", "INR" in the last 72 hours.  Urinalysis: No results for input(s): "COLORURINE", "LABSPEC", "PHURINE", "GLUCOSEU", "HGBUR", "BILIRUBINUR", "KETONESUR", "PROTEINUR", "UROBILINOGEN", "NITRITE", "LEUKOCYTESUR" in the last 72 hours.  Invalid input(s): "APPERANCEUR"     Imaging: No results found.   Medications:    sodium chloride     anticoagulant sodium citrate     magnesium sulfate bolus IVPB      allopurinol  100 mg Oral Daily   aspirin EC  81 mg Oral Daily   carvedilol  12.5 mg Oral BID AC   Chlorhexidine  Gluconate Cloth  6 each Topical Daily   feeding supplement (NEPRO CARB STEADY)  237 mL Oral TID BM   furosemide  80 mg Oral Daily   heparin  5,000 Units Subcutaneous Q8H   hydrALAZINE  50 mg Oral Q8H   insulin aspart  0-9 Units Subcutaneous TID WC   isosorbide mononitrate  30 mg Oral BID   loratadine  10 mg Oral Daily   multivitamin  1 tablet Oral QHS   mupirocin ointment  1 Application Nasal BID   pantoprazole  40 mg Oral Daily   sodium chloride flush  3 mL Intravenous Q12H   sodium chloride, acetaminophen, albuterol, alteplase, anticoagulant sodium citrate, heparin, hydrALAZINE, lidocaine (PF), lidocaine-prilocaine, ondansetron **OR** ondansetron (ZOFRAN) IV, pentafluoroprop-tetrafluoroeth, sodium chloride flush  Assessment/ Plan:  62 y.o. female with medical problems of obesity, asthma, diabetes, chronic kidney disease, hypertension   admitted on 05/23/2023 for Anasarca [R60.1] AKI (acute kidney injury) (HCC) [N17.9] Acute on chronic congestive heart failure, unspecified heart failure type (HCC) [I50.9]  End stage renal disease requiring hemodialysis This appears to be a progression of kidney disease that now requires hemodialysis.  CKD risk factors include diabetes, hypertension, obesity, use of nonsteroidals. Renal ultrasound negative for obstruction Awaiting phosphorus with am labs  Permcath placed on 05/28/23.  Patient received dialysis treatment today, UF 1 L as tolerated.  Patient also received third dialysis treatment yesterday with 2 L fluid removal.  Appreciate renal navigator confirming outpatient dialysis clinic at [facility] on a MWF schedule.  Patient cleared to start outpatient treatments on Monday. - Patient cleared to discharge from renal stance.  Outpatient clinic will continue to follow-up with transportation needs.    Hypertension with lower extremity edema Patient has allergy to ACE inhibitor's.  IV furosemide used intermittently.  Blood pressure elevated  today. Currently receiving Carvedilol, furosemide, hydralazine, and isosorbide. Blood pressure 137/76 during dialysis   Type 2 diabetes with CKD Agree with assessing hemoglobin A1c. Not a candidate for SGLT2 inhibitor due to low GFR at this time.  Glucose well-controlled.  Primary team managing sliding scale insulin.  Anemia of chronic kidney disease Iron studies appropriate.  Hemoglobin 7.9.    LOS: 8 Brianna [name] 5/31/20241:25 PM  [address] [place], [place] [phone]

## 2023-05-31 NOTE — Plan of Care (Signed)

## 2023-06-03 LAB — HOMOCYSTEINE

## 2023-06-05 LAB — VITAMIN D 25 HYDROXY (VIT D DEFICIENCY, FRACTURES)

## 2023-06-06 ENCOUNTER — Encounter: Payer: Self-pay | Admitting: Dietician

## 2023-06-06 NOTE — Progress Notes (Signed)
Nutrition Brief Note  Pt had vitamin D level sent out while hospitalized (5/31). Results are as follows:  Spoke with patient via phone. Recommended supplementation of 5000 units po daily and to follow up with Nephrologist or PCP to see if higher dosing is warranted. Pt reports that she has HD tomorrow and will speak with Nephrologist.   Betsey Holiday MS, RD, LDN Please refer to [facility] for RD and/or RD on-call/weekend/after hours pager

## 2023-07-03 ENCOUNTER — Other Ambulatory Visit (INDEPENDENT_AMBULATORY_CARE_PROVIDER_SITE_OTHER): Payer: Self-pay | Admitting: Nurse Practitioner

## 2023-07-03 DIAGNOSIS — N186 End stage renal disease: Secondary | ICD-10-CM

## 2023-07-11 ENCOUNTER — Ambulatory Visit (INDEPENDENT_AMBULATORY_CARE_PROVIDER_SITE_OTHER): Payer: BC Managed Care – PPO

## 2023-07-11 ENCOUNTER — Encounter (INDEPENDENT_AMBULATORY_CARE_PROVIDER_SITE_OTHER): Payer: Self-pay | Admitting: Nurse Practitioner

## 2023-07-11 ENCOUNTER — Ambulatory Visit (INDEPENDENT_AMBULATORY_CARE_PROVIDER_SITE_OTHER): Payer: BC Managed Care – PPO | Admitting: Nurse Practitioner

## 2023-07-11 VITALS — BP 138/71 | HR 76 | Resp 20 | Ht <= 58 in | Wt 195.2 lb

## 2023-07-11 DIAGNOSIS — Z794 Long term (current) use of insulin: Secondary | ICD-10-CM

## 2023-07-11 DIAGNOSIS — N186 End stage renal disease: Secondary | ICD-10-CM

## 2023-07-11 DIAGNOSIS — Z992 Dependence on renal dialysis: Secondary | ICD-10-CM

## 2023-07-11 DIAGNOSIS — E1129 Type 2 diabetes mellitus with other diabetic kidney complication: Secondary | ICD-10-CM | POA: Diagnosis not present

## 2023-07-11 DIAGNOSIS — I10 Essential (primary) hypertension: Secondary | ICD-10-CM | POA: Diagnosis not present

## 2023-07-11 NOTE — H&P (View-Only) (Signed)
Subjective:    Patient ID: Brianna [name], female    DOB: [birth year], 62 y.o.   MRN: [id number] Chief Complaint  Patient presents with   New Patient (Initial Visit)    np. ue art dup + ue v-map + consult. referred by lateef, munsoor    The patient is seen for evaluation for dialysis access.  She had a PermCath placed on 05/28/2023.  Her PermCath is working without issue.   The patient is followed by nephrology.    The patient is right-handed.  The patient has been considering the various methods of dialysis and wishes to proceed with hemodialysis and therefore creation of AV access.  No recent shortening of the patient's walking distance or new symptoms consistent with claudication.  No history of rest pain symptoms. No new ulcers or wounds of the lower extremities have occurred.  The patient denies amaurosis fugax or recent TIA symptoms. There are no recent neurological changes noted. There is no history of DVT, PE or superficial thrombophlebitis. No recent episodes of angina or shortness of breath documented.    Today the patient has adequate vein diameters for creation of a left radiocephalic AV fistula    Review of Systems  Musculoskeletal:  Positive for arthralgias and gait problem.  All other systems reviewed and are negative.      Objective:   Physical Exam Vitals reviewed.  HENT:     Head: Normocephalic.  Cardiovascular:     Rate and Rhythm: Normal rate.     Pulses:          Radial pulses are 2+ on the right side and 2+ on the left side.  Pulmonary:     Effort: Pulmonary effort is normal.  Skin:    General: Skin is warm and dry.  Neurological:     Mental Status: She is alert and oriented to person, place, and time.  Psychiatric:        Mood and Affect: Mood normal.        Behavior: Behavior normal.        Thought Content: Thought content normal.        Judgment: Judgment normal.     BP 138/71 (BP Location: Left Wrist)   Pulse 76   Resp 20    Ht 4\' 9"  (1.448 m)   Wt 195 lb 3.2 oz (88.5 kg)   BMI 42.24 kg/m   Past Medical History:  Diagnosis Date   Acid reflux    Anasarca    Asthma    Chronic heart failure with preserved ejection fraction (HFpEF) (HCC)    a. 12/2020 Echo: EF 70-75%, no rwma, mild LVH, GrI DD, nl RV fxn, triv MR/AI.   CKD (chronic kidney disease), stage IV (HCC)    Diabetes mellitus without complication (HCC)    Hypertension    Morbid obesity (HCC)     Social History   Socioeconomic History   Marital status: Single    Spouse name: Not on file   Number of children: Not on file   Years of education: Not on file   Highest education level: Not on file  Occupational History   Not on file  Tobacco Use   Smoking status: Never   Smokeless tobacco: Never  Substance and Sexual Activity   Alcohol use: Never   Drug use: Never   Sexual activity: Not Currently  Other Topics Concern   Not on file  Social History Narrative   Not on file   Social Determinants  of Health   Financial Resource Strain: Not on file  Food Insecurity: No Food Insecurity (05/23/2023)   Hunger Vital Sign    Worried About Running Out of Food in the Last Year: Never true    Ran Out of Food in the Last Year: Never true  Transportation Needs: No Transportation Needs (05/23/2023)   PRAPARE - Administrator, Civil Service (Medical): No    Lack of Transportation (Non-Medical): No  Physical Activity: Not on file  Stress: Not on file  Social Connections: Not on file  Intimate Partner Violence: Not At Risk (05/23/2023)   Humiliation, Afraid, Rape, and Kick questionnaire    Fear of Current or Ex-Partner: No    Emotionally Abused: No    Physically Abused: No    Sexually Abused: No    Past Surgical History:  Procedure Laterality Date   BOWEL RESECTION  02/09/2020   Procedure: SMALL BOWEL RESECTION;  Surgeon: Henrene Dodge, MD;  Location: [facility] OR[facility];  Service: General;;   DIALYSIS/PERMA CATHETER INSERTION N/A 05/28/2023    Procedure: DIALYSIS/PERMA CATHETER INSERTION;  Surgeon: Renford Dills, MD;  Location: [facility];  Service: Cardiovascular;  Laterality: N/A;   LAPAROTOMY N/A 02/09/2020   Procedure: EXPLORATORY LAPAROTOMY;  Surgeon: Henrene Dodge, MD;  Location: [facility];  Service: General;  Laterality: N/A;    Family History  Problem Relation Age of Onset   Breast cancer Neg Hx     Allergies  Allergen Reactions   Ace Inhibitors Anaphylaxis   Lisinopril Anaphylaxis   Lipitor [Atorvastatin] Other (See Comments)    Muscle aches       Latest Ref Rng & Units 05/31/2023    4:24 AM 05/30/2023    4:42 AM 05/29/2023    4:42 AM  CBC  WBC 4.0 - 10.5 K/uL 5.6  5.2  4.5   Hemoglobin 12.0 - 15.0 g/dL 7.9  8.2  7.2   Hematocrit 36.0 - 46.0 % 25.1  26.0  23.0   Platelets 150 - 400 K/uL 219  201  212       CMP     Component Value Date/Time   NA 132 (L) 05/31/2023 0424   NA 133 (L) 04/22/2013 1549   K 3.7 05/31/2023 0424   K 3.7 04/22/2013 1549   CL 97 (L) 05/31/2023 0424   CL 100 04/22/2013 1549   CO2 24 05/31/2023 0424   CO2 22 04/22/2013 1549   GLUCOSE 175 (H) 05/31/2023 0424   GLUCOSE 386 (H) 04/22/2013 1549   BUN 33 (H) 05/31/2023 0424   BUN 14 04/22/2013 1549   CREATININE 3.36 (H) 05/31/2023 0424   CREATININE 1.08 04/22/2013 1549   CALCIUM 8.9 05/31/2023 0424   CALCIUM 9.4 04/22/2013 1549   PROT 5.9 (L) 05/24/2023 0430   ALBUMIN 2.7 (L) 05/24/2023 0430   AST 14 (L) 05/24/2023 0430   ALT 13 05/24/2023 0430   ALKPHOS 145 (H) 05/24/2023 0430   BILITOT 0.4 05/24/2023 0430   GFRNONAA 15 (L) 05/31/2023 0424   GFRNONAA 59 (L) 04/22/2013 1549     No results found.     Assessment & Plan:   1. ESRD on dialysis [facility]) Recommend:  At this time the patient does not have appropriate extremity access for dialysis  Patient should have a left radiocephalic AV fistula created.  The risks, benefits and alternative therapies were reviewed in detail with the patient.  All questions  were answered.  The patient agrees to proceed with surgery.  The patient will follow up with me in the office after the surgery.  2. Primary hypertension Continue antihypertensive medications as already ordered, these medications have been reviewed and there are no changes at this time.  3. Type 2 diabetes mellitus with other diabetic kidney complication, with long-term current use of insulin (HCC) Continue hypoglycemic medications as already ordered, these medications have been reviewed and there are no changes at this time.  Hgb A1C to be monitored as already arranged by primary service   Current Outpatient Medications on File Prior to Visit  Medication Sig Dispense Refill   albuterol (PROVENTIL) (2.5 MG/3ML) 0.083% nebulizer solution Take 2.5 mg by nebulization every 4 (four) hours as needed for shortness of breath or wheezing.     allopurinol (ZYLOPRIM) 300 MG tablet Take 300 mg by mouth daily.     ASPIRIN ADULT LOW STRENGTH 81 MG EC tablet Take 81 mg by mouth daily.     carvedilol (COREG) 12.5 MG tablet Take 1 tablet (12.5 mg total) by mouth 2 (two) times daily before a meal. 60 tablet 0   cetirizine (ZYRTEC) 10 MG tablet Take 10 mg by mouth daily.     cyanocobalamin (VITAMIN B12) 1000 MCG tablet Take 1 tablet (1,000 mcg total) by mouth daily. 15 tablet 0   FEROSUL 325 (65 Fe) MG tablet Take 325 mg by mouth every other day.     furosemide (LASIX) 80 MG tablet Take 1 tablet (80 mg total) by mouth daily. For leg swelling or weight gain >5lbs 30 tablet 2   HUMULIN N KWIKPEN 100 UNIT/ML KwikPen Inject into the skin.     hydrALAZINE (APRESOLINE) 50 MG tablet Take 1 tablet (50 mg total) by mouth every 8 (eight) hours. 90 tablet 0   isosorbide mononitrate (IMDUR) 30 MG 24 hr tablet Take 1 tablet (30 mg total) by mouth 2 (two) times daily. 60 tablet 0   JANUVIA 100 MG tablet Take 100 mg by mouth daily.     omeprazole (PRILOSEC) 20 MG capsule Take 20 mg by mouth daily.     polyethylene  glycol (MIRALAX) 17 g packet Take 17 g by mouth daily. 30 each 0   triamcinolone ointment (KENALOG) 0.5 % Apply 1 Application topically 2 (two) times daily.     ZETIA 10 MG tablet Take 10 mg by mouth daily.     zolpidem (AMBIEN) 5 MG tablet Take 1 tablet (5 mg total) by mouth at bedtime as needed for sleep. 5 tablet 0   No current facility-administered medications on file prior to visit.    There are no Patient Instructions on file for this visit. No follow-ups on file.   Georgiana Spinner, NP

## 2023-07-11 NOTE — Progress Notes (Signed)
Subjective:    Patient ID: Brianna [name], female    DOB: [birth year], 62 y.o.   MRN: [id number] Chief Complaint  Patient presents with   New Patient (Initial Visit)    np. ue art dup + ue v-map + consult. referred by lateef, munsoor    The patient is seen for evaluation for dialysis access.  She had a PermCath placed on 05/28/2023.  Her PermCath is working without issue.   The patient is followed by nephrology.    The patient is right-handed.  The patient has been considering the various methods of dialysis and wishes to proceed with hemodialysis and therefore creation of AV access.  No recent shortening of the patient's walking distance or new symptoms consistent with claudication.  No history of rest pain symptoms. No new ulcers or wounds of the lower extremities have occurred.  The patient denies amaurosis fugax or recent TIA symptoms. There are no recent neurological changes noted. There is no history of DVT, PE or superficial thrombophlebitis. No recent episodes of angina or shortness of breath documented.    Today the patient has adequate vein diameters for creation of a left radiocephalic AV fistula    Review of Systems  Musculoskeletal:  Positive for arthralgias and gait problem.  All other systems reviewed and are negative.      Objective:   Physical Exam Vitals reviewed.  HENT:     Head: Normocephalic.  Cardiovascular:     Rate and Rhythm: Normal rate.     Pulses:          Radial pulses are 2+ on the right side and 2+ on the left side.  Pulmonary:     Effort: Pulmonary effort is normal.  Skin:    General: Skin is warm and dry.  Neurological:     Mental Status: She is alert and oriented to person, place, and time.  Psychiatric:        Mood and Affect: Mood normal.        Behavior: Behavior normal.        Thought Content: Thought content normal.        Judgment: Judgment normal.     BP 138/71 (BP Location: Left Wrist)   Pulse 76   Resp 20    Ht 4\' 9"  (1.448 m)   Wt 195 lb 3.2 oz (88.5 kg)   BMI 42.24 kg/m   Past Medical History:  Diagnosis Date   Acid reflux    Anasarca    Asthma    Chronic heart failure with preserved ejection fraction (HFpEF) (HCC)    a. 12/2020 Echo: EF 70-75%, no rwma, mild LVH, GrI DD, nl RV fxn, triv MR/AI.   CKD (chronic kidney disease), stage IV (HCC)    Diabetes mellitus without complication (HCC)    Hypertension    Morbid obesity (HCC)     Social History   Socioeconomic History   Marital status: Single    Spouse name: Not on file   Number of children: Not on file   Years of education: Not on file   Highest education level: Not on file  Occupational History   Not on file  Tobacco Use   Smoking status: Never   Smokeless tobacco: Never  Substance and Sexual Activity   Alcohol use: Never   Drug use: Never   Sexual activity: Not Currently  Other Topics Concern   Not on file  Social History Narrative   Not on file   Social Determinants  of Health   Financial Resource Strain: Not on file  Food Insecurity: No Food Insecurity (05/23/2023)   Hunger Vital Sign    Worried About Running Out of Food in the Last Year: Never true    Ran Out of Food in the Last Year: Never true  Transportation Needs: No Transportation Needs (05/23/2023)   PRAPARE - Administrator, Civil Service (Medical): No    Lack of Transportation (Non-Medical): No  Physical Activity: Not on file  Stress: Not on file  Social Connections: Not on file  Intimate Partner Violence: Not At Risk (05/23/2023)   Humiliation, Afraid, Rape, and Kick questionnaire    Fear of Current or Ex-Partner: No    Emotionally Abused: No    Physically Abused: No    Sexually Abused: No    Past Surgical History:  Procedure Laterality Date   BOWEL RESECTION  02/09/2020   Procedure: SMALL BOWEL RESECTION;  Surgeon: Henrene Dodge, MD;  Location: [facility];  Service: General;;   DIALYSIS/PERMA CATHETER INSERTION N/A 05/28/2023    Procedure: DIALYSIS/PERMA CATHETER INSERTION;  Surgeon: Renford Dills, MD;  Location: [facility];  Service: Cardiovascular;  Laterality: N/A;   LAPAROTOMY N/A 02/09/2020   Procedure: EXPLORATORY LAPAROTOMY;  Surgeon: Henrene Dodge, MD;  Location: [facility];  Service: General;  Laterality: N/A;    Family History  Problem Relation Age of Onset   Breast cancer Neg Hx     Allergies  Allergen Reactions   Ace Inhibitors Anaphylaxis   Lisinopril Anaphylaxis   Lipitor [Atorvastatin] Other (See Comments)    Muscle aches       Latest Ref Rng & Units 05/31/2023    4:24 AM 05/30/2023    4:42 AM 05/29/2023    4:42 AM  CBC  WBC 4.0 - 10.5 K/uL 5.6  5.2  4.5   Hemoglobin 12.0 - 15.0 g/dL 7.9  8.2  7.2   Hematocrit 36.0 - 46.0 % 25.1  26.0  23.0   Platelets 150 - 400 K/uL 219  201  212       CMP     Component Value Date/Time   NA 132 (L) 05/31/2023 0424   NA 133 (L) 04/22/2013 1549   K 3.7 05/31/2023 0424   K 3.7 04/22/2013 1549   CL 97 (L) 05/31/2023 0424   CL 100 04/22/2013 1549   CO2 24 05/31/2023 0424   CO2 22 04/22/2013 1549   GLUCOSE 175 (H) 05/31/2023 0424   GLUCOSE 386 (H) 04/22/2013 1549   BUN 33 (H) 05/31/2023 0424   BUN 14 04/22/2013 1549   CREATININE 3.36 (H) 05/31/2023 0424   CREATININE 1.08 04/22/2013 1549   CALCIUM 8.9 05/31/2023 0424   CALCIUM 9.4 04/22/2013 1549   PROT 5.9 (L) 05/24/2023 0430   ALBUMIN 2.7 (L) 05/24/2023 0430   AST 14 (L) 05/24/2023 0430   ALT 13 05/24/2023 0430   ALKPHOS 145 (H) 05/24/2023 0430   BILITOT 0.4 05/24/2023 0430   GFRNONAA 15 (L) 05/31/2023 0424   GFRNONAA 59 (L) 04/22/2013 1549     No results found.     Assessment & Plan:   1. ESRD on dialysis [facility]) Recommend:  At this time the patient does not have appropriate extremity access for dialysis  Patient should have a left radiocephalic AV fistula created.  The risks, benefits and alternative therapies were reviewed in detail with the patient.  All questions  were answered.  The patient agrees to proceed with surgery.  The patient will follow up with me in the office after the surgery.  2. Primary hypertension Continue antihypertensive medications as already ordered, these medications have been reviewed and there are no changes at this time.  3. Type 2 diabetes mellitus with other diabetic kidney complication, with long-term current use of insulin (HCC) Continue hypoglycemic medications as already ordered, these medications have been reviewed and there are no changes at this time.  Hgb A1C to be monitored as already arranged by primary service   Current Outpatient Medications on File Prior to Visit  Medication Sig Dispense Refill   albuterol (PROVENTIL) (2.5 MG/3ML) 0.083% nebulizer solution Take 2.5 mg by nebulization every 4 (four) hours as needed for shortness of breath or wheezing.     allopurinol (ZYLOPRIM) 300 MG tablet Take 300 mg by mouth daily.     ASPIRIN ADULT LOW STRENGTH 81 MG EC tablet Take 81 mg by mouth daily.     carvedilol (COREG) 12.5 MG tablet Take 1 tablet (12.5 mg total) by mouth 2 (two) times daily before a meal. 60 tablet 0   cetirizine (ZYRTEC) 10 MG tablet Take 10 mg by mouth daily.     cyanocobalamin (VITAMIN B12) 1000 MCG tablet Take 1 tablet (1,000 mcg total) by mouth daily. 15 tablet 0   FEROSUL 325 (65 Fe) MG tablet Take 325 mg by mouth every other day.     furosemide (LASIX) 80 MG tablet Take 1 tablet (80 mg total) by mouth daily. For leg swelling or weight gain >5lbs 30 tablet 2   HUMULIN N KWIKPEN 100 UNIT/ML KwikPen Inject into the skin.     hydrALAZINE (APRESOLINE) 50 MG tablet Take 1 tablet (50 mg total) by mouth every 8 (eight) hours. 90 tablet 0   isosorbide mononitrate (IMDUR) 30 MG 24 hr tablet Take 1 tablet (30 mg total) by mouth 2 (two) times daily. 60 tablet 0   JANUVIA 100 MG tablet Take 100 mg by mouth daily.     omeprazole (PRILOSEC) 20 MG capsule Take 20 mg by mouth daily.     polyethylene  glycol (MIRALAX) 17 g packet Take 17 g by mouth daily. 30 each 0   triamcinolone ointment (KENALOG) 0.5 % Apply 1 Application topically 2 (two) times daily.     ZETIA 10 MG tablet Take 10 mg by mouth daily.     zolpidem (AMBIEN) 5 MG tablet Take 1 tablet (5 mg total) by mouth at bedtime as needed for sleep. 5 tablet 0   No current facility-administered medications on file prior to visit.    There are no Patient Instructions on file for this visit. No follow-ups on file.   Georgiana Spinner, NP

## 2023-07-29 ENCOUNTER — Telehealth (INDEPENDENT_AMBULATORY_CARE_PROVIDER_SITE_OTHER): Payer: Self-pay

## 2023-07-29 NOTE — Telephone Encounter (Signed)
Spoke with the patient and she is scheduled with Dr. Wyn Quaker on 08/08/23 at the [facility] for a left radialcephalic fistula. Pre-op is on 08/01/23 at 11:00 am at the [facility]. Pre-surgical instructions were discussed and will be sent to Mychart and mailed.

## 2023-07-31 ENCOUNTER — Other Ambulatory Visit (INDEPENDENT_AMBULATORY_CARE_PROVIDER_SITE_OTHER): Payer: Self-pay | Admitting: Nurse Practitioner

## 2023-07-31 DIAGNOSIS — N186 End stage renal disease: Secondary | ICD-10-CM

## 2023-07-31 NOTE — Patient Instructions (Signed)
Your procedure is scheduled on:08-08-23 Thursday Report to the Registration Desk on the 1st floor of the [facility].Then proceed to the 2nd floor Surgery Desk To find out your arrival time, please call [phone] between 1PM - 3PM on:08-07-23 Wednesday If your arrival time is 6:00 am, do not arrive before that time as the [facility] entrance doors do not open until 6:00 am.  REMEMBER: Instructions that are not followed completely may result in serious medical risk, up to and including death; or upon the discretion of your surgeon and anesthesiologist your surgery may need to be rescheduled.  Do not eat food OR drink any liquids after midnight the night before surgery.  No gum chewing or hard candies.  One week prior to surgery: Stop Anti-inflammatories (NSAIDS) such as Advil, Aleve, Ibuprofen, Motrin, Naproxen, Naprosyn and Aspirin based products such as Excedrin, Goody's Powder, BC Powder.You may however, take Tylenol if needed for pain up until the day of surgery. Stop ANY OVER THE COUNTER supplements/vitamins NOW (08-01-23) until after surgery.   Continue taking all prescribed medications with the exception of the following:  TAKE ONLY THESE MEDICATIONS THE MORNING OF SURGERY WITH A SIP OF WATER: -allopurinol (ZYLOPRIM)  -carvedilol (COREG)  -cetirizine (ZYRTEC)  -hydrALAZINE (APRESOLINE)  -isosorbide mononitrate (IMDUR)  -ZETIA  -omeprazole (PRILOSEC)-take one the night before and one on the morning of surgery - helps to prevent nausea after surgery.)  Use your Albuterol Nebulizer the morning of surgery  Continue your 81 mg Aspirin up until the day prior to surgery-Do NOT take the morning of surgery  Do NOT take any Insulin the morning of surgery  No Alcohol for 24 hours before or after surgery.  No Smoking including e-cigarettes for 24 hours before surgery.  No chewable tobacco products for at least 6 hours before surgery.  No nicotine patches on the day of surgery.  Do  not use any "recreational" drugs for at least a week (preferably 2 weeks) before your surgery.  Please be advised that the combination of cocaine and anesthesia may have negative outcomes, up to and including death. If you test positive for cocaine, your surgery will be cancelled.  On the morning of surgery brush your teeth with toothpaste and water, you may rinse your mouth with mouthwash if you wish. Do not swallow any toothpaste or mouthwash.  Use CHG Soap as directed on instruction sheet.  Do not wear jewelry, make-up, hairpins, clips or nail polish.  Do not wear lotions, powders, or perfumes.   Do not shave body hair from the neck down 48 hours before surgery.  Contact lenses, hearing aids and dentures may not be worn into surgery.  Do not bring valuables to the hospital. [facility] is not responsible for any missing/lost belongings or valuables.   Total Shoulder Arthroplasty:  use Benzoyl Peroxide 5% Gel as directed on instruction sheet.  Bring your C-PAP to the hospital in case you may have to spend the night.   Notify your doctor if there is any change in your medical condition (cold, fever, infection).  Wear comfortable clothing (specific to your surgery type) to the hospital.  After surgery, you can help prevent lung complications by doing breathing exercises.  Take deep breaths and cough every 1-2 hours. Your doctor may order a device called an Incentive Spirometer to help you take deep breaths. When coughing or sneezing, hold a pillow firmly against your incision with both hands. This is called "splinting." Doing this helps protect your incision. It  also decreases belly discomfort.  If you are being admitted to the hospital overnight, leave your suitcase in the car. After surgery it may be brought to your room.  In case of increased patient census, it may be necessary for you, the patient, to continue your postoperative care in the Same Day Surgery department.  If you  are being discharged the day of surgery, you will not be allowed to drive home. You will need a responsible individual to drive you home and stay with you for 24 hours after surgery.   If you are taking public transportation, you will need to have a responsible individual with you.  Please call the Pre-admissions Testing Dept. at [phone] if you have any questions about these instructions.  Surgery Visitation Policy:  Patients having surgery or a procedure may have two visitors.  Children under the age of 36 must have an adult with them who is not the patient.     Preparing for Surgery with CHLORHEXIDINE GLUCONATE (CHG) Soap  Chlorhexidine Gluconate (CHG) Soap  o An antiseptic cleaner that kills germs and bonds with the skin to continue killing germs even after washing  o Used for showering the night before surgery and morning of surgery  Before surgery, you can play an important role by reducing the number of germs on your skin.  CHG (Chlorhexidine gluconate) soap is an antiseptic cleanser which kills germs and bonds with the skin to continue killing germs even after washing.  Please do not use if you have an allergy to CHG or antibacterial soaps. If your skin becomes reddened/irritated stop using the CHG.  1. Shower the NIGHT BEFORE SURGERY and the MORNING OF SURGERY with CHG soap.  2. If you choose to wash your hair, wash your hair first as usual with your normal shampoo.  3. After shampooing, rinse your hair and body thoroughly to remove the shampoo.  4. Use CHG as you would any other liquid soap. You can apply CHG directly to the skin and wash gently with a scrungie or a clean washcloth.  5. Apply the CHG soap to your body only from the neck down. Do not use on open wounds or open sores. Avoid contact with your eyes, ears, mouth, and genitals (private parts). Wash face and genitals (private parts) with your normal soap.  6. Wash thoroughly, paying special attention to  the area where your surgery will be performed.  7. Thoroughly rinse your body with warm water.  8. Do not shower/wash with your normal soap after using and rinsing off the CHG soap.  9. Pat yourself dry with a clean towel.  10. Wear clean pajamas to bed the night before surgery.  12. Place clean sheets on your bed the night of your first shower and do not sleep with pets.  13. Shower again with the CHG soap on the day of surgery prior to arriving at the hospital.  14. Do not apply any deodorants/lotions/powders.  15. Please wear clean clothes to the hospital.

## 2023-08-01 ENCOUNTER — Encounter
Admission: RE | Admit: 2023-08-01 | Discharge: 2023-08-01 | Disposition: A | Discharge status: Home / Self Care | Payer: BC Managed Care – PPO | Source: Ambulatory Visit | Attending: Vascular Surgery | Admitting: Vascular Surgery

## 2023-08-01 VITALS — BP 113/62 | HR 81 | Resp 18 | Ht <= 58 in | Wt 194.4 lb

## 2023-08-01 DIAGNOSIS — Z01818 Encounter for other preprocedural examination: Secondary | ICD-10-CM

## 2023-08-01 DIAGNOSIS — Z01812 Encounter for preprocedural laboratory examination: Secondary | ICD-10-CM | POA: Insufficient documentation

## 2023-08-01 HISTORY — DX: Anemia, unspecified: D64.9

## 2023-08-01 HISTORY — DX: Dyspnea, unspecified: R06.00

## 2023-08-01 HISTORY — DX: Hypokalemia: E87.6

## 2023-08-01 HISTORY — DX: Bronchopneumonia, unspecified organism: J18.0

## 2023-08-01 HISTORY — DX: Localized edema: R60.0

## 2023-08-01 HISTORY — DX: Other and unspecified ventral hernia with obstruction, without gangrene: K43.6

## 2023-08-01 HISTORY — DX: Type 2 diabetes mellitus without complications: E11.9

## 2023-08-01 LAB — CBC WITH DIFFERENTIAL/PLATELET
Abs Immature Granulocytes: 0.03 10*3/uL (ref 0.00–0.07)
Basophils Absolute: 0 10*3/uL (ref 0.0–0.1)
Basophils Relative: 0 %
Eosinophils Absolute: 0.1 10*3/uL (ref 0.0–0.5)
Eosinophils Relative: 1 %
HCT: 28.2 % — ABNORMAL LOW (ref 36.0–46.0)
Hemoglobin: 9.1 g/dL — ABNORMAL LOW (ref 12.0–15.0)
Immature Granulocytes: 1 %
Lymphocytes Relative: 25 %
Lymphs Abs: 1.7 10*3/uL (ref 0.7–4.0)
MCH: 28.7 pg (ref 26.0–34.0)
MCHC: 32.3 g/dL (ref 30.0–36.0)
MCV: 89 fL (ref 80.0–100.0)
Monocytes Absolute: 0.7 10*3/uL (ref 0.1–1.0)
Monocytes Relative: 10 %
Neutro Abs: 4.2 10*3/uL (ref 1.7–7.7)
Neutrophils Relative %: 63 %
Platelets: 284 10*3/uL (ref 150–400)
RBC: 3.17 MIL/uL — ABNORMAL LOW (ref 3.87–5.11)
RDW: 13.5 % (ref 11.5–15.5)
WBC: 6.6 10*3/uL (ref 4.0–10.5)
nRBC: 0 % (ref 0.0–0.2)

## 2023-08-01 LAB — BASIC METABOLIC PANEL
Anion gap: 14 (ref 5–15)
BUN: 25 mg/dL — ABNORMAL HIGH (ref 8–23)
CO2: 27 mmol/L (ref 22–32)
Calcium: 9.1 mg/dL (ref 8.9–10.3)
Chloride: 98 mmol/L (ref 98–111)
Creatinine, Ser: 3.62 mg/dL — ABNORMAL HIGH (ref 0.44–1.00)
GFR, Estimated: 14 mL/min — ABNORMAL LOW (ref >60)
Glucose, Bld: 71 mg/dL (ref 70–99)
Potassium: 3.8 mmol/L (ref 3.5–5.1)
Sodium: 139 mmol/L (ref 135–145)

## 2023-08-01 LAB — TYPE AND SCREEN
ABO/RH(D): O POS
Antibody Screen: NEGATIVE

## 2023-08-08 ENCOUNTER — Ambulatory Visit: Payer: BC Managed Care – PPO | Admitting: Urgent Care

## 2023-08-08 ENCOUNTER — Encounter: Payer: Self-pay | Admitting: Vascular Surgery

## 2023-08-08 ENCOUNTER — Ambulatory Visit: Payer: BC Managed Care – PPO

## 2023-08-08 ENCOUNTER — Other Ambulatory Visit: Payer: Self-pay

## 2023-08-08 ENCOUNTER — Encounter
Admission: RE | Disposition: A | Discharge status: Home / Self Care | Payer: Self-pay | Source: Home / Self Care | Attending: Vascular Surgery

## 2023-08-08 ENCOUNTER — Ambulatory Visit
Admission: RE | Admit: 2023-08-08 | Discharge: 2023-08-08 | Disposition: A | Discharge status: Home / Self Care | Payer: BC Managed Care – PPO | Attending: Vascular Surgery | Admitting: Vascular Surgery

## 2023-08-08 DIAGNOSIS — I5032 Chronic diastolic (congestive) heart failure: Secondary | ICD-10-CM | POA: Diagnosis not present

## 2023-08-08 DIAGNOSIS — N186 End stage renal disease: Secondary | ICD-10-CM | POA: Insufficient documentation

## 2023-08-08 DIAGNOSIS — I70208 Unspecified atherosclerosis of native arteries of extremities, other extremity: Secondary | ICD-10-CM | POA: Diagnosis not present

## 2023-08-08 DIAGNOSIS — E1122 Type 2 diabetes mellitus with diabetic chronic kidney disease: Secondary | ICD-10-CM | POA: Insufficient documentation

## 2023-08-08 DIAGNOSIS — Z794 Long term (current) use of insulin: Secondary | ICD-10-CM | POA: Insufficient documentation

## 2023-08-08 DIAGNOSIS — Z01818 Encounter for other preprocedural examination: Secondary | ICD-10-CM

## 2023-08-08 DIAGNOSIS — I132 Hypertensive heart and chronic kidney disease with heart failure and with stage 5 chronic kidney disease, or end stage renal disease: Secondary | ICD-10-CM | POA: Insufficient documentation

## 2023-08-08 HISTORY — PX: AV FISTULA PLACEMENT: SHX1204

## 2023-08-08 LAB — POCT I-STAT, CHEM 8
BUN: 33 mg/dL — ABNORMAL HIGH (ref 8–23)
Calcium, Ion: 1.11 mmol/L — ABNORMAL LOW (ref 1.15–1.40)
Chloride: 104 mmol/L (ref 98–111)
Creatinine, Ser: 4.1 mg/dL — ABNORMAL HIGH (ref 0.44–1.00)
Glucose, Bld: 120 mg/dL — ABNORMAL HIGH (ref 70–99)
HCT: 31 % — ABNORMAL LOW (ref 36.0–46.0)
Hemoglobin: 10.5 g/dL — ABNORMAL LOW (ref 12.0–15.0)
Potassium: 3.7 mmol/L (ref 3.5–5.1)
Sodium: 138 mmol/L (ref 135–145)
TCO2: 25 mmol/L (ref 22–32)

## 2023-08-08 LAB — GLUCOSE, CAPILLARY: Glucose-Capillary: 125 mg/dL — ABNORMAL HIGH (ref 70–99)

## 2023-08-08 SURGERY — ARTERIOVENOUS (AV) FISTULA CREATION
Anesthesia: Regional | Site: Arm Lower | Laterality: Left

## 2023-08-08 MED ORDER — PHENYLEPHRINE 80 MCG/ML (10ML) SYRINGE FOR IV PUSH (FOR BLOOD PRESSURE SUPPORT)
PREFILLED_SYRINGE | INTRAVENOUS | Status: DC | PRN
  Administered 2023-08-08 (×2): 160 ug via INTRAVENOUS

## 2023-08-08 MED ORDER — ONDANSETRON HCL 4 MG/2ML IJ SOLN
INTRAMUSCULAR | Status: AC
  Filled 2023-08-08: qty 2

## 2023-08-08 MED ORDER — SODIUM CHLORIDE 0.9 % IV SOLN
INTRAVENOUS | Status: DC | PRN
  Administered 2023-08-08: 501 mL

## 2023-08-08 MED ORDER — ONDANSETRON HCL 4 MG/2ML IJ SOLN: 4.0000 mg | Freq: Four times a day (QID) | INTRAMUSCULAR | Status: DC | PRN

## 2023-08-08 MED ORDER — LIDOCAINE HCL (PF) 2 % IJ SOLN
INTRAMUSCULAR | Status: DC | PRN
Start: 2023-08-08 — End: 2023-08-08
  Administered 2023-08-08: 100 mg

## 2023-08-08 MED ORDER — CHLORHEXIDINE GLUCONATE 0.12 % MT SOLN
OROMUCOSAL | Status: AC
  Filled 2023-08-08: qty 15

## 2023-08-08 MED ORDER — ACETAMINOPHEN 10 MG/ML IV SOLN: 1000.0000 mg | Freq: Once | INTRAVENOUS | Status: DC | PRN

## 2023-08-08 MED ORDER — CEFAZOLIN SODIUM-DEXTROSE 2-4 GM/100ML-% IV SOLN
INTRAVENOUS | Status: AC
  Filled 2023-08-08: qty 100

## 2023-08-08 MED ORDER — CHLORHEXIDINE GLUCONATE CLOTH 2 % EX PADS
6.0000 | MEDICATED_PAD | Freq: Once | CUTANEOUS | Status: AC
  Administered 2023-08-08: 6 via TOPICAL

## 2023-08-08 MED ORDER — CEFAZOLIN SODIUM-DEXTROSE 2-4 GM/100ML-% IV SOLN
2.0000 g | INTRAVENOUS | Status: AC
  Administered 2023-08-08: 2 g via INTRAVENOUS

## 2023-08-08 MED ORDER — CHLORHEXIDINE GLUCONATE 0.12 % MT SOLN
15.0000 mL | Freq: Once | OROMUCOSAL | Status: AC
  Administered 2023-08-08: 15 mL via OROMUCOSAL

## 2023-08-08 MED ORDER — FENTANYL CITRATE PF 50 MCG/ML IJ SOSY
50.0000 ug | PREFILLED_SYRINGE | Freq: Once | INTRAMUSCULAR | Status: AC
  Administered 2023-08-08: 50 ug via INTRAVENOUS

## 2023-08-08 MED ORDER — LACTATED RINGERS IV SOLN: INTRAVENOUS | Status: DC

## 2023-08-08 MED ORDER — BUPIVACAINE HCL (PF) 0.5 % IJ SOLN
INTRAMUSCULAR | Status: AC
  Filled 2023-08-08: qty 30

## 2023-08-08 MED ORDER — BUPIVACAINE HCL (PF) 0.5 % IJ SOLN
INTRAMUSCULAR | Status: DC | PRN
  Administered 2023-08-08: 30 mL

## 2023-08-08 MED ORDER — HEPARIN SODIUM (PORCINE) 1000 UNIT/ML IJ SOLN
INTRAMUSCULAR | Status: DC | PRN
  Administered 2023-08-08: 3000 [IU] via INTRAVENOUS

## 2023-08-08 MED ORDER — OXYCODONE HCL 5 MG/5ML PO SOLN: 5.0000 mg | Freq: Once | ORAL | Status: DC | PRN

## 2023-08-08 MED ORDER — LIDOCAINE HCL (PF) 2 % IJ SOLN
INTRAMUSCULAR | Status: AC
  Filled 2023-08-08: qty 5

## 2023-08-08 MED ORDER — HYDROMORPHONE HCL 1 MG/ML IJ SOLN: 1.0000 mg | Freq: Once | INTRAMUSCULAR | Status: DC | PRN

## 2023-08-08 MED ORDER — PROPOFOL 1000 MG/100ML IV EMUL
INTRAVENOUS | Status: AC
  Filled 2023-08-08: qty 100

## 2023-08-08 MED ORDER — HEPARIN SODIUM (PORCINE) 5000 UNIT/ML IJ SOLN
INTRAMUSCULAR | Status: AC
  Filled 2023-08-08: qty 1

## 2023-08-08 MED ORDER — HYDROCODONE-ACETAMINOPHEN 5-325 MG PO TABS: 2.0000 | ORAL_TABLET | Freq: Four times a day (QID) | ORAL | 0 refills | Status: DC | PRN

## 2023-08-08 MED ORDER — MIDAZOLAM HCL 2 MG/2ML IJ SOLN
INTRAMUSCULAR | Status: AC
  Filled 2023-08-08: qty 2

## 2023-08-08 MED ORDER — EPHEDRINE SULFATE (PRESSORS) 50 MG/ML IJ SOLN
INTRAMUSCULAR | Status: DC | PRN
  Administered 2023-08-08: 10 mg via INTRAVENOUS
  Administered 2023-08-08 (×3): 5 mg via INTRAVENOUS

## 2023-08-08 MED ORDER — SODIUM CHLORIDE 0.9 % IV SOLN: INTRAVENOUS | Status: DC

## 2023-08-08 MED ORDER — PROPOFOL 500 MG/50ML IV EMUL
INTRAVENOUS | Status: DC | PRN
  Administered 2023-08-08: 80 ug/kg/min via INTRAVENOUS

## 2023-08-08 MED ORDER — FENTANYL CITRATE PF 50 MCG/ML IJ SOSY
PREFILLED_SYRINGE | INTRAMUSCULAR | Status: AC
  Filled 2023-08-08: qty 1

## 2023-08-08 MED ORDER — PROPOFOL 10 MG/ML IV BOLUS
INTRAVENOUS | Status: DC | PRN
Start: 2023-08-08 — End: 2023-08-08
  Administered 2023-08-08: 50 mg via INTRAVENOUS

## 2023-08-08 MED ORDER — FENTANYL CITRATE (PF) 100 MCG/2ML IJ SOLN: 25.0000 ug | INTRAMUSCULAR | Status: DC | PRN

## 2023-08-08 MED ORDER — MICROFIBRILLAR COLL HEMOSTAT EX PADS
MEDICATED_PAD | CUTANEOUS | Status: DC | PRN
  Administered 2023-08-08: 1 via TOPICAL

## 2023-08-08 MED ORDER — FENTANYL CITRATE (PF) 100 MCG/2ML IJ SOLN
INTRAMUSCULAR | Status: DC | PRN
  Administered 2023-08-08: 25 ug via INTRAVENOUS

## 2023-08-08 MED ORDER — ONDANSETRON HCL 4 MG/2ML IJ SOLN: 4.0000 mg | Freq: Once | INTRAMUSCULAR | Status: DC | PRN

## 2023-08-08 MED ORDER — ORAL CARE MOUTH RINSE: 15.0000 mL | Freq: Once | OROMUCOSAL | Status: AC

## 2023-08-08 MED ORDER — FENTANYL CITRATE (PF) 100 MCG/2ML IJ SOLN
INTRAMUSCULAR | Status: AC
  Filled 2023-08-08: qty 2

## 2023-08-08 MED ORDER — MIDAZOLAM HCL 2 MG/2ML IJ SOLN
2.0000 mg | Freq: Once | INTRAMUSCULAR | Status: AC
  Administered 2023-08-08: 1 mg via INTRAVENOUS

## 2023-08-08 MED ORDER — OXYCODONE HCL 5 MG PO TABS: 5.0000 mg | ORAL_TABLET | Freq: Once | ORAL | Status: DC | PRN

## 2023-08-08 MED ORDER — ONDANSETRON HCL 4 MG/2ML IJ SOLN
INTRAMUSCULAR | Status: DC | PRN
  Administered 2023-08-08: 4 mg via INTRAVENOUS

## 2023-08-08 MED ORDER — EPHEDRINE 5 MG/ML INJ
INTRAVENOUS | Status: AC
  Filled 2023-08-08: qty 5

## 2023-08-08 SURGICAL SUPPLY — 53 items
ADH SKN CLS APL DERMABOND .7 (GAUZE/BANDAGES/DRESSINGS) ×1
APL PRP STRL LF DISP 70% ISPRP (MISCELLANEOUS) ×1
BAG DECANTER FOR FLEXI CONT (MISCELLANEOUS) ×1 IMPLANT
BLADE SURG SZ11 CARB STEEL (BLADE) ×1 IMPLANT
BOOT SUTURE VASCULAR YLW (MISCELLANEOUS) ×1
BRUSH SCRUB EZ 4% CHG (MISCELLANEOUS) ×1 IMPLANT
CHLORAPREP W/TINT 26 (MISCELLANEOUS) ×1 IMPLANT
CLAMP SUTURE YELLOW 5 PAIRS (MISCELLANEOUS) ×1 IMPLANT
CLIP SPRNG 6 S-JAW DBL (CLIP) ×1 IMPLANT
CLIP SPRNG 6MM S-JAW DBL (CLIP) ×1
DERMABOND ADVANCED .7 DNX12 (GAUZE/BANDAGES/DRESSINGS) ×1 IMPLANT
DRESSING SURGICEL FIBRLLR 1X2 (HEMOSTASIS) ×1 IMPLANT
DRSG SURGICEL FIBRILLAR 1X2 (HEMOSTASIS) ×1
ELECT CAUTERY BLADE 6.4 (BLADE) ×1 IMPLANT
ELECT REM PT RETURN 9FT ADLT (ELECTROSURGICAL) ×1
ELECTRODE REM PT RTRN 9FT ADLT (ELECTROSURGICAL) ×1 IMPLANT
GEL ULTRASOUND 20GR AQUASONIC (MISCELLANEOUS) IMPLANT
GLOVE BIO SURGEON STRL SZ7 (GLOVE) ×2 IMPLANT
GOWN STRL REUS W/ TWL LRG LVL3 (GOWN DISPOSABLE) ×2 IMPLANT
GOWN STRL REUS W/ TWL XL LVL3 (GOWN DISPOSABLE) ×2 IMPLANT
GOWN STRL REUS W/TWL LRG LVL3 (GOWN DISPOSABLE) ×2
GOWN STRL REUS W/TWL XL LVL3 (GOWN DISPOSABLE) ×2
IV NS 500ML (IV SOLUTION) ×1
IV NS 500ML BAXH (IV SOLUTION) ×1 IMPLANT
KIT TURNOVER KIT A (KITS) ×1 IMPLANT
LABEL OR SOLS (LABEL) ×1 IMPLANT
LOOP VESSEL MAXI 1X406 RED (MISCELLANEOUS) ×1 IMPLANT
LOOP VESSEL MINI 0.8X406 BLUE (MISCELLANEOUS) ×1 IMPLANT
MANIFOLD NEPTUNE II (INSTRUMENTS) ×1 IMPLANT
NDL FILTER BLUNT 18X1 1/2 (NEEDLE) ×1 IMPLANT
NEEDLE FILTER BLUNT 18X1 1/2 (NEEDLE) ×1 IMPLANT
NS IRRIG 500ML POUR BTL (IV SOLUTION) ×1 IMPLANT
PACK EXTREMITY ARMC (MISCELLANEOUS) ×1 IMPLANT
PAD PREP OB/GYN DISP 24X41 (PERSONAL CARE ITEMS) ×1 IMPLANT
SOLUTION CELL SAVER (CLIP) ×1 IMPLANT
STOCKINETTE 48X4 2 PLY STRL (GAUZE/BANDAGES/DRESSINGS) ×1 IMPLANT
STOCKINETTE STRL 4IN 9604848 (GAUZE/BANDAGES/DRESSINGS) ×1 IMPLANT
SUT MNCRL AB 4-0 PS2 18 (SUTURE) ×1 IMPLANT
SUT PROLENE 6 0 BV (SUTURE) ×4 IMPLANT
SUT SILK 2 0 (SUTURE) ×1
SUT SILK 2-0 18XBRD TIE 12 (SUTURE) ×1 IMPLANT
SUT SILK 3 0 (SUTURE) ×1
SUT SILK 3-0 18XBRD TIE 12 (SUTURE) ×1 IMPLANT
SUT SILK 4 0 (SUTURE) ×1
SUT SILK 4-0 18XBRD TIE 12 (SUTURE) ×1 IMPLANT
SUT VIC AB 3-0 SH 27 (SUTURE)
SUT VIC AB 3-0 SH 27X BRD (SUTURE) ×1 IMPLANT
SYR 20ML LL LF (SYRINGE) ×1 IMPLANT
SYR 3ML LL SCALE MARK (SYRINGE) ×1 IMPLANT
SYR TB 1ML LL NO SAFETY (SYRINGE) IMPLANT
TAG SUTURE CLAMP YLW 5PR (MISCELLANEOUS) ×1
TRAP FLUID SMOKE EVACUATOR (MISCELLANEOUS) ×1 IMPLANT
WATER STERILE IRR 500ML POUR (IV SOLUTION) ×1 IMPLANT

## 2023-08-08 NOTE — Anesthesia Preprocedure Evaluation (Addendum)
Anesthesia Evaluation  Patient identified by MRN, date of birth, ID band Patient awake    Reviewed: Allergy & Precautions, NPO status , Patient's Chart, lab work & pertinent test results  History of Anesthesia Complications Negative for: history of anesthetic complications  Airway Mallampati: IV   Neck ROM: Full    Dental  (+) Missing, Poor Dentition   Pulmonary asthma    Pulmonary exam normal breath sounds clear to auscultation       Cardiovascular hypertension, +CHF  Normal cardiovascular exam Rhythm:Regular Rate:Normal  ECG 05/23/23: SR with occasional PVCs; LAD; low voltage; no ST changes   Neuro/Psych negative neurological ROS     GI/Hepatic ,GERD  ,,  Endo/Other  diabetes, Type 2  Class 3 obesity  Renal/GU Renal disease (stage V CKD)     Musculoskeletal   Abdominal   Peds  Hematology  (+) Blood dyscrasia, anemia   Anesthesia Other Findings   Reproductive/Obstetrics                             Anesthesia Physical Anesthesia Plan  ASA: 4  Anesthesia Plan: General and Regional   Post-op Pain Management: Regional block*   Induction: Intravenous  PONV Risk Score and Plan: 3 and Propofol infusion, TIVA, Treatment may vary due to age or medical condition and Ondansetron  Airway Management Planned: Natural Airway  Additional Equipment:   Intra-op Plan:   Post-operative Plan:   Informed Consent: I have reviewed the patients History and Physical, chart, labs and discussed the procedure including the risks, benefits and alternatives for the proposed anesthesia with the patient or authorized representative who has indicated his/her understanding and acceptance.       Plan Discussed with: CRNA  Anesthesia Plan Comments: (Plan for preoperative supraclavicular nerve block and GA with natural airway. LMA/GETA backup discussed.  Patient consented for risks of anesthesia including  but not limited to:  - adverse reactions to medications - damage to eyes, teeth, lips or other oral mucosa - nerve damage due to positioning  - sore throat or hoarseness - damage to heart, brain, nerves, lungs, other parts of body or loss of life  Informed patient about role of CRNA in peri- and intra-operative care.  Patient voiced understanding.)        Anesthesia Quick Evaluation

## 2023-08-08 NOTE — Discharge Instructions (Signed)
AMBULATORY SURGERY  DISCHARGE INSTRUCTIONS   The drugs that you were given will stay in your system until tomorrow so for the next 24 hours you should not:  Drive an automobile Make any legal decisions Drink any alcoholic beverage   You may resume regular meals tomorrow.  Today it is better to start with liquids and gradually work up to solid foods.  You may eat anything you prefer, but it is better to start with liquids, then soup and crackers, and gradually work up to solid foods.   Please notify your doctor immediately if you have any unusual bleeding, trouble breathing, redness and pain at the surgery site, drainage, fever, or pain not relieved by medication.    Additional Instructions:        Please contact your physician with any problems or Same Day Surgery at [phone], Monday through Friday 6 am to 4 pm, or [place] [facility] number at [phone].    Peripheral Nerve Block, Upper Extremity (PNBUE) Discharge Instructions    For your surgery you have received a Peripheral Nerve Block. Nerve Blocks affect many types of nerves, including nerves that control movement, pain and normal sensation.  You may experience feelings such as numbness, tingling, heaviness, weakness or the inability to move your arm or the feeling or sensation that your arm has "fallen asleep". A nerve block can last for 2 - 36 hours or more depending on the medication used.  Usually the weakness wears off first.  The tingling and heaviness usually wear off next.  Finally you may start to notice pain.  Keep in mind that this may occur in any order.  once a nerve block starts to wear off it is usually completely gone within 60 minutes. If needed, your surgeon will give you a prescription for pain medication.  It will take about 60 minutes for the oral pain medication to become fully effective.  So, it is recommended that you start taking this medication before the nerve block first begins to  wear off, or when you first begin to feel discomfort. Keep in mind that nerve blocks often wear off in the middle of the night.  If you are going to bed and the block has not started to wear off or you have not started to have any discomfort, consider setting an alarm for 2 to 3 hours, so you can assess your block.  If you notice the block is wearing off or you are starting to have discomfort, you can take your pain medication. Take your pain medication only as prescribed.  Pain medication can cause sedation and decrease your breathing if you take more than you need for the level of pain that you have. Nausea is a common side effect of many pain medications.  You may want to eat something before taking your pain medicine to prevent nausea. After a peripheral nerve block, you cannot feel pain, pressure or extremes in temperature in the effected arm.  Because your arm is numb it is at an increased risk for injury.  To decrease the possibility of injury, please practice the following:  While you are awake change the position of your arm frequently to prevent too much pressure on any one area for prolonged periods of time.  If you have a cast or tight dressing, check the color or your fingers every couple of hours.  Call your surgeon with the appearance of any discoloration (white or blue). If you are given a sling to  wear before you go home, please wear it  at all times until the block has completely worn off.  Do not get up at night without your sling. If you experience any problems or concerns, please contact your surgeon's office. If you experience severe or prolonged shortness of breath go to the nearest emergency department.

## 2023-08-08 NOTE — Transfer of Care (Signed)
Immediate Anesthesia Transfer of Care Note  Patient: Brianna [name]  Procedure(s) Performed: ARTERIOVENOUS (AV) FISTULA CREATION (RADIALCEPHALIC) (Left: Arm Lower)  Patient Location: PACU  Anesthesia Type:MAC combined with regional for post-op pain  Level of Consciousness: drowsy and patient cooperative  Airway & Oxygen Therapy: Patient Spontanous Breathing and Patient connected to face mask oxygen  Post-op Assessment: Report given to RN and Post -op Vital signs reviewed and stable  Post vital signs: Reviewed and stable  Last Vitals:  Vitals Value Taken Time  BP 101/51 08/08/23 0915  Temp 36.4 C 08/08/23 0915  Pulse 69 08/08/23 0920  Resp 17 08/08/23 0920  SpO2 99 % 08/08/23 0920  Vitals shown include unfiled device data.  Last Pain:  Vitals:   08/08/23 9629  TempSrc: Oral         Complications: No notable events documented.

## 2023-08-08 NOTE — Anesthesia Procedure Notes (Signed)
Anesthesia Regional Block: Supraclavicular block   Pre-Anesthetic Checklist: , timeout performed,  Correct Patient, Correct Site, Correct Laterality,  Correct Procedure,, risks and benefits discussed,  Surgical consent,  Pre-op evaluation,  At surgeon's request and post-op pain management  Laterality: Left  Prep: chloraprep       Needles:  Injection technique: Single-shot  Needle Type: Echogenic Needle          Additional Needles:   Procedures:,,,, ultrasound used (permanent image in chart),,   Motor weakness within 20 minutes.  Narrative:  Start time: 08/08/2023 7:30 AM End time: 08/08/2023 7:32 AM Injection made incrementally with aspirations every 5 mL.  Performed by: Personally  Anesthesiologist: Reed Breech, MD  Additional Notes: Functioning IV was confirmed and monitors applied.  Sterile prep and drape, hand hygiene and sterile gloves were used. Ultrasound guidance: relevant anatomy identified, needle position confirmed, local anesthetic spread visualized around nerve(s), vascular puncture avoided.  Image saved to electronic medical record.  Negative aspiration prior to incremental administration of local anesthetic for total 30 ml bupivacaine 0.5% given in supraclavicular distribution. The patient tolerated the procedure well. Vital signs and moderate sedation medications recorded in RN notes.

## 2023-08-08 NOTE — Op Note (Signed)
[facility]   OPERATIVE NOTE   PROCEDURE: Left radiocephaic arteriovenous fistula placement  PRE-OPERATIVE DIAGNOSIS: 1. ESRD  POST-OPERATIVE DIAGNOSIS: Same  SURGEON: Festus Barren, MD  ASSISTANT(S): Rolla Plate, NP  ANESTHESIA: general  ESTIMATED BLOOD LOSS: 10 cc  FINDING(S): none  SPECIMEN(S):  None  INDICATIONS:   Brianna [name] is a 62 y.o. female who presents with renal failure.  The patient is scheduled for left radiocephalic arteriovenous fistula placement when non-invasive studies suggested adequate anatomy for fistula creation at this location.  The patient is aware the risks include but are not limited to: bleeding, infection, steal syndrome, nerve damage, ischemic monomelic neuropathy, failure to mature, and need for additional procedures.  The patient is aware of the risks of the procedure and elects to proceed forward. An assistant was present during the procedure to help facilitate the exposure and expedite the procedure.   DESCRIPTION: After full informed written consent was obtained from the patient, the patient was brought back to the operating room and placed supine upon the operating table.  Prior to induction, the patient received IV antibiotics.  The assistant provided retraction and mobilization to help facilitate exposure and expedite the procedure throughout the entire procedure.  This included following suture, using retractors, and optimizing lighting.  After obtaining adequate anesthesia, the patient was then prepped and draped in the standard fashion for a left arm access procedure.  I turned my attention first to identifying the patient's distal cephalic vein and radial artery.  I made an incision at the level of the distal forearm and wrist and dissected through the subcutaneous tissue and fascia to gain exposure of the radial artery.  This was noted to be calcified but useable for fistula creation.  This was dissected out  proximally and distally and controlled with vessel loops.  I then dissected out the cephalic vein.  This was noted to be patent and adequate size for fistula creation. I then gave the patient 3000 units of intravenous heparin.  The distal segment of the vein was ligated with a  2-0 silk, and the vein was transected.  I then instilled the heparinized saline into the vein and clamped it.  At this point, I reset my exposure of the radial artery and placed the artery under tension proximally and distally.  I made an arteriotomy with a #11 blade, and then I extended the arteriotomy with a Potts scissor.  I injected heparinized saline proximal and distal to this arteriotomy.  The vein was then sewn to the artery in an end-to-side configuration with a running stitch of 6-0 Prolene.  Prior to completing this anastomosis, I allowed the vein and artery to backbleed.  There was no evidence of clot from any vessels.  I completed the anastomosis in the usual fashion and then released all vessel loops and clamps.  There was a palpable  thrill in the venous outflow, and there was a palpable radial pulse beyond the anastomosis.  At this point, I irrigated out the surgical wound. Surgicel was placed. There was no further active bleeding.  The subcutaneous tissue was reapproximated with a running stitch of 3-0 Vicryl.  The skin was then reapproximated with a running subcuticular stitch of 4-0 Monocryl.  The skin was then cleaned, dried, and reinforced with Dermabond.  The patient tolerated this procedure well and was taken to the recovery room in stable condition  COMPLICATIONS: None  CONDITION: Stable   Festus Barren, MD 08/08/2023 9:13 AM  This note was created with Dragon Medical transcription system. Any errors in dictation are purely unintentional.

## 2023-08-08 NOTE — Interval H&P Note (Signed)
History and Physical Interval Note:  08/08/2023 7:21 AM  Brianna [name]  has presented today for surgery, with the diagnosis of ESRD.  The various methods of treatment have been discussed with the patient and family. After consideration of risks, benefits and other options for treatment, the patient has consented to  Procedure(s): ARTERIOVENOUS (AV) FISTULA CREATION (RADIALCEPHALIC) (Left) as a surgical intervention.  The patient's history has been reviewed, patient examined, no change in status, stable for surgery.  I have reviewed the patient's chart and labs.  Questions were answered to the patient's satisfaction.     Festus Barren

## 2023-08-08 NOTE — Anesthesia Postprocedure Evaluation (Signed)
Anesthesia Post Note  Patient: Brianna [name]  Procedure(s) Performed: ARTERIOVENOUS (AV) FISTULA CREATION (RADIALCEPHALIC) (Left: Arm Lower)  Patient location during evaluation: PACU Anesthesia Type: Regional Level of consciousness: awake and alert, oriented and patient cooperative Pain management: pain level controlled Vital Signs Assessment: post-procedure vital signs reviewed and stable Respiratory status: spontaneous breathing, nonlabored ventilation and respiratory function stable Cardiovascular status: blood pressure returned to baseline and stable Postop Assessment: adequate PO intake Anesthetic complications: no   No notable events documented.   Last Vitals:  Vitals:   08/08/23 0944 08/08/23 0956  BP: 97/64 108/60  Pulse: 72 82  Resp: (!) 23 16  Temp: (!) 36.3 C (!) 36.4 C  SpO2: 93% 91%    Last Pain:  Vitals:   08/08/23 0956  TempSrc: Temporal  PainSc: 0-No pain                 Reed Breech

## 2023-08-09 ENCOUNTER — Other Ambulatory Visit: Payer: BC Managed Care – PPO

## 2023-08-15 ENCOUNTER — Encounter: Payer: Self-pay | Admitting: Vascular Surgery

## 2023-08-16 ENCOUNTER — Encounter: Payer: Self-pay | Admitting: Vascular Surgery

## 2023-09-12 ENCOUNTER — Other Ambulatory Visit (INDEPENDENT_AMBULATORY_CARE_PROVIDER_SITE_OTHER): Payer: Self-pay | Admitting: Vascular Surgery

## 2023-09-12 DIAGNOSIS — N186 End stage renal disease: Secondary | ICD-10-CM

## 2023-09-12 DIAGNOSIS — Z9889 Other specified postprocedural states: Secondary | ICD-10-CM

## 2023-09-19 ENCOUNTER — Ambulatory Visit (INDEPENDENT_AMBULATORY_CARE_PROVIDER_SITE_OTHER): Payer: Medicaid Other | Admitting: Nurse Practitioner

## 2023-09-19 ENCOUNTER — Ambulatory Visit (INDEPENDENT_AMBULATORY_CARE_PROVIDER_SITE_OTHER): Payer: Medicare Other

## 2023-09-19 VITALS — BP 138/75 | HR 74 | Resp 18 | Ht <= 58 in | Wt 192.4 lb

## 2023-09-19 DIAGNOSIS — N186 End stage renal disease: Secondary | ICD-10-CM

## 2023-09-19 DIAGNOSIS — Z9889 Other specified postprocedural states: Secondary | ICD-10-CM | POA: Diagnosis not present

## 2023-09-22 ENCOUNTER — Encounter (INDEPENDENT_AMBULATORY_CARE_PROVIDER_SITE_OTHER): Payer: Self-pay | Admitting: Nurse Practitioner

## 2023-09-22 NOTE — Progress Notes (Signed)
Subjective:    Patient ID: Brianna [name], female    DOB: 07-16-1961, 62 y.o.   MRN: [id number] Chief Complaint  Patient presents with   Venous Insufficiency    The patient is a 62 year old female who had a left radiocephalic fistula placed on 08/08/2023.  The fistula has a good flow volume to 1.  The patient denies any fevers or chills or any significant swelling.  She does have some numbness in her but otherwise no significant symptoms of steal syndrome.  The largest issue is regarding her incision which has some significant scabbing over it indicating that there was an underlying dehiscence.  But no evidence of infection or drainage today.    Review of Systems  Skin:  Positive for wound.  All other systems reviewed and are negative.      Objective:   Physical Exam Vitals reviewed.  HENT:     Head: Normocephalic.  Cardiovascular:     Rate and Rhythm: Normal rate.     Pulses: Normal pulses.     Arteriovenous access: Left arteriovenous access is present.    Comments: Good thrill and bruit Skin:    General: Skin is warm and dry.  Neurological:     Mental Status: She is alert and oriented to person, place, and time.  Psychiatric:        Mood and Affect: Mood normal.        Behavior: Behavior normal.        Thought Content: Thought content normal.        Judgment: Judgment normal.     BP 138/75 (BP Location: Right Arm)   Pulse 74   Resp 18   Ht 4\' 9"  (1.448 m)   Wt 192 lb 6.4 oz (87.3 kg)   BMI 41.63 kg/m   Past Medical History:  Diagnosis Date   Acid reflux    Anasarca    Anemia    Asthma    well controlled   Bilateral leg edema    Bronchopneumonia    Chronic heart failure with preserved ejection fraction (HFpEF) (HCC)    a. 12/2020 Echo: EF 70-75%, no rwma, mild LVH, GrI DD, nl RV fxn, triv MR/AI.   CKD (chronic kidney disease), stage IV (HCC)    DM (diabetes mellitus), type 2 (HCC)    Dyspnea    Hypertension    Hypokalemia    Morbid obesity (HCC)     Strangulated ventral hernia     Social History   Socioeconomic History   Marital status: Single    Spouse name: Not on file   Number of children: Not on file   Years of education: Not on file   Highest education level: Not on file  Occupational History   Not on file  Tobacco Use   Smoking status: Never   Smokeless tobacco: Never  Vaping Use   Vaping status: Never Used  Substance and Sexual Activity   Alcohol use: Never   Drug use: Never   Sexual activity: Not Currently  Other Topics Concern   Not on file  Social History Narrative   Not on file   Social Determinants of Health   Financial Resource Strain: Not on file  Food Insecurity: No Food Insecurity (05/23/2023)   Hunger Vital Sign    Worried About Running Out of Food in the Last Year: Never true    Ran Out of Food in the Last Year: Never true  Transportation Needs: No Transportation Needs (05/23/2023)  PRAPARE - Administrator, Civil Service (Medical): No    Lack of Transportation (Non-Medical): No  Physical Activity: Not on file  Stress: Not on file  Social Connections: Not on file  Intimate Partner Violence: Not At Risk (05/23/2023)   Humiliation, Afraid, Rape, and Kick questionnaire    Fear of Current or Ex-Partner: No    Emotionally Abused: No    Physically Abused: No    Sexually Abused: No    Past Surgical History:  Procedure Laterality Date   AV FISTULA PLACEMENT Left 08/08/2023   Procedure: ARTERIOVENOUS (AV) FISTULA CREATION (RADIALCEPHALIC);  Surgeon: Annice Needy, MD;  Location: [facility] ORS;  Service: Vascular;  Laterality: Left;  regional with MAC   BOWEL RESECTION  02/09/2020   Procedure: SMALL BOWEL RESECTION;  Surgeon: Henrene Dodge, MD;  Location: [facility] ORS;  Service: General;;   DIALYSIS/PERMA CATHETER INSERTION N/A 05/28/2023   Procedure: DIALYSIS/PERMA CATHETER INSERTION;  Surgeon: Renford Dills, MD;  Location: [facility];  Service: Cardiovascular;  Laterality: N/A;    LAPAROTOMY N/A 02/09/2020   Procedure: EXPLORATORY LAPAROTOMY;  Surgeon: Henrene Dodge, MD;  Location: [facility] OR[facility];  Service: General;  Laterality: N/A;    Family History  Problem Relation Age of Onset   Breast cancer Neg Hx     Allergies  Allergen Reactions   Ace Inhibitors Anaphylaxis   Lisinopril Anaphylaxis   Lipitor [Atorvastatin] Other (See Comments)    Muscle aches       Latest Ref Rng & Units 08/08/2023    6:27 AM 08/01/2023   11:45 AM 05/31/2023    4:24 AM  CBC  WBC 4.0 - 10.5 K/uL  6.6  5.6   Hemoglobin 12.0 - 15.0 g/dL 09.8  9.1  7.9   Hematocrit 36.0 - 46.0 % 31.0  28.2  25.1   Platelets 150 - 400 K/uL  284  219       CMP     Component Value Date/Time   NA 138 08/08/2023 0627   NA 133 (L) 04/22/2013 1549   K 3.7 08/08/2023 0627   K 3.7 04/22/2013 1549   CL 104 08/08/2023 0627   CL 100 04/22/2013 1549   CO2 27 08/01/2023 1145   CO2 22 04/22/2013 1549   GLUCOSE 120 (H) 08/08/2023 0627   GLUCOSE 386 (H) 04/22/2013 1549   BUN 33 (H) 08/08/2023 0627   BUN 14 04/22/2013 1549   CREATININE 4.10 (H) 08/08/2023 0627   CREATININE 1.08 04/22/2013 1549   CALCIUM 9.1 08/01/2023 1145   CALCIUM 9.4 04/22/2013 1549   PROT 5.9 (L) 05/24/2023 0430   ALBUMIN 2.7 (L) 05/24/2023 0430   AST 14 (L) 05/24/2023 0430   ALT 13 05/24/2023 0430   ALKPHOS 145 (H) 05/24/2023 0430   BILITOT 0.4 05/24/2023 0430   GFRNONAA 14 (L) 08/01/2023 1145   GFRNONAA 59 (L) 04/22/2013 1549     No results found.     Assessment & Plan:   1. ESRD (end stage renal disease) (HCC) Today the patient has a good flow volume for radiocephalic fistula but the fistula still has a wound present.  The wound is scabbed over and based on this we stopped and any topical treatment but we will have her return in 4 weeks in order to reevaluate the fistula before allowing.  Was   Current Outpatient Medications on File Prior to Visit  Medication Sig Dispense Refill   albuterol (PROVENTIL) (2.5 MG/3ML) 0.083%  nebulizer solution Take 2.5 mg  by nebulization every 4 (four) hours as needed for shortness of breath or wheezing.     albuterol (VENTOLIN HFA) 108 (90 Base) MCG/ACT inhaler Inhale 1-2 puffs into the lungs every 6 (six) hours as needed for wheezing or shortness of breath.     allopurinol (ZYLOPRIM) 300 MG tablet Take 300 mg by mouth every morning.     ASPIRIN ADULT LOW STRENGTH 81 MG EC tablet Take 81 mg by mouth daily.     carvedilol (COREG) 12.5 MG tablet Take 1 tablet (12.5 mg total) by mouth 2 (two) times daily before a meal. 60 tablet 0   cetirizine (ZYRTEC) 10 MG tablet Take 10 mg by mouth every morning.     cyanocobalamin (VITAMIN B12) 1000 MCG tablet Take 1 tablet (1,000 mcg total) by mouth daily. 15 tablet 0   FEROSUL 325 (65 Fe) MG tablet Take 325 mg by mouth every other day.     furosemide (LASIX) 80 MG tablet Take 1 tablet (80 mg total) by mouth daily. For leg swelling or weight gain >5lbs (Patient taking differently: Take 80 mg by mouth every morning. For leg swelling or weight gain >5lbs) 30 tablet 2   HUMULIN N KWIKPEN 100 UNIT/ML KwikPen Inject 25 Units into the skin every morning.     hydrALAZINE (APRESOLINE) 50 MG tablet Take 1 tablet (50 mg total) by mouth every 8 (eight) hours. 90 tablet 0   HYDROcodone-acetaminophen (NORCO/VICODIN) 5-325 MG tablet Take 2 tablets by mouth every 6 (six) hours as needed for moderate pain. 20 tablet 0   isosorbide mononitrate (IMDUR) 30 MG 24 hr tablet Take 1 tablet (30 mg total) by mouth 2 (two) times daily. 60 tablet 0   JANUVIA 100 MG tablet Take 100 mg by mouth every morning.     omeprazole (PRILOSEC) 20 MG capsule Take 20 mg by mouth every morning.     polyethylene glycol (MIRALAX) 17 g packet Take 17 g by mouth daily. (Patient taking differently: Take 17 g by mouth daily as needed.) 30 each 0   triamcinolone ointment (KENALOG) 0.5 % Apply 1 Application topically 2 (two) times daily.     ZETIA 10 MG tablet Take 10 mg by mouth every morning.      zolpidem (AMBIEN) 5 MG tablet Take 1 tablet (5 mg total) by mouth at bedtime as needed for sleep. 5 tablet 0   No current facility-administered medications on file prior to visit.    There are no Patient Instructions on file for this visit. No follow-ups on file.   Georgiana Spinner, NP

## 2023-10-16 NOTE — Progress Notes (Unsigned)
Patient ID: Brianna [name], female   DOB: January 04, 1961, 62 y.o.   MRN: [id number]  No chief complaint on file.   HPI Brianna [name] is a 62 y.o. female.    Procedure 08/08/2023: Left radiocephaic arteriovenous fistula placement   She denies pain or other problems at the left wrist site.  She denies problems with her catheter.   Past Medical History:  Diagnosis Date   Acid reflux    Anasarca    Anemia    Asthma    well controlled   Bilateral leg edema    Bronchopneumonia    Chronic heart failure with preserved ejection fraction (HFpEF) (HCC)    a. 12/2020 Echo: EF 70-75%, no rwma, mild LVH, GrI DD, nl RV fxn, triv MR/AI.   CKD (chronic kidney disease), stage IV (HCC)    DM (diabetes mellitus), type 2 (HCC)    Dyspnea    Hypertension    Hypokalemia    Morbid obesity (HCC)    Strangulated ventral hernia     Past Surgical History:  Procedure Laterality Date   AV FISTULA PLACEMENT Left 08/08/2023   Procedure: ARTERIOVENOUS (AV) FISTULA CREATION (RADIALCEPHALIC);  Surgeon: Annice Needy, MD;  Location: [facility];  Service: Vascular;  Laterality: Left;  regional with MAC   BOWEL RESECTION  02/09/2020   Procedure: SMALL BOWEL RESECTION;  Surgeon: Henrene Dodge, MD;  Location: [facility];  Service: General;;   DIALYSIS/PERMA CATHETER INSERTION N/A 05/28/2023   Procedure: DIALYSIS/PERMA CATHETER INSERTION;  Surgeon: Renford Dills, MD;  Location: [facility];  Service: Cardiovascular;  Laterality: N/A;   LAPAROTOMY N/A 02/09/2020   Procedure: EXPLORATORY LAPAROTOMY;  Surgeon: Henrene Dodge, MD;  Location: [facility];  Service: General;  Laterality: N/A;      Allergies  Allergen Reactions   Ace Inhibitors Anaphylaxis   Lisinopril Anaphylaxis   Lipitor [Atorvastatin] Other (See Comments)    Muscle aches    Current Outpatient Medications  Medication Sig Dispense Refill   albuterol (PROVENTIL) (2.5 MG/3ML) 0.083% nebulizer solution Take 2.5 mg by nebulization  every 4 (four) hours as needed for shortness of breath or wheezing.     albuterol (VENTOLIN HFA) 108 (90 Base) MCG/ACT inhaler Inhale 1-2 puffs into the lungs every 6 (six) hours as needed for wheezing or shortness of breath.     allopurinol (ZYLOPRIM) 300 MG tablet Take 300 mg by mouth every morning.     ASPIRIN ADULT LOW STRENGTH 81 MG EC tablet Take 81 mg by mouth daily.     carvedilol (COREG) 12.5 MG tablet Take 1 tablet (12.5 mg total) by mouth 2 (two) times daily before a meal. 60 tablet 0   cetirizine (ZYRTEC) 10 MG tablet Take 10 mg by mouth every morning.     cyanocobalamin (VITAMIN B12) 1000 MCG tablet Take 1 tablet (1,000 mcg total) by mouth daily. 15 tablet 0   FEROSUL 325 (65 Fe) MG tablet Take 325 mg by mouth every other day.     furosemide (LASIX) 80 MG tablet Take 1 tablet (80 mg total) by mouth daily. For leg swelling or weight gain >5lbs (Patient taking differently: Take 80 mg by mouth every morning. For leg swelling or weight gain >5lbs) 30 tablet 2   HUMULIN N KWIKPEN 100 UNIT/ML KwikPen Inject 25 Units into the skin every morning.     hydrALAZINE (APRESOLINE) 50 MG tablet Take 1 tablet (50 mg total) by mouth every 8 (eight) hours. 90 tablet 0  HYDROcodone-acetaminophen (NORCO/VICODIN) 5-325 MG tablet Take 2 tablets by mouth every 6 (six) hours as needed for moderate pain. 20 tablet 0   isosorbide mononitrate (IMDUR) 30 MG 24 hr tablet Take 1 tablet (30 mg total) by mouth 2 (two) times daily. 60 tablet 0   JANUVIA 100 MG tablet Take 100 mg by mouth every morning.     omeprazole (PRILOSEC) 20 MG capsule Take 20 mg by mouth every morning.     polyethylene glycol (MIRALAX) 17 g packet Take 17 g by mouth daily. (Patient taking differently: Take 17 g by mouth daily as needed.) 30 each 0   triamcinolone ointment (KENALOG) 0.5 % Apply 1 Application topically 2 (two) times daily.     ZETIA 10 MG tablet Take 10 mg by mouth every morning.     zolpidem (AMBIEN) 5 MG tablet Take 1 tablet  (5 mg total) by mouth at bedtime as needed for sleep. 5 tablet 0   No current facility-administered medications for this visit.        Physical Exam There were no vitals taken for this visit. Gen:  WD/WN, NAD Skin: incision C/D/I left wrist fistula with palpable thrill and good bruit.  The vein is not easy to palpate.     Assessment/Plan: 1. End stage renal disease [facility]) Patient is approximately 8 weeks status post fistula creation left radiocephalic.  Initial ultrasound from 1 month ago demonstrated a flow volume of 900 cc/min.  Fistula on examination feels good but is very difficult to appreciate and therefore would not be easily accessed.  I will see her back in 1 month I will repeat the ultrasound hopefully the size of the vein has increased appropriately and the flow volume is greater than 1000.  In the meantime continue to use catheter. - VAS US DUPLEX DIALYSIS ACCESS (AVF, AVG); Future      [name] 10/16/2023, 8:14 PM   This note was created with Dragon medical transcription system.  Any errors from dictation are unintentional.

## 2023-10-17 ENCOUNTER — Ambulatory Visit (INDEPENDENT_AMBULATORY_CARE_PROVIDER_SITE_OTHER): Payer: Medicare Other | Admitting: Vascular Surgery

## 2023-10-17 ENCOUNTER — Encounter (INDEPENDENT_AMBULATORY_CARE_PROVIDER_SITE_OTHER): Payer: Self-pay | Admitting: Vascular Surgery

## 2023-10-17 VITALS — BP 109/59 | HR 75 | Resp 16 | Wt 188.6 lb

## 2023-10-17 DIAGNOSIS — N186 End stage renal disease: Secondary | ICD-10-CM

## 2023-10-23 ENCOUNTER — Emergency Department
Admission: EM | Admit: 2023-10-23 | Discharge: 2023-10-23 | Disposition: A | Discharge status: Home / Self Care | Payer: Medicare Other | Attending: Emergency Medicine | Admitting: Emergency Medicine

## 2023-10-23 ENCOUNTER — Other Ambulatory Visit: Payer: Self-pay

## 2023-10-23 ENCOUNTER — Emergency Department: Payer: Medicare Other

## 2023-10-23 DIAGNOSIS — R519 Headache, unspecified: Secondary | ICD-10-CM | POA: Diagnosis present

## 2023-10-23 DIAGNOSIS — Z992 Dependence on renal dialysis: Secondary | ICD-10-CM | POA: Insufficient documentation

## 2023-10-23 DIAGNOSIS — N186 End stage renal disease: Secondary | ICD-10-CM | POA: Insufficient documentation

## 2023-10-23 DIAGNOSIS — E1122 Type 2 diabetes mellitus with diabetic chronic kidney disease: Secondary | ICD-10-CM | POA: Diagnosis not present

## 2023-10-23 DIAGNOSIS — I120 Hypertensive chronic kidney disease with stage 5 chronic kidney disease or end stage renal disease: Secondary | ICD-10-CM | POA: Insufficient documentation

## 2023-10-23 LAB — COMPREHENSIVE METABOLIC PANEL
ALT: 10 U/L (ref 0–44)
AST: 16 U/L (ref 15–41)
Albumin: 3.9 g/dL (ref 3.5–5.0)
Alkaline Phosphatase: 86 U/L (ref 38–126)
Anion gap: 15 (ref 5–15)
BUN: 20 mg/dL (ref 8–23)
CO2: 24 mmol/L (ref 22–32)
Calcium: 9 mg/dL (ref 8.9–10.3)
Chloride: 96 mmol/L — ABNORMAL LOW (ref 98–111)
Creatinine, Ser: 3.03 mg/dL — ABNORMAL HIGH (ref 0.44–1.00)
GFR, Estimated: 17 mL/min — ABNORMAL LOW (ref >60)
Glucose, Bld: 146 mg/dL — ABNORMAL HIGH (ref 70–99)
Potassium: 2.5 mmol/L — CL (ref 3.5–5.1)
Sodium: 135 mmol/L (ref 135–145)
Total Bilirubin: 0.8 mg/dL (ref 0.3–1.2)
Total Protein: 7.9 g/dL (ref 6.5–8.1)

## 2023-10-23 LAB — CBC
HCT: 41.3 % (ref 36.0–46.0)
Hemoglobin: 13.6 g/dL (ref 12.0–15.0)
MCH: 28.6 pg (ref 26.0–34.0)
MCHC: 32.9 g/dL (ref 30.0–36.0)
MCV: 86.9 fL (ref 80.0–100.0)
Platelets: 242 10*3/uL (ref 150–400)
RBC: 4.75 MIL/uL (ref 3.87–5.11)
RDW: 15.3 % (ref 11.5–15.5)
WBC: 5.8 10*3/uL (ref 4.0–10.5)
nRBC: 0 % (ref 0.0–0.2)

## 2023-10-23 MED ORDER — POTASSIUM CHLORIDE CRYS ER 20 MEQ PO TBCR
20.0000 meq | EXTENDED_RELEASE_TABLET | Freq: Once | ORAL | Status: AC
  Administered 2023-10-23: 20 meq via ORAL
  Filled 2023-10-23: qty 1

## 2023-10-23 MED ORDER — ACETAMINOPHEN 500 MG PO TABS
1000.0000 mg | ORAL_TABLET | Freq: Once | ORAL | Status: AC
  Administered 2023-10-23: 1000 mg via ORAL
  Filled 2023-10-23: qty 2

## 2023-10-23 MED ORDER — PROCHLORPERAZINE EDISYLATE 10 MG/2ML IJ SOLN
10.0000 mg | Freq: Once | INTRAMUSCULAR | Status: AC
  Administered 2023-10-23: 10 mg via INTRAVENOUS
  Filled 2023-10-23: qty 2

## 2023-10-23 MED ORDER — DIPHENHYDRAMINE HCL 50 MG/ML IJ SOLN
25.0000 mg | Freq: Once | INTRAMUSCULAR | Status: AC
  Administered 2023-10-23: 25 mg via INTRAVENOUS
  Filled 2023-10-23: qty 1

## 2023-10-23 NOTE — ED Provider Notes (Signed)
[facility] Provider Note    Event Date/Time   First MD Initiated Contact with Patient 10/23/23 1413     (approximate)   History   Headache   HPI  Shuntel Jendro is a 62 year old female with history of T2DM, HTN, ESRD on HD presenting to the emergency department for evaluation of headache.  Patient reports that a week ago she had onset of a frontal headache, nonradiating.  Not sudden in onset, but reports that this is different than her prior headaches and more intense.  Initially started after receiving dialysis.  Did complete her dialysis session today and thinks it may be slightly worse following this.  No numbness, tingling, focal weakness.  No recent head trauma.      Physical Exam   Triage Vital Signs: ED Triage Vitals  Encounter Vitals Group     BP 10/23/23 1343 115/64     Systolic BP Percentile --      Diastolic BP Percentile --      Pulse Rate 10/23/23 1343 83     Resp 10/23/23 1343 18     Temp 10/23/23 1343 98 F (36.7 C)     Temp src --      SpO2 10/23/23 1343 100 %     Weight 10/23/23 1338 187 lb (84.8 kg)     Height 10/23/23 1338 4\' 9"  (1.448 m)     Head Circumference --      Peak Flow --      Pain Score 10/23/23 1338 10     Pain Loc --      Pain Education --      Exclude from Growth Chart --     Most recent vital signs: Vitals:   10/23/23 1343  BP: 115/64  Pulse: 83  Resp: 18  Temp: 98 F (36.7 C)  SpO2: 100%     General: Awake, interactive  CV:  Regular rate, good peripheral perfusion, palpable thrill over left upper extremity developing fistula Resp:  Unlabored respirations.  Abd:  Nondistended.  Neuro:  Symmetric facial movement, 5 out of 5 strength in the bilateral upper and lower extremities, normal finger-to-nose testing, intact sensation over bilateral upper and lower extremities  ED Results / Procedures / Treatments   Labs (all labs ordered are listed, but only abnormal results are displayed) Labs  Reviewed  CBC  COMPREHENSIVE METABOLIC PANEL     EKG EKG independently reviewed interpreted by myself (ER attending) demonstrates:    RADIOLOGY Imaging independently reviewed and interpreted by myself demonstrates:  CT head pending  PROCEDURES:  Critical Care performed: No  Procedures   MEDICATIONS ORDERED IN ED: Medications  acetaminophen (TYLENOL) tablet 1,000 mg (has no administration in time range)  diphenhydrAMINE (BENADRYL) injection 25 mg (has no administration in time range)  prochlorperazine (COMPAZINE) injection 10 mg (has no administration in time range)     IMPRESSION / MDM / ASSESSMENT AND PLAN / ED COURSE  I reviewed the triage vital signs and the nursing notes.  Differential diagnosis includes, but is not limited to, intracranial bleed, benign headache, anemia, electrolyte abnormality, headache secondary to volume shifts during dialysis  Patient's presentation is most consistent with acute presentation with potential threat to life or bodily function.  62 year old female presenting to the emergency department for evaluation of ongoing headache.  No focal deficits on exam, but does report that headache is different than her prior.  Labs sent from triage.  Will obtain CT, treat symptomatically.  If head  CT reassuring and feeling improved, do think she will be stable for discharge home. Signed out to oncoming provider pending CT, reevaluation, and disposition.       FINAL CLINICAL IMPRESSION(S) / ED DIAGNOSES   Final diagnoses:  Acute nonintractable headache, unspecified headache type     Rx / DC Orders   ED Discharge Orders     None        Note:  This document was prepared using Dragon voice recognition software and may include unintentional dictation errors.   Trinna Post, MD 10/23/23 [phone]

## 2023-10-23 NOTE — ED Triage Notes (Addendum)
First nurse note: Pt to ED [facility] from [facility] for h/a x7 days.  received dialysis today. VSS

## 2023-10-23 NOTE — ED Provider Notes (Signed)
CT scan without findings, patient feeling improved after treatment, appropriate for discharge with outpatient follow-up with PCP, return precautions discussed   Jene Every, MD 10/23/23 1843

## 2023-11-14 ENCOUNTER — Ambulatory Visit (INDEPENDENT_AMBULATORY_CARE_PROVIDER_SITE_OTHER): Payer: Medicare Other

## 2023-11-14 ENCOUNTER — Ambulatory Visit (INDEPENDENT_AMBULATORY_CARE_PROVIDER_SITE_OTHER): Payer: Medicare Other | Admitting: Vascular Surgery

## 2023-11-14 ENCOUNTER — Encounter (INDEPENDENT_AMBULATORY_CARE_PROVIDER_SITE_OTHER): Payer: Self-pay | Admitting: Vascular Surgery

## 2023-11-14 VITALS — BP 136/78 | HR 83 | Resp 16 | Wt 178.0 lb

## 2023-11-14 DIAGNOSIS — I10 Essential (primary) hypertension: Secondary | ICD-10-CM

## 2023-11-14 DIAGNOSIS — Z794 Long term (current) use of insulin: Secondary | ICD-10-CM

## 2023-11-14 DIAGNOSIS — N186 End stage renal disease: Secondary | ICD-10-CM

## 2023-11-14 DIAGNOSIS — J4520 Mild intermittent asthma, uncomplicated: Secondary | ICD-10-CM

## 2023-11-14 DIAGNOSIS — E119 Type 2 diabetes mellitus without complications: Secondary | ICD-10-CM

## 2023-11-14 NOTE — Progress Notes (Signed)
MRN : [id number]  Brianna [name] is a 62 y.o. (10/18/1961) female who presents with chief complaint of check access.  History of Present Illness:   The patient is seen for evaluation of dialysis access.    Procedure 08/08/2023: Left radiocephaic arteriovenous fistula placement   Current access is via a catheter which is functioning adequately, the flow rates have been acceptable.  There have not been multiple episodes of catheter infection.  The patient denies fever and chills while on dialysis.  No tenderness or drainage at the exit site.  No recent shortening of the patient's walking distance or new symptoms consistent with claudication.  No history of rest pain symptoms. No new ulcers or wounds of the lower extremities have occurred.  The patient denies amaurosis fugax or recent TIA symptoms. There are no recent neurological changes noted. There is no history of DVT, PE or superficial thrombophlebitis. No recent episodes of angina or shortness of breath documented.   Duplex ultrasound of the left radiocephalic fistula is obtained today.  This demonstrates a patent fistula without evidence for stricture or stenosis.  However, flow volume is 707 cc/min  Current Meds  Medication Sig   albuterol (PROVENTIL) (2.5 MG/3ML) 0.083% nebulizer solution Take 2.5 mg by nebulization every 4 (four) hours as needed for shortness of breath or wheezing.   albuterol (VENTOLIN HFA) 108 (90 Base) MCG/ACT inhaler Inhale 1-2 puffs into the lungs every 6 (six) hours as needed for wheezing or shortness of breath.   allopurinol (ZYLOPRIM) 300 MG tablet Take 300 mg by mouth every morning.   ASPIRIN ADULT LOW STRENGTH 81 MG EC tablet Take 81 mg by mouth daily.   carvedilol (COREG) 12.5 MG tablet Take 1 tablet (12.5 mg total) by mouth 2 (two) times daily before a meal.   cetirizine (ZYRTEC) 10 MG tablet Take 10 mg by mouth every morning.   cyanocobalamin (VITAMIN B12) 1000 MCG tablet  Take 1 tablet (1,000 mcg total) by mouth daily.   FEROSUL 325 (65 Fe) MG tablet Take 325 mg by mouth every other day.   furosemide (LASIX) 80 MG tablet Take 1 tablet (80 mg total) by mouth daily. For leg swelling or weight gain >5lbs (Patient taking differently: Take 80 mg by mouth every morning. For leg swelling or weight gain >5lbs)   HUMULIN N KWIKPEN 100 UNIT/ML KwikPen Inject 25 Units into the skin every morning.   hydrALAZINE (APRESOLINE) 50 MG tablet Take 1 tablet (50 mg total) by mouth every 8 (eight) hours.   HYDROcodone-acetaminophen (NORCO/VICODIN) 5-325 MG tablet Take 2 tablets by mouth every 6 (six) hours as needed for moderate pain.   isosorbide mononitrate (IMDUR) 30 MG 24 hr tablet Take 1 tablet (30 mg total) by mouth 2 (two) times daily.   JANUVIA 100 MG tablet Take 100 mg by mouth every morning.   omeprazole (PRILOSEC) 20 MG capsule Take 20 mg by mouth every morning.   polyethylene glycol (MIRALAX) 17 g packet Take 17 g by mouth daily. (Patient taking differently: Take 17 g by mouth daily as needed.)   triamcinolone ointment (KENALOG) 0.5 % Apply 1 Application topically 2 (two) times daily.   ZETIA 10 MG tablet Take 10 mg by mouth every morning.   zolpidem (AMBIEN) 5 MG tablet Take 1 tablet (5 mg total) by mouth at bedtime as needed for sleep.    Past Medical History:  Diagnosis Date   Acid reflux    Anasarca    Anemia    Asthma    well controlled   Bilateral leg edema    Bronchopneumonia    Chronic heart failure with preserved ejection fraction (HFpEF) (HCC)    a. 12/2020 Echo: EF 70-75%, no rwma, mild LVH, GrI DD, nl RV fxn, triv MR/AI.   CKD (chronic kidney disease), stage IV (HCC)    DM (diabetes mellitus), type 2 (HCC)    Dyspnea    Hypertension    Hypokalemia    Morbid obesity (HCC)    Strangulated ventral hernia     Past Surgical History:  Procedure Laterality Date   AV FISTULA PLACEMENT Left 08/08/2023   Procedure: ARTERIOVENOUS (AV) FISTULA CREATION  (RADIALCEPHALIC);  Surgeon: Annice Needy, MD;  Location: [facility];  Service: Vascular;  Laterality: Left;  regional with MAC   BOWEL RESECTION  02/09/2020   Procedure: SMALL BOWEL RESECTION;  Surgeon: Henrene Dodge, MD;  Location: [facility];  Service: General;;   DIALYSIS/PERMA CATHETER INSERTION N/A 05/28/2023   Procedure: DIALYSIS/PERMA CATHETER INSERTION;  Surgeon: Renford Dills, MD;  Location: [facility];  Service: Cardiovascular;  Laterality: N/A;   LAPAROTOMY N/A 02/09/2020   Procedure: EXPLORATORY LAPAROTOMY;  Surgeon: Henrene Dodge, MD;  Location: [facility];  Service: General;  Laterality: N/A;    Social History Social History   Tobacco Use   Smoking status: Never   Smokeless tobacco: Never  Vaping Use   Vaping status: Never Used  Substance Use Topics   Alcohol use: Never   Drug use: Never    Family History Family History  Problem Relation Age of Onset   Breast cancer Neg Hx     Allergies  Allergen Reactions   Ace Inhibitors Anaphylaxis   Lisinopril Anaphylaxis   Lipitor [Atorvastatin] Other (See Comments)    Muscle aches     REVIEW OF SYSTEMS (Negative unless checked)  Constitutional: [] Weight loss  [] Fever  [] Chills Cardiac: [] Chest pain   [] Chest pressure   [] Palpitations   [] Shortness of breath when laying flat   [] Shortness of breath with exertion. Vascular:  [] Pain in legs with walking   [] Pain in legs at rest  [] History of DVT   [] Phlebitis   [] Swelling in legs   [] Varicose veins   [] Non-healing ulcers Pulmonary:   [] Uses home oxygen   [] Productive cough   [] Hemoptysis   [] Wheeze  [] COPD   [] Asthma Neurologic:  [] Dizziness   [] Seizures   [] History of stroke   [] History of TIA  [] Aphasia   [] Vissual changes   [] Weakness or numbness in arm   [] Weakness or numbness in leg Musculoskeletal:   [] Joint swelling   [] Joint pain   [] Low back pain Hematologic:  [] Easy bruising  [] Easy bleeding   [] Hypercoagulable state   [] Anemic Gastrointestinal:  [] Diarrhea    [] Vomiting  [] Gastroesophageal reflux/heartburn   [] Difficulty swallowing. Genitourinary:  [x] Chronic kidney disease   [] Difficult urination  [] Frequent urination   [] Blood in urine Skin:  [] Rashes   [] Ulcers  Psychological:  [] History of anxiety   []  History of major depression.  Physical Examination  Vitals:   11/14/23 0941  BP: 136/78  Pulse: 83  Resp: 16  Weight: 178 lb (80.7 kg)   Body mass index is 38.52 kg/m. Gen: WD/WN, NAD Head: [place]/AT, No temporalis wasting.  Ear/Nose/Throat: Hearing grossly intact, nares w/o erythema or drainage Eyes: PER, EOMI, sclera nonicteric.  Neck: Supple, no gross masses or lesions.  No JVD.  Pulmonary:  Good air movement, no audible wheezing, no use of accessory muscles.  Cardiac: RRR, precordium non-hyperdynamic. Vascular:   The left wrist AV fistula has a decent thrill and bruit.  However the pain is not easily palpable. Vessel Right Left  Radial Palpable Palpable  Brachial Palpable Palpable  Gastrointestinal: soft, non-distended. No guarding/no peritoneal signs.  Musculoskeletal: M/S 5/5 throughout.  No deformity.  Neurologic: CN 2-12 intact. Pain and light touch intact in extremities.  Symmetrical.  Speech is fluent. Motor exam as listed above. Psychiatric: Judgment intact, Mood & affect appropriate for pt's clinical situation. Dermatologic: No rashes or ulcers noted.  No changes consistent with cellulitis.   CBC Lab Results  Component Value Date   WBC 5.8 10/23/2023   HGB 13.6 10/23/2023   HCT 41.3 10/23/2023   MCV 86.9 10/23/2023   PLT 242 10/23/2023    BMET    Component Value Date/Time   NA 135 10/23/2023 1354   NA 133 (L) 04/22/2013 1549   K 2.5 (LL) 10/23/2023 1354   K 3.7 04/22/2013 1549   CL 96 (L) 10/23/2023 1354   CL 100 04/22/2013 1549   CO2 24 10/23/2023 1354   CO2 22 04/22/2013 1549   GLUCOSE 146 (H) 10/23/2023 1354   GLUCOSE 386 (H) 04/22/2013 1549   BUN 20 10/23/2023 1354   BUN 14 04/22/2013 1549    CREATININE 3.03 (H) 10/23/2023 1354   CREATININE 1.08 04/22/2013 1549   CALCIUM 9.0 10/23/2023 1354   CALCIUM 9.4 04/22/2013 1549   GFRNONAA 17 (L) 10/23/2023 1354   GFRNONAA 59 (L) 04/22/2013 1549   GFRAA 26 (L) 02/14/2020 0435   GFRAA >60 04/22/2013 1549   CrCl cannot be calculated (Patient's most recent lab result is older than the maximum 21 days allowed.).  COAG No results found for: "INR", "PROTIME"  Radiology CT Head Wo Contrast  Result Date: 10/23/2023 CLINICAL DATA:  Headaches EXAM: CT HEAD WITHOUT CONTRAST TECHNIQUE: Contiguous axial images were obtained from the base of the skull through the vertex without intravenous contrast. RADIATION DOSE REDUCTION: This exam was performed according to the departmental dose-optimization program which includes automated exposure control, adjustment of the mA and/or kV according to patient size and/or use of iterative reconstruction technique. COMPARISON:  None Available. FINDINGS: Brain: No evidence of acute infarction, hemorrhage, hydrocephalus, extra-axial collection or mass lesion/mass effect. Vascular: No hyperdense vessel or unexpected calcification. Skull: Normal. Negative for fracture or focal lesion. Sinuses/Orbits: No acute finding. Other: None. IMPRESSION: No acute abnormality noted. Electronically Signed   By: Alcide Clever M.D.   On: 10/23/2023 18:08     Assessment/Plan 1. End stage renal disease (HCC) Recommend:  The patient is doing well and currently has adequate dialysis access, via her catheter.  The patient's dialysis center is not reporting any access issues.  Duplex ultrasound of the AV fistula demonstrates a patent fistula.  However, the flow pattern is borderline adequate at 707 cc/min.  The patient should have a duplex ultrasound of the dialysis access in 2 months.  I discussed with the patient that if in 2 months her fistula is not readily accessible and ready to be used then at that point we would plan for a  fistulogram to see what can be done to improve and mature her fistula.  This is a bit of a difficult situation as part of the problem seems to be a relatively low volume flow but also the patient does have a rather large arm and  her vein is undoubtedly a bit deeper than we would prefer.  The patient will follow-up with me in the office after each ultrasound   - VAS US DUPLEX DIALYSIS ACCESS (AVF, AVG); Future  2. Primary hypertension Continue antihypertensive medications as already ordered, these medications have been reviewed and there are no changes at this time.  3. Intermittent asthma without complication, unspecified asthma severity Continue pulmonary medications and aerosols as already ordered, these medications have been reviewed and there are no changes at this time.   4. Type 2 diabetes mellitus without complication, with long-term current use of insulin (HCC) Continue hypoglycemic medications as already ordered, these medications have been reviewed and there are no changes at this time.  Hgb A1C to be monitored as already arranged by primary service    [name], MD  11/14/2023 9:57 AM

## 2024-01-14 ENCOUNTER — Other Ambulatory Visit (INDEPENDENT_AMBULATORY_CARE_PROVIDER_SITE_OTHER): Payer: Self-pay | Admitting: Vascular Surgery

## 2024-01-14 DIAGNOSIS — N186 End stage renal disease: Secondary | ICD-10-CM

## 2024-01-14 NOTE — Progress Notes (Deleted)
MRN : [id number]  Brianna [name] is a 63 y.o. (July 26, 1961) female who presents with chief complaint of check access.  History of Present Illness:   The patient is seen for evaluation of dialysis access.     Procedure 08/08/2023: Left radiocephaic arteriovenous fistula placement    Current access is via a catheter which is functioning adequately, the flow rates have been acceptable.  There have not been multiple episodes of catheter infection.  The patient denies fever and chills while on dialysis.  No tenderness or drainage at the exit site.   No recent shortening of the patient's walking distance or new symptoms consistent with claudication.  No history of rest pain symptoms. No new ulcers or wounds of the lower extremities have occurred.   The patient denies amaurosis fugax or recent TIA symptoms. There are no recent neurological changes noted. There is no history of DVT, PE or superficial thrombophlebitis. No recent episodes of angina or shortness of breath documented.    Duplex ultrasound of the left radiocephalic fistula is obtained today.  This demonstrates a patent fistula without evidence for stricture or stenosis.  However, flow volume is 707 cc/min  No outpatient medications have been marked as taking for the 01/16/24 encounter (Appointment) with Gilda Crease, Latina Craver, MD.    Past Medical History:  Diagnosis Date   Acid reflux    Anasarca    Anemia    Asthma    well controlled   Bilateral leg edema    Bronchopneumonia    Chronic heart failure with preserved ejection fraction (HFpEF) (HCC)    a. 12/2020 Echo: EF 70-75%, no rwma, mild LVH, GrI DD, nl RV fxn, triv MR/AI.   CKD (chronic kidney disease), stage IV (HCC)    DM (diabetes mellitus), type 2 (HCC)    Dyspnea    Hypertension    Hypokalemia    Morbid obesity (HCC)    Strangulated ventral hernia     Past Surgical History:  Procedure Laterality Date   AV FISTULA PLACEMENT Left 08/08/2023    Procedure: ARTERIOVENOUS (AV) FISTULA CREATION (RADIALCEPHALIC);  Surgeon: Annice Needy, MD;  Location: [facility];  Service: Vascular;  Laterality: Left;  regional with MAC   BOWEL RESECTION  02/09/2020   Procedure: SMALL BOWEL RESECTION;  Surgeon: Henrene Dodge, MD;  Location: [facility];  Service: General;;   DIALYSIS/PERMA CATHETER INSERTION N/A 05/28/2023   Procedure: DIALYSIS/PERMA CATHETER INSERTION;  Surgeon: Renford Dills, MD;  Location: [facility];  Service: Cardiovascular;  Laterality: N/A;   LAPAROTOMY N/A 02/09/2020   Procedure: EXPLORATORY LAPAROTOMY;  Surgeon: Henrene Dodge, MD;  Location: [facility] OR[facility];  Service: General;  Laterality: N/A;    Social History Social History   Tobacco Use   Smoking status: Never   Smokeless tobacco: Never  Vaping Use   Vaping status: Never Used  Substance Use Topics   Alcohol use: Never   Drug use: Never    Family History Family History  Problem Relation Age of Onset   Breast cancer Neg Hx     Allergies  Allergen Reactions   Ace Inhibitors Anaphylaxis   Lisinopril Anaphylaxis   Lipitor [Atorvastatin] Other (See Comments)    Muscle aches     REVIEW OF SYSTEMS (Negative unless checked)  Constitutional: [] Weight loss  [] Fever  [] Chills Cardiac: [] Chest pain   [] Chest pressure   [] Palpitations   []   Shortness of breath when laying flat   [] Shortness of breath with exertion. Vascular:  [] Pain in legs with walking   [] Pain in legs at rest  [] History of DVT   [] Phlebitis   [] Swelling in legs   [] Varicose veins   [] Non-healing ulcers Pulmonary:   [] Uses home oxygen   [] Productive cough   [] Hemoptysis   [] Wheeze  [] COPD   [x] Asthma Neurologic:  [] Dizziness   [] Seizures   [] History of stroke   [] History of TIA  [] Aphasia   [] Vissual changes   [] Weakness or numbness in arm   [] Weakness or numbness in leg Musculoskeletal:   [] Joint swelling   [] Joint pain   [] Low back pain Hematologic:  [] Easy bruising  [] Easy bleeding    [] Hypercoagulable state   [x] Anemic Gastrointestinal:  [] Diarrhea   [] Vomiting  [x] Gastroesophageal reflux/heartburn   [] Difficulty swallowing. Genitourinary:  [x] Chronic kidney disease   [] Difficult urination  [] Frequent urination   [] Blood in urine Skin:  [] Rashes   [] Ulcers  Psychological:  [] History of anxiety   []  History of major depression.  Physical Examination  There were no vitals filed for this visit. There is no height or weight on file to calculate BMI. Gen: WD/WN, NAD Head: [place]/AT, No temporalis wasting.  Ear/Nose/Throat: Hearing grossly intact, nares w/o erythema or drainage Eyes: PER, EOMI, sclera nonicteric.  Neck: Supple, no gross masses or lesions.  No JVD.  Pulmonary:  Good air movement, no audible wheezing, no use of accessory muscles.  Cardiac: RRR, precordium non-hyperdynamic. Vascular:   *** Vessel Right Left  Radial Palpable Palpable  Brachial Palpable Palpable  Gastrointestinal: soft, non-distended. No guarding/no peritoneal signs.  Musculoskeletal: M/S 5/5 throughout.  No deformity.  Neurologic: CN 2-12 intact. Pain and light touch intact in extremities.  Symmetrical.  Speech is fluent. Motor exam as listed above. Psychiatric: Judgment intact, Mood & affect appropriate for pt's clinical situation. Dermatologic: No rashes or ulcers noted.  No changes consistent with cellulitis.   CBC Lab Results  Component Value Date   WBC 5.8 10/23/2023   HGB 13.6 10/23/2023   HCT 41.3 10/23/2023   MCV 86.9 10/23/2023   PLT 242 10/23/2023    BMET    Component Value Date/Time   NA 135 10/23/2023 1354   NA 133 (L) 04/22/2013 1549   K 2.5 (LL) 10/23/2023 1354   K 3.7 04/22/2013 1549   CL 96 (L) 10/23/2023 1354   CL 100 04/22/2013 1549   CO2 24 10/23/2023 1354   CO2 22 04/22/2013 1549   GLUCOSE 146 (H) 10/23/2023 1354   GLUCOSE 386 (H) 04/22/2013 1549   BUN 20 10/23/2023 1354   BUN 14 04/22/2013 1549   CREATININE 3.03 (H) 10/23/2023 1354   CREATININE 1.08  04/22/2013 1549   CALCIUM 9.0 10/23/2023 1354   CALCIUM 9.4 04/22/2013 1549   GFRNONAA 17 (L) 10/23/2023 1354   GFRNONAA 59 (L) 04/22/2013 1549   GFRAA 26 (L) 02/14/2020 0435   GFRAA >60 04/22/2013 1549   CrCl cannot be calculated (Patient's most recent lab result is older than the maximum 21 days allowed.).  COAG No results found for: "INR", "PROTIME"  Radiology No results found.   Assessment/Plan There are no diagnoses linked to this encounter.   [name], MD  01/14/2024 9:57 AM

## 2024-01-16 ENCOUNTER — Ambulatory Visit (INDEPENDENT_AMBULATORY_CARE_PROVIDER_SITE_OTHER): Payer: Medicare Other | Admitting: Vascular Surgery

## 2024-01-16 ENCOUNTER — Ambulatory Visit (INDEPENDENT_AMBULATORY_CARE_PROVIDER_SITE_OTHER): Payer: Medicare Other

## 2024-01-16 DIAGNOSIS — N186 End stage renal disease: Secondary | ICD-10-CM

## 2024-01-16 DIAGNOSIS — I10 Essential (primary) hypertension: Secondary | ICD-10-CM

## 2024-01-16 DIAGNOSIS — J4520 Mild intermittent asthma, uncomplicated: Secondary | ICD-10-CM

## 2024-01-16 DIAGNOSIS — E119 Type 2 diabetes mellitus without complications: Secondary | ICD-10-CM

## 2024-01-16 DIAGNOSIS — K219 Gastro-esophageal reflux disease without esophagitis: Secondary | ICD-10-CM

## 2024-01-17 ENCOUNTER — Telehealth (INDEPENDENT_AMBULATORY_CARE_PROVIDER_SITE_OTHER): Payer: Self-pay

## 2024-01-17 NOTE — Telephone Encounter (Signed)
Spoke with the patient and she is scheduled with Dr. Gilda Crease for a left arm fistulagram with possible intervention at the [facility]. Arrival time is 11:30 am. Pre-procedure instructions were discussed an will be sent to Mychart and mailed.

## 2024-01-28 ENCOUNTER — Encounter: Payer: Self-pay | Admitting: Vascular Surgery

## 2024-01-28 ENCOUNTER — Other Ambulatory Visit: Payer: Self-pay

## 2024-01-28 ENCOUNTER — Ambulatory Visit
Admission: RE | Admit: 2024-01-28 | Discharge: 2024-01-28 | Disposition: A | Discharge status: Home / Self Care | Payer: Medicare Other | Attending: Vascular Surgery | Admitting: Vascular Surgery

## 2024-01-28 ENCOUNTER — Telehealth (INDEPENDENT_AMBULATORY_CARE_PROVIDER_SITE_OTHER): Payer: Self-pay | Admitting: Vascular Surgery

## 2024-01-28 ENCOUNTER — Encounter
Admission: RE | Disposition: A | Discharge status: Home / Self Care | Payer: Self-pay | Source: Home / Self Care | Attending: Vascular Surgery

## 2024-01-28 DIAGNOSIS — E1122 Type 2 diabetes mellitus with diabetic chronic kidney disease: Secondary | ICD-10-CM | POA: Insufficient documentation

## 2024-01-28 DIAGNOSIS — I132 Hypertensive heart and chronic kidney disease with heart failure and with stage 5 chronic kidney disease, or end stage renal disease: Secondary | ICD-10-CM | POA: Diagnosis not present

## 2024-01-28 DIAGNOSIS — N186 End stage renal disease: Secondary | ICD-10-CM

## 2024-01-28 DIAGNOSIS — T82898A Other specified complication of vascular prosthetic devices, implants and grafts, initial encounter: Secondary | ICD-10-CM | POA: Diagnosis present

## 2024-01-28 DIAGNOSIS — Z992 Dependence on renal dialysis: Secondary | ICD-10-CM | POA: Diagnosis not present

## 2024-01-28 DIAGNOSIS — Y832 Surgical operation with anastomosis, bypass or graft as the cause of abnormal reaction of the patient, or of later complication, without mention of misadventure at the time of the procedure: Secondary | ICD-10-CM | POA: Insufficient documentation

## 2024-01-28 DIAGNOSIS — Z7984 Long term (current) use of oral hypoglycemic drugs: Secondary | ICD-10-CM | POA: Diagnosis not present

## 2024-01-28 DIAGNOSIS — T82858A Stenosis of vascular prosthetic devices, implants and grafts, initial encounter: Secondary | ICD-10-CM | POA: Diagnosis not present

## 2024-01-28 DIAGNOSIS — J45909 Unspecified asthma, uncomplicated: Secondary | ICD-10-CM | POA: Insufficient documentation

## 2024-01-28 DIAGNOSIS — I5032 Chronic diastolic (congestive) heart failure: Secondary | ICD-10-CM | POA: Diagnosis not present

## 2024-01-28 HISTORY — PX: A/V FISTULAGRAM: CATH118298

## 2024-01-28 LAB — GLUCOSE, CAPILLARY: Glucose-Capillary: 124 mg/dL — ABNORMAL HIGH (ref 70–99)

## 2024-01-28 LAB — POTASSIUM (ARMC VASCULAR LAB ONLY): Potassium (ARMC vascular lab): 3 mmol/L — ABNORMAL LOW (ref 3.5–5.1)

## 2024-01-28 SURGERY — A/V FISTULAGRAM
Anesthesia: Moderate Sedation | Laterality: Left

## 2024-01-28 MED ORDER — SODIUM CHLORIDE 0.9 % IV SOLN: INTRAVENOUS | Status: DC

## 2024-01-28 MED ORDER — HEPARIN SODIUM (PORCINE) 1000 UNIT/ML IJ SOLN
INTRAMUSCULAR | Status: AC
  Filled 2024-01-28: qty 10

## 2024-01-28 MED ORDER — IODIXANOL 320 MG/ML IV SOLN
INTRAVENOUS | Status: DC | PRN
  Administered 2024-01-28: 20 mL

## 2024-01-28 MED ORDER — FENTANYL CITRATE PF 50 MCG/ML IJ SOSY
PREFILLED_SYRINGE | INTRAMUSCULAR | Status: AC
  Filled 2024-01-28: qty 1

## 2024-01-28 MED ORDER — MIDAZOLAM HCL 2 MG/2ML IJ SOLN
INTRAMUSCULAR | Status: AC
  Filled 2024-01-28: qty 2

## 2024-01-28 MED ORDER — LIDOCAINE HCL (PF) 1 % IJ SOLN
INTRAMUSCULAR | Status: DC | PRN
  Administered 2024-01-28: 10 mL via INTRADERMAL

## 2024-01-28 MED ORDER — DIPHENHYDRAMINE HCL 50 MG/ML IJ SOLN: 50.0000 mg | Freq: Once | INTRAMUSCULAR | Status: DC | PRN

## 2024-01-28 MED ORDER — ONDANSETRON HCL 4 MG/2ML IJ SOLN: 4.0000 mg | Freq: Four times a day (QID) | INTRAMUSCULAR | Status: DC | PRN

## 2024-01-28 MED ORDER — FENTANYL CITRATE (PF) 100 MCG/2ML IJ SOLN
INTRAMUSCULAR | Status: DC | PRN
  Administered 2024-01-28: 25 ug via INTRAVENOUS
  Administered 2024-01-28: 50 ug via INTRAVENOUS

## 2024-01-28 MED ORDER — HEPARIN (PORCINE) IN NACL 1000-0.9 UT/500ML-% IV SOLN
INTRAVENOUS | Status: DC | PRN
  Administered 2024-01-28: 500 mL

## 2024-01-28 MED ORDER — HEPARIN SODIUM (PORCINE) 1000 UNIT/ML IJ SOLN
INTRAMUSCULAR | Status: DC | PRN
  Administered 2024-01-28: 4000 [IU] via INTRAVENOUS

## 2024-01-28 MED ORDER — CEFAZOLIN SODIUM-DEXTROSE 1-4 GM/50ML-% IV SOLN
1.0000 g | INTRAVENOUS | Status: AC
  Administered 2024-01-28: 1 g via INTRAVENOUS

## 2024-01-28 MED ORDER — METHYLPREDNISOLONE SODIUM SUCC 125 MG IJ SOLR: 125.0000 mg | Freq: Once | INTRAMUSCULAR | Status: DC | PRN

## 2024-01-28 MED ORDER — CEFAZOLIN SODIUM-DEXTROSE 1-4 GM/50ML-% IV SOLN
INTRAVENOUS | Status: AC
Start: 2024-01-28 — End: ?
  Filled 2024-01-28: qty 50

## 2024-01-28 MED ORDER — FAMOTIDINE 20 MG PO TABS: 40.0000 mg | ORAL_TABLET | Freq: Once | ORAL | Status: DC | PRN

## 2024-01-28 MED ORDER — MIDAZOLAM HCL 2 MG/2ML IJ SOLN
INTRAMUSCULAR | Status: DC | PRN
  Administered 2024-01-28: 2 mg via INTRAVENOUS
  Administered 2024-01-28: 1 mg via INTRAVENOUS

## 2024-01-28 MED ORDER — HYDROMORPHONE HCL 1 MG/ML IJ SOLN: 1.0000 mg | Freq: Once | INTRAMUSCULAR | Status: DC | PRN

## 2024-01-28 MED ORDER — MIDAZOLAM HCL 2 MG/ML PO SYRP: 8.0000 mg | ORAL_SOLUTION | Freq: Once | ORAL | Status: DC | PRN

## 2024-01-28 SURGICAL SUPPLY — 22 items
BALLN LUTONIX 018 4X60X130 (BALLOONS) ×1 IMPLANT
BALLN LUTONIX DCB 4X60X130 (BALLOONS) ×1 IMPLANT
BALLN ULTRASCORE 014 3X40X150 (BALLOONS) ×1 IMPLANT
BALLOON LUTONIX 018 4X60X130 (BALLOONS) IMPLANT
BALLOON LUTONIX DCB 4X60X130 (BALLOONS) IMPLANT
BALLOON ULTRSCRE 014 3X40X150 (BALLOONS) IMPLANT
CATH BEACON 5 .035 40 KMP TP (CATHETERS) IMPLANT
CATH MICROCATH PRGRT 2.8F 110 (CATHETERS) IMPLANT
COVER PROBE ULTRASOUND 5X96 (MISCELLANEOUS) IMPLANT
DEVICE PRESTO INFLATION (MISCELLANEOUS) IMPLANT
DRAPE BRACHIAL (DRAPES) IMPLANT
GOWN STRL REUS W/ TWL LRG LVL3 (GOWN DISPOSABLE) ×1 IMPLANT
GUIDEWIRE ANGLED .035 180CM (WIRE) IMPLANT
MICROCATH PROGREAT 2.8F 110 CM (CATHETERS) ×1 IMPLANT
NDL ENTRY 21GA 7CM ECHOTIP (NEEDLE) IMPLANT
NEEDLE ENTRY 21GA 7CM ECHOTIP (NEEDLE) ×1 IMPLANT
PACK ANGIOGRAPHY (CUSTOM PROCEDURE TRAY) ×1 IMPLANT
SET INTRO CAPELLA COAXIAL (SET/KITS/TRAYS/PACK) IMPLANT
SHEATH BRITE TIP 6FRX5.5 (SHEATH) IMPLANT
SUT MNCRL AB 4-0 PS2 18 (SUTURE) IMPLANT
WIRE NITINOL .018 (WIRE) IMPLANT
WIRE RUNTHROUGH .014X300CM (WIRE) IMPLANT

## 2024-01-28 NOTE — Telephone Encounter (Signed)
LVM for patient to call [facility] to schedule 1 month fu

## 2024-01-28 NOTE — Op Note (Signed)
OPERATIVE NOTE   PROCEDURE: Contrast injection left radiocephalic AV access Percutaneous transluminal angioplasty of the anastomosis left radiocephalic fistula to 4 mm  PRE-OPERATIVE DIAGNOSIS: Complication of dialysis access                                                       End Stage Renal Disease  POST-OPERATIVE DIAGNOSIS: same as above   SURGEON: Renford Dills, M.D.  ANESTHESIA: Conscious sedation was administered under my direct supervision by the interventional radiology RN. IV Versed plus fentanyl were utilized. Continuous ECG, pulse oximetry and blood pressure was monitored throughout the entire procedure.  Conscious sedation was for a total of 46 minutes.  ESTIMATED BLOOD LOSS: minimal  FINDING(S): Stricture of the AV graft  SPECIMEN(S):  None  CONTRAST: 20 cc  FLUOROSCOPY TIME: 2.8 minutes  INDICATIONS: Brianna [name] is a 63 y.o. female who  presents with malfunctioning left wrist AV access.  The patient is scheduled for angiography with possible intervention of the AV access.  The patient is aware the risks include but are not limited to: bleeding, infection, thrombosis of the cannulated access, and possible anaphylactic reaction to the contrast.  The patient acknowledges if the access can not be salvaged a tunneled catheter will be needed and will be placed during this procedure.  The patient is aware of the risks of the procedure and elects to proceed with the angiogram and intervention.  DESCRIPTION: After full informed written consent was obtained, the patient was brought back to the Special Procedure suite and placed supine position.  Appropriate cardiopulmonary monitors were placed.  The left arm and wrist was prepped and draped in the standard fashion.  Appropriate timeout is called. The left radiocephalic fistula was cannulated with a micropuncture needle in the retrograde direction.  The insertion site was just distal to the antecubital crease  cannulation was performed with ultrasound guidance. Ultrasound was placed in a sterile sleeve, the AV access was interrogated and noted to be echolucent and compressible indicating patency. Image was recorded for the permanent record. The puncture is performed under continuous ultrasound visualization.   The microwire was advanced and the needle was exchanged for  a microsheath.  The J-wire was then advanced and a 6 Fr sheath inserted.  Floppy Glidewire and a Kumpe catheter were then negotiated into the brachial artery and hand injections were completed to image the access from the proximal arterial inflow through the arterial anastomosis through the entire access.    Based on the images,  4000 units of heparin was given and a 0.014 wire was advanced through the Kumpe catheter beyond the stricture within the radial artery.  An 3 mm by 40 mm ultra score balloon was used.  Inflation was to 10 atm for approximately 1 minute.  Next a 4 mm Lutonix drug-eluting balloon was advanced across the anastomosis into the radial artery for a distance of approximately 20 mm proximal to the anastomosis and inflation was performed to 4 atm for 1-1/2 minutes.  A prograde catheter was then advanced over the 0.014 wire into the mid radial artery and hand-injection of contrast was performed through the prograde catheter  Follow-up imaging demonstrates complete resolution of the stricture with less than 10% residual stenosis at the level of the anastomosis within the radial artery and now with rapid flow  of contrast through the fistula.  A 4-0 Monocryl purse-string suture was sewn around the sheath.  The sheath was removed and light pressure was applied.  A sterile bandage was applied to the puncture site.    COMPLICATIONS: None  CONDITION: Brianna [name], M.D [facility] Office: [phone]  01/28/2024 2:50 PM

## 2024-01-28 NOTE — Interval H&P Note (Signed)
History and Physical Interval Note:  01/28/2024 12:47 PM  Brianna [name]  has presented today for surgery, with the diagnosis of L arm fistulagram w possible intervention    ESR.  The various methods of treatment have been discussed with the patient and family. After consideration of risks, benefits and other options for treatment, the patient has consented to  Procedure(s): A/V Fistulagram (Left) as a surgical intervention.  The patient's history has been reviewed, patient examined, no change in status, stable for surgery.  I have reviewed the patient's chart and labs.  Questions were answered to the patient's satisfaction.     [name]

## 2024-01-28 NOTE — H&P (View-Only) (Signed)
MRN : [id number]  Brianna [name] is a 63 y.o. (1961/02/03) female who presents with chief complaint of check access.  History of Present Illness:   Patient presents to [facility] for evaluation of her fistula.  She was last seen in the office 2 months ago at which time her fistula was not maturing well and her flow volumes were only 700.  After discussion we decided to reevaluate after approximately 2 months and if not a readily accessible fistula then angiography with hope for intervention will be performed.  We are moving forward with the plan.  Current Meds  Medication Sig   albuterol (PROVENTIL) (2.5 MG/3ML) 0.083% nebulizer solution Take 2.5 mg by nebulization every 4 (four) hours as needed for shortness of breath or wheezing.   albuterol (VENTOLIN HFA) 108 (90 Base) MCG/ACT inhaler Inhale 1-2 puffs into the lungs every 6 (six) hours as needed for wheezing or shortness of breath.   allopurinol (ZYLOPRIM) 300 MG tablet Take 300 mg by mouth every morning.   ASPIRIN ADULT LOW STRENGTH 81 MG EC tablet Take 81 mg by mouth daily.   carvedilol (COREG) 12.5 MG tablet Take 1 tablet (12.5 mg total) by mouth 2 (two) times daily before a meal.   cetirizine (ZYRTEC) 10 MG tablet Take 10 mg by mouth every morning.   cyanocobalamin (VITAMIN B12) 1000 MCG tablet Take 1 tablet (1,000 mcg total) by mouth daily.   FEROSUL 325 (65 Fe) MG tablet Take 325 mg by mouth every other day.   furosemide (LASIX) 80 MG tablet Take 1 tablet (80 mg total) by mouth daily. For leg swelling or weight gain >5lbs (Patient taking differently: Take 80 mg by mouth every morning. For leg swelling or weight gain >5lbs)   hydrALAZINE (APRESOLINE) 50 MG tablet Take 1 tablet (50 mg total) by mouth every 8 (eight) hours.   isosorbide mononitrate (IMDUR) 30 MG 24 hr tablet Take 1 tablet (30 mg total) by mouth 2 (two) times daily.   JANUVIA 100 MG tablet Take 100 mg by mouth every  morning.   omeprazole (PRILOSEC) 20 MG capsule Take 20 mg by mouth every morning.   polyethylene glycol (MIRALAX) 17 g packet Take 17 g by mouth daily. (Patient taking differently: Take 17 g by mouth daily as needed.)   triamcinolone ointment (KENALOG) 0.5 % Apply 1 Application topically 2 (two) times daily.   ZETIA 10 MG tablet Take 10 mg by mouth every morning.   zolpidem (AMBIEN) 5 MG tablet Take 1 tablet (5 mg total) by mouth at bedtime as needed for sleep.    Past Medical History:  Diagnosis Date   Acid reflux    Anasarca    Anemia    Asthma    well controlled   Bilateral leg edema    Bronchopneumonia    Chronic heart failure with preserved ejection fraction (HFpEF) (HCC)    a. 12/2020 Echo: EF 70-75%, no rwma, mild LVH, GrI DD, nl RV fxn, triv MR/AI.   CKD (chronic kidney disease), stage IV (HCC)    DM (diabetes mellitus), type 2 (HCC)    Dyspnea    Hypertension    Hypokalemia    Morbid obesity (HCC)    Strangulated ventral hernia     Past Surgical History:  Procedure Laterality Date   AV FISTULA PLACEMENT Left 08/08/2023   Procedure: ARTERIOVENOUS (AV) FISTULA  CREATION (RADIALCEPHALIC);  Surgeon: Annice Needy, MD;  Location: [facility] ORS;  Service: Vascular;  Laterality: Left;  regional with MAC   BOWEL RESECTION  02/09/2020   Procedure: SMALL BOWEL RESECTION;  Surgeon: Henrene Dodge, MD;  Location: [facility] ORS;  Service: General;;   DIALYSIS/PERMA CATHETER INSERTION N/A 05/28/2023   Procedure: DIALYSIS/PERMA CATHETER INSERTION;  Surgeon: Renford Dills, MD;  Location: [facility];  Service: Cardiovascular;  Laterality: N/A;   LAPAROTOMY N/A 02/09/2020   Procedure: EXPLORATORY LAPAROTOMY;  Surgeon: Henrene Dodge, MD;  Location: [facility] ORS;  Service: General;  Laterality: N/A;    Social History Social History   Tobacco Use   Smoking status: Never   Smokeless tobacco: Never  Vaping Use   Vaping status: Never Used  Substance Use Topics   Alcohol use: Never   Drug  use: Never    Family History Family History  Problem Relation Age of Onset   Breast cancer Neg Hx     Allergies  Allergen Reactions   Ace Inhibitors Anaphylaxis   Lisinopril Anaphylaxis   Lipitor [Atorvastatin] Other (See Comments)    Muscle aches     REVIEW OF SYSTEMS (Negative unless checked)  Constitutional: [] Weight loss  [] Fever  [] Chills Cardiac: [] Chest pain   [] Chest pressure   [] Palpitations   [] Shortness of breath when laying flat   [] Shortness of breath with exertion. Vascular:  [] Pain in legs with walking   [] Pain in legs at rest  [] History of DVT   [] Phlebitis   [] Swelling in legs   [] Varicose veins   [] Non-healing ulcers Pulmonary:   [] Uses home oxygen   [] Productive cough   [] Hemoptysis   [] Wheeze  [] COPD   [] Asthma Neurologic:  [] Dizziness   [] Seizures   [] History of stroke   [] History of TIA  [] Aphasia   [] Vissual changes   [] Weakness or numbness in arm   [] Weakness or numbness in leg Musculoskeletal:   [] Joint swelling   [] Joint pain   [] Low back pain Hematologic:  [] Easy bruising  [] Easy bleeding   [] Hypercoagulable state   [] Anemic Gastrointestinal:  [] Diarrhea   [] Vomiting  [] Gastroesophageal reflux/heartburn   [] Difficulty swallowing. Genitourinary:  [x] Chronic kidney disease   [] Difficult urination  [] Frequent urination   [] Blood in urine Skin:  [] Rashes   [] Ulcers  Psychological:  [] History of anxiety   []  History of major depression.  Physical Examination  Vitals:   01/28/24 1110  BP: (!) 120/59  Pulse: 84  Resp: 20  Temp: 98.3 F (36.8 C)  TempSrc: Oral  SpO2: 95%   There is no height or weight on file to calculate BMI. Gen: WD/WN, NAD Head: [place]/AT, No temporalis wasting.  Ear/Nose/Throat: Hearing grossly intact, nares w/o erythema or drainage Eyes: PER, EOMI, sclera nonicteric.  Neck: Supple, no gross masses or lesions.  No JVD.  Pulmonary:  Good air movement, no audible wheezing, no use of accessory muscles.  Cardiac: RRR, precordium  non-hyperdynamic. Vascular:   Weak thrill weak bruit Vessel Right Left  Radial Palpable Palpable  Brachial Palpable Palpable  Gastrointestinal: soft, non-distended. No guarding/no peritoneal signs.  Musculoskeletal: M/S 5/5 throughout.  No deformity.  Neurologic: CN 2-12 intact. Pain and light touch intact in extremities.  Symmetrical.  Speech is fluent. Motor exam as listed above. Psychiatric: Judgment intact, Mood & affect appropriate for pt's clinical situation. Dermatologic: No rashes or ulcers noted.  No changes consistent with cellulitis.   CBC Lab Results  Component Value Date   WBC 5.8 10/23/2023  HGB 13.6 10/23/2023   HCT 41.3 10/23/2023   MCV 86.9 10/23/2023   PLT 242 10/23/2023    BMET    Component Value Date/Time   NA 135 10/23/2023 1354   NA 133 (L) 04/22/2013 1549   K 2.5 (LL) 10/23/2023 1354   K 3.7 04/22/2013 1549   CL 96 (L) 10/23/2023 1354   CL 100 04/22/2013 1549   CO2 24 10/23/2023 1354   CO2 22 04/22/2013 1549   GLUCOSE 146 (H) 10/23/2023 1354   GLUCOSE 386 (H) 04/22/2013 1549   BUN 20 10/23/2023 1354   BUN 14 04/22/2013 1549   CREATININE 3.03 (H) 10/23/2023 1354   CREATININE 1.08 04/22/2013 1549   CALCIUM 9.0 10/23/2023 1354   CALCIUM 9.4 04/22/2013 1549   GFRNONAA 17 (L) 10/23/2023 1354   GFRNONAA 59 (L) 04/22/2013 1549   GFRAA 26 (L) 02/14/2020 0435   GFRAA >60 04/22/2013 1549   CrCl cannot be calculated (Patient's most recent lab result is older than the maximum 21 days allowed.).  COAG No results found for: "INR", "PROTIME"  Radiology VAS US DUPLEX DIALYSIS ACCESS (AVF, AVG) Result Date: 01/17/2024 DIALYSIS ACCESS Patient Name:  Brianna [name]  Date of Exam:   01/16/2024 Medical Rec #: [id number]            Accession #:    [id number] Date of Birth: 11-Apr-1961            Patient Gender: F Patient Age:   47 years Exam Location:  [facility] Procedure:      VAS US DUPLEX DIALYSIS ACCESS (AVF, AVG) Referring Phys: [name] --------------------------------------------------------------------------------  Reason for Exam: Non-maturation of AVF. Access Site: Left Upper Extremity. Access Type: Radial-cephalic AVF. History: 08/08/2023: Left Radiocephalic AVF placement. Performing Technologist: Salvadore Farber RVT  Examination Guidelines: A complete evaluation includes B-mode imaging, spectral Doppler, color Doppler, and power Doppler as needed of all accessible portions of each vessel. Unilateral testing is considered an integral part of a complete examination. Limited examinations for reoccurring indications may be performed as noted.  Findings: +--------------------+----------+-----------------+--------+ AVF                 PSV (cm/s)Flow Vol (mL/min)Comments +--------------------+----------+-----------------+--------+ Native artery inflow   303           495                +--------------------+----------+-----------------+--------+ AVF Anastomosis        355                              +--------------------+----------+-----------------+--------+  +------------+----------+-------------+----------+--------+ OUTFLOW VEINPSV (cm/s)Diameter (cm)Depth (cm)Describe +------------+----------+-------------+----------+--------+ Confluence      88                                    +------------+----------+-------------+----------+--------+ Mid UA          69                                    +------------+----------+-------------+----------+--------+ Dist UA         60                                    +------------+----------+-------------+----------+--------+ [facility]  Fossa        71                                    +------------+----------+-------------+----------+--------+ Prox Forearm    92        0.37                        +------------+----------+-------------+----------+--------+ Mid Forearm     88        0.43                         +------------+----------+-------------+----------+--------+ Dist Forearm    73        0.43                        +------------+----------+-------------+----------+--------+   Summary: Patent AVF with low flow volume. Moderate atherosclerosis in the distal radial artery.  *See table(s) above for measurements and observations.  Diagnosing physician: [name] MD Electronically signed by [name] MD on 01/17/2024 at 10:41:19 AM.   --------------------------------------------------------------------------------   Final      Assessment/Plan 1. End stage renal disease (HCC) Recommend:  The patient is experiencing problems with their dialysis access.  Her fistula has failed to mature and she is still requiring the tunnel catheter.  Patient should have a fistulagram with the intention for intervention.  The intention for intervention is to restore appropriate flow and prevent thrombosis and possible loss of the access.  As well as improve the quality of dialysis therapy.  The risks, benefits and alternative therapies were reviewed in detail with the patient.  All questions were answered.  The patient agrees to proceed with angio/intervention.    The patient will follow up with me in the office after the procedure.    2. Primary hypertension Continue antihypertensive medications as already ordered, these medications have been reviewed and there are no changes at this time.   3. Intermittent asthma without complication, unspecified asthma severity Continue pulmonary medications and aerosols as already ordered, these medications have been reviewed and there are no changes at this time.     4. Type 2 diabetes mellitus without complication, with long-term current use of insulin (HCC) Continue hypoglycemic medications as already ordered, these medications have been reviewed and there are no changes at this time.   Hgb A1C to be monitored as already arranged by primary  service   [name], MD  01/28/2024 12:45 PM

## 2024-01-28 NOTE — Progress Notes (Signed)
MRN : [id number]  Brianna [name] is a 63 y.o. (1961/02/03) female who presents with chief complaint of check access.  History of Present Illness:   Patient presents to [facility] for evaluation of her fistula.  She was last seen in the office 2 months ago at which time her fistula was not maturing well and her flow volumes were only 700.  After discussion we decided to reevaluate after approximately 2 months and if not a readily accessible fistula then angiography with hope for intervention will be performed.  We are moving forward with the plan.  Current Meds  Medication Sig   albuterol (PROVENTIL) (2.5 MG/3ML) 0.083% nebulizer solution Take 2.5 mg by nebulization every 4 (four) hours as needed for shortness of breath or wheezing.   albuterol (VENTOLIN HFA) 108 (90 Base) MCG/ACT inhaler Inhale 1-2 puffs into the lungs every 6 (six) hours as needed for wheezing or shortness of breath.   allopurinol (ZYLOPRIM) 300 MG tablet Take 300 mg by mouth every morning.   ASPIRIN ADULT LOW STRENGTH 81 MG EC tablet Take 81 mg by mouth daily.   carvedilol (COREG) 12.5 MG tablet Take 1 tablet (12.5 mg total) by mouth 2 (two) times daily before a meal.   cetirizine (ZYRTEC) 10 MG tablet Take 10 mg by mouth every morning.   cyanocobalamin (VITAMIN B12) 1000 MCG tablet Take 1 tablet (1,000 mcg total) by mouth daily.   FEROSUL 325 (65 Fe) MG tablet Take 325 mg by mouth every other day.   furosemide (LASIX) 80 MG tablet Take 1 tablet (80 mg total) by mouth daily. For leg swelling or weight gain >5lbs (Patient taking differently: Take 80 mg by mouth every morning. For leg swelling or weight gain >5lbs)   hydrALAZINE (APRESOLINE) 50 MG tablet Take 1 tablet (50 mg total) by mouth every 8 (eight) hours.   isosorbide mononitrate (IMDUR) 30 MG 24 hr tablet Take 1 tablet (30 mg total) by mouth 2 (two) times daily.   JANUVIA 100 MG tablet Take 100 mg by mouth every  morning.   omeprazole (PRILOSEC) 20 MG capsule Take 20 mg by mouth every morning.   polyethylene glycol (MIRALAX) 17 g packet Take 17 g by mouth daily. (Patient taking differently: Take 17 g by mouth daily as needed.)   triamcinolone ointment (KENALOG) 0.5 % Apply 1 Application topically 2 (two) times daily.   ZETIA 10 MG tablet Take 10 mg by mouth every morning.   zolpidem (AMBIEN) 5 MG tablet Take 1 tablet (5 mg total) by mouth at bedtime as needed for sleep.    Past Medical History:  Diagnosis Date   Acid reflux    Anasarca    Anemia    Asthma    well controlled   Bilateral leg edema    Bronchopneumonia    Chronic heart failure with preserved ejection fraction (HFpEF) (HCC)    a. 12/2020 Echo: EF 70-75%, no rwma, mild LVH, GrI DD, nl RV fxn, triv MR/AI.   CKD (chronic kidney disease), stage IV (HCC)    DM (diabetes mellitus), type 2 (HCC)    Dyspnea    Hypertension    Hypokalemia    Morbid obesity (HCC)    Strangulated ventral hernia     Past Surgical History:  Procedure Laterality Date   AV FISTULA PLACEMENT Left 08/08/2023   Procedure: ARTERIOVENOUS (AV) FISTULA  CREATION (RADIALCEPHALIC);  Surgeon: Annice Needy, MD;  Location: [facility];  Service: Vascular;  Laterality: Left;  regional with MAC   BOWEL RESECTION  02/09/2020   Procedure: SMALL BOWEL RESECTION;  Surgeon: Henrene Dodge, MD;  Location: [facility];  Service: General;;   DIALYSIS/PERMA CATHETER INSERTION N/A 05/28/2023   Procedure: DIALYSIS/PERMA CATHETER INSERTION;  Surgeon: Renford Dills, MD;  Location: [facility];  Service: Cardiovascular;  Laterality: N/A;   LAPAROTOMY N/A 02/09/2020   Procedure: EXPLORATORY LAPAROTOMY;  Surgeon: Henrene Dodge, MD;  Location: [facility];  Service: General;  Laterality: N/A;    Social History Social History   Tobacco Use   Smoking status: Never   Smokeless tobacco: Never  Vaping Use   Vaping status: Never Used  Substance Use Topics   Alcohol use: Never   Drug  use: Never    Family History Family History  Problem Relation Age of Onset   Breast cancer Neg Hx     Allergies  Allergen Reactions   Ace Inhibitors Anaphylaxis   Lisinopril Anaphylaxis   Lipitor [Atorvastatin] Other (See Comments)    Muscle aches     REVIEW OF SYSTEMS (Negative unless checked)  Constitutional: [] Weight loss  [] Fever  [] Chills Cardiac: [] Chest pain   [] Chest pressure   [] Palpitations   [] Shortness of breath when laying flat   [] Shortness of breath with exertion. Vascular:  [] Pain in legs with walking   [] Pain in legs at rest  [] History of DVT   [] Phlebitis   [] Swelling in legs   [] Varicose veins   [] Non-healing ulcers Pulmonary:   [] Uses home oxygen   [] Productive cough   [] Hemoptysis   [] Wheeze  [] COPD   [] Asthma Neurologic:  [] Dizziness   [] Seizures   [] History of stroke   [] History of TIA  [] Aphasia   [] Vissual changes   [] Weakness or numbness in arm   [] Weakness or numbness in leg Musculoskeletal:   [] Joint swelling   [] Joint pain   [] Low back pain Hematologic:  [] Easy bruising  [] Easy bleeding   [] Hypercoagulable state   [] Anemic Gastrointestinal:  [] Diarrhea   [] Vomiting  [] Gastroesophageal reflux/heartburn   [] Difficulty swallowing. Genitourinary:  [x] Chronic kidney disease   [] Difficult urination  [] Frequent urination   [] Blood in urine Skin:  [] Rashes   [] Ulcers  Psychological:  [] History of anxiety   []  History of major depression.  Physical Examination  Vitals:   01/28/24 1110  BP: (!) 120/59  Pulse: 84  Resp: 20  Temp: 98.3 F (36.8 C)  TempSrc: Oral  SpO2: 95%   There is no height or weight on file to calculate BMI. Gen: WD/WN, NAD Head: [place]/AT, No temporalis wasting.  Ear/Nose/Throat: Hearing grossly intact, nares w/o erythema or drainage Eyes: PER, EOMI, sclera nonicteric.  Neck: Supple, no gross masses or lesions.  No JVD.  Pulmonary:  Good air movement, no audible wheezing, no use of accessory muscles.  Cardiac: RRR, precordium  non-hyperdynamic. Vascular:   Weak thrill weak bruit Vessel Right Left  Radial Palpable Palpable  Brachial Palpable Palpable  Gastrointestinal: soft, non-distended. No guarding/no peritoneal signs.  Musculoskeletal: M/S 5/5 throughout.  No deformity.  Neurologic: CN 2-12 intact. Pain and light touch intact in extremities.  Symmetrical.  Speech is fluent. Motor exam as listed above. Psychiatric: Judgment intact, Mood & affect appropriate for pt's clinical situation. Dermatologic: No rashes or ulcers noted.  No changes consistent with cellulitis.   CBC Lab Results  Component Value Date   WBC 5.8 10/23/2023  HGB 13.6 10/23/2023   HCT 41.3 10/23/2023   MCV 86.9 10/23/2023   PLT 242 10/23/2023    BMET    Component Value Date/Time   NA 135 10/23/2023 1354   NA 133 (L) 04/22/2013 1549   K 2.5 (LL) 10/23/2023 1354   K 3.7 04/22/2013 1549   CL 96 (L) 10/23/2023 1354   CL 100 04/22/2013 1549   CO2 24 10/23/2023 1354   CO2 22 04/22/2013 1549   GLUCOSE 146 (H) 10/23/2023 1354   GLUCOSE 386 (H) 04/22/2013 1549   BUN 20 10/23/2023 1354   BUN 14 04/22/2013 1549   CREATININE 3.03 (H) 10/23/2023 1354   CREATININE 1.08 04/22/2013 1549   CALCIUM 9.0 10/23/2023 1354   CALCIUM 9.4 04/22/2013 1549   GFRNONAA 17 (L) 10/23/2023 1354   GFRNONAA 59 (L) 04/22/2013 1549   GFRAA 26 (L) 02/14/2020 0435   GFRAA >60 04/22/2013 1549   CrCl cannot be calculated (Patient's most recent lab result is older than the maximum 21 days allowed.).  COAG No results found for: "INR", "PROTIME"  Radiology VAS US DUPLEX DIALYSIS ACCESS (AVF, AVG) Result Date: 01/17/2024 DIALYSIS ACCESS Patient Name:  MAKYNLEE KRESSIN  Date of Exam:   01/16/2024 Medical Rec #: [id number]            Accession #:    [id number] Date of Birth: 11-Apr-1961            Patient Gender: F Patient Age:   47 years Exam Location:  [facility] Procedure:      VAS US DUPLEX DIALYSIS ACCESS (AVF, AVG) Referring Phys: [name] --------------------------------------------------------------------------------  Reason for Exam: Non-maturation of AVF. Access Site: Left Upper Extremity. Access Type: Radial-cephalic AVF. History: 08/08/2023: Left Radiocephalic AVF placement. Performing Technologist: Salvadore Farber RVT  Examination Guidelines: A complete evaluation includes B-mode imaging, spectral Doppler, color Doppler, and power Doppler as needed of all accessible portions of each vessel. Unilateral testing is considered an integral part of a complete examination. Limited examinations for reoccurring indications may be performed as noted.  Findings: +--------------------+----------+-----------------+--------+ AVF                 PSV (cm/s)Flow Vol (mL/min)Comments +--------------------+----------+-----------------+--------+ Native artery inflow   303           495                +--------------------+----------+-----------------+--------+ AVF Anastomosis        355                              +--------------------+----------+-----------------+--------+  +------------+----------+-------------+----------+--------+ OUTFLOW VEINPSV (cm/s)Diameter (cm)Depth (cm)Describe +------------+----------+-------------+----------+--------+ Confluence      88                                    +------------+----------+-------------+----------+--------+ Mid UA          69                                    +------------+----------+-------------+----------+--------+ Dist UA         60                                    +------------+----------+-------------+----------+--------+ [facility]  Fossa        71                                    +------------+----------+-------------+----------+--------+ Prox Forearm    92        0.37                        +------------+----------+-------------+----------+--------+ Mid Forearm     88        0.43                         +------------+----------+-------------+----------+--------+ Dist Forearm    73        0.43                        +------------+----------+-------------+----------+--------+   Summary: Patent AVF with low flow volume. Moderate atherosclerosis in the distal radial artery.  *See table(s) above for measurements and observations.  Diagnosing physician: [name] MD Electronically signed by [name] MD on 01/17/2024 at 10:41:19 AM.   --------------------------------------------------------------------------------   Final      Assessment/Plan 1. End stage renal disease (HCC) Recommend:  The patient is experiencing problems with their dialysis access.  Her fistula has failed to mature and she is still requiring the tunnel catheter.  Patient should have a fistulagram with the intention for intervention.  The intention for intervention is to restore appropriate flow and prevent thrombosis and possible loss of the access.  As well as improve the quality of dialysis therapy.  The risks, benefits and alternative therapies were reviewed in detail with the patient.  All questions were answered.  The patient agrees to proceed with angio/intervention.    The patient will follow up with me in the office after the procedure.    2. Primary hypertension Continue antihypertensive medications as already ordered, these medications have been reviewed and there are no changes at this time.   3. Intermittent asthma without complication, unspecified asthma severity Continue pulmonary medications and aerosols as already ordered, these medications have been reviewed and there are no changes at this time.     4. Type 2 diabetes mellitus without complication, with long-term current use of insulin (HCC) Continue hypoglycemic medications as already ordered, these medications have been reviewed and there are no changes at this time.   Hgb A1C to be monitored as already arranged by primary  service   [name], MD  01/28/2024 12:45 PM

## 2024-02-07 ENCOUNTER — Ambulatory Visit: Attending: Otolaryngology

## 2024-02-07 DIAGNOSIS — G4733 Obstructive sleep apnea (adult) (pediatric): Secondary | ICD-10-CM | POA: Insufficient documentation

## 2024-02-26 NOTE — Progress Notes (Signed)
 MRN : [id number]  Brianna [name] is a 63 y.o. (1961/10/08) female who presents with chief complaint of check access.  History of Present Illness:   The patient returns to the office for followup status post intervention of their dialysis access on 01/28/2024.   Procedure:  Percutaneous transluminal angioplasty of the anastomosis left radiocephalic fistula to 4 mm  Following the intervention the access function has significantly improved, with better flow rates and improved KT/V. The patient has not been experiencing increased bleeding times following decannulation and the patient denies increased recirculation. The patient denies an increase in arm swelling. At the present time the patient denies hand pain.  No recent shortening of the patient's walking distance or new symptoms consistent with claudication.  No history of rest pain symptoms. No new ulcers or wounds of the lower extremities have occurred.  The patient denies amaurosis fugax or recent TIA symptoms. There are no recent neurological changes noted. There is no history of DVT, PE or superficial thrombophlebitis. No recent episodes of angina or shortness of breath documented.     No outpatient medications have been marked as taking for the 02/27/24 encounter (Appointment) with Gilda Crease, Latina Craver, MD.    Past Medical History:  Diagnosis Date   Acid reflux    Anasarca    Anemia    Asthma    well controlled   Bilateral leg edema    Bronchopneumonia    Chronic heart failure with preserved ejection fraction (HFpEF) (HCC)    a. 12/2020 Echo: EF 70-75%, no rwma, mild LVH, GrI DD, nl RV fxn, triv MR/AI.   CKD (chronic kidney disease), stage IV (HCC)    DM (diabetes mellitus), type 2 (HCC)    Dyspnea    Hypertension    Hypokalemia    Morbid obesity (HCC)    Strangulated ventral hernia     Past Surgical History:  Procedure Laterality Date   A/V FISTULAGRAM Left 01/28/2024   Procedure: A/V  Fistulagram;  Surgeon: Renford Dills, MD;  Location: [facility];  Service: Cardiovascular;  Laterality: Left;   AV FISTULA PLACEMENT Left 08/08/2023   Procedure: ARTERIOVENOUS (AV) FISTULA CREATION (RADIALCEPHALIC);  Surgeon: Annice Needy, MD;  Location: [facility];  Service: Vascular;  Laterality: Left;  regional with MAC   BOWEL RESECTION  02/09/2020   Procedure: SMALL BOWEL RESECTION;  Surgeon: Henrene Dodge, MD;  Location: [facility];  Service: General;;   DIALYSIS/PERMA CATHETER INSERTION N/A 05/28/2023   Procedure: DIALYSIS/PERMA CATHETER INSERTION;  Surgeon: Renford Dills, MD;  Location: [facility];  Service: Cardiovascular;  Laterality: N/A;   LAPAROTOMY N/A 02/09/2020   Procedure: EXPLORATORY LAPAROTOMY;  Surgeon: Henrene Dodge, MD;  Location: [facility];  Service: General;  Laterality: N/A;    Social History Social History   Tobacco Use   Smoking status: Never   Smokeless tobacco: Never  Vaping Use   Vaping status: Never Used  Substance Use Topics   Alcohol use: Never   Drug use: Never    Family History Family History  Problem Relation Age of Onset   Breast cancer Neg Hx     Allergies  Allergen Reactions   Ace Inhibitors Anaphylaxis   Lisinopril Anaphylaxis   Lipitor [Atorvastatin] Other (See Comments)    Muscle aches     REVIEW OF SYSTEMS (Negative unless checked)  Constitutional: [] Weight loss  [] Fever  []   Chills Cardiac: [] Chest pain   [] Chest pressure   [] Palpitations   [] Shortness of breath when laying flat   [] Shortness of breath with exertion. Vascular:  [] Pain in legs with walking   [] Pain in legs at rest  [] History of DVT   [] Phlebitis   [] Swelling in legs   [] Varicose veins   [] Non-healing ulcers Pulmonary:   [] Uses home oxygen   [] Productive cough   [] Hemoptysis   [] Wheeze  [] COPD   [] Asthma Neurologic:  [] Dizziness   [] Seizures   [] History of stroke   [] History of TIA  [] Aphasia   [] Vissual changes   [] Weakness or numbness in arm    [] Weakness or numbness in leg Musculoskeletal:   [] Joint swelling   [] Joint pain   [] Low back pain Hematologic:  [] Easy bruising  [] Easy bleeding   [] Hypercoagulable state   [] Anemic Gastrointestinal:  [] Diarrhea   [] Vomiting  [] Gastroesophageal reflux/heartburn   [] Difficulty swallowing. Genitourinary:  [x] Chronic kidney disease   [] Difficult urination  [] Frequent urination   [] Blood in urine Skin:  [] Rashes   [] Ulcers  Psychological:  [] History of anxiety   []  History of major depression.  Physical Examination  There were no vitals filed for this visit. There is no height or weight on file to calculate BMI. Gen: WD/WN, NAD Head: [place]/AT, No temporalis wasting.  Ear/Nose/Throat: Hearing grossly intact, nares w/o erythema or drainage Eyes: PER, EOMI, sclera nonicteric.  Neck: Supple, no gross masses or lesions.  No JVD.  Pulmonary:  Good air movement, no audible wheezing, no use of accessory muscles.  Cardiac: RRR, precordium non-hyperdynamic. Vascular:   Good thrill good bruit Vessel Right Left  Radial Palpable Palpable  Brachial Palpable Palpable  Gastrointestinal: soft, non-distended. No guarding/no peritoneal signs.  Musculoskeletal: M/S 5/5 throughout.  No deformity.  Neurologic: CN 2-12 intact. Pain and light touch intact in extremities.  Symmetrical.  Speech is fluent. Motor exam as listed above. Psychiatric: Judgment intact, Mood & affect appropriate for pt's clinical situation. Dermatologic: No rashes or ulcers noted.  No changes consistent with cellulitis.   CBC Lab Results  Component Value Date   WBC 5.8 10/23/2023   HGB 13.6 10/23/2023   HCT 41.3 10/23/2023   MCV 86.9 10/23/2023   PLT 242 10/23/2023    BMET    Component Value Date/Time   NA 135 10/23/2023 1354   NA 133 (L) 04/22/2013 1549   K 2.5 (LL) 10/23/2023 1354   K 3.7 04/22/2013 1549   CL 96 (L) 10/23/2023 1354   CL 100 04/22/2013 1549   CO2 24 10/23/2023 1354   CO2 22 04/22/2013 1549   GLUCOSE 146  (H) 10/23/2023 1354   GLUCOSE 386 (H) 04/22/2013 1549   BUN 20 10/23/2023 1354   BUN 14 04/22/2013 1549   CREATININE 3.03 (H) 10/23/2023 1354   CREATININE 1.08 04/22/2013 1549   CALCIUM 9.0 10/23/2023 1354   CALCIUM 9.4 04/22/2013 1549   GFRNONAA 17 (L) 10/23/2023 1354   GFRNONAA 59 (L) 04/22/2013 1549   GFRAA 26 (L) 02/14/2020 0435   GFRAA >60 04/22/2013 1549   CrCl cannot be calculated (Patient's most recent lab result is older than the maximum 21 days allowed.).  COAG No results found for: "INR", "PROTIME"  Radiology PERIPHERAL VASCULAR CATHETERIZATION Result Date: 01/28/2024 See surgical note for result.    Assessment/Plan 1. End stage renal disease (HCC) (Primary) Recommend:  The patient is doing well and currently has adequate dialysis access.  I will have her access marked and they can begin  utilizing or accessing the fistula.  Flow pattern is stable when compared to the prior ultrasound.  The patient should have a duplex ultrasound of the dialysis access in 6 months. The patient will follow-up with me in the office after each ultrasound   - VAS US DUPLEX DIALYSIS ACCESS (AVF, AVG); Future  2. Primary hypertension Continue antihypertensive medications as already ordered, these medications have been reviewed and there are no changes at this time.  3. Intermittent asthma without complication, unspecified asthma severity Continue pulmonary medications and aerosols as already ordered, these medications have been reviewed and there are no changes at this time.   4. Type 2 diabetes mellitus without complication, with long-term current use of insulin (HCC) Continue hypoglycemic medications as already ordered, these medications have been reviewed and there are no changes at this time.  Hgb A1C to be monitored as already arranged by primary service d   [name], MD  02/26/2024 8:45 PM

## 2024-02-27 ENCOUNTER — Ambulatory Visit (INDEPENDENT_AMBULATORY_CARE_PROVIDER_SITE_OTHER): Payer: Medicare Other | Admitting: Vascular Surgery

## 2024-02-27 ENCOUNTER — Encounter (INDEPENDENT_AMBULATORY_CARE_PROVIDER_SITE_OTHER): Payer: Self-pay | Admitting: Vascular Surgery

## 2024-02-27 VITALS — BP 116/76 | HR 90 | Resp 18 | Ht <= 58 in | Wt 181.2 lb

## 2024-02-27 DIAGNOSIS — J4520 Mild intermittent asthma, uncomplicated: Secondary | ICD-10-CM | POA: Diagnosis not present

## 2024-02-27 DIAGNOSIS — N186 End stage renal disease: Secondary | ICD-10-CM

## 2024-02-27 DIAGNOSIS — E119 Type 2 diabetes mellitus without complications: Secondary | ICD-10-CM | POA: Diagnosis not present

## 2024-02-27 DIAGNOSIS — Z794 Long term (current) use of insulin: Secondary | ICD-10-CM

## 2024-02-27 DIAGNOSIS — I10 Essential (primary) hypertension: Secondary | ICD-10-CM | POA: Diagnosis not present

## 2024-03-01 ENCOUNTER — Encounter (INDEPENDENT_AMBULATORY_CARE_PROVIDER_SITE_OTHER): Payer: Self-pay | Admitting: Vascular Surgery

## 2024-03-05 ENCOUNTER — Telehealth (INDEPENDENT_AMBULATORY_CARE_PROVIDER_SITE_OTHER): Payer: Self-pay

## 2024-03-05 ENCOUNTER — Ambulatory Visit (INDEPENDENT_AMBULATORY_CARE_PROVIDER_SITE_OTHER): Payer: Medicare Other

## 2024-03-05 DIAGNOSIS — N186 End stage renal disease: Secondary | ICD-10-CM

## 2024-03-05 NOTE — Telephone Encounter (Addendum)
 Patient was seen today with ultrasound. Patient would like to know if dialysis can  use the access on tomorrow. Patient was notified that Dr Gilda Crease advise that dialysis access can be used for treatment.

## 2024-03-17 ENCOUNTER — Telehealth (INDEPENDENT_AMBULATORY_CARE_PROVIDER_SITE_OTHER): Payer: Self-pay | Admitting: Vascular Surgery

## 2024-03-17 NOTE — Telephone Encounter (Signed)
 We can get her in with an HDA

## 2024-03-17 NOTE — Telephone Encounter (Signed)
 Patient states left arm swelled up a week ago and won't go down. Wants to see GS. Not sure if US's needed. Please advise. Per patient can leave message on her voicemail.

## 2024-03-18 ENCOUNTER — Other Ambulatory Visit (INDEPENDENT_AMBULATORY_CARE_PROVIDER_SITE_OTHER): Payer: Self-pay | Admitting: Nurse Practitioner

## 2024-03-18 DIAGNOSIS — M7989 Other specified soft tissue disorders: Secondary | ICD-10-CM

## 2024-03-19 ENCOUNTER — Ambulatory Visit (INDEPENDENT_AMBULATORY_CARE_PROVIDER_SITE_OTHER)

## 2024-03-19 DIAGNOSIS — M7989 Other specified soft tissue disorders: Secondary | ICD-10-CM | POA: Diagnosis not present

## 2024-03-24 ENCOUNTER — Encounter (INDEPENDENT_AMBULATORY_CARE_PROVIDER_SITE_OTHER): Payer: Self-pay | Admitting: Nurse Practitioner

## 2024-03-24 ENCOUNTER — Ambulatory Visit (INDEPENDENT_AMBULATORY_CARE_PROVIDER_SITE_OTHER): Admitting: Nurse Practitioner

## 2024-03-24 VITALS — BP 122/75 | HR 91 | Resp 18 | Ht <= 58 in | Wt 177.0 lb

## 2024-03-24 DIAGNOSIS — N186 End stage renal disease: Secondary | ICD-10-CM | POA: Diagnosis not present

## 2024-03-24 DIAGNOSIS — M7989 Other specified soft tissue disorders: Secondary | ICD-10-CM

## 2024-03-24 DIAGNOSIS — I10 Essential (primary) hypertension: Secondary | ICD-10-CM

## 2024-03-24 NOTE — H&P (View-Only) (Signed)
 Subjective:    Patient ID: Brianna [name], female    DOB: 12/15/1961, 63 y.o.   MRN: [id number] No chief complaint on file.   The patient returns to the office for follow up regarding a problem with their dialysis access.   The patient notes that she had an episode where there was significant swelling and bruising in her left upper extremity with dialysis.  There is some notable left upper arm swelling and bruising but the patient notes that his improved from where it was previously.  She still has a PermCath in place and would even like this removed but there is concern with the function of her fistula before that can be done.  The patient denies redness or swelling at the access site. The patient denies fever or chills at home or while on dialysis.  No recent shortening of the patient's walking distance or new symptoms consistent with claudication.  No history of rest pain symptoms. No new ulcers or wounds of the lower extremities have occurred.  The patient denies amaurosis fugax or recent TIA symptoms. There are no recent neurological changes noted. There is no history of DVT, PE or superficial thrombophlebitis. No recent episodes of angina or shortness of breath documented.   Duplex ultrasound of the AV access shows a patent access.  The previously noted stenosis is significantly increased compared to last study.  Flow volume today is 502 cc/min (previous flow volume was 740 cc/min)    Review of Systems  Cardiovascular:        Swelling in left upper extremity  Hematological:  Bruises/bleeds easily.  All other systems reviewed and are negative.      Objective:   Physical Exam Vitals reviewed.  HENT:     Head: Normocephalic.  Cardiovascular:     Rate and Rhythm: Normal rate.     Pulses:          Radial pulses are 2+ on the left side.     Arteriovenous access: Left arteriovenous access is present.    Comments: Good thrill and bruit Pulmonary:     Effort: Pulmonary  effort is normal.  Skin:    General: Skin is warm and dry.  Neurological:     Mental Status: She is alert and oriented to person, place, and time.  Psychiatric:        Mood and Affect: Mood normal.        Behavior: Behavior normal.        Thought Content: Thought content normal.        Judgment: Judgment normal.     There were no vitals taken for this visit.  Past Medical History:  Diagnosis Date   Acid reflux    Anasarca    Anemia    Asthma    well controlled   Bilateral leg edema    Bronchopneumonia    Chronic heart failure with preserved ejection fraction (HFpEF) (HCC)    a. 12/2020 Echo: EF 70-75%, no rwma, mild LVH, GrI DD, nl RV fxn, triv MR/AI.   CKD (chronic kidney disease), stage IV (HCC)    DM (diabetes mellitus), type 2 (HCC)    Dyspnea    Hypertension    Hypokalemia    Morbid obesity (HCC)    Strangulated ventral hernia     Social History   Socioeconomic History   Marital status: Single    Spouse name: Not on file   Number of children: Not on file   Years of education:  Not on file   Highest education level: Not on file  Occupational History   Not on file  Tobacco Use   Smoking status: Never   Smokeless tobacco: Never  Vaping Use   Vaping status: Never Used  Substance and Sexual Activity   Alcohol use: Never   Drug use: Never   Sexual activity: Not Currently  Other Topics Concern   Not on file  Social History Narrative   Not on file   Social Drivers of Health   Financial Resource Strain: Not on file  Food Insecurity: No Food Insecurity (05/23/2023)   Hunger Vital Sign    Worried About Running Out of Food in the Last Year: Never true    Ran Out of Food in the Last Year: Never true  Transportation Needs: No Transportation Needs (05/23/2023)   PRAPARE - Administrator, Civil Service (Medical): No    Lack of Transportation (Non-Medical): No  Physical Activity: Not on file  Stress: Not on file  Social Connections: Not on file   Intimate Partner Violence: Not At Risk (05/23/2023)   Humiliation, Afraid, Rape, and Kick questionnaire    Fear of Current or Ex-Partner: No    Emotionally Abused: No    Physically Abused: No    Sexually Abused: No    Past Surgical History:  Procedure Laterality Date   A/V FISTULAGRAM Left 01/28/2024   Procedure: A/V Fistulagram;  Surgeon: Renford Dills, MD;  Location: [facility];  Service: Cardiovascular;  Laterality: Left;   AV FISTULA PLACEMENT Left 08/08/2023   Procedure: ARTERIOVENOUS (AV) FISTULA CREATION (RADIALCEPHALIC);  Surgeon: Annice Needy, MD;  Location: [facility];  Service: Vascular;  Laterality: Left;  regional with MAC   BOWEL RESECTION  02/09/2020   Procedure: SMALL BOWEL RESECTION;  Surgeon: Henrene Dodge, MD;  Location: [facility];  Service: General;;   DIALYSIS/PERMA CATHETER INSERTION N/A 05/28/2023   Procedure: DIALYSIS/PERMA CATHETER INSERTION;  Surgeon: Renford Dills, MD;  Location: [facility];  Service: Cardiovascular;  Laterality: N/A;   LAPAROTOMY N/A 02/09/2020   Procedure: EXPLORATORY LAPAROTOMY;  Surgeon: Henrene Dodge, MD;  Location: [facility];  Service: General;  Laterality: N/A;    Family History  Problem Relation Age of Onset   Breast cancer Neg Hx     Allergies  Allergen Reactions   Ace Inhibitors Anaphylaxis   Lisinopril Anaphylaxis   Lipitor [Atorvastatin] Other (See Comments)    Muscle aches       Latest Ref Rng & Units 10/23/2023    1:54 PM 08/08/2023    6:27 AM 08/01/2023   11:45 AM  CBC  WBC 4.0 - 10.5 K/uL 5.8   6.6   Hemoglobin 12.0 - 15.0 g/dL 30.1  60.1  9.1   Hematocrit 36.0 - 46.0 % 41.3  31.0  28.2   Platelets 150 - 400 K/uL 242   284       CMP     Component Value Date/Time   NA 135 10/23/2023 1354   NA 133 (L) 04/22/2013 1549   K 2.5 (LL) 10/23/2023 1354   K 3.7 04/22/2013 1549   CL 96 (L) 10/23/2023 1354   CL 100 04/22/2013 1549   CO2 24 10/23/2023 1354   CO2 22 04/22/2013 1549   GLUCOSE 146  (H) 10/23/2023 1354   GLUCOSE 386 (H) 04/22/2013 1549   BUN 20 10/23/2023 1354   BUN 14 04/22/2013 1549   CREATININE 3.03 (H) 10/23/2023 1354   CREATININE  1.08 04/22/2013 1549   CALCIUM 9.0 10/23/2023 1354   CALCIUM 9.4 04/22/2013 1549   PROT 7.9 10/23/2023 1354   ALBUMIN 3.9 10/23/2023 1354   AST 16 10/23/2023 1354   ALT 10 10/23/2023 1354   ALKPHOS 86 10/23/2023 1354   BILITOT 0.8 10/23/2023 1354   GFRNONAA 17 (L) 10/23/2023 1354   GFRNONAA 59 (L) 04/22/2013 1549     No results found.     Assessment & Plan:   1. End stage renal disease (HCC) (Primary) Recommend:  The patient is experiencing increasing problems with their dialysis access.  Patient should have a fistulagram with the intention for intervention.  The intention for intervention is to restore appropriate flow and prevent thrombosis and possible loss of the access.  As well as improve the quality of dialysis therapy.  The risks, benefits and alternative therapies were reviewed in detail with the patient.  All questions were answered.  The patient agrees to proceed with angio/intervention.    The patient will follow up with me in the office after the procedure.   2. Left arm swelling I suspect the swelling is not from central stenosis but more so from the bruising issues during dialysis.  However this can also be evaluated during her fistulogram.  3. Primary hypertension Continue antihypertensive medications as already ordered, these medications have been reviewed and there are no changes at this time.   Current Outpatient Medications on File Prior to Visit  Medication Sig Dispense Refill   albuterol (PROVENTIL) (2.5 MG/3ML) 0.083% nebulizer solution Take 2.5 mg by nebulization every 4 (four) hours as needed for shortness of breath or wheezing.     albuterol (VENTOLIN HFA) 108 (90 Base) MCG/ACT inhaler Inhale 1-2 puffs into the lungs every 6 (six) hours as needed for wheezing or shortness of breath.      allopurinol (ZYLOPRIM) 300 MG tablet Take 300 mg by mouth every morning.     amLODipine (NORVASC) 5 MG tablet Take 5 mg by mouth daily.     ASPIRIN ADULT LOW STRENGTH 81 MG EC tablet Take 81 mg by mouth daily.     carvedilol (COREG) 12.5 MG tablet Take 1 tablet (12.5 mg total) by mouth 2 (two) times daily before a meal. 60 tablet 0   cetirizine (ZYRTEC) 10 MG tablet Take 10 mg by mouth every morning.     cetirizine-pseudoephedrine (ZYRTEC-D) 5-120 MG tablet Take 1 tablet by mouth 2 (two) times daily.     colchicine 0.6 MG tablet Take by mouth.     cyanocobalamin (VITAMIN B12) 1000 MCG tablet Take 1 tablet (1,000 mcg total) by mouth daily. 15 tablet 0   FEROSUL 325 (65 Fe) MG tablet Take 325 mg by mouth every other day.     furosemide (LASIX) 80 MG tablet Take 1 tablet (80 mg total) by mouth daily. For leg swelling or weight gain >5lbs (Patient taking differently: Take 80 mg by mouth every morning. For leg swelling or weight gain >5lbs) 30 tablet 2   HUMULIN N KWIKPEN 100 UNIT/ML KwikPen Inject 25 Units into the skin every morning. (Patient not taking: Reported on 01/28/2024)     hydrALAZINE (APRESOLINE) 50 MG tablet Take 1 tablet (50 mg total) by mouth every 8 (eight) hours. 90 tablet 0   HYDROcodone-acetaminophen (NORCO/VICODIN) 5-325 MG tablet Take 2 tablets by mouth every 6 (six) hours as needed for moderate pain. (Patient not taking: Reported on 01/28/2024) 20 tablet 0   hydrOXYzine (ATARAX) 50 MG tablet Take 50 mg  by mouth daily.     isosorbide mononitrate (IMDUR) 30 MG 24 hr tablet Take 1 tablet (30 mg total) by mouth 2 (two) times daily. 60 tablet 0   JANUVIA 100 MG tablet Take 100 mg by mouth every morning.     omeprazole (PRILOSEC) 20 MG capsule Take 20 mg by mouth every morning.     polyethylene glycol (MIRALAX) 17 g packet Take 17 g by mouth daily. (Patient taking differently: Take 17 g by mouth daily as needed.) 30 each 0   sertraline (ZOLOFT) 25 MG tablet Take 25 mg by mouth daily.      triamcinolone ointment (KENALOG) 0.5 % Apply 1 Application topically 2 (two) times daily.     Vitamin D, Ergocalciferol, (DRISDOL) 1.25 MG (50000 UNIT) CAPS capsule Take 50,000 Units by mouth once a week.     ZETIA 10 MG tablet Take 10 mg by mouth every morning.     zolpidem (AMBIEN) 5 MG tablet Take 1 tablet (5 mg total) by mouth at bedtime as needed for sleep. 5 tablet 0   No current facility-administered medications on file prior to visit.    There are no Patient Instructions on file for this visit. No follow-ups on file.   Georgiana Spinner, NP

## 2024-03-24 NOTE — Progress Notes (Signed)
 Subjective:    Patient ID: Brianna [name], female    DOB: 12/15/1961, 63 y.o.   MRN: [id number] No chief complaint on file.   The patient returns to the office for follow up regarding a problem with their dialysis access.   The patient notes that she had an episode where there was significant swelling and bruising in her left upper extremity with dialysis.  There is some notable left upper arm swelling and bruising but the patient notes that his improved from where it was previously.  She still has a PermCath in place and would even like this removed but there is concern with the function of her fistula before that can be done.  The patient denies redness or swelling at the access site. The patient denies fever or chills at home or while on dialysis.  No recent shortening of the patient's walking distance or new symptoms consistent with claudication.  No history of rest pain symptoms. No new ulcers or wounds of the lower extremities have occurred.  The patient denies amaurosis fugax or recent TIA symptoms. There are no recent neurological changes noted. There is no history of DVT, PE or superficial thrombophlebitis. No recent episodes of angina or shortness of breath documented.   Duplex ultrasound of the AV access shows a patent access.  The previously noted stenosis is significantly increased compared to last study.  Flow volume today is 502 cc/min (previous flow volume was 740 cc/min)    Review of Systems  Cardiovascular:        Swelling in left upper extremity  Hematological:  Bruises/bleeds easily.  All other systems reviewed and are negative.      Objective:   Physical Exam Vitals reviewed.  HENT:     Head: Normocephalic.  Cardiovascular:     Rate and Rhythm: Normal rate.     Pulses:          Radial pulses are 2+ on the left side.     Arteriovenous access: Left arteriovenous access is present.    Comments: Good thrill and bruit Pulmonary:     Effort: Pulmonary  effort is normal.  Skin:    General: Skin is warm and dry.  Neurological:     Mental Status: She is alert and oriented to person, place, and time.  Psychiatric:        Mood and Affect: Mood normal.        Behavior: Behavior normal.        Thought Content: Thought content normal.        Judgment: Judgment normal.     There were no vitals taken for this visit.  Past Medical History:  Diagnosis Date   Acid reflux    Anasarca    Anemia    Asthma    well controlled   Bilateral leg edema    Bronchopneumonia    Chronic heart failure with preserved ejection fraction (HFpEF) (HCC)    a. 12/2020 Echo: EF 70-75%, no rwma, mild LVH, GrI DD, nl RV fxn, triv MR/AI.   CKD (chronic kidney disease), stage IV (HCC)    DM (diabetes mellitus), type 2 (HCC)    Dyspnea    Hypertension    Hypokalemia    Morbid obesity (HCC)    Strangulated ventral hernia     Social History   Socioeconomic History   Marital status: Single    Spouse name: Not on file   Number of children: Not on file   Years of education:  Not on file   Highest education level: Not on file  Occupational History   Not on file  Tobacco Use   Smoking status: Never   Smokeless tobacco: Never  Vaping Use   Vaping status: Never Used  Substance and Sexual Activity   Alcohol use: Never   Drug use: Never   Sexual activity: Not Currently  Other Topics Concern   Not on file  Social History Narrative   Not on file   Social Drivers of Health   Financial Resource Strain: Not on file  Food Insecurity: No Food Insecurity (05/23/2023)   Hunger Vital Sign    Worried About Running Out of Food in the Last Year: Never true    Ran Out of Food in the Last Year: Never true  Transportation Needs: No Transportation Needs (05/23/2023)   PRAPARE - Administrator, Civil Service (Medical): No    Lack of Transportation (Non-Medical): No  Physical Activity: Not on file  Stress: Not on file  Social Connections: Not on file   Intimate Partner Violence: Not At Risk (05/23/2023)   Humiliation, Afraid, Rape, and Kick questionnaire    Fear of Current or Ex-Partner: No    Emotionally Abused: No    Physically Abused: No    Sexually Abused: No    Past Surgical History:  Procedure Laterality Date   A/V FISTULAGRAM Left 01/28/2024   Procedure: A/V Fistulagram;  Surgeon: Renford Dills, MD;  Location: [facility];  Service: Cardiovascular;  Laterality: Left;   AV FISTULA PLACEMENT Left 08/08/2023   Procedure: ARTERIOVENOUS (AV) FISTULA CREATION (RADIALCEPHALIC);  Surgeon: Annice Needy, MD;  Location: [facility];  Service: Vascular;  Laterality: Left;  regional with MAC   BOWEL RESECTION  02/09/2020   Procedure: SMALL BOWEL RESECTION;  Surgeon: Henrene Dodge, MD;  Location: [facility];  Service: General;;   DIALYSIS/PERMA CATHETER INSERTION N/A 05/28/2023   Procedure: DIALYSIS/PERMA CATHETER INSERTION;  Surgeon: Renford Dills, MD;  Location: [facility];  Service: Cardiovascular;  Laterality: N/A;   LAPAROTOMY N/A 02/09/2020   Procedure: EXPLORATORY LAPAROTOMY;  Surgeon: Henrene Dodge, MD;  Location: [facility];  Service: General;  Laterality: N/A;    Family History  Problem Relation Age of Onset   Breast cancer Neg Hx     Allergies  Allergen Reactions   Ace Inhibitors Anaphylaxis   Lisinopril Anaphylaxis   Lipitor [Atorvastatin] Other (See Comments)    Muscle aches       Latest Ref Rng & Units 10/23/2023    1:54 PM 08/08/2023    6:27 AM 08/01/2023   11:45 AM  CBC  WBC 4.0 - 10.5 K/uL 5.8   6.6   Hemoglobin 12.0 - 15.0 g/dL 30.1  60.1  9.1   Hematocrit 36.0 - 46.0 % 41.3  31.0  28.2   Platelets 150 - 400 K/uL 242   284       CMP     Component Value Date/Time   NA 135 10/23/2023 1354   NA 133 (L) 04/22/2013 1549   K 2.5 (LL) 10/23/2023 1354   K 3.7 04/22/2013 1549   CL 96 (L) 10/23/2023 1354   CL 100 04/22/2013 1549   CO2 24 10/23/2023 1354   CO2 22 04/22/2013 1549   GLUCOSE 146  (H) 10/23/2023 1354   GLUCOSE 386 (H) 04/22/2013 1549   BUN 20 10/23/2023 1354   BUN 14 04/22/2013 1549   CREATININE 3.03 (H) 10/23/2023 1354   CREATININE  1.08 04/22/2013 1549   CALCIUM 9.0 10/23/2023 1354   CALCIUM 9.4 04/22/2013 1549   PROT 7.9 10/23/2023 1354   ALBUMIN 3.9 10/23/2023 1354   AST 16 10/23/2023 1354   ALT 10 10/23/2023 1354   ALKPHOS 86 10/23/2023 1354   BILITOT 0.8 10/23/2023 1354   GFRNONAA 17 (L) 10/23/2023 1354   GFRNONAA 59 (L) 04/22/2013 1549     No results found.     Assessment & Plan:   1. End stage renal disease (HCC) (Primary) Recommend:  The patient is experiencing increasing problems with their dialysis access.  Patient should have a fistulagram with the intention for intervention.  The intention for intervention is to restore appropriate flow and prevent thrombosis and possible loss of the access.  As well as improve the quality of dialysis therapy.  The risks, benefits and alternative therapies were reviewed in detail with the patient.  All questions were answered.  The patient agrees to proceed with angio/intervention.    The patient will follow up with me in the office after the procedure.   2. Left arm swelling I suspect the swelling is not from central stenosis but more so from the bruising issues during dialysis.  However this can also be evaluated during her fistulogram.  3. Primary hypertension Continue antihypertensive medications as already ordered, these medications have been reviewed and there are no changes at this time.   Current Outpatient Medications on File Prior to Visit  Medication Sig Dispense Refill   albuterol (PROVENTIL) (2.5 MG/3ML) 0.083% nebulizer solution Take 2.5 mg by nebulization every 4 (four) hours as needed for shortness of breath or wheezing.     albuterol (VENTOLIN HFA) 108 (90 Base) MCG/ACT inhaler Inhale 1-2 puffs into the lungs every 6 (six) hours as needed for wheezing or shortness of breath.      allopurinol (ZYLOPRIM) 300 MG tablet Take 300 mg by mouth every morning.     amLODipine (NORVASC) 5 MG tablet Take 5 mg by mouth daily.     ASPIRIN ADULT LOW STRENGTH 81 MG EC tablet Take 81 mg by mouth daily.     carvedilol (COREG) 12.5 MG tablet Take 1 tablet (12.5 mg total) by mouth 2 (two) times daily before a meal. 60 tablet 0   cetirizine (ZYRTEC) 10 MG tablet Take 10 mg by mouth every morning.     cetirizine-pseudoephedrine (ZYRTEC-D) 5-120 MG tablet Take 1 tablet by mouth 2 (two) times daily.     colchicine 0.6 MG tablet Take by mouth.     cyanocobalamin (VITAMIN B12) 1000 MCG tablet Take 1 tablet (1,000 mcg total) by mouth daily. 15 tablet 0   FEROSUL 325 (65 Fe) MG tablet Take 325 mg by mouth every other day.     furosemide (LASIX) 80 MG tablet Take 1 tablet (80 mg total) by mouth daily. For leg swelling or weight gain >5lbs (Patient taking differently: Take 80 mg by mouth every morning. For leg swelling or weight gain >5lbs) 30 tablet 2   HUMULIN N KWIKPEN 100 UNIT/ML KwikPen Inject 25 Units into the skin every morning. (Patient not taking: Reported on 01/28/2024)     hydrALAZINE (APRESOLINE) 50 MG tablet Take 1 tablet (50 mg total) by mouth every 8 (eight) hours. 90 tablet 0   HYDROcodone-acetaminophen (NORCO/VICODIN) 5-325 MG tablet Take 2 tablets by mouth every 6 (six) hours as needed for moderate pain. (Patient not taking: Reported on 01/28/2024) 20 tablet 0   hydrOXYzine (ATARAX) 50 MG tablet Take 50 mg  by mouth daily.     isosorbide mononitrate (IMDUR) 30 MG 24 hr tablet Take 1 tablet (30 mg total) by mouth 2 (two) times daily. 60 tablet 0   JANUVIA 100 MG tablet Take 100 mg by mouth every morning.     omeprazole (PRILOSEC) 20 MG capsule Take 20 mg by mouth every morning.     polyethylene glycol (MIRALAX) 17 g packet Take 17 g by mouth daily. (Patient taking differently: Take 17 g by mouth daily as needed.) 30 each 0   sertraline (ZOLOFT) 25 MG tablet Take 25 mg by mouth daily.      triamcinolone ointment (KENALOG) 0.5 % Apply 1 Application topically 2 (two) times daily.     Vitamin D, Ergocalciferol, (DRISDOL) 1.25 MG (50000 UNIT) CAPS capsule Take 50,000 Units by mouth once a week.     ZETIA 10 MG tablet Take 10 mg by mouth every morning.     zolpidem (AMBIEN) 5 MG tablet Take 1 tablet (5 mg total) by mouth at bedtime as needed for sleep. 5 tablet 0   No current facility-administered medications on file prior to visit.    There are no Patient Instructions on file for this visit. No follow-ups on file.   Georgiana Spinner, NP

## 2024-03-27 ENCOUNTER — Telehealth (INDEPENDENT_AMBULATORY_CARE_PROVIDER_SITE_OTHER): Payer: Self-pay

## 2024-03-27 NOTE — Telephone Encounter (Signed)
 I contact the patient to schedule her for a LUE fistulagram with Dr. Gilda Crease. Procedure on 04/07/24 with a  3:00 pm arrival time to the [facility]. Pre-procedure instructions were discussed and will be mailed.

## 2024-04-01 ENCOUNTER — Telehealth (INDEPENDENT_AMBULATORY_CARE_PROVIDER_SITE_OTHER): Payer: Self-pay

## 2024-04-01 NOTE — Telephone Encounter (Signed)
 Patient left message stating that a stitch came out near perm-cath. I spoke with Vivia Birmingham NP and she advise it was fine. Patient was notified with medical advice and verbalized understanding.

## 2024-04-07 DIAGNOSIS — N186 End stage renal disease: Secondary | ICD-10-CM

## 2024-04-08 ENCOUNTER — Telehealth (INDEPENDENT_AMBULATORY_CARE_PROVIDER_SITE_OTHER): Payer: Self-pay

## 2024-04-08 NOTE — Telephone Encounter (Signed)
 Spoke with the patient and she has been rescheduled to 04/14/24 with a 2:00 pm arrival time to the [facility] for a left arm fistulagram with Dr. Gilda Crease. Pre-procedure instructions were discussed and will be sent to Mychart.

## 2024-04-14 ENCOUNTER — Ambulatory Visit
Admission: RE | Admit: 2024-04-14 | Discharge: 2024-04-14 | Disposition: A | Discharge status: Home / Self Care | Attending: Vascular Surgery | Admitting: Vascular Surgery

## 2024-04-14 ENCOUNTER — Other Ambulatory Visit: Payer: Self-pay

## 2024-04-14 ENCOUNTER — Encounter
Admission: RE | Disposition: A | Discharge status: Home / Self Care | Payer: Self-pay | Source: Home / Self Care | Attending: Vascular Surgery

## 2024-04-14 DIAGNOSIS — Z794 Long term (current) use of insulin: Secondary | ICD-10-CM | POA: Insufficient documentation

## 2024-04-14 DIAGNOSIS — M7989 Other specified soft tissue disorders: Secondary | ICD-10-CM | POA: Diagnosis not present

## 2024-04-14 DIAGNOSIS — I132 Hypertensive heart and chronic kidney disease with heart failure and with stage 5 chronic kidney disease, or end stage renal disease: Secondary | ICD-10-CM | POA: Insufficient documentation

## 2024-04-14 DIAGNOSIS — Y832 Surgical operation with anastomosis, bypass or graft as the cause of abnormal reaction of the patient, or of later complication, without mention of misadventure at the time of the procedure: Secondary | ICD-10-CM | POA: Insufficient documentation

## 2024-04-14 DIAGNOSIS — Z992 Dependence on renal dialysis: Secondary | ICD-10-CM | POA: Insufficient documentation

## 2024-04-14 DIAGNOSIS — Z79899 Other long term (current) drug therapy: Secondary | ICD-10-CM | POA: Diagnosis not present

## 2024-04-14 DIAGNOSIS — T82858A Stenosis of vascular prosthetic devices, implants and grafts, initial encounter: Secondary | ICD-10-CM

## 2024-04-14 DIAGNOSIS — I5032 Chronic diastolic (congestive) heart failure: Secondary | ICD-10-CM | POA: Diagnosis not present

## 2024-04-14 DIAGNOSIS — E1122 Type 2 diabetes mellitus with diabetic chronic kidney disease: Secondary | ICD-10-CM | POA: Insufficient documentation

## 2024-04-14 DIAGNOSIS — Z7984 Long term (current) use of oral hypoglycemic drugs: Secondary | ICD-10-CM | POA: Insufficient documentation

## 2024-04-14 DIAGNOSIS — N186 End stage renal disease: Secondary | ICD-10-CM | POA: Insufficient documentation

## 2024-04-14 HISTORY — PX: A/V FISTULAGRAM: CATH118298

## 2024-04-14 LAB — GLUCOSE, CAPILLARY
Glucose-Capillary: 77 mg/dL (ref 70–99)
Glucose-Capillary: 130 mg/dL — ABNORMAL HIGH (ref 70–99)

## 2024-04-14 LAB — POTASSIUM (ARMC VASCULAR LAB ONLY): Potassium (ARMC vascular lab): 3.1 mmol/L — ABNORMAL LOW (ref 3.5–5.1)

## 2024-04-14 SURGERY — A/V FISTULAGRAM
Anesthesia: Moderate Sedation | Laterality: Left

## 2024-04-14 MED ORDER — DEXTROSE 50 % IV SOLN
INTRAVENOUS | Status: AC
  Filled 2024-04-14: qty 50

## 2024-04-14 MED ORDER — HYDROMORPHONE HCL 1 MG/ML IJ SOLN: 1.0000 mg | Freq: Once | INTRAMUSCULAR | Status: DC | PRN

## 2024-04-14 MED ORDER — HEPARIN (PORCINE) IN NACL 1000-0.9 UT/500ML-% IV SOLN
INTRAVENOUS | Status: DC | PRN
  Administered 2024-04-14: 500 mL

## 2024-04-14 MED ORDER — FAMOTIDINE 20 MG PO TABS: 40.0000 mg | ORAL_TABLET | Freq: Once | ORAL | Status: DC | PRN

## 2024-04-14 MED ORDER — LIDOCAINE HCL (PF) 1 % IJ SOLN
INTRAMUSCULAR | Status: DC | PRN
  Administered 2024-04-14: 10 mL

## 2024-04-14 MED ORDER — DEXTROSE 50 % IV SOLN
25.0000 mL | Freq: Once | INTRAVENOUS | Status: AC
  Administered 2024-04-14: 25 mL via INTRAVENOUS

## 2024-04-14 MED ORDER — CEFAZOLIN SODIUM-DEXTROSE 1-4 GM/50ML-% IV SOLN
INTRAVENOUS | Status: AC
Start: 2024-04-14 — End: ?
  Filled 2024-04-14: qty 50

## 2024-04-14 MED ORDER — MIDAZOLAM HCL 2 MG/2ML IJ SOLN
INTRAMUSCULAR | Status: DC | PRN
  Administered 2024-04-14: 2 mg via INTRAVENOUS

## 2024-04-14 MED ORDER — METHYLPREDNISOLONE SODIUM SUCC 125 MG IJ SOLR
125.0000 mg | Freq: Once | INTRAMUSCULAR | Status: DC | PRN
Start: 2024-04-14 — End: 2024-04-14

## 2024-04-14 MED ORDER — SODIUM CHLORIDE 0.9 % IV SOLN: INTRAVENOUS | Status: DC

## 2024-04-14 MED ORDER — CEFAZOLIN SODIUM-DEXTROSE 1-4 GM/50ML-% IV SOLN
1.0000 g | INTRAVENOUS | Status: AC
  Administered 2024-04-14: 1 g via INTRAVENOUS

## 2024-04-14 MED ORDER — FENTANYL CITRATE (PF) 100 MCG/2ML IJ SOLN
INTRAMUSCULAR | Status: DC | PRN
  Administered 2024-04-14: 50 ug via INTRAVENOUS

## 2024-04-14 MED ORDER — MIDAZOLAM HCL 2 MG/2ML IJ SOLN
INTRAMUSCULAR | Status: AC
  Filled 2024-04-14: qty 2

## 2024-04-14 MED ORDER — HEPARIN SODIUM (PORCINE) 1000 UNIT/ML IJ SOLN
INTRAMUSCULAR | Status: AC
  Filled 2024-04-14: qty 10

## 2024-04-14 MED ORDER — ONDANSETRON HCL 4 MG/2ML IJ SOLN: 4.0000 mg | Freq: Four times a day (QID) | INTRAMUSCULAR | Status: DC | PRN

## 2024-04-14 MED ORDER — MIDAZOLAM HCL 2 MG/ML PO SYRP: 8.0000 mg | ORAL_SOLUTION | Freq: Once | ORAL | Status: DC | PRN

## 2024-04-14 MED ORDER — IODIXANOL 320 MG/ML IV SOLN
INTRAVENOUS | Status: DC | PRN
  Administered 2024-04-14: 20 mL

## 2024-04-14 MED ORDER — FENTANYL CITRATE PF 50 MCG/ML IJ SOSY
PREFILLED_SYRINGE | INTRAMUSCULAR | Status: AC
  Filled 2024-04-14: qty 1

## 2024-04-14 MED ORDER — HEPARIN SODIUM (PORCINE) 1000 UNIT/ML IJ SOLN
INTRAMUSCULAR | Status: DC | PRN
  Administered 2024-04-14: 4000 [IU] via INTRAVENOUS

## 2024-04-14 MED ORDER — DIPHENHYDRAMINE HCL 50 MG/ML IJ SOLN: 50.0000 mg | Freq: Once | INTRAMUSCULAR | Status: DC | PRN

## 2024-04-14 SURGICAL SUPPLY — 18 items
BALLN LUTONIX 018 4X40X130 (BALLOONS) ×1 IMPLANT
BALLN ULTRV 018 3X40X75 (BALLOONS) ×1 IMPLANT
BALLOON LUTONIX 018 4X40X130 (BALLOONS) IMPLANT
BALLOON ULTRV 018 3X40X75 (BALLOONS) IMPLANT
CATH BEACON 5 .035 40 KMP TP (CATHETERS) IMPLANT
CATH BEACON 5 .038 100 VERT TP (CATHETERS) IMPLANT
COVER PROBE ULTRASOUND 5X96 (MISCELLANEOUS) IMPLANT
DEVICE PRESTO INFLATION (MISCELLANEOUS) IMPLANT
DRAPE BRACHIAL (DRAPES) IMPLANT
GOWN STRL REUS W/ TWL LRG LVL3 (GOWN DISPOSABLE) ×1 IMPLANT
GUIDEWIRE ANGLED .035 180CM (WIRE) IMPLANT
NDL ENTRY 21GA 7CM ECHOTIP (NEEDLE) IMPLANT
NEEDLE ENTRY 21GA 7CM ECHOTIP (NEEDLE) ×1 IMPLANT
PACK ANGIOGRAPHY (CUSTOM PROCEDURE TRAY) ×1 IMPLANT
SET INTRO CAPELLA COAXIAL (SET/KITS/TRAYS/PACK) IMPLANT
SHEATH BRITE TIP 6FRX5.5 (SHEATH) IMPLANT
SUT MNCRL AB 4-0 PS2 18 (SUTURE) IMPLANT
WIRE G V18X300CM (WIRE) IMPLANT

## 2024-04-14 NOTE — Interval H&P Note (Signed)
 History and Physical Interval Note:  04/14/2024 3:13 PM  Brianna [name]  has presented today for surgery, with the diagnosis of LUE fistulagram    End Stage Renal.  The various methods of treatment have been discussed with the patient and family. After consideration of risks, benefits and other options for treatment, the patient has consented to  Procedure(s): A/V Fistulagram (Left) as a surgical intervention.  The patient's history has been reviewed, patient examined, no change in status, stable for surgery.  I have reviewed the patient's chart and labs.  Questions were answered to the patient's satisfaction.     [name]

## 2024-04-14 NOTE — Op Note (Signed)
 OPERATIVE NOTE   PROCEDURE: Contrast injection left arm radiocephalic AV access Percutaneous transluminal angioplasty anastomosis left arm radiocephalic fistula  PRE-OPERATIVE DIAGNOSIS: Complication of dialysis access                                                       End Stage Renal Disease  POST-OPERATIVE DIAGNOSIS: same as above   SURGEON: Jackquelyn Mass, M.D.  ANESTHESIA: Conscious sedation was administered under my direct supervision by the interventional radiology RN. IV Versed plus fentanyl were utilized. Continuous ECG, pulse oximetry and blood pressure was monitored throughout the entire procedure.  Conscious sedation was for a total of 35 minutes.  ESTIMATED BLOOD LOSS: minimal  FINDING(S): Stricture of the AV graft  SPECIMEN(S):  None  CONTRAST: 20 cc  FLUOROSCOPY TIME: 3 minutes  INDICATIONS: Kimberly Nieland is a 63 y.o. female who  presents with malfunctioning left arm AV access.  The patient is scheduled for angiography with possible intervention of the AV access.  The patient is aware the risks include but are not limited to: bleeding, infection, thrombosis of the cannulated access, and possible anaphylactic reaction to the contrast.  The patient acknowledges if the access can not be salvaged a tunneled catheter will be needed and will be placed during this procedure.  The patient is aware of the risks of the procedure and elects to proceed with the angiogram and intervention.  DESCRIPTION: After full informed written consent was obtained, the patient was brought back to the Special Procedure suite and placed supine position.  Appropriate cardiopulmonary monitors were placed.  The left arm was prepped and draped in the standard fashion.  Appropriate timeout is called. The left radiocephalic was cannulated with a micropuncture needle in a retrograde direction just below the antecubital fossa.  Cannulation was performed with ultrasound guidance. Ultrasound was  placed in a sterile sleeve, the AV access was interrogated and noted to be echolucent and compressible indicating patency. Image was recorded for the permanent record. The puncture is performed under continuous ultrasound visualization.   The microwire was advanced and the needle was exchanged for  a microsheath.  The J-wire was then advanced and a 6 Fr sheath inserted.  Using floppy Glidewire and a Kumpe catheter the wire and catheter were negotiated into the brachial artery and hand injections were completed to image the access from the distal brachial artery through the arterial anastomosis and the entire access.  Initial imaging demonstrate the brachial artery and radial artery appear widely patent.  No hemodynamically significant stenosis is identified.  The anastomosis demonstrates a greater than 60% stenosis similar to the one that was treated several months ago.  The vein itself is otherwise widely patent up to the confluence with the subclavian vein visualized portion of the subclavian and Ondemet vein are widely patent.  Based on the images,  4000 units of heparin was given and a wire was negotiated through the strictures within the venous portion of the graft.  An 3 mm x 40 mm balloon was advanced across the anastomosis and inflated to 10 atm for 1 minute.  Following this a 4 mm x 40 mm Lutonix drug-eluting balloon was advanced across the anastomosis, inflation was to 8 atm for 1 minute.  Follow-up imaging demonstrates resolution of the stricture with rapid flow of contrast through the  graft, the central venous anatomy is preserved.  There is less than 10% residual stenosis at the anastomosis.  A 4-0 Monocryl purse-string suture was sewn around the sheath.  The sheath was removed and light pressure was applied.  A sterile bandage was applied to the puncture site.    COMPLICATIONS: None  CONDITION: Kaylyn Paschal, M.D [facility] Office: [phone]  04/14/2024  3:47 PM

## 2024-04-15 ENCOUNTER — Encounter: Payer: Self-pay | Admitting: Vascular Surgery

## 2024-04-28 ENCOUNTER — Telehealth (INDEPENDENT_AMBULATORY_CARE_PROVIDER_SITE_OTHER): Payer: Self-pay

## 2024-04-28 ENCOUNTER — Other Ambulatory Visit (INDEPENDENT_AMBULATORY_CARE_PROVIDER_SITE_OTHER): Payer: Self-pay | Admitting: Nurse Practitioner

## 2024-04-28 MED ORDER — HYDROCODONE-ACETAMINOPHEN 5-325 MG PO TABS: 2.0000 | ORAL_TABLET | Freq: Four times a day (QID) | ORAL | 0 refills | Status: DC | PRN

## 2024-04-28 NOTE — Telephone Encounter (Signed)
 sent

## 2024-04-28 NOTE — Telephone Encounter (Signed)
 This is abnormal post intervention, as we didn't work on her shoulder area.  There can be some pain from the stretch ing of the vessel.  We can send in a one time dose of pain medication

## 2024-05-11 ENCOUNTER — Telehealth (INDEPENDENT_AMBULATORY_CARE_PROVIDER_SITE_OTHER): Payer: Self-pay | Admitting: Nurse Practitioner

## 2024-05-11 NOTE — Telephone Encounter (Signed)
 Pt called and stated she needed an appt. Pt was informed she had an appt on 05/21/24 with [facility]. Pt then stated she was having new symptoms. Pt stated her new symptoms were issues with her fistula and arm swelling. Attempted to transfer pt to the nurses line but pt refused. Pt was advised to be transferred to nurses line to have symptoms assessed but pt refused again and stated she would be fine to wait tilll 05/22/23.

## 2024-05-19 ENCOUNTER — Encounter (INDEPENDENT_AMBULATORY_CARE_PROVIDER_SITE_OTHER): Payer: Self-pay

## 2024-05-19 ENCOUNTER — Other Ambulatory Visit (INDEPENDENT_AMBULATORY_CARE_PROVIDER_SITE_OTHER): Payer: Self-pay | Admitting: Vascular Surgery

## 2024-05-19 DIAGNOSIS — N186 End stage renal disease: Secondary | ICD-10-CM

## 2024-05-20 ENCOUNTER — Encounter (INDEPENDENT_AMBULATORY_CARE_PROVIDER_SITE_OTHER)

## 2024-05-20 ENCOUNTER — Ambulatory Visit (INDEPENDENT_AMBULATORY_CARE_PROVIDER_SITE_OTHER): Admitting: Nurse Practitioner

## 2024-05-21 ENCOUNTER — Encounter (INDEPENDENT_AMBULATORY_CARE_PROVIDER_SITE_OTHER)

## 2024-05-21 ENCOUNTER — Encounter (INDEPENDENT_AMBULATORY_CARE_PROVIDER_SITE_OTHER): Payer: Self-pay

## 2024-05-21 ENCOUNTER — Ambulatory Visit (INDEPENDENT_AMBULATORY_CARE_PROVIDER_SITE_OTHER): Admitting: Nurse Practitioner

## 2024-05-21 ENCOUNTER — Telehealth (INDEPENDENT_AMBULATORY_CARE_PROVIDER_SITE_OTHER): Payer: Self-pay | Admitting: Vascular Surgery

## 2024-05-21 NOTE — Telephone Encounter (Signed)
 Pt called to cancel appt day of which is a no showed appt. Pt was offered next appt for 05/26/24 to reschedule but stated she would call back later after she looked at her calendar to reschedule

## 2024-06-04 ENCOUNTER — Ambulatory Visit (INDEPENDENT_AMBULATORY_CARE_PROVIDER_SITE_OTHER)

## 2024-06-04 ENCOUNTER — Encounter (INDEPENDENT_AMBULATORY_CARE_PROVIDER_SITE_OTHER)

## 2024-06-04 DIAGNOSIS — N186 End stage renal disease: Secondary | ICD-10-CM | POA: Diagnosis not present

## 2024-06-11 ENCOUNTER — Ambulatory Visit (INDEPENDENT_AMBULATORY_CARE_PROVIDER_SITE_OTHER): Admitting: Vascular Surgery

## 2024-06-25 ENCOUNTER — Encounter (INDEPENDENT_AMBULATORY_CARE_PROVIDER_SITE_OTHER): Payer: Self-pay | Admitting: Nurse Practitioner

## 2024-06-25 ENCOUNTER — Ambulatory Visit (INDEPENDENT_AMBULATORY_CARE_PROVIDER_SITE_OTHER): Admitting: Nurse Practitioner

## 2024-06-25 ENCOUNTER — Ambulatory Visit (INDEPENDENT_AMBULATORY_CARE_PROVIDER_SITE_OTHER): Admitting: Vascular Surgery

## 2024-06-25 VITALS — BP 117/70 | HR 82 | Resp 18 | Wt 184.6 lb

## 2024-06-25 DIAGNOSIS — I10 Essential (primary) hypertension: Secondary | ICD-10-CM

## 2024-06-25 DIAGNOSIS — E119 Type 2 diabetes mellitus without complications: Secondary | ICD-10-CM

## 2024-06-25 DIAGNOSIS — M79602 Pain in left arm: Secondary | ICD-10-CM | POA: Diagnosis not present

## 2024-06-25 DIAGNOSIS — N186 End stage renal disease: Secondary | ICD-10-CM | POA: Diagnosis not present

## 2024-06-25 DIAGNOSIS — Z794 Long term (current) use of insulin: Secondary | ICD-10-CM

## 2024-06-26 MED ORDER — HYDROCODONE-ACETAMINOPHEN 5-325 MG PO TABS: 1.0000 | ORAL_TABLET | Freq: Four times a day (QID) | ORAL | 0 refills | Status: DC | PRN

## 2024-06-28 ENCOUNTER — Encounter (INDEPENDENT_AMBULATORY_CARE_PROVIDER_SITE_OTHER): Payer: Self-pay | Admitting: Nurse Practitioner

## 2024-06-28 NOTE — Progress Notes (Signed)
 Subjective:    Patient ID: Brianna [name], female    DOB: 01/24/1961, 63 y.o.   MRN: [id number] Chief Complaint  Patient presents with   Follow-up    [facility] 4 week follow up    The patient returns to the office for followup status post intervention of the dialysis access her left radiocephalic fistula 5/84/7974.   Following the intervention the excess function was unchanged per the patient.  The patient continues to be experiencing increased bleeding times following decannulation and increased recirculation with diminished efficiency of their dialysis. The patient denies an increase in arm swelling.  She endorses significant arm pain.  She notes that this pain has been ongoing since the placement for her surgery.  She notes that it radiates from her shoulder down to her fingertips.  She notes a stinging sensation.  It is consistent whether or not she is on dialysis.  No recent shortening of the patient's walking distance or new symptoms consistent with claudication.  No history of rest pain symptoms. No new ulcers or wounds of the lower extremities have occurred.  The patient denies amaurosis fugax or recent TIA symptoms. There are no recent neurological changes noted. There is no history of DVT, PE or superficial thrombophlebitis. No recent episodes of angina or shortness of breath documented.   Duplex ultrasound of the AV access shows a patent access.  The previously noted stenosis is not improved compared to last study.  Flow volume today is 756 cc/min (previous flow volume was 506 cc/min)    Review of Systems  Neurological:  Positive for numbness.  All other systems reviewed and are negative.      Objective:   Physical Exam Vitals reviewed.  HENT:     Head: Normocephalic.   Cardiovascular:     Rate and Rhythm: Normal rate.     Pulses:          Radial pulses are 2+ on the left side.  Pulmonary:     Effort: Pulmonary effort is normal.   Skin:    General: Skin is warm  and dry.   Neurological:     Mental Status: She is alert and oriented to person, place, and time.   Psychiatric:        Mood and Affect: Affect is angry.        Behavior: Behavior is agitated.        Thought Content: Thought content normal.        Judgment: Judgment normal.     BP 117/70   Pulse 82   Resp 18   Wt 184 lb 9.6 oz (83.7 kg)   BMI 39.95 kg/m   Past Medical History:  Diagnosis Date   Acid reflux    Anasarca    Anemia    Asthma    well controlled   Bilateral leg edema    Bronchopneumonia    Chronic heart failure with preserved ejection fraction (HFpEF) (HCC)    a. 12/2020 Echo: EF 70-75%, no rwma, mild LVH, GrI DD, nl RV fxn, triv MR/AI.   CKD (chronic kidney disease), stage IV (HCC)    DM (diabetes mellitus), type 2 (HCC)    Dyspnea    Hypertension    Hypokalemia    Morbid obesity (HCC)    Strangulated ventral hernia     Social History   Socioeconomic History   Marital status: Single    Spouse name: Not on file   Number of children: Not on file  Years of education: Not on file   Highest education level: Not on file  Occupational History   Not on file  Tobacco Use   Smoking status: Never   Smokeless tobacco: Never  Vaping Use   Vaping status: Never Used  Substance and Sexual Activity   Alcohol use: Never   Drug use: Never   Sexual activity: Not Currently  Other Topics Concern   Not on file  Social History Narrative   Not on file   Social Drivers of Health   Financial Resource Strain: High Risk (06/04/2024)   Received from [facility]   Overall Financial Resource Strain (CARDIA)    Difficulty of Paying Living Expenses: Very hard  Food Insecurity: Food Insecurity Present (06/04/2024)   Received from [facility]   Hunger Vital Sign    Within the past 12 months, you worried that your food would run out before you got the money to buy more.: Often true    Within the past 12 months, the food you bought just  didn't last and you didn't have money to get more.: Often true  Transportation Needs: Unmet Transportation Needs (06/04/2024)   Received from [facility] - Transportation    In the past 12 months, has lack of transportation kept you from medical appointments or from getting medications?: No    Lack of Transportation (Non-Medical): Yes  Physical Activity: Not on file  Stress: Not on file  Social Connections: Not on file  Intimate Partner Violence: Not At Risk (05/23/2023)   Humiliation, Afraid, Rape, and Kick questionnaire    Fear of Current or Ex-Partner: No    Emotionally Abused: No    Physically Abused: No    Sexually Abused: No    Past Surgical History:  Procedure Laterality Date   A/V FISTULAGRAM Left 01/28/2024   Procedure: A/V Fistulagram;  Surgeon: Jama Cordella MATSU, MD;  Location: [facility] INVASIVE [facility];  Service: Cardiovascular;  Laterality: Left;   A/V FISTULAGRAM Left 04/14/2024   Procedure: A/V Fistulagram;  Surgeon: Jama Cordella MATSU, MD;  Location: [facility];  Service: Cardiovascular;  Laterality: Left;   AV FISTULA PLACEMENT Left 08/08/2023   Procedure: ARTERIOVENOUS (AV) FISTULA CREATION (RADIALCEPHALIC);  Surgeon: Marea Selinda RAMAN, MD;  Location: [facility] ORS;  Service: Vascular;  Laterality: Left;  regional with MAC   BOWEL RESECTION  02/09/2020   Procedure: SMALL BOWEL RESECTION;  Surgeon: Desiderio Schanz, MD;  Location: [facility] ORS;  Service: General;;   DIALYSIS/PERMA CATHETER INSERTION N/A 05/28/2023   Procedure: DIALYSIS/PERMA CATHETER INSERTION;  Surgeon: Jama Cordella MATSU, MD;  Location: [facility] CV [facility];  Service: Cardiovascular;  Laterality: N/A;   LAPAROTOMY N/A 02/09/2020   Procedure: EXPLORATORY LAPAROTOMY;  Surgeon: Desiderio Schanz, MD;  Location: [facility] ORS;  Service: General;  Laterality: N/A;    Family History  Problem Relation Age of Onset   Breast cancer Neg Hx     Allergies  Allergen Reactions   Ace Inhibitors Anaphylaxis    Lisinopril Anaphylaxis   Lipitor [Atorvastatin] Other (See Comments)    Muscle aches       Latest Ref Rng & Units 10/23/2023    1:54 PM 08/08/2023    6:27 AM 08/01/2023   11:45 AM  CBC  WBC 4.0 - 10.5 K/uL 5.8   6.6   Hemoglobin 12.0 - 15.0 g/dL 86.3  89.4  9.1   Hematocrit 36.0 - 46.0 % 41.3  31.0  28.2  Platelets 150 - 400 K/uL 242   284       CMP     Component Value Date/Time   NA 135 10/23/2023 1354   NA 133 (L) 04/22/2013 1549   K 2.5 (LL) 10/23/2023 1354   K 3.7 04/22/2013 1549   CL 96 (L) 10/23/2023 1354   CL 100 04/22/2013 1549   CO2 24 10/23/2023 1354   CO2 22 04/22/2013 1549   GLUCOSE 146 (H) 10/23/2023 1354   GLUCOSE 386 (H) 04/22/2013 1549   BUN 20 10/23/2023 1354   BUN 14 04/22/2013 1549   CREATININE 3.03 (H) 10/23/2023 1354   CREATININE 1.08 04/22/2013 1549   CALCIUM 9.0 10/23/2023 1354   CALCIUM 9.4 04/22/2013 1549   PROT 7.9 10/23/2023 1354   ALBUMIN 3.9 10/23/2023 1354   AST 16 10/23/2023 1354   ALT 10 10/23/2023 1354   ALKPHOS 86 10/23/2023 1354   BILITOT 0.8 10/23/2023 1354   GFRNONAA 17 (L) 10/23/2023 1354   GFRNONAA 59 (L) 04/22/2013 1549     No results found.     Assessment & Plan:   1. ESRD (end stage renal disease) (HCC) (Primary) Discussion with the patient regarding her fistula.  It is somewhat difficult to ascertain whether her fistula is not functional because of issues with dialysis or if because she has needle phobia and she is not allowing them to stick it properly.  Given that she is having so many issues with her fistula following fistulogram's, I have given her 2 options at this time.  The first option was to redo fistulogram to see if there is a continued stenosis of her fistula after intervention.  However because her fistula has continued to have issues after multiple interventions the additional option is to revise her fistula from a radiocephalic to a radiocephalic AV fistula.  However in order to do so we will need to review  her vein mapping to make sure she has an adequate vein for use.  The patient is unsure which option go with at this time and wishes to think about it.  Will have her return for vein mapping at which point we can rediscuss her options.  2. Primary hypertension Continue antihypertensive medications as already ordered, these medications have been reviewed and there are no changes at this time.  3. Type 2 diabetes mellitus without complication, with long-term current use of insulin  (HCC) Continue hypoglycemic medications as already ordered, these medications have been reviewed and there are no changes at this time.  Hgb A1C to be monitored as already arranged by primary service  4. Pain of left upper extremity Based upon the patient's description of pain I do not think that this is related to steal syndrome.  Given that it originates from her shoulder and radiates down I suspect it is related to her nerve block that was done before her surgery for her fistula placement.  We have given her a one-time refill of her pain medication.  Will refer to pain management for further evaluation and treatment options. - Ambulatory referral to Pain Clinic   Current Outpatient Medications on File Prior to Visit  Medication Sig Dispense Refill   albuterol  (PROVENTIL ) (2.5 MG/3ML) 0.083% nebulizer solution Take 2.5 mg by nebulization every 4 (four) hours as needed for shortness of breath or wheezing.     albuterol  (VENTOLIN  HFA) 108 (90 Base) MCG/ACT inhaler Inhale 1-2 puffs into the lungs every 6 (six) hours as needed for wheezing or shortness of breath.  allopurinol  (ZYLOPRIM ) 300 MG tablet Take 300 mg by mouth every morning.     amLODipine  (NORVASC ) 5 MG tablet Take 5 mg by mouth daily.     ASPIRIN  ADULT LOW STRENGTH 81 MG EC tablet Take 81 mg by mouth daily.     carvedilol  (COREG ) 12.5 MG tablet Take 1 tablet (12.5 mg total) by mouth 2 (two) times daily before a meal. 60 tablet 0   cetirizine (ZYRTEC) 10 MG  tablet Take 10 mg by mouth every morning.     cetirizine-pseudoephedrine (ZYRTEC-D) 5-120 MG tablet Take 1 tablet by mouth 2 (two) times daily.     colchicine 0.6 MG tablet Take by mouth.     cyanocobalamin  (VITAMIN B12) 1000 MCG tablet Take 1 tablet (1,000 mcg total) by mouth daily. 15 tablet 0   FEROSUL 325 (65 Fe) MG tablet Take 325 mg by mouth every other day.     furosemide  (LASIX ) 80 MG tablet Take 1 tablet (80 mg total) by mouth daily. For leg swelling or weight gain >5lbs (Patient taking differently: Take 80 mg by mouth every morning. For leg swelling or weight gain >5lbs) 30 tablet 2   hydrALAZINE  (APRESOLINE ) 50 MG tablet Take 1 tablet (50 mg total) by mouth every 8 (eight) hours. 90 tablet 0   hydrOXYzine (ATARAX) 50 MG tablet Take 50 mg by mouth daily.     isosorbide  mononitrate (IMDUR ) 30 MG 24 hr tablet Take 1 tablet (30 mg total) by mouth 2 (two) times daily. 60 tablet 0   JANUVIA 100 MG tablet Take 100 mg by mouth every morning.     lactulose  (CHRONULAC ) 10 GM/15ML solution SMARTSIG:Milliliter(s) By Mouth     omeprazole (PRILOSEC) 20 MG capsule Take 20 mg by mouth every morning.     OZEMPIC, 0.25 OR 0.5 MG/DOSE, 2 MG/3ML SOPN      sertraline (ZOLOFT) 25 MG tablet Take 25 mg by mouth daily.     sevelamer carbonate (RENVELA) 800 MG tablet Take 1,600 mg by mouth 3 (three) times daily.     Vitamin D , Ergocalciferol , (DRISDOL) 1.25 MG (50000 UNIT) CAPS capsule Take 50,000 Units by mouth once a week.     ZETIA 10 MG tablet Take 10 mg by mouth every morning.     zolpidem  (AMBIEN ) 5 MG tablet Take 1 tablet (5 mg total) by mouth at bedtime as needed for sleep. 5 tablet 0   HUMULIN  N KWIKPEN 100 UNIT/ML KwikPen Inject 25 Units into the skin every morning. (Patient not taking: Reported on 04/14/2024)     polyethylene glycol (MIRALAX ) 17 g packet Take 17 g by mouth daily. (Patient not taking: Reported on 06/25/2024) 30 each 0   triamcinolone ointment (KENALOG) 0.5 % Apply 1 Application  topically 2 (two) times daily. (Patient not taking: Reported on 06/25/2024)     No current facility-administered medications on file prior to visit.    There are no Patient Instructions on file for this visit. No follow-ups on file.   Montrae Braithwaite E Lorella Gomez, NP

## 2024-06-30 ENCOUNTER — Telehealth (INDEPENDENT_AMBULATORY_CARE_PROVIDER_SITE_OTHER): Payer: Self-pay

## 2024-06-30 NOTE — Telephone Encounter (Signed)
 Patient called wanting something else called into [facility] for pain medication as they won't deliver what was called in.

## 2024-06-30 NOTE — Telephone Encounter (Signed)
 What will they deliver?

## 2024-07-07 ENCOUNTER — Ambulatory Visit: Admitting: Student in an Organized Health Care Education/Training Program

## 2024-07-14 ENCOUNTER — Ambulatory Visit
Attending: Student in an Organized Health Care Education/Training Program | Admitting: Student in an Organized Health Care Education/Training Program

## 2024-07-14 ENCOUNTER — Encounter: Payer: Self-pay | Admitting: Student in an Organized Health Care Education/Training Program

## 2024-07-14 VITALS — BP 136/64 | HR 73 | Temp 98.7°F | Resp 18 | Ht <= 58 in | Wt 184.0 lb

## 2024-07-14 DIAGNOSIS — F419 Anxiety disorder, unspecified: Secondary | ICD-10-CM | POA: Insufficient documentation

## 2024-07-14 DIAGNOSIS — Z7985 Long-term (current) use of injectable non-insulin antidiabetic drugs: Secondary | ICD-10-CM

## 2024-07-14 DIAGNOSIS — G894 Chronic pain syndrome: Secondary | ICD-10-CM | POA: Diagnosis not present

## 2024-07-14 DIAGNOSIS — F32A Depression, unspecified: Secondary | ICD-10-CM | POA: Insufficient documentation

## 2024-07-14 DIAGNOSIS — E1140 Type 2 diabetes mellitus with diabetic neuropathy, unspecified: Secondary | ICD-10-CM | POA: Insufficient documentation

## 2024-07-14 DIAGNOSIS — Z7984 Long term (current) use of oral hypoglycemic drugs: Secondary | ICD-10-CM

## 2024-07-14 NOTE — Patient Instructions (Signed)
 Capsaicin Topical System What is this medication? CAPSAICIN (cap SAY sin) treats nerve pain. It works by making your skin feel warm or cool, which blocks pain signals going to the brain. This medicine may be used for other purposes; ask your health care provider or pharmacist if you have questions. COMMON BRAND NAME(S): Qutenza What should I tell my care team before I take this medication? They need to know if you have any of these conditions: Have had a heart attack or stroke High blood pressure Large areas of burned or damaged skin An unusual or allergic reaction to capsaicin, hot peppers, other medications, foods, dyes, or preservatives Pregnant or trying to get pregnant Breastfeeding How should I use this medication? This medication is for external use only. It is applied by your care team in a hospital or clinic setting. Talk to your care team about the use of this medication in children. Special care may be needed. Overdosage: If you think you have taken too much of this medicine contact a poison control center or emergency room at once. NOTE: This medicine is only for you. Do not share this medicine with others. What if I miss a dose? This does not apply. What may interact with this medication? Interactions are not expected. Do not use any other skin products on the affected area without asking your care team. This list may not describe all possible interactions. Give your health care provider a list of all the medicines, herbs, non-prescription drugs, or dietary supplements you use. Also tell them if you smoke, drink alcohol, or use illegal drugs. Some items may interact with your medicine. What should I watch for while using this medication? Your condition will be monitored carefully while you are receiving this medication. Tell your care team if your symptoms do not start to get better or if they get worse. Talk to your care team about how to treat discomfort. You may place a  cooling pack from the refrigerator (not the freezer) on the area. Do not place it directly on the skin. Try not to touch the area where the patch was applied. If you do, wash your hands with soap and water right away. Your skin may be sensitive to heat for a few days after treatment. Avoid hot baths or showers, heating pads, and direct sunlight on the treated area. What side effects may I notice from receiving this medication? Side effects that you should report to your care team as soon as possible: Allergic reactions--skin rash, itching, hives, swelling of the face, lips, tongue, or throat Burning, itching, crusting, or peeling of treated skin Increase in blood pressure Numbness, decrease in sense of touch or sensation Side effects that usually do not require medical attention (report these to your care team if they continue or are bothersome): Mild skin irritation, redness, or dryness This list may not describe all possible side effects. Call your doctor for medical advice about side effects. You may report side effects to FDA at 1-800-FDA-1088. Where should I keep my medication? This medication is given in a hospital or clinic. It will not be stored at home. NOTE: This sheet is a summary. It may not cover all possible information. If you have questions about this medicine, talk to your doctor, pharmacist, or health care provider.  2024 Elsevier/Gold Standard (2023-11-29 00:00:00)

## 2024-07-14 NOTE — Progress Notes (Signed)
 PROVIDER NOTE: Interpretation of information contained herein should be left to medically-trained personnel. Specific patient instructions are provided elsewhere under Patient Instructions section of medical record. This document was created in part using AI and STT-dictation technology, any transcriptional errors that may result from this process are unintentional.  Patient: Brianna [name]  Service: E/M Encounter  Provider: Wallie Sherry, MD  DOB: 12-Sep-1961  Delivery: Face-to-face  Specialty: Interventional Pain Management  MRN: [id number]  Setting: Ambulatory outpatient facility  Specialty designation: 09  Type: New Patient  Location: Outpatient office facility  PCP: Lorel Maxie LABOR, MD  DOS: 07/14/2024    Referring Prov.: Sherry Munsoor, MD   Primary Reason(s) for Visit: Encounter for initial evaluation of one or more chronic problems (new to examiner) potentially causing chronic pain, and posing a threat to normal musculoskeletal function. (Level of risk: High) CC: Shoulder Pain (left)  HPI  Brianna [name] is a 64 y.o. year old, female patient, who comes for the first time to our practice referred by Marvelene Stoneberg, Munsoor, MD for our initial evaluation of her chronic pain. She has Strangulated ventral hernia; Type 2 diabetes mellitus without complication, with long-term current use of insulin  (HCC); Acute renal failure superimposed on stage 4 chronic kidney disease (HCC); Hypertension; Bronchopneumonia; Type II diabetes mellitus with renal manifestations (HCC); Acid reflux; Asthma; Anemia due to stage 4 chronic kidney disease (HCC); Hypokalemia; Dyspnea; Bilateral lower extremity edema; Volume overload; Anasarca; (HFpEF) heart failure with preserved ejection fraction (HCC); Acute on chronic congestive heart failure (HCC); End stage renal disease (HCC); Morbidly obese (HCC); Acute on chronic diastolic CHF (congestive heart failure) (HCC); Anemia of chronic kidney failure; Chronic pain syndrome; Chronic  painful diabetic neuropathy (HCC); and Anxiety and depression on their problem list. Today she comes in for evaluation of her Shoulder Pain (left)  Pain Assessment: Location: Left Shoulder Radiating: left arm to the hand, with numbness in the hand Onset: More than a month ago Duration: Chronic pain Quality: Other (Comment) (aggravating) Severity: 10-Worst pain ever/10 (subjective, self-reported pain score)  Effect on ADL: difficulty performing daily activities Timing: Constant Modifying factors: Tylenol  BP: 136/64  HR: 73  Onset and Duration: Sudden and Present longer than 3 months Cause of pain: Surgery Severity: NAS-11 at its worse: 10/10, NAS-11 at its best: 5/10, NAS-11 now: 10/10, and NAS-11 on the average: 10/10 Timing: Night, During activity or exercise, and After activity or exercise Aggravating Factors: Bending, Motion, Nerve blocks, Twisting, and Walking Alleviating Factors: Resting, Sitting, and Standing Associated Problems: Color changes, Depression, Dizziness, Inability to control bladder (urine), Numbness, Spasms, Pain that wakes patient up, and Pain that does not allow patient to sleep Quality of Pain: Aching, Agonizing, Constant, Punishing, and Sharp Previous Examinations or Tests: The patient denies any previous tests  [name] is being evaluated for possible interventional pain management therapies for the treatment of her chronic pain.   Discussed the use of AI scribe software for clinical note transcription with the patient, who gave verbal consent to proceed.  History of Present Illness   Brianna [name] is a 63 year old female with chronic pain syndrome who presents with left arm pain following fistula surgery.  She has been experiencing significant pain in her left arm since undergoing a nerve block and fistula surgery approximately one year ago, around the end of May or beginning of June. The pain is severe, primarily located in the left shoulder area,  and sometimes radiates down the arm to the hand. Movement exacerbates the pain.  She is currently on dialysis three times a week on Monday, Wednesday, and Friday. She has seen a vascular surgeon last month but did not have a positive experience.  For pain management, she has been prescribed hydrocodone , which she takes twice a day and finds helpful. However, she is unable to produce much urine and cannot eat again.  She also experiences occasional burning and tingling in her feet and has a history of diabetes. She is currently taking Zoloft for depression and anxiety and has a history of asthma.  No recent x-rays of her neck. Occasional burning and tingling in her feet and a history of diabetes.       Historic Controlled Substance Pharmacotherapy Review  Historical Monitoring: The patient  reports no history of drug use. List of prior UDS Testing: No results found for: MDMA, COCAINSCRNUR, PCPSCRNUR, PCPQUANT, CANNABQUANT, THCU, ETH, CBDTHCR, D8THCCBX, D9THCCBX Historical Background Evaluation: [place] PMP: PDMP not reviewed this encounter. Review of the past 78-months conducted.              [place] Department of public safety, offender search: Engineer, mining Information) Non-contributory Risk Assessment Profile: Aberrant behavior: None observed or detected today Risk factors for fatal opioid overdose: None identified today Fatal overdose hazard ratio (HR): Calculation deferred Non-fatal overdose hazard ratio (HR): Calculation deferred Risk of opioid abuse or dependence: 0.7-3.0% with doses <= 36 MME/day and 6.1-26% with doses >= 120 MME/day. Substance use disorder (SUD) risk level: See below Personal History of Substance Abuse (SUD-Substance use disorder):  Alcohol: Negative  Illegal Drugs: Negative  Rx Drugs: Negative  ORT Risk Level calculation: Low Risk  Opioid Risk Tool - 07/14/24 1032       Family History of Substance Abuse   Alcohol Negative    Illegal Drugs Negative     Rx Drugs Negative      Personal History of Substance Abuse   Alcohol Negative    Illegal Drugs Negative    Rx Drugs Negative      Age   Age between 16-45 years  No      History of Preadolescent Sexual Abuse   History of Preadolescent Sexual Abuse Negative or Female      Psychological Disease   Psychological Disease Negative    Depression Positive      Total Score   Opioid Risk Tool Scoring 1    Opioid Risk Interpretation Low Risk         ORT Scoring interpretation table:  Score <3 = Low Risk for SUD  Score between 4-7 = Moderate Risk for SUD  Score >8 = High Risk for Opioid Abuse   PHQ-2 Depression Scale:  Total score: 0  PHQ-2 Scoring interpretation table: (Score and probability of major depressive disorder)  Score 0 = No depression  Score 1 = 15.4% Probability  Score 2 = 21.1% Probability  Score 3 = 38.4% Probability  Score 4 = 45.5% Probability  Score 5 = 56.4% Probability  Score 6 = 78.6% Probability   PHQ-9 Depression Scale:  Total score: 0  PHQ-9 Scoring interpretation table:  Score 0-4 = No depression  Score 5-9 = Mild depression  Score 10-14 = Moderate depression  Score 15-19 = Moderately severe depression  Score 20-27 = Severe depression (2.4 times higher risk of SUD and 2.89 times higher risk of overuse)   Pharmacologic Plan: As per protocol, I have not taken over any controlled substance management, pending the results of ordered tests and/or consults.  Initial impression: Pending review of available data and ordered tests.  Meds   Current Outpatient Medications:    albuterol  (PROVENTIL ) (2.5 MG/3ML) 0.083% nebulizer solution, Take 2.5 mg by nebulization every 4 (four) hours as needed for shortness of breath or wheezing., Disp: , Rfl:    albuterol  (VENTOLIN  HFA) 108 (90 Base) MCG/ACT inhaler, Inhale 1-2 puffs into the lungs every 6 (six) hours as needed for wheezing or shortness of breath., Disp: , Rfl:    allopurinol  (ZYLOPRIM ) 300 MG  tablet, Take 300 mg by mouth every morning., Disp: , Rfl:    amLODipine  (NORVASC ) 5 MG tablet, Take 5 mg by mouth daily., Disp: , Rfl:    ASPIRIN  ADULT LOW STRENGTH 81 MG EC tablet, Take 81 mg by mouth daily., Disp: , Rfl:    carvedilol  (COREG ) 12.5 MG tablet, Take 1 tablet (12.5 mg total) by mouth 2 (two) times daily before a meal., Disp: 60 tablet, Rfl: 0   cetirizine (ZYRTEC) 10 MG tablet, Take 10 mg by mouth every morning., Disp: , Rfl:    cetirizine-pseudoephedrine (ZYRTEC-D) 5-120 MG tablet, Take 1 tablet by mouth 2 (two) times daily., Disp: , Rfl:    colchicine 0.6 MG tablet, Take by mouth., Disp: , Rfl:    cyanocobalamin  (VITAMIN B12) 1000 MCG tablet, Take 1 tablet (1,000 mcg total) by mouth daily., Disp: 15 tablet, Rfl: 0   FEROSUL 325 (65 Fe) MG tablet, Take 325 mg by mouth every other day., Disp: , Rfl:    furosemide  (LASIX ) 80 MG tablet, Take 1 tablet (80 mg total) by mouth daily. For leg swelling or weight gain >5lbs (Patient taking differently: Take 80 mg by mouth every morning. For leg swelling or weight gain >5lbs), Disp: 30 tablet, Rfl: 2   hydrALAZINE  (APRESOLINE ) 50 MG tablet, Take 1 tablet (50 mg total) by mouth every 8 (eight) hours., Disp: 90 tablet, Rfl: 0   HYDROcodone -acetaminophen  (NORCO/VICODIN) 5-325 MG tablet, Take 1 tablet by mouth every 6 (six) hours as needed for moderate pain (pain score 4-6)., Disp: 20 tablet, Rfl: 0   hydrOXYzine (ATARAX) 50 MG tablet, Take 50 mg by mouth daily., Disp: , Rfl:    isosorbide  mononitrate (IMDUR ) 30 MG 24 hr tablet, Take 1 tablet (30 mg total) by mouth 2 (two) times daily., Disp: 60 tablet, Rfl: 0   JANUVIA 100 MG tablet, Take 100 mg by mouth every morning., Disp: , Rfl:    lactulose  (CHRONULAC ) 10 GM/15ML solution, SMARTSIG:Milliliter(s) By Mouth, Disp: , Rfl:    omeprazole (PRILOSEC) 20 MG capsule, Take 20 mg by mouth every morning., Disp: , Rfl:    OZEMPIC, 0.25 OR 0.5 MG/DOSE, 2 MG/3ML SOPN, , Disp: , Rfl:    sertraline (ZOLOFT)  25 MG tablet, Take 25 mg by mouth daily., Disp: , Rfl:    sevelamer carbonate (RENVELA) 800 MG tablet, Take 1,600 mg by mouth 3 (three) times daily., Disp: , Rfl:    triamcinolone ointment (KENALOG) 0.5 %, Apply 1 Application topically 2 (two) times daily., Disp: , Rfl:    Vitamin D , Ergocalciferol , (DRISDOL) 1.25 MG (50000 UNIT) CAPS capsule, Take 50,000 Units by mouth once a week., Disp: , Rfl:    ZETIA 10 MG tablet, Take 10 mg by mouth every morning., Disp: , Rfl:    zolpidem  (AMBIEN ) 5 MG tablet, Take 1 tablet (5 mg total) by mouth at bedtime as needed for sleep., Disp: 5 tablet, Rfl: 0  Imaging Review   ROS  Cardiovascular: High blood pressure Pulmonary or  Respiratory: Wheezing and difficulty taking a deep full breath (Asthma), Shortness of breath, and Temporary stoppage of breathing during sleep Neurological: No reported neurological signs or symptoms such as seizures, abnormal skin sensations, urinary and/or fecal incontinence, being born with an abnormal open spine and/or a tethered spinal cord Psychological-Psychiatric: Anxiousness and Depressed Gastrointestinal: Alternating episodes iof diarrhea and constipation (IBS-Irritable bowe syndrome) and Irregular, infrequent bowel movements (Constipation) Genitourinary: No reported renal or genitourinary signs or symptoms such as difficulty voiding or producing urine, peeing blood, non-functioning kidney, kidney stones, difficulty emptying the bladder, difficulty controlling the flow of urine, or chronic kidney disease Hematological: No reported hematological signs or symptoms such as prolonged bleeding, low or poor functioning platelets, bruising or bleeding easily, hereditary bleeding problems, low energy levels due to low hemoglobin or being anemic Endocrine: No reported endocrine signs or symptoms such as high or low blood sugar, rapid heart rate due to high thyroid levels, obesity or weight gain due to slow thyroid or thyroid  disease Rheumatologic: No reported rheumatological signs and symptoms such as fatigue, joint pain, tenderness, swelling, redness, heat, stiffness, decreased range of motion, with or without associated rash Musculoskeletal: Negative for myasthenia gravis, muscular dystrophy, multiple sclerosis or malignant hyperthermia Work History: Disabled  Allergies  Ms. Printup is allergic to ace inhibitors, lisinopril, and lipitor [atorvastatin].  Laboratory Chemistry Profile   Renal Lab Results  Component Value Date   BUN 20 10/23/2023   CREATININE 3.03 (H) 10/23/2023   LABCREA 40 05/23/2023   GFRAA 26 (L) 02/14/2020   GFRNONAA 17 (L) 10/23/2023   PROTEINUR >=300 (A) 05/28/2023     Electrolytes Lab Results  Component Value Date   NA 135 10/23/2023   K 2.5 (LL) 10/23/2023   CL 96 (L) 10/23/2023   CALCIUM 9.0 10/23/2023   MG 1.6 (L) 05/31/2023   PHOS 4.6 05/31/2023     Hepatic Lab Results  Component Value Date   AST 16 10/23/2023   ALT 10 10/23/2023   ALBUMIN 3.9 10/23/2023   ALKPHOS 86 10/23/2023   LIPASE 19 02/09/2020     ID Lab Results  Component Value Date   HIV Non Reactive 05/23/2023   SARSCOV2NAA NEGATIVE 01/12/2021   STAPHAUREUS POSITIVE (A) 05/27/2023   MRSAPCR NEGATIVE 05/27/2023   HCVAB NON REACTIVE 01/14/2021     Bone Lab Results  Component Value Date   VD25OH See Scanned report in [facility] 05/31/2023     Endocrine Lab Results  Component Value Date   GLUCOSE 146 (H) 10/23/2023   GLUCOSEU 50 (A) 05/28/2023   HGBA1C 6.3 (H) 05/23/2023     Neuropathy Lab Results  Component Value Date   VITAMINB12 264 05/29/2023   HGBA1C 6.3 (H) 05/23/2023   HIV Non Reactive 05/23/2023     CNS No results found for: COLORCSF, APPEARCSF, RBCCOUNTCSF, WBCCSF, POLYSCSF, LYMPHSCSF, EOSCSF, PROTEINCSF, GLUCCSF, JCVIRUS, CSFOLI, IGGCSF, LABACHR, ACETBL   Inflammation (CRP: Acute  ESR: Chronic) Lab Results  Component Value Date    LATICACIDVEN 1.7 02/09/2020     Rheumatology Lab Results  Component Value Date   ANA Negative 01/14/2021     Coagulation Lab Results  Component Value Date   PLT 242 10/23/2023   DDIMER 0.88 (H) 05/23/2023     Cardiovascular Lab Results  Component Value Date   BNP 30.0 05/23/2023   TROPONINI < 0.02 04/22/2013   HGB 13.6 10/23/2023   HCT 41.3 10/23/2023     Screening Lab Results  Component Value Date   SARSCOV2NAA NEGATIVE  01/12/2021   STAPHAUREUS POSITIVE (A) 05/27/2023   MRSAPCR NEGATIVE 05/27/2023   HCVAB NON REACTIVE 01/14/2021   HIV Non Reactive 05/23/2023     Cancer No results found for: CEA, CA125, LABCA2   Allergens No results found for: ALMOND, APPLE, ASPARAGUS, AVOCADO, BANANA, BARLEY, BASIL, BAYLEAF, GREENBEAN, LIMABEAN, WHITEBEAN, BEEFIGE, REDBEET, BLUEBERRY, BROCCOLI, CABBAGE, MELON, CARROT, CASEIN, CASHEWNUT, CAULIFLOWER, CELERY     Note: Lab results reviewed.  PFSH  Drug: Ms. Gauthreaux  reports no history of drug use. Alcohol:  reports no history of alcohol use. Tobacco:  reports that she has never smoked. She has never used smokeless tobacco. Medical:  has a past medical history of Acid reflux, Anasarca, Anemia, Asthma, Bilateral leg edema, Bronchopneumonia, Chronic heart failure with preserved ejection fraction (HFpEF) (HCC), CKD (chronic kidney disease), stage IV (HCC), DM (diabetes mellitus), type 2 (HCC), Dyspnea, Hypertension, Hypokalemia, Morbid obesity (HCC), and Strangulated ventral hernia. Family: family history is not on file.  Past Surgical History:  Procedure Laterality Date   A/V FISTULAGRAM Left 01/28/2024   Procedure: A/V Fistulagram;  Surgeon: Jama Cordella MATSU, MD;  Location: [facility];  Service: Cardiovascular;  Laterality: Left;   A/V FISTULAGRAM Left 04/14/2024   Procedure: A/V Fistulagram;  Surgeon: Jama Cordella MATSU, MD;  Location: [facility];  Service:  Cardiovascular;  Laterality: Left;   AV FISTULA PLACEMENT Left 08/08/2023   Procedure: ARTERIOVENOUS (AV) FISTULA CREATION (RADIALCEPHALIC);  Surgeon: Marea Selinda RAMAN, MD;  Location: [facility];  Service: Vascular;  Laterality: Left;  regional with MAC   BOWEL RESECTION  02/09/2020   Procedure: SMALL BOWEL RESECTION;  Surgeon: Desiderio Schanz, MD;  Location: [facility];  Service: General;;   DIALYSIS/PERMA CATHETER INSERTION N/A 05/28/2023   Procedure: DIALYSIS/PERMA CATHETER INSERTION;  Surgeon: Jama Cordella MATSU, MD;  Location: [facility];  Service: Cardiovascular;  Laterality: N/A;   LAPAROTOMY N/A 02/09/2020   Procedure: EXPLORATORY LAPAROTOMY;  Surgeon: Desiderio Schanz, MD;  Location: [facility];  Service: General;  Laterality: N/A;   Active Ambulatory Problems    Diagnosis Date Noted   Strangulated ventral hernia 02/09/2020   Type 2 diabetes mellitus without complication, with long-term current use of insulin  (HCC)    Acute renal failure superimposed on stage 4 chronic kidney disease (HCC)    Hypertension    Bronchopneumonia 01/12/2021   Type II diabetes mellitus with renal manifestations (HCC) 01/12/2021   Acid reflux    Asthma    Anemia due to stage 4 chronic kidney disease (HCC)    Hypokalemia    Dyspnea 01/18/2021   Bilateral lower extremity edema 01/18/2021   Volume overload 01/18/2021   Anasarca 05/23/2023   (HFpEF) heart failure with preserved ejection fraction (HCC) 05/23/2023   Acute on chronic congestive heart failure (HCC) 05/24/2023   End stage renal disease (HCC) 05/23/2023   Morbidly obese (HCC) 05/29/2023   Acute on chronic diastolic CHF (congestive heart failure) (HCC) 05/29/2023   Anemia of chronic kidney failure 05/29/2023   Chronic pain syndrome 07/14/2024   Chronic painful diabetic neuropathy (HCC) 07/14/2024   Anxiety and depression 07/14/2024   Resolved Ambulatory Problems    Diagnosis Date Noted   No Resolved Ambulatory Problems   Past Medical History:   Diagnosis Date   Anemia    Bilateral leg edema    Chronic heart failure with preserved ejection fraction (HFpEF) (HCC)    CKD (chronic kidney disease), stage IV (HCC)    DM (diabetes mellitus), type 2 (HCC)  Morbid obesity (HCC)    Constitutional Exam  General appearance: Well nourished, well developed, and well hydrated. In no apparent acute distress Vitals:   07/14/24 1029  BP: 136/64  Pulse: 73  Resp: 18  Temp: 98.7 F (37.1 C)  TempSrc: Temporal  SpO2: 97%  Weight: 184 lb (83.5 kg)  Height: 4' 9 (1.448 m)   BMI Assessment: Estimated body mass index is 39.82 kg/m as calculated from the following:   Height as of this encounter: 4' 9 (1.448 m).   Weight as of this encounter: 184 lb (83.5 kg).  BMI interpretation table: BMI level Category Range association with higher incidence of chronic pain  <18 kg/m2 Underweight   18.5-24.9 kg/m2 Ideal body weight   25-29.9 kg/m2 Overweight Increased incidence by 20%  30-34.9 kg/m2 Obese (Class I) Increased incidence by 68%  35-39.9 kg/m2 Severe obesity (Class II) Increased incidence by 136%  >40 kg/m2 Extreme obesity (Class III) Increased incidence by 254%   Patient's current BMI Ideal Body weight  Body mass index is 39.82 kg/m. Patient must be at least 60 in tall to calculate ideal body weight   BMI Readings from Last 4 Encounters:  07/14/24 39.82 kg/m  06/25/24 39.95 kg/m  04/14/24 39.60 kg/m  03/24/24 38.30 kg/m   Wt Readings from Last 4 Encounters:  07/14/24 184 lb (83.5 kg)  06/25/24 184 lb 9.6 oz (83.7 kg)  04/14/24 183 lb (83 kg)  03/24/24 177 lb (80.3 kg)    Psych/Mental status: Alert, oriented x 3 (person, place, & time)       Eyes: PERLA Respiratory: No evidence of acute respiratory distress  Left shoulder and left arm pain  Assessment  Primary Diagnosis & Pertinent Problem List: The primary encounter diagnosis was Chronic pain syndrome. Diagnoses of Chronic painful diabetic neuropathy (HCC) and  Anxiety and depression were also pertinent to this visit.  Visit Diagnosis (New problems to examiner): 1. Chronic pain syndrome   2. Chronic painful diabetic neuropathy (HCC)   3. Anxiety and depression    Plan of Care (Initial workup plan)  General Recommendations: The pain condition that the patient suffers from is best treated with a multidisciplinary approach that involves an increase in physical activity to prevent de-conditioning and worsening of the pain cycle, as well as psychological counseling (formal and/or informal) to address the co-morbid psychological affects of pain. Treatment will often involve judicious use of pain medications and interventional procedures to decrease the pain, allowing the patient to participate in the physical activity that will ultimately produce long-lasting pain reductions. The goal of the multidisciplinary approach is to return the patient to a higher level of overall function and to restore their ability to perform activities of daily living.   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 Pain is localized to the left shoulder and radiates to the hand, likely due to the procedure. Improvement is expected but will be slow. Further nerve blocks should be avoided due to current inflammation. Obtain a baseline urine screen for medication management and schedule a follow-up with the nurse practitioner for a pain contract and hydrocodone  prescription.  Chronic pain syndrome   Chronic pain syndrome is related to the nerve injury in the left arm. Hydrocodone , taken twice daily, effectively manages the pain. Transition of pain management to this clinic is planned, with hydrocodone  prescription contingent upon urine  screen results. Prescribe hydrocodone  after urine screen results are available and schedule a follow-up with the nurse practitioner for a pain  contract.  Discussed hydrocodone  5 mg twice daily as needed  End-stage renal disease on dialysis   End-stage renal disease is managed with dialysis on Monday, Wednesday, and Friday.  Diabetes mellitus with painful diabetic neuropathic pain of bilateral feet Diabetes mellitus presents with occasional burning and tingling in the feet, suggestive of diabetic neuropathy. Qutenza treatment for neuropathic foot pain was discussed and agreed upon. Qutenza is a topical treatment applied in the clinic for 30 minutes. Schedule Qutenza treatment in 2-3 weeks.  Depression and anxiety   Depression and anxiety are managed with Zoloft.  Asthma   Asthma is a known condition.  Managed on albuterol        Lab Orders         Drug Screen 10 W/Conf, Serum     Procedure Orders         NEUROLYSIS     Provider-requested follow-up: Return in about 9 days (around 07/23/2024) for Emmy Blanch, MM .  Future Appointments  Date Time Provider Department Center  07/23/2024 10:00 AM Patel, Seema K, NP [facility] None  08/20/2024  1:00 PM [facility] None  08/20/2024  2:15 PM Schnier, Cordella MATSU, MD [facility] None   I discussed the assessment and treatment plan with the patient. The patient was provided an opportunity to ask questions and all were answered. The patient agreed with the plan and demonstrated an understanding of the instructions.  Patient advised to call back or seek an in-person evaluation if the symptoms or condition worsens.  Duration of encounter: .  Total time on encounter, as per AMA guidelines included both the face-to-face and non-face-to-face time personally spent by the physician and/or other qualified health care professional(s) on the day of the encounter (includes time in activities that require the physician or other qualified health care professional and does not include time in activities normally performed by clinical staff). Physician's time may include the following  activities when performed: Preparing to see the patient (e.g., pre-charting review of records, searching for previously ordered imaging, lab work, and nerve conduction tests) Review of prior analgesic pharmacotherapies. Reviewing PMP Interpreting ordered tests (e.g., lab work, imaging, nerve conduction tests) Performing post-procedure evaluations, including interpretation of diagnostic procedures Obtaining and/or reviewing separately obtained history Performing a medically appropriate examination and/or evaluation Counseling and educating the patient/family/caregiver Ordering medications, tests, or procedures Referring and communicating with other health care professionals (when not separately reported) Documenting clinical information in the electronic or other health record Independently interpreting results (not separately reported) and communicating results to the patient/ family/caregiver Care coordination (not separately reported)  Note by: Wallie Sherry, MD (TTS and AI technology used. I apologize for any typographical errors that were not detected and corrected.) Date: 07/14/2024; Time: 11:21 AM

## 2024-07-18 LAB — DRUG SCREEN 10 W/CONF, SERUM
Amphetamines, IA: NEGATIVE ng/mL
Barbiturates, IA: NEGATIVE ug/mL
Benzodiazepines, IA: NEGATIVE ng/mL
Cocaine & Metabolite, IA: NEGATIVE ng/mL
Methadone, IA: NEGATIVE ng/mL
Opiates, IA: NEGATIVE ng/mL
Oxycodones, IA: NEGATIVE ng/mL
Phencyclidine, IA: NEGATIVE ng/mL
Propoxyphene, IA: NEGATIVE ng/mL
THC(Marijuana) Metabolite, IA: NEGATIVE ng/mL

## 2024-07-21 NOTE — Progress Notes (Signed)
 DIVISION OF PULMONARY AND CRITICAL CARE MEDICINE                                                            Routine f/u PATIENT ENCOUNTER                                         Chief Complaint: Asthma  History of Present Illness Brianna [name] is a 63 year old female with asthma and obstructive sleep apnea who presents for a follow-up regarding her CPAP use and respiratory status. She was initially referred for evaluation of her sleep apnea and asthma management.  She uses her CPAP machine nightly with good compliance. She is not currently using her inhalers and has not needed albuterol  recently. She experiences some chest tightness but no wheezing or shortness of breath. She tolerates heat and pollen well by staying indoors to avoid triggers.  She has a history of pneumonia twice in one year. Mild basilar scarring was noted on a chest x-ray two years ago. She smoked for about five years in her late twenties to early thirties but has since quit.  PFT was completed and reviewed  Past medical History: She  has no past medical history on file.   Allergies:  is allergic to ace inhibitors and atorvastatin.  ROS: 10 point review of systems has been conducted during interview and evaluation today. Remainder has been reviewed and is negative except as per HPI  Current Medications (including changes made at this visit) Current Outpatient Medications  Medication Sig Dispense Refill  . albuterol  (PROVENTIL ) 2.5 mg /3 mL (0.083 %) nebulizer solution Take 3 mLs (2.5 mg total) by nebulization every 6 (six) hours as needed for Wheezing 75 mL 1  . albuterol  MDI, PROVENTIL , VENTOLIN , PROAIR , HFA 90 mcg/actuation inhaler Inhale 1-2 Inhalations into the lungs every 6 (six) hours as needed    . allopurinoL  (ZYLOPRIM ) 100 MG tablet     . amLODIPine  (NORVASC ) 10 MG tablet     . aspirin  81 MG EC tablet     . carvediloL  (COREG ) 12.5 MG tablet Take  12.5 mg by mouth 2 (two) times daily with meals    . cetirizine (ZYRTEC) 10 MG tablet     . cyanocobalamin  (VITAMIN B12) 1000 MCG tablet Take 1,000 mcg by mouth once daily    . ezetimibe (ZETIA) 10 mg tablet Take 10 mg by mouth every morning    . ferrous sulfate 325 (65 FE) MG tablet Take 325 mg by mouth every other day    . FUROsemide  (LASIX ) 80 MG tablet Take 80 mg by mouth once daily    . hydrALAZINE  (APRESOLINE ) 50 MG tablet Take 50 mg by mouth 3 (three) times daily    . hydroCHLOROthiazide (HYDRODIURIL) 25 MG tablet     . isosorbide  mononitrate (IMDUR ) 30 MG ER tablet Take 30 mg by mouth 2 (two) times daily    . polyethylene glycol (MIRALAX ) packet Take 17 g by  mouth once daily    . rosuvastatin (CRESTOR) 40 MG tablet TAKE 1 TABLET BY MOUTH ONCE A DAY 90 tablet 0  . SITagliptin phosphate (JANUVIA) 100 MG tablet Take 100 mg by mouth every morning    . triamcinolone 0.5 % ointment Apply 1 Application topically 2 (two) times daily    . zolpidem  (AMBIEN ) 5 MG tablet Take 5 mg by mouth at bedtime as needed     No current facility-administered medications for this visit.      Social History:  She  reports that she has never smoked. She has never used smokeless tobacco. She reports that she does not drink alcohol and does not use drugs.  Surgical History:  has a past surgical history that includes AV Fistulagram (Left, 04/14/2024).  Family History: Her family history is not on file.  Physical Exam: BP 94/54   Pulse 78   Ht 144.8 cm (4' 9)   Wt 83.9 kg (185 lb)   SpO2 97%   BMI 40.03 kg/m  GENERAL: NAD, able to speak in complete sentences without dyspnea or cough HEENT: normocephalic, PERRL, EOMI, TM with sharp light reflex bilaterally.  Normal external auditory canal. No hypertrophy of nasal turbinates.  Clear mucosa in mouth without any exudates or erythema NECK: Supple, no jugular venous distension, nodes, stridor nor thyromegaly. Trachea midline.  CARDIOVASCULAR: RRR, no  murmurs, gallops, rubs RESPIRATORY: normal respiratory effort, clear to auscultation bilaterally. No crackles, or wheezes GASTROINTESTINAL: NABS, soft, non tender, non distended EXTR: No edema, homans sign or cyanosis LYMPHATIC: No nodes in neck or supraclavicular areas INTEGUMENTARY: No rashes, bruising, telantagias, sclerodactaly nor  alopecia NEURO:  Cranial nerves, motor, sensation intact. Normal gait  Physical Exam MEASUREMENTS: Weight- 185. GENERAL: Alert, cooperative, well developed, no acute distress. HEENT: Normocephalic, normal oropharynx, moist mucous membranes. CHEST: Clear to auscultation bilaterally, no wheezes, mild tightness noted. CARDIOVASCULAR: Normal heart rate and rhythm, S1 and S2 normal without murmurs. ABDOMEN: Soft, non-tender, non-distended, without organomegaly, normal bowel sounds. EXTREMITIES: No cyanosis or edema. NEUROLOGICAL: Cranial nerves grossly intact, moves all extremities without gross motor or sensory deficit.  Labs & Imaging:   07/21/2024:  PFT  fev1/fvc 111%, FVC 67% 1.35L, FEV1 74%,     MedicalDecision Making:   Patient has 1 or more acute or chronic illness or injury that poses a threat to life or bodily function. Review of prior external notes including review of results of blood work and imaging was performed today. Additional tests have been ordered, I have independently reviewed the chart and including microbiology, chemistry, imaging and procedures.      Impression/Plan:   1. Mild intermittent asthma without complication (HHS-HCC)  2. OSA  3. Morbidly obese (CMS/HHS-HCC)      Assessment & Plan Mild intermittent Asthma Asthma well-controlled, no recent albuterol  use, good lung function despite potential restriction from weight or past pneumonia scarring. PFT has mixed presentation with decreased FVC of 67% and DLCO of 69% - PFT completed and reviewed - Prescribe Duoneb for nebulizer as needed. - Refill albuterol  inhaler. -  Instruct to contact clinic if increased dyspnea or pulmonary issues.  Obstructive Sleep Apnea Obstructive sleep apnea well-managed with CPAP, good compliance, no recent issues. - Order sleep compliance report for CPAP. - Schedule follow-up in 6 months for sleep apnea and asthma.  End-Stage Renal Disease on Hemodialysis Managed with hemodialysis, weight management crucial to prevent fluid overload affecting respiratory status. - Continue current dialysis schedule. - Encourage weight management to prevent fluid overload.  Morbid Obesity - BMI 40 Weight management with decreased weight could improve sleep apnea.   Continue ozempic at this time with appropriate titration recommended for optimal weight loss.       PLAN: 6 month f/u for Cpap compliance Evaluation Asthma    Patient relates understanding our goals for this visit and is agreeable to above plans.    Thank you for allowing me to participate in the care of this patient.   This document was prepared using Dragon voice recognition software and may include unintentional dictation errors. This note has been created using automated tools and reviewed for accuracy by [facility].    Attestation Statement:   I personally performed the service, non-incident to. (WP)   DEBBIE MARIE BLITCH, NP

## 2024-07-23 ENCOUNTER — Encounter: Payer: Self-pay | Admitting: Nurse Practitioner

## 2024-07-23 ENCOUNTER — Ambulatory Visit
Admission: RE | Admit: 2024-07-23 | Discharge: 2024-07-23 | Disposition: A | Discharge status: Home / Self Care | Source: Ambulatory Visit | Attending: Nurse Practitioner | Admitting: Nurse Practitioner

## 2024-07-23 ENCOUNTER — Ambulatory Visit (HOSPITAL_BASED_OUTPATIENT_CLINIC_OR_DEPARTMENT_OTHER): Admitting: Nurse Practitioner

## 2024-07-23 VITALS — BP 115/72 | HR 76 | Temp 97.6°F | Resp 16 | Ht <= 58 in | Wt 184.0 lb

## 2024-07-23 DIAGNOSIS — Z0289 Encounter for other administrative examinations: Secondary | ICD-10-CM | POA: Diagnosis present

## 2024-07-23 DIAGNOSIS — F32A Depression, unspecified: Secondary | ICD-10-CM | POA: Diagnosis present

## 2024-07-23 DIAGNOSIS — Z79899 Other long term (current) drug therapy: Secondary | ICD-10-CM

## 2024-07-23 DIAGNOSIS — G894 Chronic pain syndrome: Secondary | ICD-10-CM

## 2024-07-23 DIAGNOSIS — G8929 Other chronic pain: Secondary | ICD-10-CM

## 2024-07-23 DIAGNOSIS — Z7984 Long term (current) use of oral hypoglycemic drugs: Secondary | ICD-10-CM

## 2024-07-23 DIAGNOSIS — F419 Anxiety disorder, unspecified: Secondary | ICD-10-CM | POA: Insufficient documentation

## 2024-07-23 DIAGNOSIS — E1140 Type 2 diabetes mellitus with diabetic neuropathy, unspecified: Secondary | ICD-10-CM

## 2024-07-23 DIAGNOSIS — M25562 Pain in left knee: Secondary | ICD-10-CM | POA: Diagnosis present

## 2024-07-23 DIAGNOSIS — Z7189 Other specified counseling: Secondary | ICD-10-CM | POA: Insufficient documentation

## 2024-07-23 DIAGNOSIS — M25561 Pain in right knee: Secondary | ICD-10-CM | POA: Insufficient documentation

## 2024-07-23 MED ORDER — HYDROCODONE-ACETAMINOPHEN 5-325 MG PO TABS: 1.0000 | ORAL_TABLET | Freq: Two times a day (BID) | ORAL | 0 refills | Status: DC | PRN

## 2024-07-23 NOTE — Progress Notes (Signed)
Nursing Pain Medication Assessment:  Safety precautions to be maintained throughout the outpatient stay will include: orient to surroundings, keep bed in low position, maintain call bell within reach at all times, provide assistance with transfer out of bed and ambulation.

## 2024-07-23 NOTE — Progress Notes (Signed)
 PROVIDER NOTE: Interpretation of information contained herein should be left to medically-trained personnel. Specific patient instructions are provided elsewhere under Patient Instructions section of medical record. This document was created in part using AI and STT-dictation technology, any transcriptional errors that may result from this process are unintentional.  Patient: Brianna [name]  Service: E/M   PCP: Brianna Maxie LABOR, MD  DOB: [birth year]  DOS: 07/23/2024  Provider: Emmy MARLA Blanch, NP  MRN: [id number]  Delivery: Face-to-face  Specialty: Interventional Pain Management  Type: Established Patient  Setting: Ambulatory outpatient facility  Specialty designation: 09  Referring Prov.: Brianna Maxie LABOR, MD  Location: Outpatient office facility       History of present illness (HPI) [name], a 63 y.o. year old female, is here today because of her Bilateral chronic knee pain [M25.561, M25.562, G89.29]. [name] primary complain today is Shoulder Pain (Left ), Arm Pain (Left), and Knee Pain (Bilateral )  Pertinent problems: [name] has Chronic painful diabetic neuropathy (HCC); Type II diabetes mellitus with renal manifestations (HCC); Morbidly obese (HCC); Chronic knee pain (Bilateral), and Chronic pain syndrome on their pertinent problem list.   Pain Assessment: Severity of Chronic pain is reported as a 10-Worst pain ever/10. Location: Shoulder Left/Radaites from left arm to the hand, with numbness in the hand. Onset: More than a month ago. Quality: Constant, Sharp, Shooting, Aching. Timing: Constant. Modifying factor(s): Laying flat in back, tylenol , and pain medication. Vitals:  height is 4' 9 (1.448 m) and weight is 184 lb (83.5 kg). Her temporal temperature is 97.6 F (36.4 C). Her blood pressure is 115/72 and her pulse is 76. Her respiration is 16 and oxygen saturation is 98%.  BMI: Estimated body mass index is 39.82 kg/m as calculated from the following:   Height  as of this encounter: 4' 9 (1.448 m).   Weight as of this encounter: 184 lb (83.5 kg).  Last encounter: 07/14/2024 Last procedure: Visit date not found.  Reason for encounter: medication management. No change in medical history since last visit.  Patient's pain is at baseline.  Patient continues multimodal pain regimen as prescribed.  States that it provides pain relief and improvement in functional status.  The patient complains of bilateral knee pain that began 3 years ago.  She reports difficulty standing for prolonged.  And is unable to walk without assistance of walker.  We discussed obtaining knee x-rays and proceeding with the gel injections, given the patient's history of diabetes and current use of insulin .  The patient agreed with the plan.   Pharmacotherapy Assessment   Hydrocodone -acetaminophen  (Norco) 5-325 mg tablet BID as needed for pain.  Monitoring: [place] PMP: PDMP reviewed during this encounter.       Pharmacotherapy: No side-effects or adverse reactions reported. Compliance: No problems identified. Effectiveness: Clinically acceptable.  Brianna Norris, RN  07/23/2024  9:48 AM  Sign when Signing Visit Nursing Pain Medication Assessment:  Safety precautions to be maintained throughout the outpatient stay will include: orient to surroundings, keep bed in low position, maintain call bell within reach at all times, provide assistance with transfer out of bed and ambulation.     UDS:  No results found for: SUMMARY  No results found for: CBDTHCR No results found for: D8THCCBX No results found for: D9THCCBX  ROS  Constitutional: Denies any fever or chills Gastrointestinal: No reported hemesis, hematochezia, vomiting, or acute GI distress Musculoskeletal: Shoulder Pain (Left ), Arm Pain (Left), and Knee Pain (Bilateral ) Neurological: No  reported episodes of acute onset apraxia, aphasia, dysarthria, agnosia, amnesia, paralysis, loss of coordination, or loss of  consciousness  Medication Review  HYDROcodone -acetaminophen , Semaglutide(0.25 or 0.5MG /DOS), Vitamin D  (Ergocalciferol ), albuterol , allopurinol , amLODipine , aspirin  EC, carvedilol , cetirizine, cetirizine-pseudoephedrine, colchicine, cyanocobalamin , ezetimibe, ferrous sulfate, furosemide , hydrALAZINE , hydrOXYzine, isosorbide  mononitrate, lactulose , omeprazole, sertraline, sevelamer carbonate, sitaGLIPtin, triamcinolone ointment, and zolpidem   History Review  Allergy: [name] is allergic to ace inhibitors, lisinopril, and lipitor [atorvastatin]. Drug: [name]  reports no history of drug use. Alcohol:  reports no history of alcohol use. Tobacco:  reports that she has never smoked. She has never used smokeless tobacco. Social: [name]  reports that she has never smoked. She has never used smokeless tobacco. She reports that she does not drink alcohol and does not use drugs. Medical:  has a past medical history of Acid reflux, Anasarca, Anemia, Asthma, Bilateral leg edema, Bronchopneumonia, Chronic heart failure with preserved ejection fraction (HFpEF) (HCC), CKD (chronic kidney disease), stage IV (HCC), DM (diabetes mellitus), type 2 (HCC), Dyspnea, Hypertension, Hypokalemia, Morbid obesity (HCC), and Strangulated ventral hernia. Surgical: Ms. Hilgeman  has a past surgical history that includes laparotomy (N/A, 02/09/2020); Bowel resection (02/09/2020); DIALYSIS/PERMA CATHETER INSERTION (N/A, 05/28/2023); AV fistula placement (Left, 08/08/2023); A/V Fistulagram (Left, 01/28/2024); and A/V Fistulagram (Left, 04/14/2024). Family: family history is not on file.  Laboratory Chemistry Profile   Renal Lab Results  Component Value Date   BUN 20 10/23/2023   CREATININE 3.03 (H) 10/23/2023   LABCREA 40 05/23/2023   GFRAA 26 (L) 02/14/2020   GFRNONAA 17 (L) 10/23/2023    Hepatic Lab Results  Component Value Date   AST 16 10/23/2023   ALT 10 10/23/2023   ALBUMIN 3.9 10/23/2023   ALKPHOS 86  10/23/2023   HCVAB NON REACTIVE 01/14/2021   LIPASE 19 02/09/2020    Electrolytes Lab Results  Component Value Date   NA 135 10/23/2023   [name] 2.5 (LL) 10/23/2023   CL 96 (L) 10/23/2023   CALCIUM 9.0 10/23/2023   MG 1.6 (L) 05/31/2023   PHOS 4.6 05/31/2023    Bone Lab Results  Component Value Date   VD25OH See Scanned report in  Link 05/31/2023    Inflammation (CRP: Acute Phase) (ESR: Chronic Phase) Lab Results  Component Value Date   LATICACIDVEN 1.7 02/09/2020         Note: Above Lab results reviewed.  Recent Imaging Review  VAS US  DUPLEX DIALYSIS ACCESS (AVF, AVG) DIALYSIS ACCESS  Patient Name:  Brianna [name]  Date of Exam:   06/04/2024 Medical Rec #: [id number]            Accession #:    [id number] Date of Birth: March 09, 1961            Patient Gender: F Patient Age:   57 years Exam Location:  [facility] Procedure:      VAS US  DUPLEX DIALYSIS ACCESS (AVF, AVG) Referring Phys: CORDELLA SHAWL  --------------------------------------------------------------------------------   Reason for Exam: Post intervention.  Access Site: Left Upper Extremity.  Access Type: Radial-cephalic AVF.  History: 04/14/2024 Percutaneous transluminal angioplasty anastomosis left arm          radiocephalic fistula          08/08/2023: Left Radiocephalic AVF placement          01/28/2024 Percutaneous transluminal angioplasty of the anastomosis left          radiocephalic fistula to 4 mm.  Comparison Study: 03/19/2024  Performing Technologist: Jerel Croak RVT  Examination Guidelines: A complete evaluation includes B-mode imaging, spectral Doppler, color Doppler, and power Doppler as needed of all accessible portions of each vessel. Unilateral testing is considered an integral part of a complete examination. Limited examinations for reoccurring indications may be performed as noted.     Findings: +--------------------+----------+-----------------+--------+ AVF                 PSV (cm/s)Flow Vol (mL/min)Comments +--------------------+----------+-----------------+--------+ Native artery inflow   327           756                +--------------------+----------+-----------------+--------+ AVF Anastomosis        384                              +--------------------+----------+-----------------+--------+    +---------------+----------+-------------+----------+--------+ OUTFLOW VEIN   PSV (cm/s)Diameter (cm)Depth (cm)Describe +---------------+----------+-------------+----------+--------+ Subclavian vein    23                                    +---------------+----------+-------------+----------+--------+ Confluence        109                                    +---------------+----------+-------------+----------+--------+ Dist UA            37                                    +---------------+----------+-------------+----------+--------+ AC Fossa           32        0.62                        +---------------+----------+-------------+----------+--------+ Prox Forearm       94                                    +---------------+----------+-------------+----------+--------+ Mid Forearm       111                                    +---------------+----------+-------------+----------+--------+ Dist Forearm      302        0.34                        +---------------+----------+-------------+----------+--------+       Summary: Patent arteriovenous fistula.  Improved flow status post PTA of the anastomosis site.   *See table(s) above for measurements and observations.   Diagnosing physician: Cordella Shawl MD Electronically signed by Cordella Shawl MD on 06/05/2024 at 11:04:01 AM.      --------------------------------------------------------------------------------    Final   Note: Reviewed         Physical Exam  Vitals: BP 115/72 (Patient Position: Sitting, Cuff Size: Normal)   Pulse 76   Temp 97.6 F (36.4 C) (Temporal)   Resp 16   Ht 4' 9 (1.448 m)   Wt 184 lb (83.5 kg)   SpO2 98%   BMI 39.82 kg/m  BMI: Estimated body mass index is 39.82 kg/m  as calculated from the following:   Height as of this encounter: 4' 9 (1.448 m).   Weight as of this encounter: 184 lb (83.5 kg). Ideal: Patient must be at least 60 in tall to calculate ideal body weight General appearance: Well nourished, well developed, and well hydrated. In no apparent acute distress Mental status: Alert, oriented x 3 (person, place, & time)       Respiratory: No evidence of acute respiratory distress Eyes: PERLA  Musculoskeletal: Restricted range of motion with flexion and extension Prolong standing and weight bearing aggravate knee pain (Bilateral), use walker for functional stability.  Assessment   Diagnosis Status  1. Bilateral chronic knee pain   2. Chronic pain syndrome   3. Chronic painful diabetic neuropathy (HCC)   4. Anxiety and depression   5. Medication management   6. Pain management contract discussed   7. Pain management contract signed    Persistent Persistent Controlled   Updated Problems: Problem  Bilateral Chronic Knee Pain    Plan of Care  Problem-specific:  Assessment and Plan  Started hydrocodone -acetaminophen  5-25 mg tablet 2 times daily as needed for pain.  Pain contract discussed and completed.  Prescribing drug monitoring (PDMP) reviewed.  Urine drug screening (UDS) up-to-date.  Schedule follow-up in 30 days for medication management with Brianna [name].  Plan: Bilateral Knee X-ray Knee (Monovisc) injection and Qutenza treatment with Dr. Marcelino.   [name] has a current medication list which includes the following long-term medication(s): albuterol , albuterol , allopurinol , carvedilol , cetirizine, colchicine, ferosul, furosemide , hydralazine , isosorbide   mononitrate, omeprazole, sertraline, zetia, and zolpidem .  Pharmacotherapy (Medications Ordered): Meds ordered this encounter  Medications   HYDROcodone -acetaminophen  (NORCO/VICODIN) 5-325 MG tablet    Sig: Take 1 tablet by mouth 2 (two) times daily as needed for severe pain (pain score 7-10). Must last 30 days    Dispense:  60 tablet    Refill:  0    Chronic Pain: STOP Act (Not applicable) Fill 1 day early if closed on refill date. Avoid benzodiazepines within 8 hours of opioids   Orders:  Orders Placed This Encounter  Procedures   KNEE INJECTION    Indications: Knee arthralgia (pain) due to osteoarthritis (OA) Imaging: None (CPT-20610) Nursing: Please request pharmacy to have Gelsyn-3 (Hyaluronan) available in office.    Standing Status:   Future    Expected Date:   08/06/2024    Expiration Date:   10/23/2024    Scheduling Instructions:     Procedure: Knee injection Monovisc (Hyaluronan)     Treatment No.: 1     Level: Intra-articular     Laterality: Bilateral Knee     Sedation: Patient's choice.     Timeframe: in two (2) weeks     Total Hyaluronic acid (AKA hyaluronan or hyaluronate) per injection: 16.8 mg (8.40 mg/mL x 2mL). Series of 3 injections. (Weekly)    Where will this procedure be performed?:   [facility]   DG Knee Complete 4 Views Right    Standing Status:   Future    Number of Occurrences:   1    Expiration Date:   10/23/2024    Scheduling Instructions:     Please make sure that the patient understands that this needs to be done as soon as possible. Never have the patient do the imaging just before the next appointment. Inform patient that having the imaging done within the [facility] will expedite the availability of the results and will provide  imaging availability to the requesting physician. In addition inform the patient that the imaging order has an expiration date and will not be renewed if not done within the active period.    Reason for  Exam (SYMPTOM  OR DIAGNOSIS REQUIRED):   Right knee pain/arthralgia    Preferred imaging location?:   [facility]    Release to patient:   Immediate    Call Results- Best Contact Number?:   [phone] [facility] Knee Complete 4 Views Left    Standing Status:   Future    Number of Occurrences:   1    Expiration Date:   10/23/2024    Scheduling Instructions:     Please make sure that the patient understands that this needs to be done as soon as possible. Never have the patient do the imaging just before the next appointment. Inform patient that having the imaging done within the [facility] will expedite the availability of the results and will provide      imaging availability to the requesting physician. In addition inform the patient that the imaging order has an expiration date and will not be renewed if not done within the active period.    Reason for Exam (SYMPTOM  OR DIAGNOSIS REQUIRED):   Left knee pain/arthralgia    Preferred imaging location?:   [facility]    Release to patient:   Immediate    Call Results- Best Contact Number?:   [phone] [place] [facility]        Return in about 1 month (around 08/23/2024) for (F2F), (MM), Brianna Blanch NP.    Recent Visits Date Type Provider Dept  07/14/24 Office Visit Marcelino Nurse, MD [facility]  Showing recent visits within past 90 days and meeting all other requirements Today's Visits Date Type Provider Dept  07/23/24 Office Visit Brianna Mclaine K, NP [facility]  Showing today's visits and meeting all other requirements Future Appointments Date Type Provider Dept  08/05/24 Appointment Marcelino Nurse, MD [facility]  08/20/24 Appointment Brianna Vernet K, NP [facility]  Showing future appointments within next 90 days and meeting all other requirements  I discussed the  assessment and treatment plan with the patient. The patient was provided an opportunity to ask questions and all were answered. The patient agreed with the plan and demonstrated an understanding of the instructions.  Patient advised to call back or seek an in-person evaluation if the symptoms or condition worsens.  Duration of encounter: 30 minutes.  Total time on encounter, as per AMA guidelines included both the face-to-face and non-face-to-face time personally spent by the physician and/or other qualified health care professional(s) on the day of the encounter (includes time in activities that require the physician or other qualified health care professional and does not include time in activities normally performed by clinical staff). Physician's time may include the following activities when performed: Preparing to see the patient (e.g., pre-charting review of records, searching for previously ordered imaging, lab work, and nerve conduction tests) Review of prior analgesic pharmacotherapies. Reviewing PMP Interpreting ordered tests (e.g., lab work, imaging, nerve conduction tests) Performing post-procedure evaluations, including interpretation of diagnostic procedures Obtaining and/or reviewing separately obtained history Performing a medically appropriate examination and/or evaluation Counseling and educating the patient/family/caregiver Ordering medications, tests, or procedures Referring and communicating with other health care professionals (when not separately reported) Documenting clinical information in  the electronic or other health record Independently interpreting results (not separately reported) and communicating results to the patient/ family/caregiver Care coordination (not separately reported)  Note by: Nyelli Samara [name] Garey Alleva, NP (TTS and AI technology used. I apologize for any typographical errors that were not detected and corrected.) Date: 07/23/2024; Time: 10:39 AM

## 2024-07-27 ENCOUNTER — Telehealth: Payer: Self-pay | Admitting: Nurse Practitioner

## 2024-07-27 ENCOUNTER — Other Ambulatory Visit: Payer: Self-pay

## 2024-07-27 DIAGNOSIS — Z79899 Other long term (current) drug therapy: Secondary | ICD-10-CM

## 2024-07-27 DIAGNOSIS — G894 Chronic pain syndrome: Secondary | ICD-10-CM

## 2024-07-27 NOTE — Telephone Encounter (Signed)
 Telephone call from patient, returning our call about Pharmacy for Hydrocodone . Patient would like to use [facility].

## 2024-07-27 NOTE — Telephone Encounter (Signed)
 PT needs hydrocodone  to be send to [facility] [address] [facility]

## 2024-07-27 NOTE — Telephone Encounter (Signed)
 Attempted to call patient to ask why she needs Hydrocodone  sent to another pharmacy. No answer, unable to leave a message.

## 2024-07-27 NOTE — Telephone Encounter (Signed)
 Called [facility], spoke with pharmacist,  They cannot fill Hydrocodone  because of regulations regarding scheduled meds.  They need a referral in order to do that.   Cancelled the prescription, will ask patient what pharmacy she would like it sent to.

## 2024-07-28 ENCOUNTER — Telehealth: Payer: Self-pay | Admitting: Nurse Practitioner

## 2024-07-28 ENCOUNTER — Other Ambulatory Visit: Payer: Self-pay | Admitting: *Deleted

## 2024-07-28 DIAGNOSIS — Z79899 Other long term (current) drug therapy: Secondary | ICD-10-CM

## 2024-07-28 DIAGNOSIS — G894 Chronic pain syndrome: Secondary | ICD-10-CM

## 2024-07-28 MED ORDER — HYDROCODONE-ACETAMINOPHEN 5-325 MG PO TABS: 1.0000 | ORAL_TABLET | Freq: Two times a day (BID) | ORAL | 0 refills | Status: DC | PRN

## 2024-07-28 NOTE — Telephone Encounter (Signed)
 Patient states Brianna [name] would not fill this script. She would like it sent to [facility] please. Notify patient when she can pick up

## 2024-07-28 NOTE — Telephone Encounter (Signed)
 Rx request sent to [facility]

## 2024-07-28 NOTE — Telephone Encounter (Signed)
 Patient just called upset because the pharmacy her meds got sent to will not deliver them and she cannot drive to get them. Please call patient

## 2024-08-05 ENCOUNTER — Ambulatory Visit
Attending: Student in an Organized Health Care Education/Training Program | Admitting: Student in an Organized Health Care Education/Training Program

## 2024-08-05 ENCOUNTER — Encounter: Payer: Self-pay | Admitting: Student in an Organized Health Care Education/Training Program

## 2024-08-05 VITALS — BP 120/58 | HR 82 | Temp 97.3°F | Resp 20 | Ht <= 58 in | Wt 180.0 lb

## 2024-08-05 DIAGNOSIS — G894 Chronic pain syndrome: Secondary | ICD-10-CM | POA: Insufficient documentation

## 2024-08-05 DIAGNOSIS — E1140 Type 2 diabetes mellitus with diabetic neuropathy, unspecified: Secondary | ICD-10-CM

## 2024-08-05 DIAGNOSIS — M25562 Pain in left knee: Secondary | ICD-10-CM | POA: Insufficient documentation

## 2024-08-05 DIAGNOSIS — M25561 Pain in right knee: Secondary | ICD-10-CM | POA: Insufficient documentation

## 2024-08-05 DIAGNOSIS — G8929 Other chronic pain: Secondary | ICD-10-CM | POA: Diagnosis present

## 2024-08-05 DIAGNOSIS — M170 Bilateral primary osteoarthritis of knee: Secondary | ICD-10-CM | POA: Diagnosis present

## 2024-08-05 MED ORDER — HYALURONAN 88 MG/4ML IX SOSY
88.0000 mg | PREFILLED_SYRINGE | Freq: Once | INTRA_ARTICULAR | Status: AC
  Administered 2024-08-05: 88 mg via INTRA_ARTICULAR

## 2024-08-05 MED ORDER — LIDOCAINE HCL 2 % IJ SOLN
20.0000 mL | Freq: Once | INTRAMUSCULAR | Status: AC
  Administered 2024-08-05: 400 mg
  Filled 2024-08-05: qty 20

## 2024-08-05 MED ORDER — CAPSAICIN-CLEANSING GEL 8 % EX KIT
4.0000 | PACK | Freq: Once | CUTANEOUS | Status: AC
  Administered 2024-08-05: 4 via TOPICAL
  Filled 2024-08-05: qty 4

## 2024-08-05 NOTE — Progress Notes (Signed)
 PROVIDER NOTE: Interpretation of information contained herein should be left to medically-trained personnel. Specific patient instructions are provided elsewhere under Patient Instructions section of medical record. This document was created in part using STT-dictation technology, any transcriptional errors that may result from this process are unintentional.  Patient: Brianna [name] Type: Established DOB: 1961/06/15 MRN: [id number] PCP: Lorel Maxie LABOR, MD  Service: Procedure DOS: 08/05/2024 Setting: Ambulatory Location: Ambulatory outpatient facility Delivery: Face-to-face Provider: Wallie Sherry, MD Specialty: Interventional Pain Management Specialty designation: 09 Location: Outpatient facility Ref. Prov.: Aycock, Ngwe A, MD       Interventional Therapy   Type: Qutenza  Neurolysis #1  Laterality:  Bilateral Area treated: Feet Imaging Guidance: None Anesthesia/analgesia/anxiolysis/sedation: None required Medication (Right): Qutenza  (capsaicin  8%) topical system Medication (Left): Qutenza  (capsaicin  8%) topical system Date: 08/05/2024 Performed by: Wallie Sherry, MD Rationale (medical necessity): procedure needed and proper for the treatment of Brianna [name]'s medical symptoms and needs. Indication: Painful diabetic peripheral neuralgia (DPN) (ICD-10-CM:E11.40) severe enough to impact quality of life or function.  NAS-11 Pain score:   Pre-procedure: 6 /10   Post-procedure: 6 /10     Position / Prep / Materials:  Position: Supine  Materials: Qutenza  Kit (4 patches)  H&P (Pre-op  Assessment):  Brianna [name] is a 63 y.o. (year old), female patient, seen today for interventional treatment. She  has a past surgical history that includes laparotomy (N/A, 02/09/2020); Bowel resection (02/09/2020); DIALYSIS/PERMA CATHETER INSERTION (N/A, 05/28/2023); AV fistula placement (Left, 08/08/2023); A/V Fistulagram (Left, 01/28/2024); and A/V Fistulagram (Left, 04/14/2024). Brianna [name] has a current  medication list which includes the following prescription(s): albuterol , albuterol , allopurinol , amlodipine , aspirin  adult low strength, carvedilol , cetirizine, cetirizine-pseudoephedrine, colchicine, cyanocobalamin , ferosul, furosemide , hydralazine , hydrocodone -acetaminophen , hydroxyzine, isosorbide  mononitrate, januvia, lactulose , omeprazole, ozempic (0.25 or 0.5 mg/dose), sertraline, sevelamer carbonate, triamcinolone ointment, vitamin d  (ergocalciferol ), zetia, and zolpidem , and the following Facility-Administered Medications: capsaicin  topical system. Her primarily concern today is the Knee Pain (bilateral) and Shoulder Pain (left)  Initial Vital Signs:  Pulse/HCG Rate: 82  Temp: (!) 97.3 F (36.3 C) Resp: 20 BP: (!) 120/58 SpO2: 100 %  BMI: Estimated body mass index is 38.95 kg/m as calculated from the following:   Height as of this encounter: 4' 9 (1.448 m).   Weight as of this encounter: 180 lb (81.6 kg).  Risk Assessment: Allergies: Reviewed. She is allergic to ace inhibitors, lisinopril, and lipitor [atorvastatin].  Allergy Precautions: None required Coagulopathies: Reviewed. None identified.  Blood-thinner therapy: None at this time Active Infection(s): Reviewed. None identified. Brianna [name] is afebrile  Site Confirmation: Brianna [name] was asked to confirm the procedure and laterality before marking the site Procedure checklist: Completed Consent: Before the procedure and under the influence of no sedative(s), amnesic(s), or anxiolytics, the patient was informed of the treatment options, risks and possible complications. To fulfill our ethical and legal obligations, as recommended by the American Medical Association's Code of Ethics, I have informed the patient of my clinical impression; the nature and purpose of the treatment or procedure; the risks, benefits, and possible complications of the intervention; the alternatives, including doing nothing; the risk(s) and benefit(s) of  the alternative treatment(s) or procedure(s); and the risk(s) and benefit(s) of doing nothing. The patient was provided information about the general risks and possible complications associated with the procedure. These may include, but are not limited to: failure to achieve desired goals, infection, bleeding, organ or nerve damage, allergic reactions, paralysis, and death. In addition, the patient was informed of those risks and complications associated  to the procedure, such as failure to decrease pain; infection; bleeding; organ or nerve damage with subsequent damage to sensory, motor, and/or autonomic systems, resulting in permanent pain, numbness, and/or weakness of one or several areas of the body; allergic reactions; (i.e.: anaphylactic reaction); and/or death. Furthermore, the patient was informed of those risks and complications associated with the medications. These include, but are not limited to: allergic reactions (i.e.: anaphylactic or anaphylactoid reaction(s)); adrenal axis suppression; blood sugar elevation that in diabetics may result in ketoacidosis or comma; water retention that in patients with history of congestive heart failure may result in shortness of breath, pulmonary edema, and decompensation with resultant heart failure; weight gain; swelling or edema; medication-induced neural toxicity; particulate matter embolism and blood vessel occlusion with resultant organ, and/or nervous system infarction; and/or aseptic necrosis of one or more joints. Finally, the patient was informed that Medicine is not an exact science; therefore, there is also the possibility of unforeseen or unpredictable risks and/or possible complications that may result in a catastrophic outcome. The patient indicated having understood very clearly. We have given the patient no guarantees and we have made no promises. Enough time was given to the patient to ask questions, all of which were answered to the patient's  satisfaction. Brianna [name] has indicated that she wanted to continue with the procedure. Attestation: I, the ordering provider, attest that I have discussed with the patient the benefits, risks, side-effects, alternatives, likelihood of achieving goals, and potential problems during recovery for the procedure that I have provided informed consent. Date  Time: 08/05/2024 10:49 AM  Pre-Procedure Preparation:  Monitoring: As per clinic protocol. Respiration, ETCO2, SpO2, BP, heart rate and rhythm monitor placed and checked for adequate function Safety Precautions: Patient was assessed for positional comfort and pressure points before starting the procedure. Time-out: I initiated and conducted the Time-out before starting the procedure, as per protocol. The patient was asked to participate by confirming the accuracy of the Time Out information. Verification of the correct person, site, and procedure were performed and confirmed by me, the nursing staff, and the patient. Time-out conducted as per Joint Commission's Universal Protocol ([email or web address].01.01.01). Time: 1134 Start Time: 1134 hrs.  Description/Narrative of Procedure:          Region: Distal lower extremities Target Area: Sensory peripheral nerves affected by diabetic peripheral neuropathy Site: Feet Approach: Percutaneous  No./Series: Not applicable  Type: Percutaneous  Purpose: Therapeutic  Region: Distal lower extremities  Start Time: 1134 hrs.  Description of the Procedure: Protocol guidelines were followed. The patient was assisted into a comfortable position.  Informed consent was obtained in the patient monitored in the usual manner.  All questions were answered prior to the procedure.  They Qutenza  patches were applied to the affected area and then covered with the wrap.  The Patient was kept under observation until the treatment was completed.  The patches were removed and the treated area was inspected.  Vitals:   08/05/24 1048  08/05/24 1049  BP:  (!) 120/58  Pulse:  82  Resp: 20   Temp: (!) 97.3 F (36.3 C)   SpO2:  100%  Weight: 180 lb (81.6 kg)   Height: 4' 9 (1.448 m)      End Time: 1116 hrs.  Type of Imaging Technique: None used Indication(s): N/A Exposure Time: No patient exposure Contrast: None used. Fluoroscopic Guidance: N/A Ultrasound Guidance: N/A Interpretation: N/A  Post-operative Assessment:  Post-procedure Vital Signs:  Pulse/HCG Rate: 82  Temp: (!) 97.3 F (  36.3 C) Resp: 20 BP: (!) 120/58 SpO2: 100 %  EBL: None  Complications: No immediate post-treatment complications observed by team, or reported by patient.  Note: The patient tolerated the entire procedure well. A repeat set of vitals were taken after the procedure and the patient was kept under observation following institutional policy, for this type of procedure. Post-procedural neurological assessment was performed, showing return to baseline, prior to discharge. The patient was provided with post-procedure discharge instructions, including a section on how to identify potential problems. Should any problems arise concerning this procedure, the patient was given instructions to immediately contact us , at any time, without hesitation. In any case, we plan to contact the patient by telephone for a follow-up status report regarding this interventional procedure.  Comments:  No additional relevant information.  Plan of Care (POC)  Orders:  No orders of the defined types were placed in this encounter.    Medications ordered for procedure: Meds ordered this encounter  Medications   capsaicin  topical system 8 % patch 4 patch    Disregard order if transmitted to pharmacy. Qutenza  Kit to be used from [facility].   Hyaluronan (MONOVISC) intra-articular injection 88 mg    Do not substitute for formulary alternative without first contacting physician for approval. Deliver to facility day before procedure.    Hyaluronan (MONOVISC) intra-articular injection 88 mg    Do not substitute for formulary alternative without first contacting physician for approval. Deliver to facility day before procedure.   lidocaine  (XYLOCAINE ) 2 % (with pres) injection 400 mg   Medications administered: We administered capsaicin  topical system, Hyaluronan, Hyaluronan, and lidocaine .  See the medical record for exact dosing, route, and time of administration.    Follow-up plan:   Return in about 4 weeks (around 09/02/2024) for PPE, F2F (post b/l monovisc and Qutenza ).     Recent Visits Date Type Provider Dept  07/23/24 Office Visit Patel, Seema K, NP [facility]  07/14/24 Office Visit Marcelino Nurse, MD [facility]  Showing recent visits within past 90 days and meeting all other requirements Today's Visits Date Type Provider Dept  08/05/24 Procedure visit Marcelino Nurse, MD [facility]  Showing today's visits and meeting all other requirements Future Appointments Date Type Provider Dept  08/20/24 Appointment Patel, Seema K, NP [facility]  Showing future appointments within next 90 days and meeting all other requirements   Disposition: Discharge home  Discharge (Date  Time): 08/05/2024;   hrs.   Primary Care Physician: Lorel Maxie LABOR, MD Location: [facility] Note by: Nurse Marcelino, MD (TTS technology used. I apologize for any typographical errors that were not detected and corrected.) Date: 08/05/2024; Time: 11:52 AM  Disclaimer:  Medicine is not an Visual merchandiser. The only guarantee in medicine is that nothing is guaranteed. It is important to note that the decision to proceed with this intervention was based on the information collected from the patient. The Data and conclusions were drawn from the patient's questionnaire, the interview, and the physical examination. Because the information was provided in large part by the patient, it cannot be  guaranteed that it has not been purposely or unconsciously manipulated. Every effort has been made to obtain as much relevant data as possible for this evaluation. It is important to note that the conclusions that lead to this procedure are derived in large part from the available data. Always take into account that the treatment will also be dependent  on availability of resources and existing treatment guidelines, considered by other Pain Management Practitioners as being common knowledge and practice, at the time of the intervention. For Medico-Legal purposes, it is also important to point out that variation in procedural techniques and pharmacological choices are the acceptable norm. The indications, contraindications, technique, and results of the above procedure should only be interpreted and judged by a Board-Certified Interventional Pain Specialist with extensive familiarity and expertise in the same exact procedure and technique.

## 2024-08-05 NOTE — Progress Notes (Signed)
 PROVIDER NOTE: Interpretation of information contained herein should be left to medically-trained personnel. Specific patient instructions are provided elsewhere under Patient Instructions section of medical record. This document was created in part using STT-dictation technology, any transcriptional errors that may result from this process are unintentional.  Patient: Brianna [name] Type: Established DOB: 1961/02/24 MRN: [id number] PCP: Lorel Maxie LABOR, MD  Service: Procedure DOS: 08/05/2024 Setting: Ambulatory Location: Ambulatory outpatient facility Delivery: Face-to-face Provider: Wallie Sherry, MD Specialty: Interventional Pain Management Specialty designation: 09 Location: Outpatient facility Ref. 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  Risk Assessment: Vitals:  AFP:Zdupfjuzi body mass index is 38.95 kg/m as calculated from the following:   Height as of this encounter: 4' 9 (1.448 m).   Weight as of this encounter: 180 lb (81.6 kg)., Rate:82 , BP:(!) 120/58, Resp:20, Temp:(!) 97.3 F (36.3 C), SpO2:100 %  Allergies: She is allergic to ace inhibitors, lisinopril, and lipitor [atorvastatin].  Precautions: No additional precautions required  Blood-thinner(s): None at this time  Coagulopathies: Reviewed. None identified.   Active Infection(s): Reviewed. None identified. Brianna [name] is afebrile   Location setting: Exam room Position: Sitting w/ knee bent 90 degrees Safety  Precautions: Patient was assessed for positional comfort and pressure points before starting the procedure. Prepping solution: DuraPrep (Iodine  Povacrylex [0.7% available iodine ] and Isopropyl Alcohol, 74% w/w) Prep Area: Entire knee region Approach: percutaneous, just above the tibial plateau, medial to the infrapatellar tendon. Intended target: Intra-articular knee space Materials: Tray: Block Needle(s): Regular Qty: 1/side Length: 1.5-inch Gauge: 25G (x1) + 22G (x1)  Meds ordered this encounter  Medications   capsaicin  topical system 8 % patch 4 patch    Disregard order if transmitted to pharmacy. Qutenza  Kit to be used from [facility].   Hyaluronan (MONOVISC) intra-articular injection 88 mg    Do not substitute for formulary alternative without first contacting physician for approval. Deliver to facility day before procedure.   Hyaluronan (MONOVISC) intra-articular injection 88 mg    Do not substitute for formulary alternative without first contacting physician for approval. Deliver to facility day before procedure.   lidocaine  (XYLOCAINE ) 2 % (with pres) injection 400 mg    No orders of the defined types were placed in this encounter.    Time-out: 1134 I initiated and conducted the Time-out before starting the procedure, as per protocol. The patient was asked to participate by confirming the accuracy of the Time Out information. Verification of the correct person, site, and procedure were performed and confirmed by me, the nursing staff, and the patient. Time-out conducted as per Joint Commission's Universal Protocol (UP.01.01.01). Procedure checklist: Completed  H&P (Pre-op   Assessment)  Brianna [name] is a 63 y.o. (year old), female patient, seen today for interventional treatment. She  has a past surgical history that includes laparotomy (N/A, 02/09/2020); Bowel resection (02/09/2020); DIALYSIS/PERMA CATHETER INSERTION (N/A, 05/28/2023); AV fistula placement  (Left, 08/08/2023); A/V Fistulagram (Left, 01/28/2024); and A/V Fistulagram (Left, 04/14/2024). Ms. Withrow has a current medication list which includes the following prescription(s): albuterol , albuterol ,  allopurinol , amlodipine , aspirin  adult low strength, carvedilol , cetirizine, cetirizine-pseudoephedrine, colchicine, cyanocobalamin , ferosul, furosemide , hydralazine , hydrocodone -acetaminophen , hydroxyzine, isosorbide  mononitrate, januvia, lactulose , omeprazole, ozempic (0.25 or 0.5 mg/dose), sertraline, sevelamer carbonate, triamcinolone ointment, vitamin d  (ergocalciferol ), zetia, and zolpidem , and the following Facility-Administered Medications: capsaicin  topical system. Her primarily concern today is the Knee Pain (bilateral) and Shoulder Pain (left)  She is allergic to ace inhibitors, lisinopril, and lipitor [atorvastatin].   Last encounter: My last encounter with her was on 07/14/2024. Pertinent problems: Brianna [name] does not have any pertinent problems on file. Pain Assessment: Severity of Chronic pain is reported as a 6 /10. Location: Knee Right, Left/ . Onset: More than a month ago. Quality: [place], Shooting, Aching. Timing: Constant. Modifying factor(s): rest. Vitals:  height is 4' 9 (1.448 m) and weight is 180 lb (81.6 kg). Her temperature is 97.3 F (36.3 C) (abnormal). Her blood pressure is 120/58 (abnormal) and her pulse is 82. Her respiration is 20 and oxygen saturation is 100%.   Reason for encounter: interventional pain management therapy due pain of at least four (4) weeks in duration, with failure to respond and/or inability to tolerate more conservative care.  Site Confirmation: Brianna [name] was asked to confirm the procedure and laterality before marking the site.  Consent: Before the procedure and under the influence of no sedative(s), amnesic(s), or anxiolytics, the patient was informed of the treatment options, risks and possible complications. To fulfill our ethical and legal  obligations, as recommended by the American Medical Association's Code of Ethics, I have informed the patient of my clinical impression; the nature and purpose of the treatment or procedure; the risks, benefits, and possible complications of the intervention; the alternatives, including doing nothing; the risk(s) and benefit(s) of the alternative treatment(s) or procedure(s); and the risk(s) and benefit(s) of doing nothing. The patient was provided information about the general risks and possible complications associated with the procedure. These may include, but are not limited to: failure to achieve desired goals, infection, bleeding, organ or nerve damage, allergic reactions, paralysis, and death. In addition, the patient was informed of those risks and complications associated to Spine-related procedures, such as failure to decrease pain; infection (i.e.: Meningitis, epidural or intraspinal abscess); bleeding (i.e.: epidural hematoma, subarachnoid hemorrhage, or any other type of intraspinal or peri-dural bleeding); organ or nerve damage (i.e.: Any type of peripheral nerve, nerve root, or spinal cord injury) with subsequent damage to sensory, motor, and/or autonomic systems, resulting in permanent pain, numbness, and/or weakness of one or several areas of the body; allergic reactions; (i.e.: anaphylactic reaction); and/or death. Furthermore, the patient was informed of those risks and complications associated with the medications. These include, but are not limited to: allergic reactions (i.e.: anaphylactic or anaphylactoid reaction(s)); adrenal axis suppression; blood sugar elevation that in diabetics may result in ketoacidosis or comma; water retention that in patients with history of congestive heart failure may result in shortness of breath, pulmonary edema, and decompensation with resultant heart failure; weight gain; swelling or edema; medication-induced neural toxicity; particulate matter embolism and  blood vessel occlusion with resultant organ, and/or nervous system infarction; and/or aseptic necrosis of one or more joints. Finally, the patient was informed that Medicine is not an exact science; therefore, there is also the possibility of unforeseen or unpredictable risks and/or possible complications that may result in a catastrophic outcome. The patient indicated having understood very clearly. We have given the patient no guarantees and we have made no promises. Enough time was given to the patient to  ask questions, all of which were answered to the patient's satisfaction. Ms. Kaminsky has indicated that she wanted to continue with the procedure. Attestation: I, the ordering provider, attest that I have discussed with the patient the benefits, risks, side-effects, alternatives, likelihood of achieving goals, and potential problems during recovery for the procedure that I have provided informed consent.  Date  Time: 08/05/2024 10:44 AM  Description of procedure  Start Time: 1134 hrs  Local Anesthesia: Once the patient was positioned, prepped, and time-out was completed. The target area was identified located. The skin was marked with an approved surgical skin marker. Once marked, the skin (epidermis, dermis, and hypodermis), and deeper tissues (fat, connective tissue and muscle) were infiltrated with a small amount of a short-acting local anesthetic, loaded on a 10cc syringe with a 25G, 1.5-in  Needle. An appropriate amount of time was allowed for local anesthetics to take effect before proceeding to the next step. Local Anesthetic: Lidocaine  1-2% The unused portion of the local anesthetic was discarded in the proper designated containers. Safety Precautions: Aspiration looking for blood return was conducted prior to all injections. At no point did I inject any substances, as a needle was being advanced. Before injecting, the patient was told to immediately notify me if she was experiencing any new  onset of ringing in the ears, or metallic taste in the mouth. No attempts were made at seeking any paresthesias. Safe injection practices and needle disposal techniques used. Medications properly checked for expiration dates. SDV (single dose vial) medications used. After the completion of the procedure, all disposable equipment used was discarded in the proper designated medical waste containers.  Technical description: Protocol guidelines were followed. After positioning, the target area was identified and prepped in the usual manner. Skin & deeper tissues infiltrated with local anesthetic. Appropriate amount of time allowed to pass for local anesthetics to take effect. Proper needle placement secured. Once satisfactory needle placement was confirmed, I proceeded to inject the desired solution in slow, incremental fashion, intermittently assessing for discomfort or any signs of abnormal or undesired spread of substance. Once completed, the needle was removed and disposed of, as per hospital protocols. The area was cleaned, making sure to leave some of the prepping solution back to take advantage of its long term bactericidal properties.  Aspiration:  Negative        Vitals:   08/05/24 1048 08/05/24 1049  BP:  (!) 120/58  Pulse:  82  Resp: 20   Temp: (!) 97.3 F (36.3 C)   SpO2:  100%  Weight: 180 lb (81.6 kg)   Height: 4' 9 (1.448 m)     End Time: 1116 hrs  Imaging guidance  Imaging-assisted Technique: None required. Indication(s): N/A Exposure Time: N/A Contrast: None Fluoroscopic Guidance: N/A Ultrasound Guidance: N/A Interpretation: N/A  Post-op assessment  Post-procedure Vital Signs:  Pulse/HCG Rate: 82  Temp: (!) 97.3 F (36.3 C) Resp: 20 BP: (!) 120/58 SpO2: 100 %  EBL: None  Complications: No immediate post-treatment complications observed by team, or reported by patient.  Note: The patient tolerated the entire procedure well. A repeat set of vitals were  taken after the procedure and the patient was kept under observation following institutional policy, for this type of procedure. Post-procedural neurological assessment was performed, showing return to baseline, prior to discharge. The patient was provided with post-procedure discharge instructions, including a section on how to identify potential problems. Should any problems arise concerning this procedure, the patient was given instructions to  immediately contact us , at any time, without hesitation. In any case, we plan to contact the patient by telephone for a follow-up status report regarding this interventional procedure.  Comments:  No additional relevant information.  Plan of care   Medications administered: We administered capsaicin  topical system, Hyaluronan, Hyaluronan, and lidocaine .    B/L monovisc #1 08/05/24, Qutenza  08/05/24   Follow-up plan:   Return in about 4 weeks (around 09/02/2024) for PPE, F2F (post b/l monovisc and Qutenza ).     Recent Visits Date Type Provider Dept  07/23/24 Office Visit Patel, Seema K, NP [facility]  07/14/24 Office Visit Marcelino Nurse, MD [facility]  Showing recent visits within past 90 days and meeting all other requirements Today's Visits Date Type Provider Dept  08/05/24 Procedure visit Marcelino Nurse, MD [facility]  Showing today's visits and meeting all other requirements Future Appointments Date Type Provider Dept  08/20/24 Appointment Patel, Seema K, NP [facility]  Showing future appointments within next 90 days and meeting all other requirements   Disposition: Discharge home  Discharge (Date  Time): 08/05/2024;   hrs.   Primary Care Physician: Lorel Maxie LABOR, MD Location: [facility] Note by: Nurse Marcelino, MD Date: 08/05/2024; Time: 11:52 AM  DISCLAIMER: Medicine is not an exact science. It has no guarantees or warranties. The decision to proceed with this  intervention was based on the information collected from the patient. Conclusions were drawn from the patient's questionnaire, interview, and examination. Because information was provided in large part by the patient, it cannot be guaranteed that it has not been purposely or unconsciously manipulated or altered. Every effort has been made to obtain as much accurate, relevant, available data as possible. Always take into account that the treatment will also be dependent on availability of resources and existing treatment guidelines, considered by other Pain Management Specialists as being common knowledge and practice, at the time of the intervention. It is also important to point out that variation in procedural techniques and pharmacological choices are the acceptable norm. For Medico-Legal review purposes, the indications, contraindications, technique, and results of the these procedures should only be evaluated, judged and interpreted by a Board-Certified Interventional Pain Specialist with extensive familiarity and expertise in the same exact procedure and technique.

## 2024-08-05 NOTE — Progress Notes (Signed)
 Safety precautions to be maintained throughout the outpatient stay will include: orient to surroundings, keep bed in low position, maintain call bell within reach at all times, provide assistance with transfer out of bed and ambulation.

## 2024-08-05 NOTE — Patient Instructions (Signed)

## 2024-08-06 ENCOUNTER — Telehealth: Payer: Self-pay

## 2024-08-06 NOTE — Telephone Encounter (Signed)
 Post procedure follow up.  Patient states she is doing good.

## 2024-08-18 ENCOUNTER — Other Ambulatory Visit (INDEPENDENT_AMBULATORY_CARE_PROVIDER_SITE_OTHER): Payer: Self-pay | Admitting: Nurse Practitioner

## 2024-08-18 DIAGNOSIS — N186 End stage renal disease: Secondary | ICD-10-CM

## 2024-08-20 ENCOUNTER — Encounter: Payer: Self-pay | Admitting: Nurse Practitioner

## 2024-08-20 ENCOUNTER — Other Ambulatory Visit: Payer: Self-pay

## 2024-08-20 ENCOUNTER — Ambulatory Visit (INDEPENDENT_AMBULATORY_CARE_PROVIDER_SITE_OTHER): Admitting: Vascular Surgery

## 2024-08-20 ENCOUNTER — Encounter (INDEPENDENT_AMBULATORY_CARE_PROVIDER_SITE_OTHER)

## 2024-08-20 ENCOUNTER — Ambulatory Visit: Attending: Nurse Practitioner | Admitting: Nurse Practitioner

## 2024-08-20 VITALS — BP 135/65 | HR 84 | Temp 98.1°F | Ht <= 58 in | Wt 180.0 lb

## 2024-08-20 DIAGNOSIS — M25561 Pain in right knee: Secondary | ICD-10-CM | POA: Diagnosis not present

## 2024-08-20 DIAGNOSIS — M25562 Pain in left knee: Secondary | ICD-10-CM

## 2024-08-20 DIAGNOSIS — M170 Bilateral primary osteoarthritis of knee: Secondary | ICD-10-CM | POA: Diagnosis present

## 2024-08-20 DIAGNOSIS — G894 Chronic pain syndrome: Secondary | ICD-10-CM | POA: Insufficient documentation

## 2024-08-20 DIAGNOSIS — Z79899 Other long term (current) drug therapy: Secondary | ICD-10-CM | POA: Diagnosis present

## 2024-08-20 DIAGNOSIS — Z7985 Long-term (current) use of injectable non-insulin antidiabetic drugs: Secondary | ICD-10-CM

## 2024-08-20 DIAGNOSIS — E1140 Type 2 diabetes mellitus with diabetic neuropathy, unspecified: Secondary | ICD-10-CM

## 2024-08-20 DIAGNOSIS — G8929 Other chronic pain: Secondary | ICD-10-CM | POA: Diagnosis present

## 2024-08-20 MED ORDER — HYDROCODONE-ACETAMINOPHEN 5-325 MG PO TABS
1.0000 | ORAL_TABLET | Freq: Two times a day (BID) | ORAL | 0 refills | Status: DC | PRN
Start: 2024-08-20 — End: 2024-09-24
  Filled 2024-08-20: qty 60, 30d supply, fill #0

## 2024-08-20 NOTE — Progress Notes (Signed)
 PROVIDER NOTE: Interpretation of information contained herein should be left to medically-trained personnel. Specific patient instructions are provided elsewhere under Patient Instructions section of medical record. This document was created in part using AI and STT-dictation technology, any transcriptional errors that may result from this process are unintentional.  Patient: Brianna [name]  Service: E/M   PCP: Lorel Maxie LABOR, MD  DOB: 1961/03/04  DOS: 08/20/2024  Provider: Emmy MARLA Blanch, NP  MRN: [id number]  Delivery: Face-to-face  Specialty: Interventional Pain Management  Type: Established Patient  Setting: Ambulatory outpatient facility  Specialty designation: 09  Referring Prov.: Lorel Maxie LABOR, MD  Location: Outpatient office facility       History of present illness (HPI) [name], a 63 y.o. year old female, is here today because of her Chronic pain syndrome [G89.4]. [name] primary complain today is Arm Pain (Left Arm )  Pertinent problems: [name] has Chronic painful diabetic neuropathy (HCC); Type II diabetes mellitus with renal manifestations (HCC); Morbidly obese (HCC); Chronic knee pain (Bilateral), and Chronic pain syndrome on their pertinent problem list.   Pain Assessment: Severity of Chronic pain is reported as a 7 /10. Location: Arm Left/Left shoulder radiates down left arm down to hand and all fingers. Onset: More than a month ago. Quality: Constant, Jabbing, Sharp, Shooting. Timing: Constant. Modifying factor(s): Rest. Vitals:  height is 4' 9 (1.448 m) and weight is 180 lb (81.6 kg). Her temperature is 98.1 F (36.7 C). Her blood pressure is 135/65 and her pulse is 84. Her oxygen saturation is 96%.  BMI: Estimated body mass index is 38.95 kg/m as calculated from the following:   Height as of this encounter: 4' 9 (1.448 m).   Weight as of this encounter: 180 lb (81.6 kg).  Last encounter: 07/23/2024. Last procedure: 08/05/2024  Reason for  encounter: medication management. No change in medical history since last visit.  Patient's pain is at baseline.  Patient continues multimodal pain regimen as prescribed.  States that it provides pain relief and improvement in functional status.   [name] underwent a diagnostic/therapeutic Intra-articular Knee Injection and  Qutenza  treatment on August 05, 2024.  She reports 100% pain relief and functional improvement during local anesthetic phase and sustain ongoing 100% pain relief and functional improvement since the procedure.   Procedure Type: Qutenza  Neurolysis #1  Laterality:  Bilateral Area treated: Feet Imaging Guidance: None Anesthesia/analgesia/anxiolysis/sedation: None required Medication (Right): Qutenza  (capsaicin  8%) topical system Medication (Left): Qutenza  (capsaicin  8%) topical system Date: 08/05/2024 Performed by: Wallie Sherry, MD Rationale (medical necessity): procedure needed and proper for the treatment of [name]'s medical symptoms and needs. Indication: Painful diabetic peripheral neuralgia (DPN) (ICD-10-CM:E11.40) severe enough to impact quality of life or function.   NAS-11 Pain score:        Pre-procedure: 6 /10        Post-procedure: 6 /10   Procedure Type:  Monovisc Intra-articular Knee Injection #1  Laterality: Bilateral (-50) Level/approach: Medial Imaging guidance: None required ([id number]) Anesthesia: Local anesthesia (1-2% Lidocaine ) DOS: 08/05/2024  Performed by: Wallie Sherry, MD   Purpose: Diagnostic/Therapeutic Indications: Knee arthralgia associated to osteoarthritis of the knee Knee OA   NAS-11 score:         Pre-procedure: 6 /10         Post-procedure: 6 /10   Post-Procedure Evaluation   Effectiveness:  Initial hour after procedure: 100 % . Subsequent 4-6 hours post-procedure: 100 % . Analgesia past initial 6 hours: 100 % .  Ongoing improvement:  Analgesic:  [name] underwent a diagnostic/therapeutic Intra-articular  Knee Injection and  Qutenza  treatment on August 05, 2024.  She reports 100% pain relief and functional improvement during local anesthetic phase and sustain ongoing 100% pain relief and functional improvement since the procedure.  Function: [name] reports improvement in function ROM: [name] reports improvement in ROM  Pharmacotherapy Assessment   Hydrocodone -acetaminophen  (Norco) 5-325 mg tablet BID as needed for pain.  Monitoring: [place] PMP: PDMP reviewed during this encounter.       Pharmacotherapy: No side-effects or adverse reactions reported. Compliance: No problems identified. Effectiveness: Clinically acceptable.  [name], [place]  08/20/2024 11:33 AM  Sign when Signing Visit Safety precautions to be maintained throughout the outpatient stay will include: orient to surroundings, keep bed in low position, maintain call bell within reach at all times, provide assistance with transfer out of bed and ambulation.     UDS:  No results found for: SUMMARY  No results found for: CBDTHCR No results found for: D8THCCBX No results found for: D9THCCBX  ROS  Constitutional: Denies any fever or chills Gastrointestinal: No reported hemesis, hematochezia, vomiting, or acute GI distress Musculoskeletal: Left arm pain Neurological: No reported episodes of acute onset apraxia, aphasia, dysarthria, agnosia, amnesia, paralysis, loss of coordination, or loss of consciousness  Medication Review  HYDROcodone -acetaminophen , Semaglutide(0.25 or 0.5MG /DOS), Vitamin D  (Ergocalciferol ), albuterol , allopurinol , amLODipine , aspirin  EC, carvedilol , cetirizine, cetirizine-pseudoephedrine, colchicine, cyanocobalamin , ezetimibe, ferrous sulfate, furosemide , hydrALAZINE , hydrOXYzine, isosorbide  mononitrate, lactulose , omeprazole, sertraline, sevelamer carbonate, sitaGLIPtin, triamcinolone ointment, and zolpidem   History Review  Allergy: [name] is allergic to ace inhibitors, lisinopril,  and lipitor [atorvastatin]. Drug: [name]  reports no history of drug use. Alcohol:  reports no history of alcohol use. Tobacco:  reports that she has never smoked. She has never used smokeless tobacco. Social: Ms. Burgoon  reports that she has never smoked. She has never used smokeless tobacco. She reports that she does not drink alcohol and does not use drugs. Medical:  has a past medical history of Acid reflux, Anasarca, Anemia, Asthma, Bilateral leg edema, Bronchopneumonia, Chronic heart failure with preserved ejection fraction (HFpEF) (HCC), CKD (chronic kidney disease), stage IV (HCC), DM (diabetes mellitus), type 2 (HCC), Dyspnea, Hypertension, Hypokalemia, Morbid obesity (HCC), and Strangulated ventral hernia. Surgical: Ms. Ferrall  has a past surgical history that includes laparotomy (N/A, 02/09/2020); Bowel resection (02/09/2020); DIALYSIS/PERMA CATHETER INSERTION (N/A, 05/28/2023); AV fistula placement (Left, 08/08/2023); A/V Fistulagram (Left, 01/28/2024); and A/V Fistulagram (Left, 04/14/2024). Family: family history is not on file.  Laboratory Chemistry Profile   Renal Lab Results  Component Value Date   BUN 20 10/23/2023   CREATININE 3.03 (H) 10/23/2023   LABCREA 40 05/23/2023   GFRAA 26 (L) 02/14/2020   GFRNONAA 17 (L) 10/23/2023    Hepatic Lab Results  Component Value Date   AST 16 10/23/2023   ALT 10 10/23/2023   ALBUMIN 3.9 10/23/2023   ALKPHOS 86 10/23/2023   HCVAB NON REACTIVE 01/14/2021   LIPASE 19 02/09/2020    Electrolytes Lab Results  Component Value Date   NA 135 10/23/2023   K 2.5 (LL) 10/23/2023   CL 96 (L) 10/23/2023   CALCIUM 9.0 10/23/2023   MG 1.6 (L) 05/31/2023   PHOS 4.6 05/31/2023    Bone Lab Results  Component Value Date   VD25OH See Scanned report in [place] Link 05/31/2023    Inflammation (CRP: Acute Phase) (ESR: Chronic Phase) Lab Results  Component Value Date   LATICACIDVEN  1.7 02/09/2020         Note: Above Lab results  reviewed.  Recent Imaging Review  DG Knee Complete 4 Views Left CLINICAL DATA:  Chronic left knee pain  EXAM: LEFT KNEE - COMPLETE 4+ VIEW  COMPARISON:  None Available.  FINDINGS: Mild to moderate degenerative changes throughout the left knee, most pronounced in the medial compartment with joint space narrowing, spurring and subchondral cysts. No acute bony abnormality. Specifically, no fracture, subluxation, or dislocation. No joint effusion  IMPRESSION: Mild to moderate degenerative changes most pronounced in the medial compartment. No acute bony abnormality.  Electronically Signed   By: Franky Crease M.D.   On: 07/29/2024 22:08 DG Knee Complete 4 Views Right CLINICAL DATA:  Chronic right knee pain  EXAM: RIGHT KNEE - COMPLETE 4+ VIEW  COMPARISON:  None  FINDINGS: Tricompartment degenerative changes most pronounced in the medial and patellofemoral compartments. No joint effusion. No acute bony abnormality. Specifically, no fracture, subluxation, or dislocation.  IMPRESSION: Moderate tricompartment degenerative changes. No acute bony abnormality.  Electronically Signed   By: Franky Crease M.D.   On: 07/29/2024 22:08 Sleep Study Documents Ordered by an unspecified provider. Note: Reviewed        Physical Exam  Vitals: BP 135/65   Pulse 84   Temp 98.1 F (36.7 C)   Ht 4' 9 (1.448 m)   Wt 180 lb (81.6 kg)   SpO2 96%   BMI 38.95 kg/m  BMI: Estimated body mass index is 38.95 kg/m as calculated from the following:   Height as of this encounter: 4' 9 (1.448 m).   Weight as of this encounter: 180 lb (81.6 kg). Ideal: Patient must be at least 60 in tall to calculate ideal body weight General appearance: Well nourished, well developed, and well hydrated. In no apparent acute distress Mental status: Alert, oriented x 3 (person, place, & time)       Respiratory: No evidence of acute respiratory distress Eyes: PERLA   Assessment   Diagnosis Status  1.  Chronic pain syndrome   2. Medication management   3. Bilateral chronic knee pain   4. Chronic painful diabetic neuropathy (HCC)   5. Bilateral primary osteoarthritis of knee    Controlled Controlled Controlled   Updated Problems: No problems updated.  Plan of Care  Problem-specific:  Assessment and Plan We will continue on current medication regimen.  Prescribing drug monitoring (PDMP) reviewed.  Urine drug screening (UDS) up-to-date.  No other new issues or problems reported at this visit.  The patient initially thought that her pharmacy provided a home delivery for Norco, but later discovered that delivery was not available.  She therefore requested to transfer her prescription to [facility] [place] [facility].  Schedule follow-up in 30 days for medication management with Tarry Blayney, NP.  Ms. Ysabelle Goodroe has a current medication list which includes the following long-term medication(s): albuterol , albuterol , allopurinol , carvedilol , cetirizine, colchicine, ferosul, furosemide , hydralazine , isosorbide  mononitrate, omeprazole, sertraline, zetia, and zolpidem .  Pharmacotherapy (Medications Ordered): Meds ordered this encounter  Medications   HYDROcodone -acetaminophen  (NORCO/VICODIN) 5-325 MG tablet    Sig: Take 1 tablet by mouth 2 (two) times daily as needed for severe pain (pain score 7-10). Must last 30 days    Dispense:  60 tablet    Refill:  0    Chronic Pain: STOP Act (Not applicable) Fill 1 day early if closed on refill date. Avoid benzodiazepines within 8 hours of opioids   Orders:  No orders  of the defined types were placed in this encounter.       Return in about 1 month (around 09/20/2024) for (F2F), (MM), Emmy Blanch NP.    Recent Visits Date Type Provider Dept  08/05/24 Procedure visit Marcelino Nurse, MD [facility]  07/23/24 Office Visit Zayon Trulson K, NP [facility]  07/14/24 Office Visit Marcelino Nurse, MD [facility]  Showing recent visits within past 90 days and meeting all other requirements Today's Visits Date Type Provider Dept  08/20/24 Office Visit Dreyton Roessner K, NP [facility]  Showing today's visits and meeting all other requirements Future Appointments Date Type Provider Dept  09/08/24 Appointment Marcelino Nurse, MD [facility]  09/15/24 Appointment Hughes Wyndham K, NP [facility]  Showing future appointments within next 90 days and meeting all other requirements  I discussed the assessment and treatment plan with the patient. The patient was provided an opportunity to ask questions and all were answered. The patient agreed with the plan and demonstrated an understanding of the instructions.  Patient advised to call back or seek an in-person evaluation if the symptoms or condition worsens.  Duration of encounter: 30 minutes.  Total time on encounter, as per AMA guidelines included both the face-to-face and non-face-to-face time personally spent by the physician and/or other qualified health care professional(s) on the day of the encounter (includes time in activities that require the physician or other qualified health care professional and does not include time in activities normally performed by clinical staff). Physician's time may include the following activities when performed: Preparing to see the patient (e.g., pre-charting review of records, searching for previously ordered imaging, lab work, and nerve conduction tests) Review of prior analgesic pharmacotherapies. Reviewing PMP Interpreting ordered tests (e.g., lab work, imaging, nerve conduction tests) Performing post-procedure evaluations, including interpretation of diagnostic procedures Obtaining and/or reviewing separately obtained history Performing a medically appropriate examination and/or evaluation Counseling and educating the patient/family/caregiver Ordering medications, tests, or  procedures Referring and communicating with other health care professionals (when not separately reported) Documenting clinical information in the electronic or other health record Independently interpreting results (not separately reported) and communicating results to the patient/ family/caregiver Care coordination (not separately reported)  Note by: Shikira Folino K Jomaira Darr, NP (TTS and AI technology used. I apologize for any typographical errors that were not detected and corrected.) Date: 08/20/2024; Time: 11:37 AM

## 2024-08-20 NOTE — Progress Notes (Signed)
 Safety precautions to be maintained throughout the outpatient stay will include: orient to surroundings, keep bed in low position, maintain call bell within reach at all times, provide assistance with transfer out of bed and ambulation.

## 2024-08-27 ENCOUNTER — Encounter (INDEPENDENT_AMBULATORY_CARE_PROVIDER_SITE_OTHER): Payer: Medicare Other

## 2024-08-27 ENCOUNTER — Ambulatory Visit (INDEPENDENT_AMBULATORY_CARE_PROVIDER_SITE_OTHER): Payer: Medicare Other | Admitting: Vascular Surgery

## 2024-09-08 ENCOUNTER — Ambulatory Visit
Attending: Student in an Organized Health Care Education/Training Program | Admitting: Student in an Organized Health Care Education/Training Program

## 2024-09-08 ENCOUNTER — Encounter: Payer: Self-pay | Admitting: Student in an Organized Health Care Education/Training Program

## 2024-09-08 VITALS — BP 132/54 | HR 77 | Temp 97.9°F | Resp 16 | Ht <= 58 in | Wt 180.0 lb

## 2024-09-08 DIAGNOSIS — Z79899 Other long term (current) drug therapy: Secondary | ICD-10-CM

## 2024-09-08 DIAGNOSIS — Z7984 Long term (current) use of oral hypoglycemic drugs: Secondary | ICD-10-CM | POA: Diagnosis not present

## 2024-09-08 DIAGNOSIS — E1140 Type 2 diabetes mellitus with diabetic neuropathy, unspecified: Secondary | ICD-10-CM

## 2024-09-08 DIAGNOSIS — M170 Bilateral primary osteoarthritis of knee: Secondary | ICD-10-CM

## 2024-09-08 DIAGNOSIS — G8929 Other chronic pain: Secondary | ICD-10-CM | POA: Insufficient documentation

## 2024-09-08 DIAGNOSIS — M25562 Pain in left knee: Secondary | ICD-10-CM | POA: Diagnosis present

## 2024-09-08 DIAGNOSIS — M25561 Pain in right knee: Secondary | ICD-10-CM | POA: Diagnosis present

## 2024-09-08 DIAGNOSIS — G894 Chronic pain syndrome: Secondary | ICD-10-CM | POA: Diagnosis not present

## 2024-09-08 NOTE — Progress Notes (Signed)
 Safety precautions to be maintained throughout the outpatient stay will include: orient to surroundings, keep bed in low position, maintain call bell within reach at all times, provide assistance with transfer out of bed and ambulation.

## 2024-09-08 NOTE — Progress Notes (Signed)
 PROVIDER NOTE: Interpretation of information contained herein should be left to medically-trained personnel. Specific patient instructions are provided elsewhere under Patient Instructions section of medical record. This document was created in part using AI and STT-dictation technology, any transcriptional errors that may result from this process are unintentional.  Patient: Brianna [name]  Service: E/M   PCP: Lorel Maxie LABOR, MD  DOB: 03/04/1961  DOS: 09/08/2024  Provider: Wallie Sherry, MD  MRN: [id number]  Delivery: Face-to-face  Specialty: Interventional Pain Management  Type: Established Patient  Setting: Ambulatory outpatient facility  Specialty designation: 09  Referring Prov.: Lorel Maxie LABOR, MD  Location: Outpatient office facility       History of present illness (HPI) [name], a 63 y.o. year old female, is here today because of her Chronic pain syndrome [G89.4]. [name] primary complain today is Peripheral Neuropathy (B/L feet)    Pain Assessment: Severity of Neuropathic pain is reported as a 0-No pain/10. Location: Foot Right, Left/ . Onset:  . Quality:  . Timing:  . Modifying factor(s): Qutenza . Vitals:  height is 4' 9 (1.448 m) and weight is 180 lb (81.6 kg). Her temperature is 97.9 F (36.6 C). Her blood pressure is 132/54 (abnormal) and her pulse is 77. Her respiration is 16 and oxygen saturation is 100%.  BMI: Estimated body mass index is 38.95 kg/m as calculated from the following:   Height as of this encounter: 4' 9 (1.448 m).   Weight as of this encounter: 180 lb (81.6 kg).  Last encounter: 07/14/2024. Last procedure: 08/05/2024.  Reason for encounter:   Post-Procedure Evaluation   Type: Qutenza  Neurolysis #1  Laterality:  Bilateral Area treated: Feet Imaging Guidance: None Anesthesia/analgesia/anxiolysis/sedation: None required Medication (Right): Qutenza  (capsaicin  8%) topical system Medication (Left): Qutenza  (capsaicin  8%) topical  system Date: 08/05/2024 Performed by: Wallie Sherry, MD Rationale (medical necessity): procedure needed and proper for the treatment of Brianna [name]'s medical symptoms and needs. Indication: Painful diabetic peripheral neuralgia (DPN) (ICD-10-CM:E11.40) severe enough to impact quality of life or function.  NAS-11 Pain score:   Pre-procedure: 6 /10   Post-procedure: 6 /10     Effectiveness:  Initial hour after procedure: 90 %  Subsequent 4-6 hours post-procedure: 90 %  Analgesia past initial 6 hours: 100 %  Ongoing improvement:  Analgesic:  100% Function: Somewhat improved  Type:  Monovisc Intra-articular Knee Injection #1  Laterality: Bilateral (-50) Level/approach: Medial Imaging guidance: None required ([id number]) Anesthesia: Local anesthesia (1-2% Lidocaine ) DOS: 08/05/2024  Performed by: Wallie Sherry, MD 100% pain relief     ROS  Constitutional: Denies any fever or chills Gastrointestinal: No reported hemesis, hematochezia, vomiting, or acute GI distress Musculoskeletal: improvement in knee pain and feet pain Neurological: No reported episodes of acute onset apraxia, aphasia, dysarthria, agnosia, amnesia, paralysis, loss of coordination, or loss of consciousness  Medication Review  HYDROcodone -acetaminophen , Semaglutide(0.25 or 0.5MG /DOS), Vitamin D  (Ergocalciferol ), albuterol , allopurinol , amLODipine , aspirin  EC, carvedilol , cetirizine, cetirizine-pseudoephedrine, colchicine, cyanocobalamin , ezetimibe , ferrous sulfate, furosemide , hydrALAZINE , hydrOXYzine , isosorbide  mononitrate, lactulose , omeprazole, sertraline , sevelamer  carbonate, sitaGLIPtin, triamcinolone ointment, and zolpidem   History Review  Allergy: Brianna [name] is allergic to ace inhibitors, lisinopril, and lipitor [atorvastatin]. Drug: Brianna [name]  reports no history of drug use. Alcohol:  reports no history of alcohol use. Tobacco:  reports that she has never smoked. She has never used smokeless  tobacco. Social: Brianna [name]  reports that she has never smoked. She has never used smokeless tobacco. She reports that she does not drink alcohol and does  not use drugs. Medical:  has a past medical history of Acid reflux, Anasarca, Anemia, Asthma, Bilateral leg edema, Bronchopneumonia, Chronic heart failure with preserved ejection fraction (HFpEF) (HCC), CKD (chronic kidney disease), stage IV (HCC), DM (diabetes mellitus), type 2 (HCC), Dyspnea, Hypertension, Hypokalemia, Morbid obesity (HCC), and Strangulated ventral hernia. Surgical: Brianna [name]  has a past surgical history that includes laparotomy (N/A, 02/09/2020); Bowel resection (02/09/2020); DIALYSIS/PERMA CATHETER INSERTION (N/A, 05/28/2023); AV fistula placement (Left, 08/08/2023); A/V Fistulagram (Left, 01/28/2024); and A/V Fistulagram (Left, 04/14/2024). Family: family history is not on file.  Laboratory Chemistry Profile   Renal Lab Results  Component Value Date   BUN 20 10/23/2023   CREATININE 3.03 (H) 10/23/2023   LABCREA 40 05/23/2023   GFRAA 26 (L) 02/14/2020   GFRNONAA 17 (L) 10/23/2023    Hepatic Lab Results  Component Value Date   AST 16 10/23/2023   ALT 10 10/23/2023   ALBUMIN 3.9 10/23/2023   ALKPHOS 86 10/23/2023   HCVAB NON REACTIVE 01/14/2021   LIPASE 19 02/09/2020    Electrolytes Lab Results  Component Value Date   NA 135 10/23/2023   K 2.5 (LL) 10/23/2023   CL 96 (L) 10/23/2023   CALCIUM 9.0 10/23/2023   MG 1.6 (L) 05/31/2023   PHOS 4.6 05/31/2023    Bone Lab Results  Component Value Date   VD25OH See Scanned report in [facility] 05/31/2023    Inflammation (CRP: Acute Phase) (ESR: Chronic Phase) Lab Results  Component Value Date   LATICACIDVEN 1.7 02/09/2020         Note: Above Lab results reviewed.  Recent Imaging Review  DG Knee Complete 4 Views Left CLINICAL DATA:  Chronic left knee pain  EXAM: LEFT KNEE - COMPLETE 4+ VIEW  COMPARISON:  None Available.  FINDINGS: Mild to  moderate degenerative changes throughout the left knee, most pronounced in the medial compartment with joint space narrowing, spurring and subchondral cysts. No acute bony abnormality. Specifically, no fracture, subluxation, or dislocation. No joint effusion  IMPRESSION: Mild to moderate degenerative changes most pronounced in the medial compartment. No acute bony abnormality.  Electronically Signed   By: Franky Crease M.D.   On: 07/29/2024 22:08 DG Knee Complete 4 Views Right CLINICAL DATA:  Chronic right knee pain  EXAM: RIGHT KNEE - COMPLETE 4+ VIEW  COMPARISON:  None  FINDINGS: Tricompartment degenerative changes most pronounced in the medial and patellofemoral compartments. No joint effusion. No acute bony abnormality. Specifically, no fracture, subluxation, or dislocation.  IMPRESSION: Moderate tricompartment degenerative changes. No acute bony abnormality.  Electronically Signed   By: Franky Crease M.D.   On: 07/29/2024 22:08 Sleep Study Documents Ordered by an unspecified provider. Note: Reviewed        Physical Exam  Vitals: BP (!) 132/54   Pulse 77   Temp 97.9 F (36.6 C)   Resp 16   Ht 4' 9 (1.448 m)   Wt 180 lb (81.6 kg)   SpO2 100%   BMI 38.95 kg/m  BMI: Estimated body mass index is 38.95 kg/m as calculated from the following:   Height as of this encounter: 4' 9 (1.448 m).   Weight as of this encounter: 180 lb (81.6 kg). Ideal: Patient must be at least 60 in tall to calculate ideal body weight General appearance: Well nourished, well developed, and well hydrated. In no apparent acute distress Mental status: Alert, oriented x 3 (person, place, & time)       Respiratory: No evidence of acute  respiratory distress Eyes: PERLA   Assessment   Diagnosis Status  1. Chronic pain syndrome   2. Medication management   3. Bilateral chronic knee pain   4. Chronic painful diabetic neuropathy (HCC)   5. Bilateral primary osteoarthritis of knee     Controlled Controlled Controlled   Updated Problems: No problems updated.  Plan of Care  Excellent response to bilateral knee Monovisc and Qutenza .  Repeat after 3 months. Orders:  Orders Placed This Encounter  Procedures   NEUROLYSIS    Please order Qutenza  patches    Standing Status:   Future    Expected Date:   11/05/2024    Expiration Date:   09/08/2025    Where will this procedure be performed?:   [facility]   KNEE INJECTION    Indications: Knee arthralgia (pain) due to osteoarthritis (OA) Imaging: None ([id number]) Position: Sitting Equipment/Materials: Block tray  1.5, 25-G (one per side)  Local anesthetic  Monovisc (one per side) Confirm availability (in office) of Monovisc (HMW hyaluronan)    Standing Status:   Future    Expected Date:   11/02/2024    Expiration Date:   09/08/2025    Scheduling Instructions:     Procedure: Knee injection Monovisc (Hyaluronan/Hyaluronic acid)     Treatment No.: 2           Level: Intra-articular     Laterality: Bilateral Knee     Sedation: Patient's choice.    Where will this procedure be performed?:   [facility]     B/L monovisc #1 08/05/24, Qutenza  08/05/24    Return in about 2 months (around 11/09/2024) for Qutenza  and B/L Monovisc #2.    Recent Visits Date Type Provider Dept  08/20/24 Office Visit Patel, Seema K, NP [facility]  08/05/24 Procedure visit Marcelino Nurse, MD [facility]  07/23/24 Office Visit Patel, Seema K, NP [facility]  07/14/24 Office Visit Marcelino Nurse, MD [facility]  Showing recent visits within past 90 days and meeting all other requirements Today's Visits Date Type Provider Dept  09/08/24 Office Visit Marcelino Nurse, MD [facility]  Showing today's visits and meeting all other requirements Future Appointments Date Type Provider Dept  09/15/24 Appointment Patel, Seema K, NP [facility]  11/10/24 Appointment Marcelino Nurse,  MD [facility]  Showing future appointments within next 90 days and meeting all other requirements  I discussed the assessment and treatment plan with the patient. The patient was provided an opportunity to ask questions and all were answered. The patient agreed with the plan and demonstrated an understanding of the instructions.  Patient advised to call back or seek an in-person evaluation if the symptoms or condition worsens.  Duration of encounter: .  Total time on encounter, as per AMA guidelines included both the face-to-face and non-face-to-face time personally spent by the physician and/or other qualified health care professional(s) on the day of the encounter (includes time in activities that require the physician or other qualified health care professional and does not include time in activities normally performed by clinical staff). Physician's time may include the following activities when performed: Preparing to see the patient (e.g., pre-charting review of records, searching for previously ordered imaging, lab work, and nerve conduction tests) Review of prior analgesic pharmacotherapies. Reviewing PMP Interpreting ordered tests (e.g., lab work, imaging, nerve conduction tests) Performing post-procedure evaluations, including interpretation of diagnostic procedures Obtaining and/or reviewing separately obtained history Performing a medically appropriate  examination and/or evaluation Counseling and educating the patient/family/caregiver Ordering medications, tests, or procedures Referring and communicating with other health care professionals (when not separately reported) Documenting clinical information in the electronic or other health record Independently interpreting results (not separately reported) and communicating results to the patient/ family/caregiver Care coordination (not separately reported)  Note by: Wallie Sherry, MD (TTS and AI technology used. I  apologize for any typographical errors that were not detected and corrected.) Date: 09/08/2024; Time: 10:25 AM

## 2024-09-12 ENCOUNTER — Emergency Department

## 2024-09-12 ENCOUNTER — Other Ambulatory Visit: Payer: Self-pay

## 2024-09-12 ENCOUNTER — Encounter: Payer: Self-pay | Admitting: Emergency Medicine

## 2024-09-12 ENCOUNTER — Encounter
Admission: EM | Disposition: A | Discharge status: Skilled Nursing Facility | Payer: Self-pay | Source: Home / Self Care | Attending: General Surgery

## 2024-09-12 ENCOUNTER — Emergency Department: Admitting: Certified Registered"

## 2024-09-12 ENCOUNTER — Inpatient Hospital Stay
Admission: EM | Admit: 2024-09-12 | Discharge: 2024-09-24 | DRG: 335 | Disposition: A | Discharge status: Skilled Nursing Facility | Attending: General Surgery | Admitting: General Surgery

## 2024-09-12 DIAGNOSIS — D259 Leiomyoma of uterus, unspecified: Secondary | ICD-10-CM | POA: Diagnosis present

## 2024-09-12 DIAGNOSIS — D72829 Elevated white blood cell count, unspecified: Secondary | ICD-10-CM | POA: Diagnosis present

## 2024-09-12 DIAGNOSIS — K430 Incisional hernia with obstruction, without gangrene: Secondary | ICD-10-CM | POA: Diagnosis not present

## 2024-09-12 DIAGNOSIS — N186 End stage renal disease: Secondary | ICD-10-CM | POA: Diagnosis present

## 2024-09-12 DIAGNOSIS — D631 Anemia in chronic kidney disease: Secondary | ICD-10-CM | POA: Diagnosis present

## 2024-09-12 DIAGNOSIS — Z992 Dependence on renal dialysis: Secondary | ICD-10-CM

## 2024-09-12 DIAGNOSIS — E66812 Obesity, class 2: Secondary | ICD-10-CM | POA: Diagnosis present

## 2024-09-12 DIAGNOSIS — G894 Chronic pain syndrome: Secondary | ICD-10-CM | POA: Diagnosis present

## 2024-09-12 DIAGNOSIS — Z23 Encounter for immunization: Secondary | ICD-10-CM

## 2024-09-12 DIAGNOSIS — R579 Shock, unspecified: Secondary | ICD-10-CM | POA: Diagnosis present

## 2024-09-12 DIAGNOSIS — K436 Other and unspecified ventral hernia with obstruction, without gangrene: Principal | ICD-10-CM | POA: Diagnosis present

## 2024-09-12 DIAGNOSIS — M109 Gout, unspecified: Secondary | ICD-10-CM | POA: Diagnosis present

## 2024-09-12 DIAGNOSIS — K219 Gastro-esophageal reflux disease without esophagitis: Secondary | ICD-10-CM | POA: Diagnosis present

## 2024-09-12 DIAGNOSIS — K56609 Unspecified intestinal obstruction, unspecified as to partial versus complete obstruction: Secondary | ICD-10-CM | POA: Diagnosis not present

## 2024-09-12 DIAGNOSIS — K660 Peritoneal adhesions (postprocedural) (postinfection): Secondary | ICD-10-CM | POA: Diagnosis present

## 2024-09-12 DIAGNOSIS — I5032 Chronic diastolic (congestive) heart failure: Secondary | ICD-10-CM | POA: Diagnosis present

## 2024-09-12 DIAGNOSIS — K9189 Other postprocedural complications and disorders of digestive system: Secondary | ICD-10-CM | POA: Diagnosis not present

## 2024-09-12 DIAGNOSIS — K5730 Diverticulosis of large intestine without perforation or abscess without bleeding: Secondary | ICD-10-CM | POA: Diagnosis present

## 2024-09-12 DIAGNOSIS — Z6837 Body mass index (BMI) 37.0-37.9, adult: Secondary | ICD-10-CM

## 2024-09-12 DIAGNOSIS — E872 Acidosis, unspecified: Secondary | ICD-10-CM | POA: Diagnosis present

## 2024-09-12 DIAGNOSIS — Z87892 Personal history of anaphylaxis: Secondary | ICD-10-CM

## 2024-09-12 DIAGNOSIS — Z7985 Long-term (current) use of injectable non-insulin antidiabetic drugs: Secondary | ICD-10-CM

## 2024-09-12 DIAGNOSIS — Z888 Allergy status to other drugs, medicaments and biological substances status: Secondary | ICD-10-CM

## 2024-09-12 DIAGNOSIS — Z7982 Long term (current) use of aspirin: Secondary | ICD-10-CM

## 2024-09-12 DIAGNOSIS — J9601 Acute respiratory failure with hypoxia: Secondary | ICD-10-CM | POA: Diagnosis not present

## 2024-09-12 DIAGNOSIS — J45909 Unspecified asthma, uncomplicated: Secondary | ICD-10-CM | POA: Diagnosis present

## 2024-09-12 DIAGNOSIS — N184 Chronic kidney disease, stage 4 (severe): Secondary | ICD-10-CM

## 2024-09-12 DIAGNOSIS — G4733 Obstructive sleep apnea (adult) (pediatric): Secondary | ICD-10-CM | POA: Diagnosis present

## 2024-09-12 DIAGNOSIS — K567 Ileus, unspecified: Secondary | ICD-10-CM | POA: Diagnosis not present

## 2024-09-12 DIAGNOSIS — N2581 Secondary hyperparathyroidism of renal origin: Secondary | ICD-10-CM | POA: Diagnosis present

## 2024-09-12 DIAGNOSIS — E1122 Type 2 diabetes mellitus with diabetic chronic kidney disease: Secondary | ICD-10-CM | POA: Diagnosis present

## 2024-09-12 DIAGNOSIS — K439 Ventral hernia without obstruction or gangrene: Principal | ICD-10-CM

## 2024-09-12 DIAGNOSIS — G9341 Metabolic encephalopathy: Secondary | ICD-10-CM | POA: Diagnosis not present

## 2024-09-12 DIAGNOSIS — I132 Hypertensive heart and chronic kidney disease with heart failure and with stage 5 chronic kidney disease, or end stage renal disease: Secondary | ICD-10-CM | POA: Diagnosis present

## 2024-09-12 DIAGNOSIS — I3139 Other pericardial effusion (noninflammatory): Secondary | ICD-10-CM | POA: Diagnosis not present

## 2024-09-12 DIAGNOSIS — I2489 Other forms of acute ischemic heart disease: Secondary | ICD-10-CM | POA: Diagnosis present

## 2024-09-12 HISTORY — PX: LAPAROSCOPIC TRANSABDOMINAL HERNIA: SHX5933

## 2024-09-12 HISTORY — PX: OMENTECTOMY: SHX5985

## 2024-09-12 HISTORY — PX: LYSIS OF ADHESION: SHX5961

## 2024-09-12 LAB — BASIC METABOLIC PANEL WITH GFR
Anion gap: 23 — ABNORMAL HIGH (ref 5–15)
BUN: 44 mg/dL — ABNORMAL HIGH (ref 8–23)
CO2: 25 mmol/L (ref 22–32)
Calcium: 10.3 mg/dL (ref 8.9–10.3)
Chloride: 93 mmol/L — ABNORMAL LOW (ref 98–111)
Creatinine, Ser: 7.58 mg/dL — ABNORMAL HIGH (ref 0.44–1.00)
GFR, Estimated: 6 mL/min — ABNORMAL LOW (ref >60)
Glucose, Bld: 190 mg/dL — ABNORMAL HIGH (ref 70–99)
Potassium: 3.5 mmol/L (ref 3.5–5.1)
Sodium: 141 mmol/L (ref 135–145)

## 2024-09-12 LAB — HEPATIC FUNCTION PANEL
ALT: 15 U/L (ref 0–44)
AST: 23 U/L (ref 15–41)
Albumin: 4.3 g/dL (ref 3.5–5.0)
Alkaline Phosphatase: 114 U/L (ref 38–126)
Bilirubin, Direct: <0.1 mg/dL (ref 0.0–0.2)
Total Bilirubin: 0.8 mg/dL (ref 0.0–1.2)
Total Protein: 8.7 g/dL — ABNORMAL HIGH (ref 6.5–8.1)

## 2024-09-12 LAB — CBC
HCT: 41.7 % (ref 36.0–46.0)
Hemoglobin: 13.2 g/dL (ref 12.0–15.0)
MCH: 29.8 pg (ref 26.0–34.0)
MCHC: 31.7 g/dL (ref 30.0–36.0)
MCV: 94.1 fL (ref 80.0–100.0)
Platelets: 263 K/uL (ref 150–400)
RBC: 4.43 MIL/uL (ref 3.87–5.11)
RDW: 15.5 % (ref 11.5–15.5)
WBC: 8.2 K/uL (ref 4.0–10.5)
nRBC: 0 % (ref 0.0–0.2)

## 2024-09-12 LAB — TROPONIN I (HIGH SENSITIVITY)
Troponin I (High Sensitivity): 17 ng/L (ref <18)
Troponin I (High Sensitivity): 20 ng/L — ABNORMAL HIGH (ref <18)

## 2024-09-12 LAB — LIPASE, BLOOD: Lipase: 32 U/L (ref 11–51)

## 2024-09-12 SURGERY — REPAIR, HERNIA, LAPAROSCOPIC, ABDOMINAL APPROACH
Anesthesia: General

## 2024-09-12 MED ORDER — 0.9 % SODIUM CHLORIDE (POUR BTL) OPTIME
TOPICAL | Status: DC | PRN
  Administered 2024-09-12: 500 mL

## 2024-09-12 MED ORDER — FENTANYL CITRATE PF 50 MCG/ML IJ SOSY
50.0000 ug | PREFILLED_SYRINGE | Freq: Once | INTRAMUSCULAR | Status: AC
  Administered 2024-09-12: 50 ug via INTRAVENOUS
  Filled 2024-09-12: qty 1

## 2024-09-12 MED ORDER — SUCCINYLCHOLINE CHLORIDE 200 MG/10ML IV SOSY
PREFILLED_SYRINGE | INTRAVENOUS | Status: AC
  Filled 2024-09-12: qty 10

## 2024-09-12 MED ORDER — SUCCINYLCHOLINE CHLORIDE 200 MG/10ML IV SOSY
PREFILLED_SYRINGE | INTRAVENOUS | Status: DC | PRN
  Administered 2024-09-12: 100 mg via INTRAVENOUS

## 2024-09-12 MED ORDER — EPHEDRINE 5 MG/ML INJ
INTRAVENOUS | Status: AC
  Filled 2024-09-12: qty 5

## 2024-09-12 MED ORDER — GLYCOPYRROLATE 0.2 MG/ML IJ SOLN
INTRAMUSCULAR | Status: AC
  Filled 2024-09-12: qty 1

## 2024-09-12 MED ORDER — PHENYLEPHRINE 80 MCG/ML (10ML) SYRINGE FOR IV PUSH (FOR BLOOD PRESSURE SUPPORT)
PREFILLED_SYRINGE | INTRAVENOUS | Status: AC
  Filled 2024-09-12: qty 10

## 2024-09-12 MED ORDER — HYDROMORPHONE HCL 1 MG/ML IJ SOLN
INTRAMUSCULAR | Status: AC
  Filled 2024-09-12: qty 1

## 2024-09-12 MED ORDER — BUPIVACAINE-EPINEPHRINE (PF) 0.5% -1:200000 IJ SOLN
INTRAMUSCULAR | Status: AC
  Filled 2024-09-12: qty 30

## 2024-09-12 MED ORDER — PROPOFOL 10 MG/ML IV BOLUS
INTRAVENOUS | Status: DC | PRN
  Administered 2024-09-12: 100 mg via INTRAVENOUS

## 2024-09-12 MED ORDER — IOHEXOL 300 MG/ML  SOLN
100.0000 mL | Freq: Once | INTRAMUSCULAR | Status: AC | PRN
  Administered 2024-09-12: 100 mL via INTRAVENOUS

## 2024-09-12 MED ORDER — MIDAZOLAM HCL 2 MG/2ML IJ SOLN
INTRAMUSCULAR | Status: AC
  Filled 2024-09-12: qty 2

## 2024-09-12 MED ORDER — DEXAMETHASONE SODIUM PHOSPHATE 10 MG/ML IJ SOLN
INTRAMUSCULAR | Status: DC | PRN
  Administered 2024-09-12: 10 mg via INTRAVENOUS

## 2024-09-12 MED ORDER — FENTANYL CITRATE (PF) 100 MCG/2ML IJ SOLN
INTRAMUSCULAR | Status: AC
  Filled 2024-09-12: qty 2

## 2024-09-12 MED ORDER — ONDANSETRON HCL 4 MG/2ML IJ SOLN
4.0000 mg | Freq: Once | INTRAMUSCULAR | Status: AC
Start: 2024-09-12 — End: 2024-09-12
  Administered 2024-09-12: 4 mg via INTRAVENOUS
  Filled 2024-09-12: qty 2

## 2024-09-12 MED ORDER — CEFAZOLIN SODIUM 1 G IJ SOLR
INTRAMUSCULAR | Status: AC
  Filled 2024-09-12: qty 20

## 2024-09-12 MED ORDER — EPHEDRINE SULFATE (PRESSORS) 50 MG/ML IJ SOLN
INTRAMUSCULAR | Status: DC | PRN
  Administered 2024-09-12: 5 mg via INTRAVENOUS

## 2024-09-12 MED ORDER — PROPOFOL 10 MG/ML IV BOLUS
INTRAVENOUS | Status: AC
  Filled 2024-09-12: qty 20

## 2024-09-12 MED ORDER — FENTANYL CITRATE (PF) 100 MCG/2ML IJ SOLN
INTRAMUSCULAR | Status: DC | PRN
  Administered 2024-09-12 (×4): 50 ug via INTRAVENOUS

## 2024-09-12 MED ORDER — LIDOCAINE HCL (PF) 2 % IJ SOLN
INTRAMUSCULAR | Status: AC
  Filled 2024-09-12: qty 5

## 2024-09-12 MED ORDER — CEFAZOLIN SODIUM-DEXTROSE 2-4 GM/100ML-% IV SOLN
2.0000 g | Freq: Once | INTRAVENOUS | Status: AC
  Administered 2024-09-12 – 2024-09-13 (×2): 2 g via INTRAVENOUS

## 2024-09-12 MED ORDER — ACETAMINOPHEN 10 MG/ML IV SOLN
INTRAVENOUS | Status: DC | PRN
  Administered 2024-09-12: 1000 mg via INTRAVENOUS

## 2024-09-12 MED ORDER — ACETAMINOPHEN 10 MG/ML IV SOLN
INTRAVENOUS | Status: AC
  Filled 2024-09-12: qty 100

## 2024-09-12 MED ORDER — ROCURONIUM BROMIDE 10 MG/ML (PF) SYRINGE
PREFILLED_SYRINGE | INTRAVENOUS | Status: AC
  Filled 2024-09-12: qty 10

## 2024-09-12 MED ORDER — HYDROMORPHONE HCL 1 MG/ML IJ SOLN
INTRAMUSCULAR | Status: DC | PRN
  Administered 2024-09-12 (×2): .5 mg via INTRAVENOUS

## 2024-09-12 MED ORDER — PHENYLEPHRINE HCL (PRESSORS) 10 MG/ML IV SOLN
INTRAVENOUS | Status: DC | PRN
  Administered 2024-09-12 (×3): 160 ug via INTRAVENOUS

## 2024-09-12 MED ORDER — GLYCOPYRROLATE 0.2 MG/ML IJ SOLN
INTRAMUSCULAR | Status: DC | PRN
  Administered 2024-09-12: .2 mg via INTRAVENOUS

## 2024-09-12 MED ORDER — ROCURONIUM BROMIDE 100 MG/10ML IV SOLN
INTRAVENOUS | Status: DC | PRN
  Administered 2024-09-12: 50 mg via INTRAVENOUS
  Administered 2024-09-12 (×2): 20 mg via INTRAVENOUS

## 2024-09-12 MED ORDER — LACTATED RINGERS IV SOLN: INTRAVENOUS | Status: DC | PRN

## 2024-09-12 MED ORDER — MIDAZOLAM HCL 2 MG/2ML IJ SOLN
INTRAMUSCULAR | Status: DC | PRN
  Administered 2024-09-12: 2 mg via INTRAVENOUS

## 2024-09-12 MED ORDER — SEVOFLURANE IN SOLN
RESPIRATORY_TRACT | Status: AC
  Filled 2024-09-12: qty 250

## 2024-09-12 MED ORDER — ONDANSETRON HCL 4 MG/2ML IJ SOLN
INTRAMUSCULAR | Status: AC
  Filled 2024-09-12: qty 2

## 2024-09-12 MED ORDER — ONDANSETRON HCL 4 MG/2ML IJ SOLN
INTRAMUSCULAR | Status: DC | PRN
  Administered 2024-09-12: 4 mg via INTRAVENOUS

## 2024-09-12 MED ORDER — LIDOCAINE HCL (CARDIAC) PF 100 MG/5ML IV SOSY
PREFILLED_SYRINGE | INTRAVENOUS | Status: DC | PRN
  Administered 2024-09-12: 30 mg via INTRAVENOUS

## 2024-09-12 SURGICAL SUPPLY — 39 items
BINDER ABDOMINAL 12 ML 46-62 (SOFTGOODS) IMPLANT
CANISTER PREVENA 45 (CANNISTER) IMPLANT
CHLORAPREP W/TINT 26 (MISCELLANEOUS) IMPLANT
DRAPE LAPAROTOMY 100X77 ABD (DRAPES) ×2 IMPLANT
DRSG OPSITE POSTOP 4X10 (GAUZE/BANDAGES/DRESSINGS) IMPLANT
DRSG OPSITE POSTOP 4X8 (GAUZE/BANDAGES/DRESSINGS) IMPLANT
ELECT BLADE 6.5 EXT (BLADE) ×2 IMPLANT
ELECTRODE REM PT RTRN 9FT ADLT (ELECTROSURGICAL) ×2 IMPLANT
GLOVE BIOGEL PI IND STRL 7.5 (GLOVE) ×2 IMPLANT
GLOVE SURG SYN 7.0 PF PI (GLOVE) ×2 IMPLANT
GOWN STRL REUS W/ TWL LRG LVL3 (GOWN DISPOSABLE) ×4 IMPLANT
KIT PREVENA INCISION MGT20CM45 (CANNISTER) IMPLANT
KIT TURNOVER KIT A (KITS) ×2 IMPLANT
LABEL OR SOLS (LABEL) ×2 IMPLANT
LIGASURE IMPACT 36 18CM CVD LR (INSTRUMENTS) IMPLANT
MANIFOLD NEPTUNE II (INSTRUMENTS) ×2 IMPLANT
MESH VICRYL KNITTED 12X12 (Mesh General) IMPLANT
NDL HYPO 22X1.5 SAFETY MO (MISCELLANEOUS) ×2 IMPLANT
NEEDLE HYPO 22X1.5 SAFETY MO (MISCELLANEOUS) ×2 IMPLANT
NS IRRIG 1000ML POUR BTL (IV SOLUTION) ×2 IMPLANT
PACK BASIN MAJOR ARMC (MISCELLANEOUS) ×2 IMPLANT
PACK COLON CLEAN CLOSURE (MISCELLANEOUS) ×2 IMPLANT
RELOAD STAPLE 75 3.8 BLU REG (ENDOMECHANICALS) IMPLANT
RETAINER VISCERA MED (MISCELLANEOUS) IMPLANT
RETRACTOR WND ALEXIS-O 25 LRG (MISCELLANEOUS) IMPLANT
RETRACTOR WOUND ALXS 18CM MED (MISCELLANEOUS) IMPLANT
SPONGE T-LAP 18X18 ~~LOC~~+RFID (SPONGE) IMPLANT
STAPLER GUN LINEAR PROX 60 (STAPLE) IMPLANT
STAPLER PROXIMATE 75MM BLUE (STAPLE) IMPLANT
STAPLER SKIN PROX 35W (STAPLE) ×2 IMPLANT
SUT PDS AB 0 CT1 27 (SUTURE) IMPLANT
SUT PDS PLUS AB 0 CT-2 (SUTURE) IMPLANT
SUT SILK 2-0 18XBRD TIE 12 (SUTURE) IMPLANT
SUT SILK 3 0 SH CR/8 (SUTURE) IMPLANT
SUT VIC AB 3-0 SH 27X BRD (SUTURE) ×2 IMPLANT
SYR 20ML LL LF (SYRINGE) ×4 IMPLANT
TRAP FLUID SMOKE EVACUATOR (MISCELLANEOUS) ×2 IMPLANT
TRAY FOLEY MTR SLVR 16FR STAT (SET/KITS/TRAYS/PACK) ×2 IMPLANT
WATER STERILE IRR 500ML POUR (IV SOLUTION) ×2 IMPLANT

## 2024-09-12 NOTE — Consult Note (Signed)
 Patient ID: Brianna [name], female   DOB: 1961-04-06, 63 y.o.   MRN: [id number] CC: Incarcerated Hernia, SBO within Hernia History of Present Illness Brianna [name] is a 63 y.o. female with past medical history of heart failure, end-stage renal disease on dialysis with last dialysis session on September 12 with a port, history of exploratory laparotomy with small bowel resection and hernia repair 4 years ago who presents in consultation for abdominal pain.  The patient reports that yesterday she noticed a large abdominal bulge.  This was associated with nausea and vomiting.  She is also obstipated.  Her last bowel movement was yesterday.  She denies any overlying skin changes to the hernia.  She has been trying to reduce it herself at home but was unable to do this.  She reports that prior to this she did not notice any bulges in her abdomen..  Past Medical History Past Medical History:  Diagnosis Date   Acid reflux    Anasarca    Anemia    Asthma    well controlled   Bilateral leg edema    Bronchopneumonia    Chronic heart failure with preserved ejection fraction (HFpEF) (HCC)    a. 12/2020 Echo: EF 70-75%, no rwma, mild LVH, GrI DD, nl RV fxn, triv MR/AI.   CKD (chronic kidney disease), stage IV (HCC)    DM (diabetes mellitus), type 2 (HCC)    Dyspnea    Hypertension    Hypokalemia    Morbid obesity (HCC)    Strangulated ventral hernia        Past Surgical History:  Procedure Laterality Date   A/V FISTULAGRAM Left 01/28/2024   Procedure: A/V Fistulagram;  Surgeon: Jama Cordella MATSU, MD;  Location: [facility];  Service: Cardiovascular;  Laterality: Left;   A/V FISTULAGRAM Left 04/14/2024   Procedure: A/V Fistulagram;  Surgeon: Jama Cordella MATSU, MD;  Location: [facility];  Service: Cardiovascular;  Laterality: Left;   AV FISTULA PLACEMENT Left 08/08/2023   Procedure: ARTERIOVENOUS (AV) FISTULA CREATION (RADIALCEPHALIC);  Surgeon: Marea Selinda RAMAN, MD;   Location: [facility];  Service: Vascular;  Laterality: Left;  regional with MAC   BOWEL RESECTION  02/09/2020   Procedure: SMALL BOWEL RESECTION;  Surgeon: Desiderio Schanz, MD;  Location: [facility];  Service: General;;   DIALYSIS/PERMA CATHETER INSERTION N/A 05/28/2023   Procedure: DIALYSIS/PERMA CATHETER INSERTION;  Surgeon: Jama Cordella MATSU, MD;  Location: [facility];  Service: Cardiovascular;  Laterality: N/A;   LAPAROTOMY N/A 02/09/2020   Procedure: EXPLORATORY LAPAROTOMY;  Surgeon: Desiderio Schanz, MD;  Location: [facility];  Service: General;  Laterality: N/A;    Allergies  Allergen Reactions   Ace Inhibitors Anaphylaxis   Lisinopril Anaphylaxis   Lipitor [Atorvastatin] Other (See Comments)    Muscle aches    No current facility-administered medications for this encounter.   Current Outpatient Medications  Medication Sig Dispense Refill   albuterol  (PROVENTIL ) (2.5 MG/3ML) 0.083% nebulizer solution Take 2.5 mg by nebulization every 4 (four) hours as needed for shortness of breath or wheezing.     albuterol  (VENTOLIN  HFA) 108 (90 Base) MCG/ACT inhaler Inhale 1-2 puffs into the lungs every 6 (six) hours as needed for wheezing or shortness of breath.     allopurinol  (ZYLOPRIM ) 300 MG tablet Take 300 mg by mouth every morning.     amLODipine  (NORVASC ) 5 MG tablet Take 5 mg by mouth daily.     ASPIRIN  ADULT LOW STRENGTH 81 MG EC tablet  Take 81 mg by mouth daily.     carvedilol  (COREG ) 12.5 MG tablet Take 1 tablet (12.5 mg total) by mouth 2 (two) times daily before a meal. 60 tablet 0   cetirizine (ZYRTEC) 10 MG tablet Take 10 mg by mouth every morning.     cetirizine-pseudoephedrine (ZYRTEC-D) 5-120 MG tablet Take 1 tablet by mouth 2 (two) times daily.     colchicine 0.6 MG tablet Take by mouth.     cyanocobalamin  (VITAMIN B12) 1000 MCG tablet Take 1 tablet (1,000 mcg total) by mouth daily. 15 tablet 0   FEROSUL 325 (65 Fe) MG tablet Take 325 mg by mouth every other day.     furosemide   (LASIX ) 80 MG tablet Take 1 tablet (80 mg total) by mouth daily. For leg swelling or weight gain >5lbs (Patient taking differently: Take 80 mg by mouth every morning. For leg swelling or weight gain >5lbs) 30 tablet 2   hydrALAZINE  (APRESOLINE ) 50 MG tablet Take 1 tablet (50 mg total) by mouth every 8 (eight) hours. 90 tablet 0   HYDROcodone -acetaminophen  (NORCO/VICODIN) 5-325 MG tablet Take 1 tablet by mouth 2 (two) times daily as needed for severe pain (pain score 7-10). Must last 30 days 60 tablet 0   hydrOXYzine  (ATARAX ) 50 MG tablet Take 50 mg by mouth daily.     isosorbide  mononitrate (IMDUR ) 30 MG 24 hr tablet Take 1 tablet (30 mg total) by mouth 2 (two) times daily. 60 tablet 0   JANUVIA 100 MG tablet Take 100 mg by mouth every morning.     lactulose  (CHRONULAC ) 10 GM/15ML solution SMARTSIG:Milliliter(s) By Mouth     omeprazole (PRILOSEC) 20 MG capsule Take 20 mg by mouth every morning.     OZEMPIC, 0.25 OR 0.5 MG/DOSE, 2 MG/3ML SOPN      sertraline  (ZOLOFT ) 25 MG tablet Take 25 mg by mouth daily.     sevelamer  carbonate (RENVELA ) 800 MG tablet Take 1,600 mg by mouth 3 (three) times daily.     triamcinolone ointment (KENALOG) 0.5 % Apply 1 Application topically 2 (two) times daily.     Vitamin D , Ergocalciferol , (DRISDOL) 1.25 MG (50000 UNIT) CAPS capsule Take 50,000 Units by mouth once a week.     ZETIA  10 MG tablet Take 10 mg by mouth every morning.     zolpidem  (AMBIEN ) 5 MG tablet Take 1 tablet (5 mg total) by mouth at bedtime as needed for sleep. 5 tablet 0    Family History Family History  Problem Relation Age of Onset   Breast cancer Neg Hx        Social History Social History   Tobacco Use   Smoking status: Never   Smokeless tobacco: Never  Vaping Use   Vaping status: Never Used  Substance Use Topics   Alcohol use: Never   Drug use: Never        ROS Full ROS of systems performed and is otherwise negative there than what is stated in the HPI  Physical  Exam Blood pressure (!) 169/91, pulse 87, temperature 99.6 F (37.6 C), temperature source Oral, resp. rate 17, height 4' 9 (1.448 m), weight 81.6 kg, SpO2 97%.  Alert and oriented x 3, normal work of breathing on room air, abdomen is distended with an obvious large bulge.  She has multiple hernias in her abdomen.  I was able to reduce some inferior hernias but there is a superior hernia that is indurated and I was unable to reduce fully.  She  has a well-healed midline scar.  She has a port in her right upper chest. Data Reviewed Labs reviewed and significant for a normal white count and no anemia.  Her potassium is within normal limits.  Her CT scan is consistent with a multitude of hernias in her abdominal wall with the superior hernias containing small bowel with a transition point within the hernia and proximal dilation consistent with obstruction within the hernia.  I have personally reviewed the patient's imaging and medical records.    Assessment    63 year old female with end-stage renal disease and congestive heart failure presents again with incarcerated ventral hernia with a transition point within the hernia sac.  I was unable to reduce it at the bedside.  We will need to proceed to the operating room for exploratory laparotomy, reduction of hernia and possible small bowel resection and repair of the hernias.  I discussed the risk, benefits alternatives of the procedure including risk infection, bleeding, fistula formation, need for resection, recurrence of hernia.  If there is not a large degree of contamination may need to use a piece of mesh versus potential Vicryl mesh.  She understands these risks and wishes to proceed with surgery.  She is also okay with blood products should she need them. Ancef  on call to OR and NG to be placed.     Jayson MALVA Endow 09/12/2024, 7:56 PM

## 2024-09-12 NOTE — Anesthesia Preprocedure Evaluation (Signed)
 Anesthesia Evaluation  Patient identified by MRN, date of birth, ID band Patient awake    Reviewed: Allergy & Precautions, NPO status , Patient's Chart, lab work & pertinent test results  History of Anesthesia Complications Negative for: history of anesthetic complications  Airway Mallampati: IV   Neck ROM: Full    Dental  (+) Missing, Poor Dentition, Dental Advidsory Given   Pulmonary neg shortness of breath, asthma , neg COPD, neg recent URI   Pulmonary exam normal breath sounds clear to auscultation       Cardiovascular hypertension, (-) angina +CHF  (-) Past MI and (-) Cardiac Stents Normal cardiovascular exam(-) dysrhythmias (-) Valvular Problems/Murmurs Rhythm:Regular Rate:Normal  ECG 05/23/23: SR with occasional PVCs; LAD; low voltage; no ST changes   Neuro/Psych  PSYCHIATRIC DISORDERS Anxiety Depression    negative neurological ROS     GI/Hepatic Neg liver ROS,GERD  ,,  Endo/Other  diabetes, Type 2  Class 3 obesity  Renal/GU ESRF and DialysisRenal disease (stage V CKD)     Musculoskeletal   Abdominal   Peds  Hematology  (+) Blood dyscrasia, anemia   Anesthesia Other Findings Past Medical History: No date: Acid reflux No date: Anasarca No date: Anemia No date: Asthma     Comment:  well controlled No date: Bilateral leg edema No date: Bronchopneumonia No date: Chronic heart failure with preserved ejection fraction  (HFpEF) (HCC)     Comment:  a. 12/2020 Echo: EF 70-75%, no rwma, mild LVH, GrI DD, nl              RV fxn, triv MR/AI. No date: CKD (chronic kidney disease), stage IV (HCC) No date: DM (diabetes mellitus), type 2 (HCC) No date: Dyspnea No date: Hypertension No date: Hypokalemia No date: Morbid obesity (HCC) No date: Strangulated ventral hernia   Reproductive/Obstetrics                              Anesthesia Physical Anesthesia Plan  ASA: 4  Anesthesia Plan:  General   Post-op Pain Management:    Induction: Intravenous, Rapid sequence and Cricoid pressure planned  PONV Risk Score and Plan: 3 and Treatment may vary due to age or medical condition and Ondansetron   Airway Management Planned: Oral ETT  Additional Equipment:   Intra-op Plan:   Post-operative Plan: Extubation in OR  Informed Consent: I have reviewed the patients History and Physical, chart, labs and discussed the procedure including the risks, benefits and alternatives for the proposed anesthesia with the patient or authorized representative who has indicated his/her understanding and acceptance.       Plan Discussed with: CRNA  Anesthesia Plan Comments: (Plan for preoperative supraclavicular nerve block and GA with natural airway. LMA/GETA backup discussed.  Patient consented for risks of anesthesia including but not limited to:  - adverse reactions to medications - damage to eyes, teeth, lips or other oral mucosa - nerve damage due to positioning  - sore throat or hoarseness - damage to heart, brain, nerves, lungs, other parts of body or loss of life  Informed patient about role of CRNA in peri- and intra-operative care.  Patient voiced understanding.)         Anesthesia Quick Evaluation

## 2024-09-12 NOTE — ED Notes (Signed)
 First nurse note: [facility] from home, abdominal pain SOB N/V and chest pain since yesterday and a 'lump' to her abdomen, had similar symptoms another time and had abdominal hernia  194/94, recently taken off BP med. Is on dialysis, M W F  12 lead nml, HR 90s CBG 210  Last dialysis yesterday, no missed sessions L arm restricted

## 2024-09-12 NOTE — ED Provider Notes (Signed)
 [facility] Provider Note    Event Date/Time   First MD Initiated Contact with Patient 09/12/24 1530     (approximate)   History   Emesis and Chest Pain   HPI  Brianna [name] is a 63 y.o. female who presents to the emergency department today because of concerns for nausea vomiting and abdominal pain.  Symptoms started 2 days ago.  She has also noticed a bulge to her abdomen.  Last bowel movement was yesterday and patient is not passing gas today.  The patient states that she has a history of hernia requiring surgical correction.  She feels like this is similar to what happened at that time.  She denies any fevers.     Physical Exam   Triage Vital Signs: ED Triage Vitals  Encounter Vitals Group     BP 09/12/24 1457 (!) 154/75     Girls Systolic BP Percentile --      Girls Diastolic BP Percentile --      Boys Systolic BP Percentile --      Boys Diastolic BP Percentile --      Pulse Rate 09/12/24 1457 88     Resp 09/12/24 1457 20     Temp 09/12/24 1457 99.6 F (37.6 C)     Temp Source 09/12/24 1457 Oral     SpO2 09/12/24 1457 97 %     Weight 09/12/24 1447 180 lb (81.6 kg)     Height 09/12/24 1447 4' 9 (1.448 m)     Head Circumference --      Peak Flow --      Pain Score 09/12/24 1447 10     Pain Loc --      Pain Education --      Exclude from Growth Chart --     Most recent vital signs: Vitals:   09/12/24 1457  BP: (!) 154/75  Pulse: 88  Resp: 20  Temp: 99.6 F (37.6 C)  SpO2: 97%   General: Awake, alert, oriented. CV:  Good peripheral perfusion. Regular rate and rhythm. Resp:  Normal effort. Lungs clear. Abd:  Bulge to mid abdomen, consistent with hernia, tender to palpation.   ED Results / Procedures / Treatments   Labs (all labs ordered are listed, but only abnormal results are displayed) Labs Reviewed  BASIC METABOLIC PANEL WITH GFR - Abnormal; Notable for the following components:      Result Value   Chloride 93 (*)     Glucose, Bld 190 (*)    BUN 44 (*)    Creatinine, Ser 7.58 (*)    GFR, Estimated 6 (*)    Anion gap 23 (*)    All other components within normal limits  HEPATIC FUNCTION PANEL - Abnormal; Notable for the following components:   Total Protein 8.7 (*)    All other components within normal limits  TROPONIN I (HIGH SENSITIVITY) - Abnormal; Notable for the following components:   Troponin I (High Sensitivity) 20 (*)    All other components within normal limits  CBC  LIPASE, BLOOD  TROPONIN I (HIGH SENSITIVITY)     EKG  I, Guadalupe Eagles, attending physician, personally viewed and interpreted this EKG  EKG Time: 1449 Rate: 87 Rhythm: sinus rhythm with PVC Axis: left axis deviation Intervals: qtc 409 QRS: narrow, q waves II, III, aVF, V4-V6 ST changes: no st elevation Impression: abnormal ekg   RADIOLOGY I independently interpreted and visualized the CXR. My interpretation: cardiomegaly, no pneumonia Radiology  interpretation:  IMPRESSION:  1. No acute cardiopulmonary pathology related to the clinical history of chest  pain and vomiting.  2. Tunneled right IJ hemodialysis catheter in place with tip extending to the  proximal right atrium.  3. Aortic atherosclerosis.   I independently interpreted and visualized the CT abd/pel. My interpretation: abdominal wall hernia Radiology interpretation:  IMPRESSION:  1. Multiple consecutive ventral wall hernias with the 2  inferior-most hernias demonstrating small-bowel obstructions  (including a close loop) as described above and below. No  pneumatosis. No bowel perforation.  2. Small-bowel obstruction of a long loop of small bowel with  transition point at its exit site from a large paraumbilical ventral  wall hernia containing multiple loops of small bowel.  3. Closed loop obstruction of a short loop of small bowel contained  within the inferior-most infraumbilical wall hernia. Recommend  emergent surgical consultation.  4.  Couple consecutive supraumbilical ventral wall hernias containing  large bowel and fat. No findings to suggest incarceration, bowel  obstruction, or ischemia with these hernias.  5. Likely mixed type small hiatal hernia.  6. Colonic diverticulosis with no acute diverticulitis.  7. Large 8.6 cm degenerative uterine fundal fibroid.   I, Guadalupe Eagles, personally discussed these images (CT abd/pel) and results by phone with the on-call radiologist and used this discussion as part of my medical decision making.     PROCEDURES:  Critical Care performed: Yes  CRITICAL CARE Performed by: Guadalupe Eagles   Total critical care time: 30 minutes  Critical care time was exclusive of separately billable procedures and treating other patients.  Critical care was necessary to treat or prevent imminent or life-threatening deterioration.  Critical care was time spent personally by me on the following activities: development of treatment plan with patient and/or surrogate as well as nursing, discussions with consultants, evaluation of patient's response to treatment, examination of patient, obtaining history from patient or surrogate, ordering and performing treatments and interventions, ordering and review of laboratory studies, ordering and review of radiographic studies, pulse oximetry and re-evaluation of patient's condition.   Procedures    MEDICATIONS ORDERED IN ED: Medications - No data to display   IMPRESSION / MDM / ASSESSMENT AND PLAN / ED COURSE  I reviewed the triage vital signs and the nursing notes.                              Differential diagnosis includes, but is not limited to, SBO, hernia, viral illness  Patient's presentation is most consistent with acute presentation with potential threat to life or bodily function.   The patient is on the cardiac monitor to evaluate for evidence of arrhythmia and/or significant heart rate changes.  The patient presents to the  emergency department today because of concerns for abdominal pain, bulge, nausea and vomiting.  Patient with history of hernia.  Exam is consistent with a mid abdominal hernia.  Bedside attempt at reduction was unsuccessful.  Will obtain CT scan.  Blood work without significant leukocytosis.  CT scan is concerning for multiple hernias including incarcerated hernia resulting in SBO.  Discussed directly with the radiologist.  Discussed findings with patient. Discussed with Dr. Marinda with the surgery team who will evaluate.     FINAL CLINICAL IMPRESSION(S) / ED DIAGNOSES   Final diagnoses:  Hernia of abdominal wall  SBO (small bowel obstruction) (HCC)     Note:  This document was prepared using Conservation officer, historic buildings  and may include unintentional dictation errors.    Floy Roberts, MD 09/12/24 [phone]

## 2024-09-12 NOTE — Anesthesia Procedure Notes (Signed)
 Procedure Name: Intubation Date/Time: 09/12/2024 8:33 PM  Performed by: Leontine Katz, CRNAPre-anesthesia Checklist: Patient identified, Patient being monitored, Timeout performed, Emergency Drugs available and Suction available Patient Re-evaluated:Patient Re-evaluated prior to induction Oxygen Delivery Method: Circle system utilized Preoxygenation: Pre-oxygenation with 100% oxygen Induction Type: IV induction, Rapid sequence and Cricoid Pressure applied Laryngoscope Size: 3 and McGrath Grade View: Grade I Tube type: Oral Tube size: 7.0 mm Number of attempts: 1 Airway Equipment and Method: Stylet and Video-laryngoscopy Placement Confirmation: ETT inserted through vocal cords under direct vision, positive ETCO2 and breath sounds checked- equal and bilateral Secured at: 21 cm Tube secured with: Tape Dental Injury: Teeth and Oropharynx as per pre-operative assessment

## 2024-09-12 NOTE — ED Triage Notes (Signed)
 Pt BIB EMS from home with complaints of abd pain diarrhea and vomiting that started yesterday and chest pain started today.

## 2024-09-13 ENCOUNTER — Other Ambulatory Visit: Payer: Self-pay

## 2024-09-13 DIAGNOSIS — N186 End stage renal disease: Secondary | ICD-10-CM | POA: Diagnosis present

## 2024-09-13 DIAGNOSIS — K219 Gastro-esophageal reflux disease without esophagitis: Secondary | ICD-10-CM | POA: Diagnosis present

## 2024-09-13 DIAGNOSIS — M109 Gout, unspecified: Secondary | ICD-10-CM | POA: Diagnosis present

## 2024-09-13 DIAGNOSIS — E872 Acidosis, unspecified: Secondary | ICD-10-CM | POA: Diagnosis present

## 2024-09-13 DIAGNOSIS — K436 Other and unspecified ventral hernia with obstruction, without gangrene: Secondary | ICD-10-CM | POA: Diagnosis present

## 2024-09-13 DIAGNOSIS — R579 Shock, unspecified: Secondary | ICD-10-CM | POA: Diagnosis present

## 2024-09-13 DIAGNOSIS — E66812 Obesity, class 2: Secondary | ICD-10-CM | POA: Diagnosis present

## 2024-09-13 DIAGNOSIS — K9189 Other postprocedural complications and disorders of digestive system: Secondary | ICD-10-CM | POA: Diagnosis not present

## 2024-09-13 DIAGNOSIS — I2489 Other forms of acute ischemic heart disease: Secondary | ICD-10-CM | POA: Diagnosis present

## 2024-09-13 DIAGNOSIS — I132 Hypertensive heart and chronic kidney disease with heart failure and with stage 5 chronic kidney disease, or end stage renal disease: Secondary | ICD-10-CM | POA: Diagnosis present

## 2024-09-13 DIAGNOSIS — J45909 Unspecified asthma, uncomplicated: Secondary | ICD-10-CM | POA: Diagnosis present

## 2024-09-13 DIAGNOSIS — Z7982 Long term (current) use of aspirin: Secondary | ICD-10-CM | POA: Diagnosis not present

## 2024-09-13 DIAGNOSIS — Z23 Encounter for immunization: Secondary | ICD-10-CM | POA: Diagnosis not present

## 2024-09-13 DIAGNOSIS — I3139 Other pericardial effusion (noninflammatory): Secondary | ICD-10-CM | POA: Diagnosis not present

## 2024-09-13 DIAGNOSIS — E1122 Type 2 diabetes mellitus with diabetic chronic kidney disease: Secondary | ICD-10-CM | POA: Diagnosis present

## 2024-09-13 DIAGNOSIS — Z992 Dependence on renal dialysis: Secondary | ICD-10-CM | POA: Diagnosis not present

## 2024-09-13 DIAGNOSIS — N2581 Secondary hyperparathyroidism of renal origin: Secondary | ICD-10-CM | POA: Diagnosis present

## 2024-09-13 DIAGNOSIS — K439 Ventral hernia without obstruction or gangrene: Secondary | ICD-10-CM | POA: Diagnosis not present

## 2024-09-13 DIAGNOSIS — J9601 Acute respiratory failure with hypoxia: Secondary | ICD-10-CM | POA: Diagnosis not present

## 2024-09-13 DIAGNOSIS — D631 Anemia in chronic kidney disease: Secondary | ICD-10-CM | POA: Diagnosis present

## 2024-09-13 DIAGNOSIS — G9341 Metabolic encephalopathy: Secondary | ICD-10-CM | POA: Diagnosis not present

## 2024-09-13 DIAGNOSIS — K430 Incisional hernia with obstruction, without gangrene: Secondary | ICD-10-CM | POA: Diagnosis not present

## 2024-09-13 DIAGNOSIS — I5032 Chronic diastolic (congestive) heart failure: Secondary | ICD-10-CM | POA: Diagnosis present

## 2024-09-13 DIAGNOSIS — K567 Ileus, unspecified: Secondary | ICD-10-CM | POA: Diagnosis not present

## 2024-09-13 DIAGNOSIS — G894 Chronic pain syndrome: Secondary | ICD-10-CM | POA: Diagnosis present

## 2024-09-13 DIAGNOSIS — G4733 Obstructive sleep apnea (adult) (pediatric): Secondary | ICD-10-CM | POA: Diagnosis present

## 2024-09-13 DIAGNOSIS — K56609 Unspecified intestinal obstruction, unspecified as to partial versus complete obstruction: Secondary | ICD-10-CM | POA: Diagnosis present

## 2024-09-13 LAB — BASIC METABOLIC PANEL WITH GFR
Anion gap: 28 — ABNORMAL HIGH (ref 5–15)
BUN: 57 mg/dL — ABNORMAL HIGH (ref 8–23)
CO2: 20 mmol/L — ABNORMAL LOW (ref 22–32)
Calcium: 9.6 mg/dL (ref 8.9–10.3)
Chloride: 94 mmol/L — ABNORMAL LOW (ref 98–111)
Creatinine, Ser: 8.81 mg/dL — ABNORMAL HIGH (ref 0.44–1.00)
GFR, Estimated: 5 mL/min — ABNORMAL LOW (ref >60)
Glucose, Bld: 238 mg/dL — ABNORMAL HIGH (ref 70–99)
Potassium: 4.4 mmol/L (ref 3.5–5.1)
Sodium: 142 mmol/L (ref 135–145)

## 2024-09-13 LAB — CBC
HCT: 40.2 % (ref 36.0–46.0)
Hemoglobin: 12.8 g/dL (ref 12.0–15.0)
MCH: 29.9 pg (ref 26.0–34.0)
MCHC: 31.8 g/dL (ref 30.0–36.0)
MCV: 93.9 fL (ref 80.0–100.0)
Platelets: 332 K/uL (ref 150–400)
RBC: 4.28 MIL/uL (ref 3.87–5.11)
RDW: 15.8 % — ABNORMAL HIGH (ref 11.5–15.5)
WBC: 20 K/uL — ABNORMAL HIGH (ref 4.0–10.5)
nRBC: 0 % (ref 0.0–0.2)

## 2024-09-13 LAB — TSH: TSH: 4.814 u[IU]/mL — ABNORMAL HIGH (ref 0.350–4.500)

## 2024-09-13 LAB — HIV ANTIBODY (ROUTINE TESTING W REFLEX): HIV Screen 4th Generation wRfx: NONREACTIVE

## 2024-09-13 LAB — HEMOGLOBIN A1C
Hgb A1c MFr Bld: 5.6 % (ref 4.8–5.6)
Mean Plasma Glucose: 114.02 mg/dL

## 2024-09-13 LAB — GLUCOSE, CAPILLARY
Glucose-Capillary: 281 mg/dL — ABNORMAL HIGH (ref 70–99)
Glucose-Capillary: 227 mg/dL — ABNORMAL HIGH (ref 70–99)
Glucose-Capillary: 229 mg/dL — ABNORMAL HIGH (ref 70–99)
Glucose-Capillary: 218 mg/dL — ABNORMAL HIGH (ref 70–99)
Glucose-Capillary: 187 mg/dL — ABNORMAL HIGH (ref 70–99)
Glucose-Capillary: 170 mg/dL — ABNORMAL HIGH (ref 70–99)

## 2024-09-13 LAB — TYPE AND SCREEN
ABO/RH(D): O POS
Antibody Screen: NEGATIVE

## 2024-09-13 MED ORDER — INSULIN ASPART 100 UNIT/ML IJ SOLN: 0.0000 [IU] | INTRAMUSCULAR | Status: DC

## 2024-09-13 MED ORDER — SEVELAMER CARBONATE 800 MG PO TABS
1600.0000 mg | ORAL_TABLET | Freq: Three times a day (TID) | ORAL | Status: DC
Start: 2024-09-13 — End: 2024-09-15
  Administered 2024-09-14 (×3): 1600 mg via ORAL
  Filled 2024-09-13 (×4): qty 2

## 2024-09-13 MED ORDER — BUPIVACAINE-EPINEPHRINE (PF) 0.5% -1:200000 IJ SOLN
INTRAMUSCULAR | Status: DC | PRN
  Administered 2024-09-13: 30 mL via PERINEURAL

## 2024-09-13 MED ORDER — CEFAZOLIN SODIUM-DEXTROSE 1-4 GM/50ML-% IV SOLN
1.0000 g | Freq: Three times a day (TID) | INTRAVENOUS | Status: DC
Start: 2024-09-13 — End: 2024-09-13
  Administered 2024-09-13: 1 g via INTRAVENOUS
  Filled 2024-09-13 (×2): qty 50

## 2024-09-13 MED ORDER — INSULIN ASPART 100 UNIT/ML IJ SOLN
0.0000 [IU] | INTRAMUSCULAR | Status: DC
  Administered 2024-09-13 – 2024-09-14 (×4): 2 [IU] via SUBCUTANEOUS
  Administered 2024-09-14: 3 [IU] via SUBCUTANEOUS
  Administered 2024-09-14 (×2): 2 [IU] via SUBCUTANEOUS
  Administered 2024-09-15: 1 [IU] via SUBCUTANEOUS
  Administered 2024-09-15 (×2): 2 [IU] via SUBCUTANEOUS
  Administered 2024-09-16 – 2024-09-20 (×2): 1 [IU] via SUBCUTANEOUS
  Filled 2024-09-13 (×14): qty 1

## 2024-09-13 MED ORDER — DROPERIDOL 2.5 MG/ML IJ SOLN: 0.6250 mg | Freq: Once | INTRAMUSCULAR | Status: DC | PRN

## 2024-09-13 MED ORDER — DIAZEPAM 5 MG/ML IJ SOLN: 0.5000 mg | Freq: Once | INTRAMUSCULAR | Status: DC

## 2024-09-13 MED ORDER — BUPIVACAINE LIPOSOME 1.3 % IJ SUSP
INTRAMUSCULAR | Status: DC | PRN
  Administered 2024-09-13: 20 mL

## 2024-09-13 MED ORDER — FENTANYL CITRATE (PF) 100 MCG/2ML IJ SOLN: 25.0000 ug | INTRAMUSCULAR | Status: DC | PRN

## 2024-09-13 MED ORDER — CARVEDILOL 12.5 MG PO TABS
12.5000 mg | ORAL_TABLET | Freq: Two times a day (BID) | ORAL | Status: DC
Start: 2024-09-13 — End: 2024-09-13
  Filled 2024-09-13: qty 1

## 2024-09-13 MED ORDER — SERTRALINE HCL 50 MG PO TABS
25.0000 mg | ORAL_TABLET | Freq: Every day | ORAL | Status: DC
Start: 2024-09-13 — End: 2024-09-15
  Administered 2024-09-13 – 2024-09-15 (×3): 25 mg via ORAL
  Filled 2024-09-13 (×4): qty 1

## 2024-09-13 MED ORDER — LABETALOL HCL 5 MG/ML IV SOLN: 5.0000 mg | INTRAVENOUS | Status: DC | PRN

## 2024-09-13 MED ORDER — ZOLPIDEM TARTRATE 5 MG PO TABS: 5.0000 mg | ORAL_TABLET | Freq: Every evening | ORAL | Status: DC | PRN

## 2024-09-13 MED ORDER — SODIUM CHLORIDE 0.9 % IV SOLN
INTRAVENOUS | Status: DC
Start: 2024-09-13 — End: 2024-09-14

## 2024-09-13 MED ORDER — INSULIN ASPART 100 UNIT/ML IJ SOLN: 0.0000 [IU] | Freq: Every day | INTRAMUSCULAR | Status: DC

## 2024-09-13 MED ORDER — HALOPERIDOL LACTATE 5 MG/ML IJ SOLN
5.0000 mg | Freq: Once | INTRAMUSCULAR | Status: AC
Start: 2024-09-13 — End: 2024-09-13
  Administered 2024-09-13: 5 mg via INTRAVENOUS

## 2024-09-13 MED ORDER — CHLORHEXIDINE GLUCONATE CLOTH 2 % EX PADS
6.0000 | MEDICATED_PAD | Freq: Every day | CUTANEOUS | Status: DC
Start: 2024-09-13 — End: 2024-09-16
  Administered 2024-09-13 – 2024-09-16 (×4): 6 via TOPICAL

## 2024-09-13 MED ORDER — MIDAZOLAM HCL 2 MG/2ML IJ SOLN
0.5000 mg | Freq: Once | INTRAMUSCULAR | Status: AC
Start: 2024-09-13 — End: 2024-09-13
  Administered 2024-09-13: 0.5 mg via INTRAVENOUS

## 2024-09-13 MED ORDER — HYDRALAZINE HCL 20 MG/ML IJ SOLN: 5.0000 mg | INTRAMUSCULAR | Status: DC | PRN

## 2024-09-13 MED ORDER — HYDROXYZINE HCL 25 MG PO TABS
50.0000 mg | ORAL_TABLET | Freq: Every day | ORAL | Status: DC
Start: 2024-09-13 — End: 2024-09-15
  Administered 2024-09-13 – 2024-09-15 (×3): 50 mg via ORAL
  Filled 2024-09-13 (×3): qty 2

## 2024-09-13 MED ORDER — SUGAMMADEX SODIUM 200 MG/2ML IV SOLN
INTRAVENOUS | Status: DC | PRN
  Administered 2024-09-13: 200 mg via INTRAVENOUS

## 2024-09-13 MED ORDER — METHOCARBAMOL 1000 MG/10ML IJ SOLN
500.0000 mg | Freq: Three times a day (TID) | INTRAMUSCULAR | Status: DC
  Administered 2024-09-13 – 2024-09-14 (×3): 500 mg via INTRAVENOUS
  Filled 2024-09-13 (×5): qty 5

## 2024-09-13 MED ORDER — ALBUTEROL SULFATE (2.5 MG/3ML) 0.083% IN NEBU
2.5000 mg | INHALATION_SOLUTION | RESPIRATORY_TRACT | Status: DC | PRN
  Administered 2024-09-14 (×2): 2.5 mg via RESPIRATORY_TRACT
  Filled 2024-09-13 (×2): qty 3

## 2024-09-13 MED ORDER — HYDROMORPHONE HCL 1 MG/ML IJ SOLN
0.5000 mg | INTRAMUSCULAR | Status: DC | PRN
  Administered 2024-09-13 – 2024-09-18 (×13): 0.5 mg via INTRAVENOUS
  Filled 2024-09-13: qty 0.5
  Filled 2024-09-13 (×2): qty 1
  Filled 2024-09-13: qty 0.5
  Filled 2024-09-13: qty 1
  Filled 2024-09-13 (×2): qty 0.5
  Filled 2024-09-13 (×3): qty 1
  Filled 2024-09-13 (×2): qty 0.5
  Filled 2024-09-13: qty 1

## 2024-09-13 MED ORDER — PANTOPRAZOLE SODIUM 40 MG IV SOLR
40.0000 mg | INTRAVENOUS | Status: DC
  Administered 2024-09-13 – 2024-09-22 (×10): 40 mg via INTRAVENOUS
  Filled 2024-09-13 (×10): qty 10

## 2024-09-13 MED ORDER — EZETIMIBE 10 MG PO TABS
10.0000 mg | ORAL_TABLET | ORAL | Status: DC
Start: 2024-09-13 — End: 2024-09-15
  Administered 2024-09-13 – 2024-09-15 (×3): 10 mg via ORAL
  Filled 2024-09-13 (×5): qty 1

## 2024-09-13 MED ORDER — HALOPERIDOL LACTATE 5 MG/ML IJ SOLN
INTRAMUSCULAR | Status: AC
  Filled 2024-09-13: qty 1

## 2024-09-13 MED ORDER — ISOSORBIDE MONONITRATE ER 30 MG PO TB24
30.0000 mg | ORAL_TABLET | Freq: Two times a day (BID) | ORAL | Status: DC
Start: 2024-09-13 — End: 2024-09-13
  Administered 2024-09-13 (×2): 30 mg via ORAL
  Filled 2024-09-13 (×2): qty 1

## 2024-09-13 MED ORDER — INSULIN ASPART 100 UNIT/ML IJ SOLN
0.0000 [IU] | Freq: Three times a day (TID) | INTRAMUSCULAR | Status: DC
  Administered 2024-09-13 (×3): 5 [IU] via SUBCUTANEOUS
  Filled 2024-09-13 (×3): qty 1

## 2024-09-13 MED ORDER — CEFAZOLIN SODIUM 1 G IJ SOLR
INTRAMUSCULAR | Status: AC
  Filled 2024-09-13: qty 20

## 2024-09-13 MED ORDER — ONDANSETRON HCL 4 MG/2ML IJ SOLN
4.0000 mg | INTRAMUSCULAR | Status: DC | PRN
  Administered 2024-09-14: 4 mg via INTRAVENOUS
  Filled 2024-09-13 (×2): qty 2

## 2024-09-13 MED ORDER — ACETAMINOPHEN 10 MG/ML IV SOLN
1000.0000 mg | Freq: Four times a day (QID) | INTRAVENOUS | Status: AC
  Administered 2024-09-13 – 2024-09-14 (×4): 1000 mg via INTRAVENOUS
  Filled 2024-09-13 (×4): qty 100

## 2024-09-13 MED ORDER — MIDAZOLAM HCL 2 MG/2ML IJ SOLN
INTRAMUSCULAR | Status: AC
  Filled 2024-09-13: qty 2

## 2024-09-13 MED ORDER — HEPARIN SODIUM (PORCINE) 5000 UNIT/ML IJ SOLN
5000.0000 [IU] | Freq: Three times a day (TID) | INTRAMUSCULAR | Status: DC
  Administered 2024-09-13 – 2024-09-24 (×32): 5000 [IU] via SUBCUTANEOUS
  Filled 2024-09-13 (×32): qty 1

## 2024-09-13 MED ORDER — ALLOPURINOL 100 MG PO TABS
300.0000 mg | ORAL_TABLET | ORAL | Status: DC
Start: 2024-09-14 — End: 2024-09-14

## 2024-09-13 NOTE — Op Note (Signed)
 Operative note  Preoperative diagnosis: Incarcerated ventral hernia with obstruction Postoperative diagnosis: Incarcerated ventral hernia with obstruction Surgeon: Jayson Hobby, MD EBL: 50 cc Procedure: Exploratory laparotomy, reduction of incarcerated and obstructed hernia, repair of ventral hernias measuring approximately 15 cm, lysis of adhesions for 2 hours, omentectomy   Indications: This is a 63 year old lady with past surgical history significant for exploratory laparotomy with small bowel resection secondary to strangulated ventral hernia that presented to the emergency department with abdominal bulge and nausea and vomiting.  She had a CT scan that was concerning for an obstruction at the prior small bowel anastomosis within her hernia sac.  I was unable to reduce the hernia at bedside so she was taken to the operating room for exploratory laparotomy.  After informed consent was obtained the patient was brought to the operating room and placed supine on the operating room table.  General endotracheal anesthesia was then induced and her abdomen was then prepped and draped in the usual sterile fashion.  A Foley catheter was placed prior to prepping of the patient.  A surgical timeout was then called identifying correct patient, site, side and procedure.  A midline laparotomy incision was made starting at [place] and encompassing her previous laparotomy scar.  This was taken down superiorly through the subcutaneous tissue with Bovie cautery.  There were palpable multiple hernias and I was able to identify a strip of fascia in between these hernias.  The fascia was then grasped with hemostats and opened with Metzenbaum scissors as to protect any underlying viscera.  We were able to enter the abdomen in a place where there was no adhesed bowel.  In examining the abdomen there was again multiple hernias with adhesions to the anterior abdominal wall.  I was able to free up inferiorly down to  her previous incisions.  Here there did appear to be a hernia with dilated bowel going into the hernia.  I was able to extend the fascial incision up to this hernia and start to incise the hernia sac.  This was done with the Metzenbaums to protect underlying bowel.  Once this was opened I was able to reduce the anastomosis from her previous surgery.  The anastomosis was adhered to the hernia sac and the small bowel entering and exiting this hernia was also adhered to the anterior abdominal wall.  Once I was able to adequately reduce this hernia I was able to run the small bowel distally and there was decompressed bowel towards the ileocecal valve.  There were several other small hernias inferiorly but there was no incarcerated bowel in the small hernias.  I then was able to completely run the small bowel towards the ileocecal valve and identify the right colon.  A trace right colon the right colon appeared healthy and was able to go distally up towards the transverse colon.  It did appear that the transverse colon was incarcerated in a hernia superiorly.  I extended the fascial incision I had previously made superiorly and open the hernia sac and was able to reduce the transverse colon.  I was then able to follow the transverse colon down to the descending colon and there did not seem to be any further herniation of bowel contents.  The transverse colon was adhered to the small bowel so I performed a lysis of adhesions to separate the small bowel from the transverse colon so I could run the small bowel completely towards the ligament of Treitz.  In total this all  took about 2 hours of lysis of adhesion time to completely free up the bowel.  Once I was able to do that I did traced the small bowel from the anastomosis proximally towards the ligament of Treitz.  This bowel was dilated and erythematous but it was not ischemic or necrotic.  Once the whole small bowel and large bowel were completely freed up I was able to  identify the posterior sheath and peritoneum.  The patient did have complete loss of domain.  I was concerned about being able to close this hernia.  I did incise the posterior sheath bilaterally and create a retrorectus space towards the linea semilunaris.  Once I mobilized the posterior sheath I started to close the abdomen with 0 PDS suture.  However it was evident that there was still a loss of domain and I would be unable to completely close the posterior sheath.  Given her obesity and the emergent nature I elected not to perform a transversus abdominis release.  I fashioned a Vicryl mesh and used that to bridge between the posterior sheath bilaterally.  It was sewn to the bilateral edges of the posterior sheath with a 0 PDS suture.  I then reapproximated the anterior sheath and hernia sac using a running 0 PDS suture.The skin was then closed with staples and a prevena wound vace was placed. Prior to termination all sponge and instrument counts were correct x 2. The patient was extubated and taken to PACU in stable condition.

## 2024-09-13 NOTE — Brief Op Note (Signed)
 09/12/2024 - 09/13/2024  12:42 AM  PATIENT:  Olam Niels Pouch  63 y.o. female  PRE-OPERATIVE DIAGNOSIS:  incarcerated hernia  POST-OPERATIVE DIAGNOSIS:  incarcerated hernia  PROCEDURE:  Procedure(s): LAPAROTOMY, EXPLORATORY (N/A)  SURGEON:  Surgeons and Role:    * Marinda Jayson KIDD, MD - Primary  PHYSICIAN ASSISTANT:   ASSISTANTS: none   ANESTHESIA:   general  EBL:  50   BLOOD ADMINISTERED:none  DRAINS: none   LOCAL MEDICATIONS USED:  MARCAINE     and BUPIVICAINE   SPECIMEN:  Source of Specimen:  omentum  DISPOSITION OF SPECIMEN:  PATHOLOGY  COUNTS:  YES  TOURNIQUET:  * No tourniquets in log *  DICTATION: .Dragon Dictation  PLAN OF CARE: Admit to inpatient   PATIENT DISPOSITION:  PACU - hemodynamically stable.   Delay start of Pharmacological VTE agent (>24hrs) due to surgical blood loss or risk of bleeding: yes

## 2024-09-13 NOTE — Progress Notes (Signed)
 CC: Incarcerated Ventral Hernia Subjective: Patient taken to the operating room last night for incarcerated and obstructed ventral hernia.  She had multiple hernias and a large burden of adhesive disease.  Hernia repaired with vicryl mesh secondary to loss of domain.  This morning reports feeling sore and having pain.  She has been tachycardic overnight.  She has not been hypotensive.  Objective: Vital signs in last 24 hours: Temp:  [96.9 F (36.1 C)-99.6 F (37.6 C)] 98.7 F (37.1 C) (09/14 0740) Pulse Rate:  [86-121] 114 (09/14 0740) Resp:  [17-29] 20 (09/14 0740) BP: (94-169)/(60-92) 122/92 (09/14 0740) SpO2:  [92 %-100 %] 100 % (09/14 0740) Weight:  [81.6 kg] 81.6 kg (09/13 1447)    Intake/Output from previous day: 09/13 0701 - 09/14 0700 In: 1200 [I.V.:1000; IV Piggyback:200] Out: 100 [Urine:50; Blood:50] Intake/Output this shift: No intake/output data recorded.  Physical exam:  Alert and oriented x 3, on 2 L nasal cannula and normal work of breathing, abdomen is soft, appropriately tender to palpation with Prevena in the midline with good suction, no evidence of recurrence of hernias, Foley in place with light yellow urine, moving all extremities spontaneously  NGT with bilious output.   Lab Results: CBC  Recent Labs    09/12/24 1456 09/13/24 0549  WBC 8.2 20.0*  HGB 13.2 12.8  HCT 41.7 40.2  PLT 263 332   BMET Recent Labs    09/12/24 1456 09/13/24 0549  NA 141 142  K 3.5 4.4  CL 93* 94*  CO2 25 20*  GLUCOSE 190* 238*  BUN 44* 57*  CREATININE 7.58* 8.81*  CALCIUM 10.3 9.6   PT/INR No results for input(s): LABPROT, INR in the last 72 hours. ABG No results for input(s): PHART, HCO3 in the last 72 hours.  Invalid input(s): PCO2, PO2  Studies/Results: CT ABDOMEN PELVIS W CONTRAST Result Date: 09/12/2024 CLINICAL DATA:  vomiting, abd pain, concern for hernia EXAM: CT ABDOMEN AND PELVIS WITH CONTRAST TECHNIQUE: Multidetector CT imaging of  the abdomen and pelvis was performed using the standard protocol following bolus administration of intravenous contrast. RADIATION DOSE REDUCTION: This exam was performed according to the departmental dose-optimization program which includes automated exposure control, adjustment of the mA and/or kV according to patient size and/or use of iterative reconstruction technique. CONTRAST:  OMNIPAQUE  IOHEXOL  300 MG/ML  SOLN COMPARISON:  None Available. FINDINGS: Lower chest: No acute abnormality. Likely mixed type small hiatal hernia. Hepatobiliary: No focal liver abnormality. No gallstones, gallbladder wall thickening, or pericholecystic fluid. No biliary dilatation. Pancreas: No focal lesion. Normal pancreatic contour. No surrounding inflammatory changes. No main pancreatic ductal dilatation. Spleen: Normal in size without focal abnormality.  Splenules noted. Adrenals/Urinary Tract: No adrenal nodule bilaterally. Bilateral kidneys enhance symmetrically. No hydronephrosis. No hydroureter. The urinary bladder is unremarkable. Stomach/Bowel: Surgical changes related to a bowel resection noted within the contained hernias. Stomach is within normal limits. There is a short loop of small bowel contained within the large paraumbilical ventral wall hernia that appears to be dilated with fecalized material and fluid with a transition point noted at its exit sign site (2:61). Long loop of small bowel proximal to this hernia contained within the abdomen is dilated with fluid measuring up to 4.5 cm with air-fluid levels noted. There is a second loop of dilated small bowel containing fecalized material (4.5 cm) that is contained within the inferior-most infraumbilical hernia. There is both a proximal and distal transition point at the entry and exit site of the small  bowel from the inferior-most ventral hernia consistent with a closed loop obstruction. No pneumatosis. Large bowel is mostly decompressed. No evidence of large  bowel wall thickening or dilatation. Colonic diverticulosis. Vascular/Lymphatic: No abdominal aorta or iliac aneurysm. Mild atherosclerotic plaque of the aorta and its branches. No abdominal, pelvic, or inguinal lymphadenopathy. Reproductive: Uterus deviated to the right with peripherally calcified large fundal fibroid measuring up to 8.6 cm. Other: No intraperitoneal free fluid. No intraperitoneal free gas. No organized fluid collection. Musculoskeletal: Multiple consecutive ventral wall hernias. Starting superiorly, there is a supraumbilical ventral wall hernia containing a short segment of mid transverse colon with an abdominal defect of 5.5 cm (2:41). Smaller supraumbilical hernia inferiorly containing fat with an abdominal defect of 0.8 cm (6:90). Inferiorly there is a large paraumbilical hernia with an abdominal defect of 8.2 cm containing several loops of small small-bowel as well as a small bowel resection. The inferior-most infraumbilical abdominal hernia demonstrates an abdominal defect of 3 cm with an associated other loop of dilated short loop of small bowel. No suspicious lytic or blastic osseous lesions. No acute displaced fracture. Multilevel degenerative changes of the spine. IMPRESSION: 1. Multiple consecutive ventral wall hernias with the 2 inferior-most hernias demonstrating small-bowel obstructions (including a close loop) as described above and below. No pneumatosis. No bowel perforation. 2. Small-bowel obstruction of a long loop of small bowel with transition point at its exit site from a large paraumbilical ventral wall hernia containing multiple loops of small bowel. 3. Closed loop obstruction of a short loop of small bowel contained within the inferior-most infraumbilical wall hernia. Recommend emergent surgical consultation. 4. Couple consecutive supraumbilical ventral wall hernias containing large bowel and fat. No findings to suggest incarceration, bowel obstruction, or ischemia with  these hernias. 5. Likely mixed type small hiatal hernia. 6. Colonic diverticulosis with no acute diverticulitis. 7. Large 8.6 cm degenerative uterine fundal fibroid. These results were called by telephone at the time of interpretation on 09/12/2024 at 6:16 pm to provider GRAYDON GOODMAN , who verbally acknowledged these results. Electronically Signed   By: Morgane  Naveau M.D.   On: 09/12/2024 18:19   DG Chest 2 View Result Date: 09/12/2024 EXAM: 2 VIEW(S) XRAY OF THE CHEST 09/12/2024 03:11:00 PM COMPARISON: None available. CLINICAL HISTORY: Chest pain. Pt. Started vomiting yesterday and chest pain started today. FINDINGS: LINES, TUBES AND DEVICES: Tunneled right IJ hemodialysis catheter in place with tip extending to the proximal right atrium. LUNGS AND PLEURA: No focal pulmonary opacity. No pulmonary edema. No pleural effusion. No pneumothorax. HEART AND MEDIASTINUM: No acute abnormality of the cardiac and mediastinal silhouettes. Aortic atherosclerosis. BONES AND SOFT TISSUES: No acute osseous abnormality. IMPRESSION: 1. No acute cardiopulmonary pathology related to the clinical history of chest pain and vomiting. 2. Tunneled right IJ hemodialysis catheter in place with tip extending to the proximal right atrium. 3. Aortic atherosclerosis. Electronically signed by: Dayne Hassell MD 09/12/2024 03:56 PM EDT RP Workstation: [id number]    Anti-infectives: Anti-infectives (From admission, onward)    Start     Dose/Rate Route Frequency Ordered Stop   09/13/24 0800  ceFAZolin  (ANCEF ) IVPB 1 g/50 mL premix        1 g 100 mL/hr over 30 Minutes Intravenous Every 8 hours 09/13/24 0241 09/14/24 0759   09/12/24 2015  ceFAZolin  (ANCEF ) IVPB 2g/100 mL premix        2 g 200 mL/hr over 30 Minutes Intravenous  Once 09/12/24 1958 09/13/24 0031       Assessment/Plan:  63 year old  with history of end-stage renal disease on dialysis Monday Wednesday Friday who presents in consultation for incarcerated ventral  hernia.  This had an obstruction in it and she was the operating room on 9/13 for exploratory laparotomy with lysis of adhesions, repair of ventral hernia, omentectomy.  Overall doing okay.  She is tachycardic this morning but I think this is secondary to pain.  She has not been hypotensive.  N: Scheduled tylenol , IV narcotics for PRN pain, will add robaxin . Home atarax , zoloft  and  nad ambien .  P: Home albuterol  C: Tachycardia secondary to pain I think as well as not likely absorbing home B-Blocker over last 2 days due to vomiting, will monitor for now, home Imdur  and Coreg . PRN anti-HTN.  GI: NPO with NGT. Zofran .  RFEN: Remove foley, nephrology consult for dialysis support, continue IV fluids while NPO. Home renvela .. SSI for diabetrs ID: Will completed 24 hours of abx peri-operatively with ancef  H/H: increase in leukocytosis, will recheck tomorrow as think this is reactive, no significant anemia MSK/Ppx: okay to restart DVT ppx today. Out of bed and ambulate. Abdominal binder at all times. Prevena to hand held suction  Appreciate hospitalist and nephrologist assistance in management of complex patient   Jayson Endow, M.D. [facility]

## 2024-09-13 NOTE — Progress Notes (Signed)
 Per Dr. Marinda via secure chat, NG tube placed in OR was confirmed in stomach through visual and palpation.

## 2024-09-13 NOTE — Plan of Care (Signed)
   Problem: Coping: Goal: Level of anxiety will decrease Outcome: Progressing   Problem: Pain Managment: Goal: General experience of comfort will improve and/or be controlled Outcome: Progressing   Problem: Safety: Goal: Ability to remain free from injury will improve Outcome: Progressing

## 2024-09-13 NOTE — Progress Notes (Signed)
 PHARMACY NOTE:  ANTIMICROBIAL RENAL DOSAGE ADJUSTMENT  Current antimicrobial regimen includes a mismatch between antimicrobial dosage and estimated renal function.  As per policy approved by the Pharmacy & Therapeutics and Medical Executive Committees, the antimicrobial dosage will be adjusted accordingly.  Current antimicrobial dosage:  Cefazolin  1g IV every 8 hours  Indication: Post-op   Renal Function:  Estimated Creatinine Clearance: 5.8 mL/min (A) (by C-G formula based on SCr of 8.81 mg/dL (H)). [x]      On intermittent HD, scheduled:     Antimicrobial dosage has been changed to:  Patient gets dialysis M,W,F. Patient received Cefazolin  2g IV at 2037 on 9/13 and at 0016 on 9/14. No additional post-op abx are needed since HD isnt scheduled until Monday.    Additional comments:  Thank you for allowing pharmacy to be a part of this patient's care.  Estill CHRISTELLA Lutes, PharmD, BCPS Clinical Pharmacist 09/13/2024 1:03 PM

## 2024-09-13 NOTE — Progress Notes (Signed)
 Unable to obtain BP due to agitation and pt being combative.  3 RNs at bedside maintaining safety with patient.

## 2024-09-13 NOTE — Transfer of Care (Signed)
 Immediate Anesthesia Transfer of Care Note  Patient: Brianna [name]  Procedure(s) Performed: LAPAROTOMY, EXPLORATORY LAPAROTOMY, FOR LYSIS OF ADHESIONS OMENTECTOMY  Patient Location: PACU  Anesthesia Type:General  Level of Consciousness: sedated  Airway & Oxygen Therapy: Patient Spontanous Breathing and Patient connected to face mask oxygen  Post-op Assessment: Report given to RN and Post -op Vital signs reviewed and stable  Post vital signs: Reviewed  Last Vitals:  Vitals Value Taken Time  BP 134/83   Temp    Pulse 90 09/13/24 00:47  Resp 15 09/13/24 00:47  SpO2 98 % 09/13/24 00:47  Vitals shown include unfiled device data.  Last Pain:         Complications: No notable events documented.

## 2024-09-13 NOTE — Consult Note (Addendum)
 Initial Consultation Note   Patient: Brianna [name] FMW:[id number] DOB: 25-Jul-1961 PCP: Cecily Katz, PA-C DOA: 09/12/2024 DOS: the patient was seen and examined on 09/13/2024 Primary service: Marinda Jayson KIDD, MD  Referring physician: General Surgery Reason for consult: Tachycardia, medical management of multiple co-morbidities  HPI: [name], a 63 y.o. year old female with PMH type II DM, ESRD on hemodialysis, OSA on CPAP, chronic pain syndrome, chronic HFpEF, GERD, gout was admitted due incarcerated and obstructed ventral hernia s/p exploratory laparotomy 09/13.  Patient was found to be tachycardic in 110's, requiring 3 L  supplemental oxygen.  Hospitalist consulted for further management   Review of Systems: As mentioned in the history of present illness. All other systems reviewed and are negative. Past Medical History:  Diagnosis Date   Acid reflux    Anasarca    Anemia    Asthma    well controlled   Bilateral leg edema    Bronchopneumonia    Chronic heart failure with preserved ejection fraction (HFpEF) (HCC)    a. 12/2020 Echo: EF 70-75%, no rwma, mild LVH, GrI DD, nl RV fxn, triv MR/AI.   CKD (chronic kidney disease), stage IV (HCC)    DM (diabetes mellitus), type 2 (HCC)    Dyspnea    Hypertension    Hypokalemia    Morbid obesity (HCC)    Strangulated ventral hernia    Past Surgical History:  Procedure Laterality Date   A/V FISTULAGRAM Left 01/28/2024   Procedure: A/V Fistulagram;  Surgeon: Jama Cordella MATSU, MD;  Location: [facility];  Service: Cardiovascular;  Laterality: Left;   A/V FISTULAGRAM Left 04/14/2024   Procedure: A/V Fistulagram;  Surgeon: Jama Cordella MATSU, MD;  Location: [facility];  Service: Cardiovascular;  Laterality: Left;   AV FISTULA PLACEMENT Left 08/08/2023   Procedure: ARTERIOVENOUS (AV) FISTULA CREATION (RADIALCEPHALIC);  Surgeon: Marea Selinda RAMAN, MD;  Location: [facility];  Service: Vascular;  Laterality:  Left;  regional with MAC   BOWEL RESECTION  02/09/2020   Procedure: SMALL BOWEL RESECTION;  Surgeon: Desiderio Schanz, MD;  Location: [facility];  Service: General;;   DIALYSIS/PERMA CATHETER INSERTION N/A 05/28/2023   Procedure: DIALYSIS/PERMA CATHETER INSERTION;  Surgeon: Jama Cordella MATSU, MD;  Location: [facility];  Service: Cardiovascular;  Laterality: N/A;   LAPAROTOMY N/A 02/09/2020   Procedure: EXPLORATORY LAPAROTOMY;  Surgeon: Desiderio Schanz, MD;  Location: [facility];  Service: General;  Laterality: N/A;   Social History:  reports that she has never smoked. She has never used smokeless tobacco. She reports that she does not drink alcohol and does not use drugs.  Allergies  Allergen Reactions   Ace Inhibitors Anaphylaxis   Lisinopril Anaphylaxis   Lipitor [Atorvastatin] Other (See Comments)    Muscle aches    Family History  Problem Relation Age of Onset   Breast cancer Neg Hx     Prior to Admission medications   Medication Sig Start Date End Date Taking? Authorizing Provider  albuterol  (PROVENTIL ) (2.5 MG/3ML) 0.083% nebulizer solution Take 2.5 mg by nebulization every 4 (four) hours as needed for shortness of breath or wheezing. 12/21/19  Yes [provider]  albuterol  (VENTOLIN  HFA) 108 (90 Base) MCG/ACT inhaler Inhale 1-2 puffs into the lungs every 6 (six) hours as needed for wheezing or shortness of breath.   Yes [provider]  allopurinol  (ZYLOPRIM ) 300 MG tablet Take 300 mg by mouth every morning. 12/21/19  Yes [provider]  amLODipine  (  NORVASC ) 5 MG tablet Take 5 mg by mouth daily. 02/11/24  Yes [provider]  ASPIRIN  ADULT LOW STRENGTH 81 MG EC tablet Take 81 mg by mouth daily. 12/21/19  Yes [provider]  carvedilol  (COREG ) 12.5 MG tablet Take 1 tablet (12.5 mg total) by mouth 2 (two) times daily before a meal. 05/31/23  Yes Laurita Pillion, MD  cetirizine (ZYRTEC) 10 MG tablet Take 10 mg by mouth every morning. 09/18/19   Yes [provider]  cyanocobalamin  (VITAMIN B12) 1000 MCG tablet Take 1 tablet (1,000 mcg total) by mouth daily. 05/31/23  Yes Laurita Pillion, MD  FEROSUL 325 (65 Fe) MG tablet Take 325 mg by mouth every other day. 05/22/23  Yes [provider]  furosemide  (LASIX ) 80 MG tablet Take 1 tablet (80 mg total) by mouth daily. For leg swelling or weight gain >5lbs Patient taking differently: Take 80 mg by mouth every morning. For leg swelling or weight gain >5lbs 01/19/21  Yes Sebastian Toribio GAILS, MD  HYDROcodone -acetaminophen  (NORCO/VICODIN) 5-325 MG tablet Take 1 tablet by mouth 2 (two) times daily as needed for severe pain (pain score 7-10). Must last 30 days 08/20/24 09/19/24 Yes Patel, Seema K, NP  lactulose  (CHRONULAC ) 10 GM/15ML solution Take 10 g by mouth daily as needed for mild constipation. 03/09/24  Yes [provider]  omeprazole (PRILOSEC) 20 MG capsule Take 20 mg by mouth every morning. 12/21/19  Yes [provider]  OZEMPIC, 1 MG/DOSE, 4 MG/3ML SOPN Inject 1 mg into the skin once a week. 08/28/24  Yes [provider]  sertraline  (ZOLOFT ) 25 MG tablet Take 25 mg by mouth daily. 01/23/24  Yes [provider]  triamcinolone ointment (KENALOG) 0.5 % Apply 1 Application topically 2 (two) times daily as needed. 07/01/23  Yes [provider]  Vitamin D , Ergocalciferol , (DRISDOL) 1.25 MG (50000 UNIT) CAPS capsule Take 50,000 Units by mouth once a week. 11/20/23  Yes [provider]  ZETIA  10 MG tablet Take 10 mg by mouth every morning. 06/20/23  Yes [provider]  zolpidem  (AMBIEN ) 5 MG tablet Take 1 tablet (5 mg total) by mouth at bedtime as needed for sleep. 01/19/21  Yes Sebastian Toribio GAILS, MD  cetirizine-pseudoephedrine (ZYRTEC-D) 5-120 MG tablet Take 1 tablet by mouth 2 (two) times daily. Patient not taking: Reported on 09/13/2024 11/12/23   [provider]  colchicine 0.6 MG tablet Take by mouth. Patient not taking:  Reported on 09/13/2024 01/16/24   [provider]  hydrALAZINE  (APRESOLINE ) 50 MG tablet Take 1 tablet (50 mg total) by mouth every 8 (eight) hours. Patient not taking: Reported on 09/13/2024 05/31/23   Laurita Pillion, MD  hydrOXYzine  (ATARAX ) 50 MG tablet Take 50 mg by mouth daily. Patient not taking: Reported on 09/13/2024 02/11/24   [provider]  isosorbide  mononitrate (IMDUR ) 30 MG 24 hr tablet Take 1 tablet (30 mg total) by mouth 2 (two) times daily. Patient not taking: Reported on 09/13/2024 05/31/23   Laurita Pillion, MD  JANUVIA 100 MG tablet Take 100 mg by mouth every morning. Patient not taking: Reported on 09/13/2024 01/01/23   [provider]  sevelamer  carbonate (RENVELA ) 800 MG tablet Take 1,600 mg by mouth 3 (three) times daily. Patient not taking: Reported on 09/13/2024 03/13/24   [provider]    Physical Exam: Vitals:   09/13/24 0201 09/13/24 0240 09/13/24 0740 09/13/24 0946  BP: 126/83 120/75 (!) 122/92 121/74  Pulse: (!) 110 100 (!) 114 (!) 110  Resp: (!) 28 20 20  (!) 24  Temp: 97.8 F (36.6 C) 98.3 F (36.8 C) 98.7 F (37.1 C) 98.9 F (37.2 C)  TempSrc:  Oral  Axillary  SpO2: 97% 97% 100% 97%  Weight:      Height:        General: Mild distress due to pain, on 3 L [place] Cardio: Tachycardic, regular, no murmurs heard Lungs: clear to auscultation bilaterally ABD: Soft, tenderness to palpation expected postop Neuro: No gross focal deficits  Data Reviewed:   Labs and imaging reviewed  Assessment and Plan:  Incarcerated and obstructed ventral hernia s/p exploratory laparotomy 09/13 - Underwent exploratory laparotomy with lysis of adhesions, repair of ventral hernia, omentectomy - NPO, IV fluids - NGT - Pain management (Tylenol  q6, Dilaudid  0.5 every 2 hours prn, Robaxin  prn), antiemetics as needed - General Surgery following  Tachycardia - HR persistently in 110's, suspect due to persistent pain - reports not taking BB at home  since months - Troponin mildly elevated likely due to demand ischemia - EKG pending, TSH - If patient remains tachycardic and hypoxic with adequate pain control, will rule out PE - Pain management as above - Continue telemetry monitoring  Acute hypoxic respiratory failure - Requiring 3 L [place] - Suspect secondary to pain meds and postop - Incentive spirometer  Leukocytosis - likely reactive - WBC 20 likely reactive - CXR negative for pneumonia - On peri-op Ancef  - Monitor WBC  ESRD on hemodialysis - On MWF schedule - On Lasix  as needed at home - Nephrology consulted - Sevelamer   Chronic HFpEF - On Lasix  as needed - Gentle IV fluids as NPO  Type II DM - HbA1c pending - Home regimen: Ozempic - SSI every 4 hrs, monitor blood glucose and titrate as needed  HTN - Not taking amlodipine , Coreg , hydralazine , Imdur  as per patient  GERD - On PPI  Gout - Home allopurinol   Obesity - BMI 38.9 - Lifestyle and dietary modifications advised  Incidental findings: - Colonic diverticulosis - Large 8.6 cm uterine fundal fibroid   On Heparin  SQ for DVT ppx TRH will continue to follow the patient.   Primary team communication: discussed with General Surgery Thank you very much for involving us  in the care of your patient.  Author: Laree Lock, MD 09/13/2024 1:28 PM  For on call review [email or web address].

## 2024-09-14 ENCOUNTER — Inpatient Hospital Stay

## 2024-09-14 ENCOUNTER — Encounter: Payer: Self-pay | Admitting: General Surgery

## 2024-09-14 DIAGNOSIS — K436 Other and unspecified ventral hernia with obstruction, without gangrene: Secondary | ICD-10-CM | POA: Diagnosis not present

## 2024-09-14 LAB — CBC WITH DIFFERENTIAL/PLATELET
Abs Immature Granulocytes: 0.02 K/uL (ref 0.00–0.07)
Basophils Absolute: 0 K/uL (ref 0.0–0.1)
Basophils Relative: 0 %
Eosinophils Absolute: 0 K/uL (ref 0.0–0.5)
Eosinophils Relative: 0 %
HCT: 37.7 % (ref 36.0–46.0)
Hemoglobin: 11.9 g/dL — ABNORMAL LOW (ref 12.0–15.0)
Immature Granulocytes: 0 %
Lymphocytes Relative: 7 %
Lymphs Abs: 0.9 K/uL (ref 0.7–4.0)
MCH: 29.6 pg (ref 26.0–34.0)
MCHC: 31.6 g/dL (ref 30.0–36.0)
MCV: 93.8 fL (ref 80.0–100.0)
Monocytes Absolute: 1.8 K/uL — ABNORMAL HIGH (ref 0.1–1.0)
Monocytes Relative: 14 %
Neutro Abs: 10.2 K/uL — ABNORMAL HIGH (ref 1.7–7.7)
Neutrophils Relative %: 79 %
Platelets: 252 K/uL (ref 150–400)
RBC: 4.02 MIL/uL (ref 3.87–5.11)
RDW: 15.4 % (ref 11.5–15.5)
Smear Review: NORMAL
WBC: 12.9 K/uL — ABNORMAL HIGH (ref 4.0–10.5)
nRBC: 0 % (ref 0.0–0.2)

## 2024-09-14 LAB — GLUCOSE, CAPILLARY
Glucose-Capillary: 170 mg/dL — ABNORMAL HIGH (ref 70–99)
Glucose-Capillary: 166 mg/dL — ABNORMAL HIGH (ref 70–99)
Glucose-Capillary: 179 mg/dL — ABNORMAL HIGH (ref 70–99)
Glucose-Capillary: 185 mg/dL — ABNORMAL HIGH (ref 70–99)
Glucose-Capillary: 216 mg/dL — ABNORMAL HIGH (ref 70–99)

## 2024-09-14 LAB — BASIC METABOLIC PANEL WITH GFR
Anion gap: 22 — ABNORMAL HIGH (ref 5–15)
Anion gap: 27 — ABNORMAL HIGH (ref 5–15)
BUN: 73 mg/dL — ABNORMAL HIGH (ref 8–23)
BUN: 87 mg/dL — ABNORMAL HIGH (ref 8–23)
CO2: 22 mmol/L (ref 22–32)
CO2: 17 mmol/L — ABNORMAL LOW (ref 22–32)
Calcium: 9.9 mg/dL (ref 8.9–10.3)
Calcium: 10.2 mg/dL (ref 8.9–10.3)
Chloride: 99 mmol/L (ref 98–111)
Chloride: 96 mmol/L — ABNORMAL LOW (ref 98–111)
Creatinine, Ser: 10.45 mg/dL — ABNORMAL HIGH (ref 0.44–1.00)
Creatinine, Ser: 9.65 mg/dL — ABNORMAL HIGH (ref 0.44–1.00)
GFR, Estimated: 4 mL/min — ABNORMAL LOW (ref >60)
GFR, Estimated: 4 mL/min — ABNORMAL LOW (ref >60)
Glucose, Bld: 171 mg/dL — ABNORMAL HIGH (ref 70–99)
Glucose, Bld: 202 mg/dL — ABNORMAL HIGH (ref 70–99)
Potassium: 5 mmol/L (ref 3.5–5.1)
Potassium: 4.6 mmol/L (ref 3.5–5.1)
Sodium: 143 mmol/L (ref 135–145)
Sodium: 140 mmol/L (ref 135–145)

## 2024-09-14 LAB — HEPATITIS B SURFACE ANTIGEN: Hepatitis B Surface Ag: NONREACTIVE

## 2024-09-14 LAB — LACTIC ACID, PLASMA: Lactic Acid, Venous: 3.1 mmol/L (ref 0.5–1.9)

## 2024-09-14 MED ORDER — METHOCARBAMOL 1000 MG/10ML IJ SOLN
500.0000 mg | Freq: Three times a day (TID) | INTRAMUSCULAR | Status: DC | PRN
  Filled 2024-09-14: qty 5

## 2024-09-14 MED ORDER — SODIUM CHLORIDE 0.9 % IV BOLUS
250.0000 mL | Freq: Once | INTRAVENOUS | Status: AC
  Administered 2024-09-15: 250 mL via INTRAVENOUS

## 2024-09-14 MED ORDER — HEPARIN SODIUM (PORCINE) 1000 UNIT/ML DIALYSIS: 25.0000 [IU]/kg | INTRAMUSCULAR | Status: DC | PRN

## 2024-09-14 MED ORDER — PENTAFLUOROPROP-TETRAFLUOROETH EX AERO: 1.0000 | INHALATION_SPRAY | CUTANEOUS | Status: DC | PRN

## 2024-09-14 MED ORDER — IOHEXOL 350 MG/ML SOLN
75.0000 mL | Freq: Once | INTRAVENOUS | Status: AC | PRN
  Administered 2024-09-15: 75 mL via INTRAVENOUS

## 2024-09-14 MED ORDER — ONDANSETRON 4 MG PO TBDP
4.0000 mg | ORAL_TABLET | Freq: Three times a day (TID) | ORAL | Status: DC | PRN
  Administered 2024-09-14: 4 mg via ORAL
  Filled 2024-09-14 (×2): qty 1

## 2024-09-14 MED ORDER — ALPRAZOLAM 0.25 MG PO TABS
0.2500 mg | ORAL_TABLET | Freq: Two times a day (BID) | ORAL | Status: DC | PRN
  Administered 2024-09-14: 0.25 mg via ORAL
  Filled 2024-09-14: qty 1

## 2024-09-14 MED ORDER — ALLOPURINOL 100 MG PO TABS
100.0000 mg | ORAL_TABLET | Freq: Every day | ORAL | Status: DC
  Administered 2024-09-14: 100 mg via ORAL
  Filled 2024-09-14 (×2): qty 1

## 2024-09-14 MED ORDER — HEPARIN SODIUM (PORCINE) 1000 UNIT/ML DIALYSIS: 1000.0000 [IU] | INTRAMUSCULAR | Status: DC | PRN

## 2024-09-14 MED ORDER — LIDOCAINE-PRILOCAINE 2.5-2.5 % EX CREA: 1.0000 | TOPICAL_CREAM | CUTANEOUS | Status: DC | PRN

## 2024-09-14 NOTE — Progress Notes (Signed)
 Notified Charge nurse, surgery and internal med of pt's second RED mews score. Will continue to monitor pt closely.

## 2024-09-14 NOTE — TOC CM/SW Note (Signed)
 Transition of Care [facility]) - Inpatient Brief Assessment   Patient Details  Name: Brianna [name] MRN: [id number] Date of Birth: 31-Jul-1961  Transition of Care [facility]) CM/SW Contact:    Corean ONEIDA Haddock, RN Phone Number: 09/14/2024, 12:01 PM   Clinical Narrative:    Transition of Care [facility]) Screening Note   Patient Details  Name: Brianna [name] Date of Birth: 1961-02-15   Transition of Care [facility]) CM/SW Contact:    Corean ONEIDA Haddock, RN Phone Number: 09/14/2024, 12:01 PM    Transition of Care Department [facility]) has reviewed patient and no TOC needs have been identified at this time. . If new patient transition needs arise, please place a TOC consult.   Transition of Care Asessment: Insurance and Status: Insurance coverage has been reviewed Patient has primary care physician: Yes     Prior/Current Home Services: No current home services Social Drivers of Health Review: SDOH reviewed no interventions necessary Readmission risk has been reviewed: Yes Transition of care needs: no transition of care needs at this time

## 2024-09-14 NOTE — Progress Notes (Signed)
 Pt just called out saying she can't breathe but Spo2 is 96% on 2L [name]. Gave pt a breathing treatment and pt tolerated it well. Says she feels a little better. Notified surgery, IM and charge nurse. Will continue to monitor pt closely.

## 2024-09-14 NOTE — Progress Notes (Signed)
 [facility] SURGICAL PROGRESS NOTE  Hospital Day(s): 1.   Post op day(s): 2 Days Post-Op.   Interval History:  Patient seen and examined No acute events or new complaints overnight.  Patient up sitting in chair She reports abdominal soreness No fever, chills, nausea, emesis She had pulled NGT She does report passing flatus; says passing too much Leukocytosis is improving; WBC 12.9K Hgb to 11.9 BMP consistent with ESRD She does have Prevena; 45 ccs out; serosanguinous   Vital signs in last 24 hours: [min-max] current  Temp:  [98.1 F (36.7 C)-98.9 F (37.2 C)] 98.3 F (36.8 C) (09/15 0754) Pulse Rate:  [106-110] 106 (09/15 0754) Resp:  [18-25] 25 (09/15 0754) BP: (120-145)/(51-99) 138/99 (09/15 0754) SpO2:  [97 %-98 %] 97 % (09/15 0754)     Height: 4' 9 (144.8 cm) Weight: 81.6 kg BMI (Calculated): 38.94   Intake/Output last 2 shifts:  09/14 0701 - 09/15 0700 In: 1088.8 [I.V.:838.8; IV Piggyback:250] Out: 470 [Urine:75; Emesis/NG output:350; Drains:45]   Physical Exam:  Constitutional: alert, cooperative and no distress; sitting in chair  Respiratory: breathing non-labored at rest; on [place] Cardiovascular: regular rate and sinus rhythm  Gastrointestinal: soft, non-tender, and non-distended, no rebound/guarding  Integumentary: Laparotomy with Prevena in place; output serosanguinous  Labs:     Latest Ref Rng & Units 09/14/2024    4:29 AM 09/13/2024    5:49 AM 09/12/2024    2:56 PM  CBC  WBC 4.0 - 10.5 K/uL 12.9  20.0  8.2   Hemoglobin 12.0 - 15.0 g/dL 88.0  87.1  86.7   Hematocrit 36.0 - 46.0 % 37.7  40.2  41.7   Platelets 150 - 400 K/uL 252  332  263       Latest Ref Rng & Units 09/14/2024    4:29 AM 09/13/2024    5:49 AM 09/12/2024    2:56 PM  CMP  Glucose 70 - 99 mg/dL 828  761  809   BUN 8 - 23 mg/dL 73  57  44   Creatinine 0.44 - 1.00 mg/dL 89.54  1.18  2.41   Sodium 135 - 145 mmol/L 143  142  141   Potassium 3.5 - 5.1 mmol/L 5.0  4.4  3.5    Chloride 98 - 111 mmol/L 99  94  93   CO2 22 - 32 mmol/L 22  20  25    Calcium 8.9 - 10.3 mg/dL 9.9  9.6  89.6   Total Protein 6.5 - 8.1 g/dL   8.7   Total Bilirubin 0.0 - 1.2 mg/dL   0.8   Alkaline Phos 38 - 126 U/L   114   AST 15 - 41 U/L   23   ALT 0 - 44 U/L   15      Imaging studies: No new pertinent imaging studies   Assessment/Plan: 63 y.o. female 2 Days Post-Op s/p exploratory laparotomy, reduction of incarcerated ventral hernia, and placement of Prevena wound vac complicated by pertinent comorbidities including ESRD.   - Given reported return of bowel function, we will trial CLD with caution; Would not advance further, would not be surprising if developed ileus  - Continue Prevena; typically ~7 days; monitor output - I do think her beeping is secondary to clogged in canister, will make efforts to replace - Monitor abdominal examination; on-going bowel function - Monitor leukocytosis; improving; likely reactive - Pain control prn; avoid nephrotoxic agents  - Antiemetics prn  - Okay to mobilize; continue abdominal  binder   - Appreciate nephrology assistance with ESRD   - Appreciate medicine assistance    All of the above findings and recommendations were discussed with the patient, and the medical team, and all of patient's questions were answered to her expressed satisfaction.  -- [name], PA-C [facility] 09/14/2024, 8:02 AM M-F: 7am - 4pm

## 2024-09-14 NOTE — Progress Notes (Signed)
 Night nurse advised me that pt had just intentionally removed her on NG tube b/c she was tired of having it in and couldn't breathe well with it in. Night nurse said she notified surgery.

## 2024-09-14 NOTE — TOC Progression Note (Signed)
 Transition of Care [facility]) - Progression Note    Patient Details  Name: Brianna [name] MRN: [id number] Date of Birth: 04/25/1961  Transition of Care [facility]) CM/SW Contact  Dalia GORMAN Fuse, RN Phone Number: 09/14/2024, 4:28 PM  Clinical Narrative:     TOC received messaged from [facility] CMA [place], advising the patients request to speak with TOC. TOC placed a call to the patient in her room. The patient advised she doesn't have assistance at home and would like to see if she could have a home health aide to assist when she is discharged. TOC shared patient's request with the MD via secure chat.                    Expected Discharge Plan and Services                                               Social Drivers of Health (SDOH) Interventions SDOH Screenings   Food Insecurity: No Food Insecurity (09/13/2024)  Housing: Unknown (09/13/2024)  Transportation Needs: Unknown (09/13/2024)  Utilities: Not At Risk (09/13/2024)  Depression (PHQ2-9): Low Risk  (09/08/2024)  Financial Resource Strain: High Risk (06/04/2024)   Received from [facility]  Tobacco Use: Low Risk  (09/12/2024)    Readmission Risk Interventions     No data to display

## 2024-09-14 NOTE — Progress Notes (Signed)
 Patient treatment initiated. Vs stable upon initiation. Patient received 45 minutes of her scheduled HD treatment. Patient complained of being hot. Patient sweating profusely. Blood glucose checked and resulted 179. Blood pressure is 118/79. HR 108. Patient provided education regarding early termination and the need for hemodialysis. Patient verbalized understanding, but decided that she does not want to complete her dialysis treatment. NP made aware.

## 2024-09-14 NOTE — Progress Notes (Signed)
 Progress Note    Brianna [name]  FMW:[id number] DOB: [birth year]  DOA: 09/12/2024 PCP: Cecily Katz, PA-C      Brief Narrative:    Medical records reviewed and are as summarized below:  Brianna [name] is a 63 y.o. female with PMH type II DM, ESRD on hemodialysis, OSA on CPAP, chronic pain syndrome, chronic HFpEF, GERD, gout, who was admitted to the hospital for incarcerated abdominal hernia s/p exploratory laparotomy on 09/13/2024.  The hospitalist team was consulted because of tachycardia.       Assessment/Plan:   Principal Problem:   Incarcerated ventral hernia Active Problems:   Hernia of abdominal wall    Body mass index is 38.95 kg/m.-Class II obesity)    Incarcerated abdominal hernia: S/p exploratory laparotomy on 09/13/2024.  Follow-up with general surgeon. Leukocytosis, improving   Sinus tachycardia: Patient is still tachycardic but asymptomatic.  Elevated heart rate is probably related to pain or recent surgery.  She is not hypoxic and was successfully weaned off of oxygen this morning.   ESRD: Plan for hemodialysis today.   Chronic HFpEF: Compensated   Gout: Stable.  Decrease allopurinol  from 300 mg to 100 mg daily (renal dose)  Diet Order             Diet clear liquid Fluid consistency: Thin  Diet effective now                                  Consultants: General Surgeon  Procedures: Ex lap on 09/13/2024    Medications:    allopurinol   100 mg Oral Daily   Chlorhexidine  Gluconate Cloth  6 each Topical Daily   ezetimibe   10 mg Oral BH-q7a   heparin   5,000 Units Subcutaneous Q8H   hydrOXYzine   50 mg Oral Daily   insulin  aspart  0-9 Units Subcutaneous Q4H   methocarbamol  (ROBAXIN ) injection  500 mg Intravenous Q8H   pantoprazole  (PROTONIX ) IV  40 mg Intravenous Q24H   sertraline   25 mg Oral Daily   sevelamer  carbonate  1,600 mg Oral TID with meals   Continuous Infusions:  sodium chloride  75  mL/hr at 09/14/24 0440     Anti-infectives (From admission, onward)    Start     Dose/Rate Route Frequency Ordered Stop   09/13/24 0800  ceFAZolin  (ANCEF ) IVPB 1 g/50 mL premix  Status:  Discontinued        1 g 100 mL/hr over 30 Minutes Intravenous Every 8 hours 09/13/24 0241 09/13/24 1301   09/12/24 2015  ceFAZolin  (ANCEF ) IVPB 2g/100 mL premix        2 g 200 mL/hr over 30 Minutes Intravenous  Once 09/12/24 1958 09/13/24 0031              Family Communication/Anticipated D/C date and plan/Code Status   DVT prophylaxis: heparin  injection 5,000 Units Start: 09/13/24 0945 SCDs Start: 09/13/24 0859 SCDs Start: 09/13/24 0242     Code Status: Full Code  Family Communication: None Disposition Plan: Plan to discharge home         Subjective:   Interval events noted.  No chest pain, palpitations or shortness of breath.  Abdominal pain is better.  Joy, RN, was at the bedside.  Objective:    Vitals:   09/13/24 2043 09/14/24 0519 09/14/24 0754 09/14/24 0900  BP: (!) 145/78 (!) 134/51 (!) 138/99   Pulse:  (!) 110 (!) 106  Resp: 18 20 (!) 25   Temp: 98.3 F (36.8 C) 98.9 F (37.2 C) 98.3 F (36.8 C)   TempSrc:  Oral Oral   SpO2: 98% 98% 97% 100%  Weight:      Height:       No data found.   Intake/Output Summary (Last 24 hours) at 09/14/2024 1040 Last data filed at 09/14/2024 0900 Gross per 24 hour  Intake 1568.77 ml  Output 470 ml  Net 1098.77 ml   Filed Weights   09/12/24 1447  Weight: 81.6 kg    Exam:  GEN: NAD SKIN: Warm and dry EYES: No pallor icterus ENT: MMM CV: RRR PULM: CTA B ABD: soft, obese, NT, +BS, + wound VAC to surgical incisional wound CNS: AAO x 3, non focal EXT: No edema or tenderness        Data Reviewed:   I have personally reviewed following labs and imaging studies:  Labs: Labs show the following:   Basic Metabolic Panel: Recent Labs  Lab 09/12/24 1456 09/13/24 0549 09/14/24 0429  NA 141 142 143  K  3.5 4.4 5.0  CL 93* 94* 99  CO2 25 20* 22  GLUCOSE 190* 238* 171*  BUN 44* 57* 73*  CREATININE 7.58* 8.81* 10.45*  CALCIUM 10.3 9.6 9.9   GFR Estimated Creatinine Clearance: 4.9 mL/min (A) (by C-G formula based on SCr of 10.45 mg/dL (H)). Liver Function Tests: Recent Labs  Lab 09/12/24 1456  AST 23  ALT 15  ALKPHOS 114  BILITOT 0.8  PROT 8.7*  ALBUMIN  4.3   Recent Labs  Lab 09/12/24 1456  LIPASE 32   No results for input(s): AMMONIA in the last 168 hours. Coagulation profile No results for input(s): INR, PROTIME in the last 168 hours.  CBC: Recent Labs  Lab 09/12/24 1456 09/13/24 0549 09/14/24 0429  WBC 8.2 20.0* 12.9*  NEUTROABS  --   --  10.2*  HGB 13.2 12.8 11.9*  HCT 41.7 40.2 37.7  MCV 94.1 93.9 93.8  PLT 263 332 252   Cardiac Enzymes: No results for input(s): CKTOTAL, CKMB, CKMBINDEX, TROPONINI in the last 168 hours. BNP (last 3 results) No results for input(s): PROBNP in the last 8760 hours. CBG: Recent Labs  Lab 09/13/24 1642 09/13/24 2006 09/13/24 2329 09/14/24 0423 09/14/24 0755  GLUCAP 218* 187* 170* 170* 166*   D-Dimer: No results for input(s): DDIMER in the last 72 hours. Hgb A1c: Recent Labs    09/13/24 0549  HGBA1C 5.6   Lipid Profile: No results for input(s): CHOL, HDL, LDLCALC, TRIG, CHOLHDL, LDLDIRECT in the last 72 hours. Thyroid function studies: Recent Labs    09/13/24 0549  TSH 4.814*   Anemia work up: No results for input(s): VITAMINB12, FOLATE, FERRITIN, TIBC, IRON, RETICCTPCT in the last 72 hours. Sepsis Labs: Recent Labs  Lab 09/12/24 1456 09/13/24 0549 09/14/24 0429  WBC 8.2 20.0* 12.9*    Microbiology No results found for this or any previous visit (from the past 240 hours).  Procedures and diagnostic studies:  CT ABDOMEN PELVIS W CONTRAST Result Date: 09/12/2024 CLINICAL DATA:  vomiting, abd pain, concern for hernia EXAM: CT ABDOMEN AND PELVIS WITH CONTRAST  TECHNIQUE: Multidetector CT imaging of the abdomen and pelvis was performed using the standard protocol following bolus administration of intravenous contrast. RADIATION DOSE REDUCTION: This exam was performed according to the departmental dose-optimization program which includes automated exposure control, adjustment of the mA and/or kV according to patient size and/or use of iterative reconstruction technique.  CONTRAST:  100mL OMNIPAQUE  IOHEXOL  300 MG/ML  SOLN COMPARISON:  None Available. FINDINGS: Lower chest: No acute abnormality. Likely mixed type small hiatal hernia. Hepatobiliary: No focal liver abnormality. No gallstones, gallbladder wall thickening, or pericholecystic fluid. No biliary dilatation. Pancreas: No focal lesion. Normal pancreatic contour. No surrounding inflammatory changes. No main pancreatic ductal dilatation. Spleen: Normal in size without focal abnormality.  Splenules noted. Adrenals/Urinary Tract: No adrenal nodule bilaterally. Bilateral kidneys enhance symmetrically. No hydronephrosis. No hydroureter. The urinary bladder is unremarkable. Stomach/Bowel: Surgical changes related to a bowel resection noted within the contained hernias. Stomach is within normal limits. There is a short loop of small bowel contained within the large paraumbilical ventral wall hernia that appears to be dilated with fecalized material and fluid with a transition point noted at its exit sign site (2:61). Long loop of small bowel proximal to this hernia contained within the abdomen is dilated with fluid measuring up to 4.5 cm with air-fluid levels noted. There is a second loop of dilated small bowel containing fecalized material (4.5 cm) that is contained within the inferior-most infraumbilical hernia. There is both a proximal and distal transition point at the entry and exit site of the small bowel from the inferior-most ventral hernia consistent with a closed loop obstruction. No pneumatosis. Large bowel is  mostly decompressed. No evidence of large bowel wall thickening or dilatation. Colonic diverticulosis. Vascular/Lymphatic: No abdominal aorta or iliac aneurysm. Mild atherosclerotic plaque of the aorta and its branches. No abdominal, pelvic, or inguinal lymphadenopathy. Reproductive: Uterus deviated to the right with peripherally calcified large fundal fibroid measuring up to 8.6 cm. Other: No intraperitoneal free fluid. No intraperitoneal free gas. No organized fluid collection. Musculoskeletal: Multiple consecutive ventral wall hernias. Starting superiorly, there is a supraumbilical ventral wall hernia containing a short segment of mid transverse colon with an abdominal defect of 5.5 cm (2:41). Smaller supraumbilical hernia inferiorly containing fat with an abdominal defect of 0.8 cm (6:90). Inferiorly there is a large paraumbilical hernia with an abdominal defect of 8.2 cm containing several loops of small small-bowel as well as a small bowel resection. The inferior-most infraumbilical abdominal hernia demonstrates an abdominal defect of 3 cm with an associated other loop of dilated short loop of small bowel. No suspicious lytic or blastic osseous lesions. No acute displaced fracture. Multilevel degenerative changes of the spine. IMPRESSION: 1. Multiple consecutive ventral wall hernias with the 2 inferior-most hernias demonstrating small-bowel obstructions (including a close loop) as described above and below. No pneumatosis. No bowel perforation. 2. Small-bowel obstruction of a long loop of small bowel with transition point at its exit site from a large paraumbilical ventral wall hernia containing multiple loops of small bowel. 3. Closed loop obstruction of a short loop of small bowel contained within the inferior-most infraumbilical wall hernia. Recommend emergent surgical consultation. 4. Couple consecutive supraumbilical ventral wall hernias containing large bowel and fat. No findings to suggest  incarceration, bowel obstruction, or ischemia with these hernias. 5. Likely mixed type small hiatal hernia. 6. Colonic diverticulosis with no acute diverticulitis. 7. Large 8.6 cm degenerative uterine fundal fibroid. These results were called by telephone at the time of interpretation on 09/12/2024 at 6:16 pm to provider GRAYDON GOODMAN , who verbally acknowledged these results. Electronically Signed   By: Morgane  Naveau M.D.   On: 09/12/2024 18:19   DG Chest 2 View Result Date: 09/12/2024 EXAM: 2 VIEW(S) XRAY OF THE CHEST 09/12/2024 03:11:00 PM COMPARISON: None available. CLINICAL HISTORY: Chest pain. Pt. Started vomiting  yesterday and chest pain started today. FINDINGS: LINES, TUBES AND DEVICES: Tunneled right IJ hemodialysis catheter in place with tip extending to the proximal right atrium. LUNGS AND PLEURA: No focal pulmonary opacity. No pulmonary edema. No pleural effusion. No pneumothorax. HEART AND MEDIASTINUM: No acute abnormality of the cardiac and mediastinal silhouettes. Aortic atherosclerosis. BONES AND SOFT TISSUES: No acute osseous abnormality. IMPRESSION: 1. No acute cardiopulmonary pathology related to the clinical history of chest pain and vomiting. 2. Tunneled right IJ hemodialysis catheter in place with tip extending to the proximal right atrium. 3. Aortic atherosclerosis. Electronically signed by: Dayne Hassell MD 09/12/2024 03:56 PM EDT RP Workstation: [id number]               LOS: 1 day   Jesse Nosbisch  [facility]   Pager on [email or web address]. If 7PM-7AM, please contact night-coverage at [email or web address]     09/14/2024, 10:40 AM

## 2024-09-14 NOTE — Progress Notes (Signed)
 [facility]  ROUNDING NOTE   Subjective:   Brianna [name] is a 63 year old female with past medical conditions including obstructive sleep apnea on CPAP, gout, GERD, type 2 diabetes, and end-stage renal disease on hemodialysis.  Patient presented to the hospital due to incarcerated and obstructed ventral hernia, experienced exploratory laparotomy on 9/13.  Patient is currently admitted for SBO (small bowel obstruction) (HCC) [K56.609] Hernia of abdominal wall [K43.9] Incarcerated ventral hernia [K43.6]  Postoperatively patient found to be tachycardic and requiring supplemental oxygen, 3 L.  Patient is known to our practice and receives outpatient dialysis treatments at [facility] on a MWF schedule, supervised by Dr. Marcelino.  Last treatment was completed on Friday.  Patient is seen and evaluated during dialysis.   HEMODIALYSIS FLOWSHEET:  Blood Flow Rate (mL/min): 200 mL/min Arterial Pressure (mmHg): -105.45 mmHg Venous Pressure (mmHg): 97.77 mmHg TMP (mmHg): 6.26 mmHg Ultrafiltration Rate (mL/min): 642 mL/min Dialysate Flow Rate (mL/min): 299 ml/min  Patient reports that her left arm, fistula site is sore and requests that we use HD PermCath with treatment.  This request was accommodated.  Patient appears anxious during treatment but tolerating well.  Labs appear acceptable, potassium 5.0 with creatinine 10.45 today.  BUN slightly elevated at 73.  Hemoglobin acceptable 11.9.  Patient currently on clear liquid diet with IV fluids infusing at 75 mL/h.  We have been consulted to manage dialysis needs.    Objective:  Vital signs in last 24 hours:  Temp:  [97.6 F (36.4 C)-98.9 F (37.2 C)] 98.4 F (36.9 C) (09/15 1500) Pulse Rate:  [106-116] 111 (09/15 1313) Resp:  [18-36] 36 (09/15 1500) BP: (106-153)/(51-99) 146/82 (09/15 1500) SpO2:  [92 %-100 %] 99 % (09/15 1500) Weight:  [83.2 kg] 83.2 kg (09/15 1204)  Weight change:  Filed Weights   09/12/24 1447  09/14/24 1100 09/14/24 1204  Weight: 81.6 kg 83.2 kg 83.2 kg    Intake/Output: I/O last 3 completed shifts: In: 2288.8 [I.V.:1838.8; IV Piggyback:450] Out: 570 [Urine:125; Emesis/NG output:350; Drains:45; Blood:50]   Intake/Output this shift:  Total I/O In: 480 [P.O.:480] Out: 0   Physical Exam: General: NAD  Head: Normocephalic, atraumatic. Moist oral mucosal membranes  Eyes: Anicteric  Neck: Supple  Lungs:  Clear to auscultation  Heart: Regular rate and rhythm  Abdomen:  Soft, tender, NPWV in place, abdominal binder  Extremities: No peripheral edema.  Neurologic: Awake, alert, conversant  Skin: Warm,dry, no rash  Access: Left upper aVF, right IJ PermCath    Basic Metabolic Panel: Recent Labs  Lab 09/12/24 1456 09/13/24 0549 09/14/24 0429  NA 141 142 143  K 3.5 4.4 5.0  CL 93* 94* 99  CO2 25 20* 22  GLUCOSE 190* 238* 171*  BUN 44* 57* 73*  CREATININE 7.58* 8.81* 10.45*  CALCIUM 10.3 9.6 9.9    Liver Function Tests: Recent Labs  Lab 09/12/24 1456  AST 23  ALT 15  ALKPHOS 114  BILITOT 0.8  PROT 8.7*  ALBUMIN  4.3   Recent Labs  Lab 09/12/24 1456  LIPASE 32   No results for input(s): AMMONIA in the last 168 hours.  CBC: Recent Labs  Lab 09/12/24 1456 09/13/24 0549 09/14/24 0429  WBC 8.2 20.0* 12.9*  NEUTROABS  --   --  10.2*  HGB 13.2 12.8 11.9*  HCT 41.7 40.2 37.7  MCV 94.1 93.9 93.8  PLT 263 332 252    Cardiac Enzymes: No results for input(s): CKTOTAL, CKMB, CKMBINDEX, TROPONINI in the last 168 hours.  BNP: Invalid input(s): POCBNP  CBG: Recent Labs  Lab 09/13/24 2006 09/13/24 2329 09/14/24 0423 09/14/24 0755 09/14/24 1145  GLUCAP 187* 170* 170* 166* 179*    Microbiology: Results for orders placed or performed during the hospital encounter of 05/23/23  Surgical PCR screen     Status: Abnormal   Collection Time: 05/27/23  9:05 PM   Specimen: Nasal Mucosa; Nasal Swab  Result Value Ref Range Status   MRSA, PCR  NEGATIVE NEGATIVE Final   Staphylococcus aureus POSITIVE (A) NEGATIVE Final    Comment: (NOTE) The Xpert SA Assay (FDA approved for NASAL specimens in patients 81 years of age and older), is one component of a comprehensive surveillance program. It is not intended to diagnose infection nor to guide or monitor treatment. Performed at [facility], [address] [place], [place] [address]     Coagulation Studies: No results for input(s): LABPROT, INR in the last 72 hours.  Urinalysis: No results for input(s): COLORURINE, LABSPEC, PHURINE, GLUCOSEU, HGBUR, BILIRUBINUR, KETONESUR, PROTEINUR, UROBILINOGEN, NITRITE, LEUKOCYTESUR in the last 72 hours.  Invalid input(s): APPERANCEUR    Imaging: CT ABDOMEN PELVIS W CONTRAST Result Date: 09/12/2024 CLINICAL DATA:  vomiting, abd pain, concern for hernia EXAM: CT ABDOMEN AND PELVIS WITH CONTRAST TECHNIQUE: Multidetector CT imaging of the abdomen and pelvis was performed using the standard protocol following bolus administration of intravenous contrast. RADIATION DOSE REDUCTION: This exam was performed according to the departmental dose-optimization program which includes automated exposure control, adjustment of the mA and/or kV according to patient size and/or use of iterative reconstruction technique. CONTRAST:  OMNIPAQUE  IOHEXOL  300 MG/ML  SOLN COMPARISON:  None Available. FINDINGS: Lower chest: No acute abnormality. Likely mixed type small hiatal hernia. Hepatobiliary: No focal liver abnormality. No gallstones, gallbladder wall thickening, or pericholecystic fluid. No biliary dilatation. Pancreas: No focal lesion. Normal pancreatic contour. No surrounding inflammatory changes. No main pancreatic ductal dilatation. Spleen: Normal in size without focal abnormality.  Splenules noted. Adrenals/Urinary Tract: No adrenal nodule bilaterally. Bilateral kidneys enhance symmetrically. No hydronephrosis. No  hydroureter. The urinary bladder is unremarkable. Stomach/Bowel: Surgical changes related to a bowel resection noted within the contained hernias. Stomach is within normal limits. There is a short loop of small bowel contained within the large paraumbilical ventral wall hernia that appears to be dilated with fecalized material and fluid with a transition point noted at its exit sign site (2:61). Long loop of small bowel proximal to this hernia contained within the abdomen is dilated with fluid measuring up to 4.5 cm with air-fluid levels noted. There is a second loop of dilated small bowel containing fecalized material (4.5 cm) that is contained within the inferior-most infraumbilical hernia. There is both a proximal and distal transition point at the entry and exit site of the small bowel from the inferior-most ventral hernia consistent with a closed loop obstruction. No pneumatosis. Large bowel is mostly decompressed. No evidence of large bowel wall thickening or dilatation. Colonic diverticulosis. Vascular/Lymphatic: No abdominal aorta or iliac aneurysm. Mild atherosclerotic plaque of the aorta and its branches. No abdominal, pelvic, or inguinal lymphadenopathy. Reproductive: Uterus deviated to the right with peripherally calcified large fundal fibroid measuring up to 8.6 cm. Other: No intraperitoneal free fluid. No intraperitoneal free gas. No organized fluid collection. Musculoskeletal: Multiple consecutive ventral wall hernias. Starting superiorly, there is a supraumbilical ventral wall hernia containing a short segment of mid transverse colon with an abdominal defect of 5.5 cm (2:41). Smaller supraumbilical hernia inferiorly containing fat with an  abdominal defect of 0.8 cm (6:90). Inferiorly there is a large paraumbilical hernia with an abdominal defect of 8.2 cm containing several loops of small small-bowel as well as a small bowel resection. The inferior-most infraumbilical abdominal hernia demonstrates an  abdominal defect of 3 cm with an associated other loop of dilated short loop of small bowel. No suspicious lytic or blastic osseous lesions. No acute displaced fracture. Multilevel degenerative changes of the spine. IMPRESSION: 1. Multiple consecutive ventral wall hernias with the 2 inferior-most hernias demonstrating small-bowel obstructions (including a close loop) as described above and below. No pneumatosis. No bowel perforation. 2. Small-bowel obstruction of a long loop of small bowel with transition point at its exit site from a large paraumbilical ventral wall hernia containing multiple loops of small bowel. 3. Closed loop obstruction of a short loop of small bowel contained within the inferior-most infraumbilical wall hernia. Recommend emergent surgical consultation. 4. Couple consecutive supraumbilical ventral wall hernias containing large bowel and fat. No findings to suggest incarceration, bowel obstruction, or ischemia with these hernias. 5. Likely mixed type small hiatal hernia. 6. Colonic diverticulosis with no acute diverticulitis. 7. Large 8.6 cm degenerative uterine fundal fibroid. These results were called by telephone at the time of interpretation on 09/12/2024 at 6:16 pm to provider GRAYDON GOODMAN , who verbally acknowledged these results. Electronically Signed   By: Morgane  Naveau M.D.   On: 09/12/2024 18:19   DG Chest 2 View Result Date: 09/12/2024 EXAM: 2 VIEW(S) XRAY OF THE CHEST 09/12/2024 03:11:00 PM COMPARISON: None available. CLINICAL HISTORY: Chest pain. Pt. Started vomiting yesterday and chest pain started today. FINDINGS: LINES, TUBES AND DEVICES: Tunneled right IJ hemodialysis catheter in place with tip extending to the proximal right atrium. LUNGS AND PLEURA: No focal pulmonary opacity. No pulmonary edema. No pleural effusion. No pneumothorax. HEART AND MEDIASTINUM: No acute abnormality of the cardiac and mediastinal silhouettes. Aortic atherosclerosis. BONES AND SOFT TISSUES: No  acute osseous abnormality. IMPRESSION: 1. No acute cardiopulmonary pathology related to the clinical history of chest pain and vomiting. 2. Tunneled right IJ hemodialysis catheter in place with tip extending to the proximal right atrium. 3. Aortic atherosclerosis. Electronically signed by: Dayne Hassell MD 09/12/2024 03:56 PM EDT RP Workstation: [id number]     Medications:     allopurinol   100 mg Oral Daily   Chlorhexidine  Gluconate Cloth  6 each Topical Daily   ezetimibe   10 mg Oral BH-q7a   heparin   5,000 Units Subcutaneous Q8H   hydrOXYzine   50 mg Oral Daily   insulin  aspart  0-9 Units Subcutaneous Q4H   pantoprazole  (PROTONIX ) IV  40 mg Intravenous Q24H   sertraline   25 mg Oral Daily   sevelamer  carbonate  1,600 mg Oral TID with meals   albuterol , hydrALAZINE , HYDROmorphone  (DILAUDID ) injection, methocarbamol  (ROBAXIN ) injection, ondansetron  (ZOFRAN ) IV, ondansetron , zolpidem   Assessment/ Plan:  Ms. Suzane Vanderweide is a 63 y.o.  female with past medical conditions including obstructive sleep apnea on CPAP, gout, GERD, type 2 diabetes, and end-stage renal disease on hemodialysis.  Patient presented to the hospital due to incarcerated and obstructed ventral hernia, experienced exploratory laparotomy on 9/13.  Patient is currently admitted for SBO (small bowel obstruction) (HCC) [K56.609] Hernia of abdominal wall [K43.9] Incarcerated ventral hernia [K43.6]  [facility]/MWF/right IJ PermCath/left upper AVF  End-stage renal disease on hemodialysis.  Last treatment received on Friday.  Patient will receive scheduled treatment today however became anxious and diaphoretic during treatment.  Despite interventions, decreased machine temperature, removing  blankets, blood pressure, heart rate and glucose normal.  Patient requested to discontinue treatment.  Agreeable to complete treatment tomorrow if needed.  Primary team notified of these events.  IV fluids stopped to prevent volume  overload.  2. Anemia of chronic kidney disease Lab Results  Component Value Date   HGB 11.9 (L) 09/14/2024    Hemoglobin acceptable for this patient.  Will continue to monitor for now.  3.  Incarcerated ventral hernia status post exploratory laparotomy on 9/13.  Wound VAC placed.  General surgery continues to follow and offer supportive measures and pain management.  4. Secondary Hyperparathyroidism: with outpatient labs: PTH 993, phosphorus 4.9, calcium 9.5 on 09/07/24.   Lab Results  Component Value Date   PTH 651 (H) 05/24/2023   CALCIUM 9.9 09/14/2024   CAION 1.11 (L) 08/08/2023   PHOS 4.6 05/31/2023    Will continue to monitor bone minerals.    LOS: 1 Donterius Filley 9/15/20253:10 PM

## 2024-09-14 NOTE — Progress Notes (Signed)
   09/14/24 2317  BiPAP/CPAP/SIPAP  $ Non-Invasive Ventilator  Non-Invasive Vent Initial;Non-Invasive Vent Set Up  $ Face Mask Medium Yes  BiPAP/CPAP/SIPAP Pt Type Adult  BiPAP/CPAP/SIPAP Resmed  Mask Type Full face mask  Mask Size Medium  Respiratory Rate 28 breaths/min  EPAP 10 cmH2O  Flow Rate 5 lpm  Patient Home Machine No  Patient Home Mask No  Patient Home Tubing No  Auto Titrate No  Device Plugged into RED Power Outlet Yes   RT called to patient bedside for CPAP placement. Patient previously nauseous but declines nausea at this time, nausea medication given at 2218, NG tube in place as well.

## 2024-09-14 NOTE — Progress Notes (Signed)
  Pt receives outpt HD at [facility] [address] on MWF. Navigator following to assist with any HD needs.  Suzen Satchel Dialysis Navigator [phone]

## 2024-09-15 ENCOUNTER — Inpatient Hospital Stay

## 2024-09-15 ENCOUNTER — Encounter: Admitting: Nurse Practitioner

## 2024-09-15 DIAGNOSIS — N186 End stage renal disease: Secondary | ICD-10-CM

## 2024-09-15 DIAGNOSIS — K436 Other and unspecified ventral hernia with obstruction, without gangrene: Secondary | ICD-10-CM | POA: Diagnosis not present

## 2024-09-15 DIAGNOSIS — R579 Shock, unspecified: Secondary | ICD-10-CM

## 2024-09-15 DIAGNOSIS — K56609 Unspecified intestinal obstruction, unspecified as to partial versus complete obstruction: Secondary | ICD-10-CM

## 2024-09-15 DIAGNOSIS — Z992 Dependence on renal dialysis: Secondary | ICD-10-CM

## 2024-09-15 LAB — PHOSPHORUS: Phosphorus: 8.2 mg/dL — ABNORMAL HIGH (ref 2.5–4.6)

## 2024-09-15 LAB — SURGICAL PATHOLOGY

## 2024-09-15 LAB — COMPREHENSIVE METABOLIC PANEL WITH GFR
ALT: <5 U/L (ref 0–44)
AST: 50 U/L — ABNORMAL HIGH (ref 15–41)
Albumin: 3.1 g/dL — ABNORMAL LOW (ref 3.5–5.0)
Alkaline Phosphatase: 106 U/L (ref 38–126)
Anion gap: 25 — ABNORMAL HIGH (ref 5–15)
BUN: 99 mg/dL — ABNORMAL HIGH (ref 8–23)
CO2: 20 mmol/L — ABNORMAL LOW (ref 22–32)
Calcium: 9.8 mg/dL (ref 8.9–10.3)
Chloride: 95 mmol/L — ABNORMAL LOW (ref 98–111)
Creatinine, Ser: 10.15 mg/dL — ABNORMAL HIGH (ref 0.44–1.00)
GFR, Estimated: 4 mL/min — ABNORMAL LOW (ref >60)
Glucose, Bld: 175 mg/dL — ABNORMAL HIGH (ref 70–99)
Potassium: 4.5 mmol/L (ref 3.5–5.1)
Sodium: 140 mmol/L (ref 135–145)
Total Bilirubin: 0.9 mg/dL (ref 0.0–1.2)
Total Protein: 7.2 g/dL (ref 6.5–8.1)

## 2024-09-15 LAB — BLOOD GAS, ARTERIAL
Acid-base deficit: 3.6 mmol/L — ABNORMAL HIGH (ref 0.0–2.0)
Bicarbonate: 20.6 mmol/L (ref 20.0–28.0)
Delivery systems: POSITIVE
O2 Saturation: 97.8 %
Patient temperature: 37
pCO2 arterial: 34 mmHg (ref 32–48)
pH, Arterial: 7.39 (ref 7.35–7.45)
pO2, Arterial: 85 mmHg (ref 83–108)

## 2024-09-15 LAB — GLUCOSE, CAPILLARY
Glucose-Capillary: 101 mg/dL — ABNORMAL HIGH (ref 70–99)
Glucose-Capillary: 118 mg/dL — ABNORMAL HIGH (ref 70–99)
Glucose-Capillary: 137 mg/dL — ABNORMAL HIGH (ref 70–99)
Glucose-Capillary: 142 mg/dL — ABNORMAL HIGH (ref 70–99)
Glucose-Capillary: 169 mg/dL — ABNORMAL HIGH (ref 70–99)
Glucose-Capillary: 171 mg/dL — ABNORMAL HIGH (ref 70–99)
Glucose-Capillary: 97 mg/dL (ref 70–99)

## 2024-09-15 LAB — CBC WITH DIFFERENTIAL/PLATELET
Abs Immature Granulocytes: 0.06 K/uL (ref 0.00–0.07)
Basophils Absolute: 0 K/uL (ref 0.0–0.1)
Basophils Relative: 0 %
Eosinophils Absolute: 0 K/uL (ref 0.0–0.5)
Eosinophils Relative: 0 %
HCT: 30.9 % — ABNORMAL LOW (ref 36.0–46.0)
Hemoglobin: 10.1 g/dL — ABNORMAL LOW (ref 12.0–15.0)
Immature Granulocytes: 1 %
Lymphocytes Relative: 7 %
Lymphs Abs: 0.9 K/uL (ref 0.7–4.0)
MCH: 30.1 pg (ref 26.0–34.0)
MCHC: 32.7 g/dL (ref 30.0–36.0)
MCV: 92.2 fL (ref 80.0–100.0)
Monocytes Absolute: 1.6 K/uL — ABNORMAL HIGH (ref 0.1–1.0)
Monocytes Relative: 14 %
Neutro Abs: 9 K/uL — ABNORMAL HIGH (ref 1.7–7.7)
Neutrophils Relative %: 78 %
Platelets: 243 K/uL (ref 150–400)
RBC: 3.35 MIL/uL — ABNORMAL LOW (ref 3.87–5.11)
RDW: 15.6 % — ABNORMAL HIGH (ref 11.5–15.5)
Smear Review: NORMAL
WBC: 11.5 K/uL — ABNORMAL HIGH (ref 4.0–10.5)
nRBC: 0 % (ref 0.0–0.2)

## 2024-09-15 LAB — MRSA NEXT GEN BY PCR, NASAL: MRSA by PCR Next Gen: NOT DETECTED

## 2024-09-15 LAB — PROTIME-INR
INR: 2.1 — ABNORMAL HIGH (ref 0.8–1.2)
Prothrombin Time: 24.2 s — ABNORMAL HIGH (ref 11.4–15.2)

## 2024-09-15 LAB — LACTIC ACID, PLASMA
Lactic Acid, Venous: 1 mmol/L (ref 0.5–1.9)
Lactic Acid, Venous: 1 mmol/L (ref 0.5–1.9)
Lactic Acid, Venous: 1.4 mmol/L (ref 0.5–1.9)
Lactic Acid, Venous: 2.3 mmol/L (ref 0.5–1.9)
Lactic Acid, Venous: 3.6 mmol/L (ref 0.5–1.9)

## 2024-09-15 LAB — APTT: aPTT: 51 s — ABNORMAL HIGH (ref 24–36)

## 2024-09-15 LAB — HEPATITIS B SURFACE ANTIBODY, QUANTITATIVE: Hep B S AB Quant (Post): 177 m[IU]/mL

## 2024-09-15 MED ORDER — VANCOMYCIN HCL 2000 MG/400ML IV SOLN
2000.0000 mg | Freq: Once | INTRAVENOUS | Status: AC
  Administered 2024-09-15: 2000 mg via INTRAVENOUS
  Filled 2024-09-15 (×2): qty 400

## 2024-09-15 MED ORDER — ALPRAZOLAM 0.25 MG PO TABS: 0.2500 mg | ORAL_TABLET | Freq: Two times a day (BID) | ORAL | Status: DC | PRN

## 2024-09-15 MED ORDER — SERTRALINE HCL 50 MG PO TABS
25.0000 mg | ORAL_TABLET | Freq: Every day | ORAL | Status: DC
  Administered 2024-09-16 – 2024-09-18 (×3): 25 mg
  Filled 2024-09-15 (×2): qty 1
  Filled 2024-09-15: qty 0.5
  Filled 2024-09-15 (×2): qty 1

## 2024-09-15 MED ORDER — LACTATED RINGERS IV BOLUS (SEPSIS)
1000.0000 mL | Freq: Once | INTRAVENOUS | Status: AC
Start: 2024-09-15 — End: 2024-09-15
  Administered 2024-09-15: 1000 mL via INTRAVENOUS

## 2024-09-15 MED ORDER — SODIUM CHLORIDE 0.9 % IV BOLUS
250.0000 mL | Freq: Once | INTRAVENOUS | Status: AC
  Administered 2024-09-15: 250 mL via INTRAVENOUS

## 2024-09-15 MED ORDER — ZOLPIDEM TARTRATE 5 MG PO TABS: 5.0000 mg | ORAL_TABLET | Freq: Every evening | ORAL | Status: DC | PRN

## 2024-09-15 MED ORDER — VANCOMYCIN HCL IN DEXTROSE 1-5 GM/200ML-% IV SOLN
1000.0000 mg | INTRAVENOUS | Status: DC
Start: 2024-09-16 — End: 2024-09-23
  Filled 2024-09-15: qty 200

## 2024-09-15 MED ORDER — SODIUM CHLORIDE 0.9 % IV SOLN
2.0000 g | Freq: Every day | INTRAVENOUS | Status: DC
  Administered 2024-09-15 – 2024-09-16 (×2): 2 g via INTRAVENOUS
  Filled 2024-09-15 (×3): qty 20

## 2024-09-15 MED ORDER — SEVELAMER CARBONATE 0.8 G PO PACK
0.8000 g | PACK | Freq: Three times a day (TID) | ORAL | Status: DC
  Filled 2024-09-15 (×4): qty 1

## 2024-09-15 MED ORDER — ALLOPURINOL 100 MG PO TABS
100.0000 mg | ORAL_TABLET | Freq: Every day | ORAL | Status: DC
  Administered 2024-09-16 – 2024-09-18 (×3): 100 mg
  Filled 2024-09-15 (×5): qty 1

## 2024-09-15 MED ORDER — EZETIMIBE 10 MG PO TABS
10.0000 mg | ORAL_TABLET | Freq: Every day | ORAL | Status: DC
  Administered 2024-09-16 – 2024-09-18 (×3): 10 mg
  Filled 2024-09-15 (×4): qty 1

## 2024-09-15 MED ORDER — SEVELAMER CARBONATE 800 MG PO TABS: 1600.0000 mg | ORAL_TABLET | Freq: Three times a day (TID) | ORAL | Status: DC

## 2024-09-15 MED ORDER — METRONIDAZOLE 500 MG/100ML IV SOLN
500.0000 mg | Freq: Two times a day (BID) | INTRAVENOUS | Status: DC
  Administered 2024-09-15 – 2024-09-16 (×3): 500 mg via INTRAVENOUS
  Filled 2024-09-15 (×5): qty 100

## 2024-09-15 MED ORDER — HEPARIN SODIUM (PORCINE) 1000 UNIT/ML IJ SOLN
INTRAMUSCULAR | Status: AC
  Filled 2024-09-15: qty 4

## 2024-09-15 MED ORDER — IOHEXOL 300 MG/ML  SOLN
100.0000 mL | Freq: Once | INTRAMUSCULAR | Status: AC | PRN
  Administered 2024-09-15: 100 mL via INTRAVENOUS

## 2024-09-15 MED ORDER — LACTATED RINGERS IV SOLN: 150.0000 mL/h | INTRAVENOUS | Status: DC

## 2024-09-15 MED ORDER — LACTATED RINGERS IV BOLUS (SEPSIS): 1000.0000 mL | Freq: Once | INTRAVENOUS | Status: DC

## 2024-09-15 MED ORDER — VANCOMYCIN HCL IN DEXTROSE 1-5 GM/200ML-% IV SOLN: 1000.0000 mg | Freq: Once | INTRAVENOUS | Status: DC

## 2024-09-15 MED ORDER — ALBUMIN HUMAN 25 % IV SOLN
25.0000 g | Freq: Once | INTRAVENOUS | Status: AC
Start: 2024-09-15 — End: 2024-09-15
  Administered 2024-09-15: 25 g via INTRAVENOUS
  Filled 2024-09-15: qty 100

## 2024-09-15 MED ORDER — HYDROXYZINE HCL 25 MG PO TABS
50.0000 mg | ORAL_TABLET | Freq: Every day | ORAL | Status: DC
  Administered 2024-09-16 – 2024-09-18 (×3): 50 mg
  Filled 2024-09-15 (×5): qty 2

## 2024-09-15 NOTE — Progress Notes (Signed)
 Pharmacy Antibiotic Note  Brianna [name] is a 63 y.o. female with ESRD on MWF HD, admitted on 09/12/2024 with intra-abdominal infection.  Pharmacy has been consulted for Vancomycin  dosing for 7 days.  Plan: Pt given Vancomycin  2000 mg once. Vancomycin  1000 mg IV Q MWF following complete HD session.. Goal AUC 400-550.  Pharmacy will continue to follow and will adjust abx dosing whenever warranted.  Temp (24hrs), Avg:98.2 F (36.8 C), Min:97.6 F (36.4 C), Max:98.9 F (37.2 C)   Recent Labs  Lab 09/12/24 1456 09/13/24 0549 09/14/24 0429 09/14/24 1915 09/15/24 0013  WBC 8.2 20.0* 12.9*  --   --   CREATININE 7.58* 8.81* 10.45* 9.65*  --   LATICACIDVEN  --   --   --  3.1* 2.3*    Estimated Creatinine Clearance: 5.4 mL/min (A) (by C-G formula based on SCr of 9.65 mg/dL (H)).    Allergies  Allergen Reactions   Ace Inhibitors Anaphylaxis   Lisinopril Anaphylaxis   Lipitor [Atorvastatin] Other (See Comments)    Muscle aches    Antimicrobials this admission: 9/16 Ceftriaxone  >> x 7 days 9/16 Flagyl  >> x 7 days 9/16 Vancomycin  >> x 7 days  Microbiology results: 9/16 BCx: Sent  Thank you for allowing pharmacy to be a part of this patient's care.  Rankin Brianna [name], PharmD, MBA 09/15/2024 3:00 AM

## 2024-09-15 NOTE — Care Management Important Message (Signed)
 Important Message  Patient Details  Name: Brianna [name] MRN: [id number] Date of Birth: [birth year]   Important Message Given:  Yes - Medicare IM     Brianna [name] 09/15/2024, 1:39 PM

## 2024-09-15 NOTE — Plan of Care (Signed)
  Problem: Education: Goal: Knowledge of General Education information will improve Description: Including pain rating scale, medication(s)/side effects and non-pharmacologic comfort measures Outcome: Progressing   Problem: Clinical Measurements: Goal: Cardiovascular complication will be avoided Outcome: Progressing   Problem: Respiratory: Goal: Ability to maintain adequate ventilation will improve Outcome: Progressing

## 2024-09-15 NOTE — Progress Notes (Signed)
 OT Cancellation Note  Patient Details Name: Brianna [name] MRN: [id number] DOB: May 16, 1961   Cancelled Treatment:    Reason Eval/Treat Not Completed: Patient at procedure or test/ unavailable. Chart reviewed. Pt noted to be off the floor for HD, unavailable at this time. Will continue to follow and initiate services at later date/time as pt available.   Elston Slot, M.S. OTR/L  09/15/24, 10:19 AM  ascom [phone]

## 2024-09-15 NOTE — Progress Notes (Signed)
 Initial Nutrition Assessment  DOCUMENTATION CODES:   Obesity unspecified  INTERVENTION:   RD will add supplements with diet advancement   Recommend TPN if unable to initiate enteral nutrition within the next 48 hrs   Pt is at high refeed risk  Daily weights   NUTRITION DIAGNOSIS:   Inadequate oral intake related to altered GI function as evidenced by NPO status.  GOAL:   Patient will meet greater than or equal to 90% of their needs  MONITOR:   Diet advancement, Labs, Weight trends, Skin, I & O's  REASON FOR ASSESSMENT:   NPO/Clear Liquid Diet    ASSESSMENT:   63 y/o female with h/o DM, HTN, ESRD on HD, CHF, morbid obesity, chronic pain, anxiety, depression, GERD and strangulated ventral hernia s/p small bowel resection (21cm in 2021) and who is admitted with incarcerated ventral hernia with obstruction now s/p exploratory laparotomy, reduction of incarcerated and obstructed hernia, repair of ventral hernias, lysis of adhesions & omentectomy 9/14 complicated by post op ileus.  Pt unavailable at the time of RD visit today; pt receiving dialysis. Pt with poor oral intake for one day pta. Pt has remained NPO since admission. RD will add supplements with diet advancement. Recommend TPN if unable to advance pt's diet within the next 48 hrs. Pt is at high refeed risk. No bowel function yet. NGT in place with output. Per chart, pt appears to be at her UBW currently. RD will obtain exam and history at follow up.   Medications reviewed and include: heparin , insulin , protonix , renvela , ceftriaxone , metronidazole , vancomycin    Labs reviewed: K 4.5 wnl, BUN 99(H), creat 10.15(H), P 8.2(H) Wbc- 11.5(H), Hgb 10.1(L), Hct 30.9(L) Cbgs- 142, 137, 171, 169 x 24 hrs  AIC 5.6- 9/14  NUTRITION - FOCUSED PHYSICAL EXAM: Unable to perform at this time   Diet Order:   Diet Order             Diet NPO time specified  Diet effective now                  EDUCATION NEEDS:   No  education needs have been identified at this time  Skin:  Skin Assessment: Reviewed RN Assessment (incision abdomen)  Last BM:  pta  Height:   Ht Readings from Last 1 Encounters:  09/12/24 4' 9 (1.448 m)    Weight:   Wt Readings from Last 1 Encounters:  09/15/24 82.4 kg    Ideal Body Weight:  43 kg  BMI:  Body mass index is 39.31 kg/m.  Estimated Nutritional Needs:   Kcal:  1700-1900kcal/day  Protein:  85-95g/day  Fluid:  1.3-1.5L/day  Augustin Shams MS, RD, LDN If unable to be reached, please send secure chat to RD inpatient available from 8:00a-4:00p daily

## 2024-09-15 NOTE — Care Management Important Message (Signed)
 Important Message  Patient Details  Name: Brianna [name] MRN: [id number] Date of Birth: 02/22/1961   Important Message Given:  patient in dialysis and sleeping, telephone call made to uncle [name] [phone] reviewed IMM with him over the telephone and gave him the telephone number for [facility].      [name] 09/15/2024, 1:37 PM

## 2024-09-15 NOTE — Progress Notes (Signed)
 Hemodialysis Note:  Received patient in bed to unit. Alert and oriented. Informed consent singed and in chart.  Treatment initiated: 1030 Treatment completed: 1304  Access used: Right internal jugular catheter Access issues: None  Transported back to room, alert without acute distress. Report given to patient's RN.  Total UF removed: 1 Liter Medications given: Albumin  25 gm IV  Post HD weight: 82.4 Kg  [facility]

## 2024-09-15 NOTE — Plan of Care (Signed)
  Problem: Education: Goal: Knowledge of General Education information will improve Description: Including pain rating scale, medication(s)/side effects and non-pharmacologic comfort measures Outcome: Not Progressing   Problem: Health Behavior/Discharge Planning: Goal: Ability to manage health-related needs will improve Outcome: Not Progressing   Problem: Education: Goal: Knowledge of General Education information will improve Description: Including pain rating scale, medication(s)/side effects and non-pharmacologic comfort measures Outcome: Not Progressing   Problem: Clinical Measurements: Goal: Ability to maintain clinical measurements within normal limits will improve Outcome: Not Progressing Goal: Will remain free from infection Outcome: Not Progressing Goal: Diagnostic test results will improve Outcome: Not Progressing Goal: Respiratory complications will improve Outcome: Not Progressing Goal: Cardiovascular complication will be avoided Outcome: Not Progressing   Problem: Activity: Goal: Risk for activity intolerance will decrease Outcome: Not Progressing   Problem: Nutrition: Goal: Adequate nutrition will be maintained Outcome: Not Progressing   Problem: Coping: Goal: Level of anxiety will decrease Outcome: Not Progressing   Problem: Elimination: Goal: Will not experience complications related to bowel motility Outcome: Not Progressing Goal: Will not experience complications related to urinary retention Outcome: Not Progressing   Problem: Pain Managment: Goal: General experience of comfort will improve and/or be controlled Outcome: Not Progressing   Problem: Safety: Goal: Ability to remain free from injury will improve Outcome: Not Progressing   Problem: Skin Integrity: Goal: Risk for impaired skin integrity will decrease Outcome: Not Progressing   Problem: Education: Goal: Ability to describe self-care measures that may prevent or decrease complications  (Diabetes Survival Skills Education) will improve Outcome: Not Progressing Goal: Individualized Educational Video(s) Outcome: Not Progressing   Problem: Coping: Goal: Ability to adjust to condition or change in health will improve Outcome: Not Progressing   Problem: Fluid Volume: Goal: Ability to maintain a balanced intake and output will improve Outcome: Not Progressing   Problem: Health Behavior/Discharge Planning: Goal: Ability to identify and utilize available resources and services will improve Outcome: Not Progressing Goal: Ability to manage health-related needs will improve Outcome: Not Progressing   Problem: Metabolic: Goal: Ability to maintain appropriate glucose levels will improve Outcome: Not Progressing   Problem: Nutritional: Goal: Maintenance of adequate nutrition will improve Outcome: Not Progressing Goal: Progress toward achieving an optimal weight will improve Outcome: Not Progressing   Problem: Skin Integrity: Goal: Risk for impaired skin integrity will decrease Outcome: Not Progressing   Problem: Tissue Perfusion: Goal: Adequacy of tissue perfusion will improve Outcome: Not Progressing   Problem: Fluid Volume: Goal: Hemodynamic stability will improve Outcome: Not Progressing   Problem: Clinical Measurements: Goal: Diagnostic test results will improve Outcome: Not Progressing Goal: Signs and symptoms of infection will decrease Outcome: Not Progressing   Problem: Respiratory: Goal: Ability to maintain adequate ventilation will improve Outcome: Not Progressing

## 2024-09-15 NOTE — Progress Notes (Addendum)
 [facility] SURGICAL PROGRESS NOTE  Hospital Day(s): 2.   Post op day(s): 3 Days Post-Op.   Interval History:  Patient seen and examined Late yesterday and overnight she continued to have issues with tachycardia and tachypnea  She was transferred to step down Blood pressure remains soft; no pressor need as of this AM She did get CTA which was negative for PE This morning, she denied any abdominal pain She has not has fevers Her leukocytosis continues to improve; WBC 11.5K Hgb to 10.1 BMP consistent with known ESRD Her lactic did peak to 3.6 overnight; normalized this AM at 1.4 NGT replaced; output 750 ccs Now on rocephin , flagyl , vancomycin   Vital signs in last 24 hours: [min-max] current  Temp:  [97.6 F (36.4 C)-99.5 F (37.5 C)] 99 F (37.2 C) (09/16 0606) Pulse Rate:  [98-116] 99 (09/16 0830) Resp:  [16-45] 31 (09/16 0830) BP: (78-154)/(25-117) 108/46 (09/16 0815) SpO2:  [90 %-100 %] 97 % (09/16 0830) Weight:  [83.2 kg] 83.2 kg (09/15 1204)     Height: 4' 9 (144.8 cm) Weight: 83.2 kg BMI (Calculated): 39.68   Intake/Output last 2 shifts:  09/15 0701 - 09/16 0700 In: 2083.6 [P.O.:480; IV Piggyback:1603.6] Out: 750 [Emesis/NG output:750]   Physical Exam:  Constitutional: alert, cooperative and no distress  Respiratory: She is on CPAP  Cardiovascular: tachycardic and sinus rhythm  Gastrointestinal: soft, non-tender, and non-distended, no rebound/guarding. She does not appear overtly peritonitic.  Integumentary: Midline with Prevena in place; serosanguinous output   Labs:     Latest Ref Rng & Units 09/15/2024    3:11 AM 09/14/2024    4:29 AM 09/13/2024    5:49 AM  CBC  WBC 4.0 - 10.5 K/uL 11.5  12.9  20.0   Hemoglobin 12.0 - 15.0 g/dL 89.8  88.0  87.1   Hematocrit 36.0 - 46.0 % 30.9  37.7  40.2   Platelets 150 - 400 K/uL 243  252  332       Latest Ref Rng & Units 09/15/2024    3:11 AM 09/14/2024    7:15 PM 09/14/2024    4:29 AM  CMP  Glucose  70 - 99 mg/dL 824  797  828   BUN 8 - 23 mg/dL 99  87  73   Creatinine 0.44 - 1.00 mg/dL 89.84  0.34  89.54   Sodium 135 - 145 mmol/L 140  140  143   Potassium 3.5 - 5.1 mmol/L 4.5  4.6  5.0   Chloride 98 - 111 mmol/L 95  96  99   CO2 22 - 32 mmol/L 20  17  22    Calcium 8.9 - 10.3 mg/dL 9.8  89.7  9.9   Total Protein 6.5 - 8.1 g/dL 7.2     Total Bilirubin 0.0 - 1.2 mg/dL 0.9     Alkaline Phos 38 - 126 U/L 106     AST 15 - 41 U/L 50     ALT 0 - 44 U/L <5       Imaging studies: No new pertinent imaging studies   Assessment/Plan:  63 y.o. female with tachypnea, tachycardia, and hypotension overnight 3 Days Post-Op s/p exploratory laparotomy, reduction of incarcerated ventral hernia, and placement of Prevena wound vac complicated by pertinent comorbidities including ESRD.    - Appreciate medicine assistance; PCCM aware in case need for vasopressor support - Low threshold to repeat CT Abdomen/Pelvis to ensure no missed intra-abdominal source of deterioration overnight. Suspicion is low clinically but  she is very fragile given her ESRD and CHF.   - Continue NPO for now - Continue NGT decompression; monitor and record output   - Continue IV Abx for now   - Monitor abdominal examination; on-going bowel function   - Monitor leukocytosis; improving; likely reactive - Pain control prn; avoid nephrotoxic agents  - Antiemetics prn             - Okay to mobilize; continue abdominal binder - will engage therapies although suspect she will not be able to work with them today given her acute changes.              - Appreciate nephrology assistance with ESRD   All of the above findings and recommendations were discussed with the patient, and the medical team, and all of patient's questions were answered to her expressed satisfaction.  -- [name], PA-C [facility] 09/15/2024, 9:05 AM M-F: 7am - 4pm

## 2024-09-15 NOTE — Progress Notes (Addendum)
 CROSS COVER NOTE  NAME: Brianna [name] MRN: [id number] DOB : July 02, 1961    Concern as stated by nurse / staff   hypotension     Pertinent findings on chart review: 63 y.o. female with PMH type II DM, ESRD on hemodialysis, OSA on CPAP, chronic pain syndrome, chronic HFpEF, GERD, gout, who was admitted to the hospital for incarcerated abdominal hernia s/p exploratory laparotomy on 09/13/2024.  The hospitalist team was consulted on 09/14/24 because of tachycardia.     Patient Assessment Patient assessed at bedside.  Bedside nurse present Date/Time Temp Pulse ECG Heart Rate Resp BP SpO2  09/15/24 0114 98.2 F (36.8 C) 103 Abnormal  -- 22 Abnormal  85/57 Abnormal  100 %  09/14/24 22:27:32 98 F (36.7 C) 111 Abnormal  -- 38 Abnormal  104/65 100 % on 4L  09/14/24 2000 -- -- 112 Abnormal  -- -- --  09/14/24 18:46:44 98.2 F (36.8 C) 113 Abnormal  -- 37 Abnormal  132/75 100 %  09/14/24 1800 98.1 F (36.7 C) -- -- 37 Abnormal  -- 98 % on 2L  09/14/24 17:37:43 98 F (36.7 C) 113 Abnormal  -- 34 Abnormal  120/85 98 %  09/14/24 16:02:49 98.4 F (36.9 C) 114 Abnormal  -- 35 Abnormal  110/81 96 %  09/14/24 15:18:12 98.6 F (37 C) 114 Abnormal  -- 34 Abnormal  147/80 Abnormal  100 %  09/14/24 1500 98.4 F (36.9 C) -- 113 Abnormal  36 Abnormal  146/82 Abnormal  99 %  09/14/24 1421 -- -- -- -- -- --  Physical Exam Constitutional:      Comments: Somnolent but unarousable. On BiPAP  HENT:     Nose:     Comments: NG tube draining brownish fluid Cardiovascular:     Rate and Rhythm: Tachycardia present.  Pulmonary:     Effort: Tachypnea present.  Abdominal:     General: There is distension.  Neurological:     General: No focal deficit present.        CTA chest MPRESSION: 1. No evidence of pulmonary embolism. 2. Partial collapse of the lower lobes bilaterally and volume loss with superimposed pulmonary hypoinflation. 3. Small relatively high density pericardial effusion,  slightly enlarged since prior CT examination of the abdomen and pelvis of 09/12/2024  Assessment and  Interventions   Assessment:  -SIRS (tachycardia, tachypnea, hypoxia, hypotension) -Sepsis versus PE vs fluid overload--> CTA chest negative for PE -Pericardial effusion, small on CTA chest -Acute respiratory failure with hypoxia -S/p ventral hernia repair 9/14    Plan: Sepsis orders placed empirically Transfer to stepdown for closer monitoring CTA chest negative for PE Monitor for fluid overload with sepsis fluids in view of dialysis status Bedside nurse has already inform surgical team who deferred to hospitalist team  Transfer to stepdown for closer monitoring      CRITICAL CARE Performed by: Delayne LULLA Solian   Total critical care time: 100 minutes  Critical care time was exclusive of separately billable procedures and treating other patients.  Critical care was necessary to treat or prevent imminent or life-threatening deterioration.  Critical care was time spent personally by me on the following activities: development of treatment plan with patient and/or surrogate as well as nursing, discussions with consultants, evaluation of patient's response to treatment, examination of patient, obtaining history from patient or surrogate, ordering and performing treatments and interventions, ordering and review of laboratory studies, ordering and review of radiographic studies, pulse oximetry and  re-evaluation of patient's condition.

## 2024-09-15 NOTE — Progress Notes (Signed)
 Progress Note    Brianna [name]  FMW:[id number] DOB: [birth year]  DOA: 09/12/2024 PCP: Cecily Katz, PA-C      Brief Narrative:    Medical records reviewed and are as summarized below:  Brianna [name] is a 63 y.o. female with PMH type II DM, ESRD on hemodialysis, OSA on CPAP, chronic pain syndrome, chronic HFpEF, GERD, gout, who was admitted to the hospital for incarcerated abdominal hernia s/p exploratory laparotomy on 09/13/2024.  The hospitalist team was consulted because of tachycardia.       Assessment/Plan:   Principal Problem:   Incarcerated ventral hernia Active Problems:   Hernia of abdominal wall    Body mass index is 39.69 kg/m.-Class II obesity)    Incarcerated abdominal hernia: S/p exploratory laparotomy on 09/13/2024.  Repeat abdominal x-ray on 08/14/2024 showed dilated loops of bowel in the upper abdomen consistent with bowel obstruction. NG tube was placed with immediate output. She is NPO.  She has been started on empiric IV antibiotics. Leukocytosis, improving Lactic acidosis: Improved Follow-up with general surgeon.   Hypotension: S/p IV fluid bolus Monitor BP closely.  Consider IV vasopressors if hypotension worsens   Acute respiratory distress: She is on BiPAP.  She uses CPAP at night.   Sinus tachycardia: Improved.  CTA chest did not show any evidence of pneumonia or pulmonary embolism.    ESRD: Follow-up with nephrologist for hemodialysis.   Chronic HFpEF: Compensated   Gout: Stable.  Decreased allopurinol  from 300 mg to 100 mg daily (renal dose)  Diet Order             Diet NPO time specified  Diet effective now                                  Consultants: General Surgeon  Procedures: Ex lap on 09/13/2024    Medications:    allopurinol   100 mg Oral Daily   Chlorhexidine  Gluconate Cloth  6 each Topical Daily   ezetimibe   10 mg Oral [id number]   heparin   5,000 Units Subcutaneous Q8H    hydrOXYzine   50 mg Oral Daily   insulin  aspart  0-9 Units Subcutaneous Q4H   pantoprazole  (PROTONIX ) IV  40 mg Intravenous Q24H   sertraline   25 mg Oral Daily   sevelamer  carbonate  1,600 mg Oral TID with meals   Continuous Infusions:  albumin  human     cefTRIAXone  (ROCEPHIN )  IV Stopped (09/15/24 0529)   metronidazole  Stopped (09/15/24 0607)   [START ON 09/16/2024] vancomycin        Anti-infectives (From admission, onward)    Start     Dose/Rate Route Frequency Ordered Stop   09/16/24 1200  vancomycin  (VANCOCIN ) IVPB 1000 mg/200 mL premix        1,000 mg 200 mL/hr over 60 Minutes Intravenous Every M-W-F (Hemodialysis) 09/15/24 0309 09/23/24 1159   09/15/24 0400  metroNIDAZOLE  (FLAGYL ) IVPB 500 mg        500 mg 100 mL/hr over 60 Minutes Intravenous Every 12 hours 09/15/24 0255 09/22/24 0559   09/15/24 0400  vancomycin  (VANCOREADY) IVPB 2000 mg/400 mL        2,000 mg 200 mL/hr over 120 Minutes Intravenous  Once 09/15/24 0300 09/15/24 0737   09/15/24 0345  vancomycin  (VANCOCIN ) IVPB 1000 mg/200 mL premix  Status:  Discontinued        1,000 mg 200 mL/hr over 60 Minutes Intravenous  Once 09/15/24  0255 09/15/24 0300   09/15/24 0330  cefTRIAXone  (ROCEPHIN ) 2 g in sodium chloride  0.9 % 100 mL IVPB        2 g 200 mL/hr over 30 Minutes Intravenous Daily 09/15/24 0255 09/22/24 0559   09/13/24 0800  ceFAZolin  (ANCEF ) IVPB 1 g/50 mL premix  Status:  Discontinued        1 g 100 mL/hr over 30 Minutes Intravenous Every 8 hours 09/13/24 0241 09/13/24 1301   09/12/24 2015  ceFAZolin  (ANCEF ) IVPB 2g/100 mL premix        2 g 200 mL/hr over 30 Minutes Intravenous  Once 09/12/24 1958 09/13/24 0031              Family Communication/Anticipated D/C date and plan/Code Status   DVT prophylaxis: heparin  injection 5,000 Units Start: 09/13/24 0945 SCDs Start: 09/13/24 0859 SCDs Start: 09/13/24 0242     Code Status: Full Code  Family Communication: None Disposition Plan: Plan to  discharge home         Subjective:   Interval events noted.  Patient was transferred to the stepdown unit for closer monitoring because of hypotension and respiratory distress. She feels better this morning and has no complaints.  No shortness of breath or chest pain.  No abdominal pain or vomiting.  Objective:    Vitals:   09/15/24 0745 09/15/24 0800 09/15/24 0815 09/15/24 0830  BP: (!) 106/54 (!) 121/106 (!) 108/46   Pulse: 99 98 98 99  Resp: (!) 32 16 (!) 30 (!) 31  Temp:      TempSrc:      SpO2: 94% 96% 97% 97%  Weight:      Height:       No data found.   Intake/Output Summary (Last 24 hours) at 09/15/2024 0856 Last data filed at 09/15/2024 0701 Gross per 24 hour  Intake 2436.71 ml  Output 750 ml  Net 1686.71 ml   Filed Weights   09/12/24 1447 09/14/24 1100 09/14/24 1204  Weight: 81.6 kg 83.2 kg 83.2 kg    Exam:   GEN: NAD, BiPAP SKIN: Warm and dry EYES: No pallor or icterus ENT: MMM, NG tube in place CV: RRR PULM: CTA B ABD: soft, obese, +wound vac, NT, +BS, +abdominal binder CNS: AAO x 3, non focal EXT: No edema or tenderness      Data Reviewed:   I have personally reviewed following labs and imaging studies:  Labs: Labs show the following:   Basic Metabolic Panel: Recent Labs  Lab 09/12/24 1456 09/13/24 0549 09/14/24 0429 09/14/24 1915 09/15/24 0311  NA 141 142 143 140 140  K 3.5 4.4 5.0 4.6 4.5  CL 93* 94* 99 96* 95*  CO2 25 20* 22 17* 20*  GLUCOSE 190* 238* 171* 202* 175*  BUN 44* 57* 73* 87* 99*  CREATININE 7.58* 8.81* 10.45* 9.65* 10.15*  CALCIUM 10.3 9.6 9.9 10.2 9.8   GFR Estimated Creatinine Clearance: 5.1 mL/min (A) (by C-G formula based on SCr of 10.15 mg/dL (H)). Liver Function Tests: Recent Labs  Lab 09/12/24 1456 09/15/24 0311  AST 23 50*  ALT 15 <5  ALKPHOS 114 106  BILITOT 0.8 0.9  PROT 8.7* 7.2  ALBUMIN  4.3 3.1*   Recent Labs  Lab 09/12/24 1456  LIPASE 32   No results for input(s): AMMONIA in  the last 168 hours. Coagulation profile Recent Labs  Lab 09/15/24 0311  INR 2.1*    CBC: Recent Labs  Lab 09/12/24 1456 09/13/24 0549 09/14/24 0429  09/15/24 0311  WBC 8.2 20.0* 12.9* 11.5*  NEUTROABS  --   --  10.2* 9.0*  HGB 13.2 12.8 11.9* 10.1*  HCT 41.7 40.2 37.7 30.9*  MCV 94.1 93.9 93.8 92.2  PLT 263 332 252 243   Cardiac Enzymes: No results for input(s): CKTOTAL, CKMB, CKMBINDEX, TROPONINI in the last 168 hours. BNP (last 3 results) No results for input(s): PROBNP in the last 8760 hours. CBG: Recent Labs  Lab 09/14/24 1145 09/14/24 1629 09/14/24 1959 09/15/24 0018 09/15/24 0340  GLUCAP 179* 185* 216* 169* 171*   D-Dimer: No results for input(s): DDIMER in the last 72 hours. Hgb A1c: Recent Labs    09/13/24 0549  HGBA1C 5.6   Lipid Profile: No results for input(s): CHOL, HDL, LDLCALC, TRIG, CHOLHDL, LDLDIRECT in the last 72 hours. Thyroid function studies: Recent Labs    09/13/24 0549  TSH 4.814*   Anemia work up: No results for input(s): VITAMINB12, FOLATE, FERRITIN, TIBC, IRON, RETICCTPCT in the last 72 hours. Sepsis Labs: Recent Labs  Lab 09/12/24 1456 09/13/24 0549 09/14/24 0429 09/14/24 1915 09/15/24 0013 09/15/24 0311 09/15/24 0619  WBC 8.2 20.0* 12.9*  --   --  11.5*  --   LATICACIDVEN  --   --   --  3.1* 2.3* 3.6* 1.4    Microbiology Recent Results (from the past 240 hours)  Culture, blood (x 2)     Status: None (Preliminary result)   Collection Time: 09/15/24  3:11 AM   Specimen: BLOOD RIGHT HAND  Result Value Ref Range Status   Specimen Description BLOOD RIGHT HAND  Final   Special Requests   Final    BOTTLES DRAWN AEROBIC ONLY Blood Culture adequate volume   Culture   Final    NO GROWTH < 12 HOURS Performed at [facility], [address] [place], [place] [address]    Report Status PENDING  Incomplete  Culture, blood (x 2)     Status: None (Preliminary result)    Collection Time: 09/15/24  3:11 AM   Specimen: BLOOD RIGHT ARM  Result Value Ref Range Status   Specimen Description BLOOD RIGHT ARM  Final   Special Requests   Final    BOTTLES DRAWN AEROBIC ONLY Blood Culture adequate volume   Culture   Final    NO GROWTH < 12 HOURS Performed at [facility], [address] [place], [place] [address]    Report Status PENDING  Incomplete  MRSA Next Gen by PCR, Nasal     Status: None   Collection Time: 09/15/24  4:16 AM   Specimen: Nasal Mucosa; Nasal Swab  Result Value Ref Range Status   MRSA by PCR Next Gen NOT DETECTED NOT DETECTED Final    Comment: (NOTE) The GeneXpert MRSA Assay (FDA approved for NASAL specimens only), is one component of a comprehensive MRSA colonization surveillance program. It is not intended to diagnose MRSA infection nor to guide or monitor treatment for MRSA infections. Test performance is not FDA approved in patients less than 38 years old. Performed at [facility], [address] [place], [place] [address]     Procedures and diagnostic studies:  CT Angio Chest Pulmonary Embolism (PE) W or WO Contrast Result Date: 09/15/2024 EXAM: CTA of the Chest with contrast for PE 09/15/2024 01:32:05 AM TECHNIQUE: CTA of the chest was performed after the administration of intravenous contrast. Multiplanar reformatted images are provided for review. MIP images are provided for review. Automated exposure control, iterative reconstruction,  and/or weight based adjustment of the mA/kV was utilized to reduce the radiation dose to as low as reasonably achievable. COMPARISON: None available. CLINICAL HISTORY: Tachypnea and tachycardia post-op. Acute onset of shortness of breath. FINDINGS: PULMONARY ARTERIES: The central pulmonary arteries are of normal caliber. No intraluminal filling defects identified to suggest acute pulmonary embolism. MEDIASTINUM: Mild cardiomegaly with left ventricular hypertrophy. Small relatively  high density pericardial effusion is present suggesting hemorrhagic or proteinaceous fluid, slightly enlarged since prior CT examination of the abdomen and pelvis of 09/12/2024. Extensive multivessel coronary artery calcifications. Mild atherosclerotic calcification within the thoracic aorta. No aortic aneurysm. LYMPH NODES: No mediastinal, hilar or axillary lymphadenopathy. LUNGS AND PLEURA: There is partial collapse of the lower lobes bilaterally and volume loss with superimposed pulmonary hypoinflation. Vascular crowding at the hila. No pneumothorax. No pleural effusion. UPPER ABDOMEN: Dilated gas and fluid-filled loops of bowel within the visualized upper abdomen as well as the third portion of the duodenum suggest a postoperative adynamic ileus or distal bowel obstruction given the relatively decompressed nature of the colon. Nasogastric tube tip is seen within the mid body of the stomach. Small hiatal hernia. The esophagus is unremarkable. SOFT TISSUES AND BONES: No acute bone abnormality. Right internal jugular hemodialysis catheter tip is seen from the region of the hepatic caval junction. Withdrawal of the catheter by roughly 4 cm with more centrally positioned the tip within the right atrium. IMPRESSION: 1. No evidence of pulmonary embolism. 2. Partial collapse of the lower lobes bilaterally and volume loss with superimposed pulmonary hypoinflation. 3. Small relatively high density pericardial effusion, slightly enlarged since prior CT examination of the abdomen and pelvis of 09/12/2024. 4. Dilated gas and fluid-filled loops of bowel within the visualized upper abdomen and third portion of the duodenum, suggestive of postoperative adynamic ileus or distal bowel obstruction given the relatively decompressed nature of the colon. 5. Right internal jugular hemodialysis catheter tip is seen from the region of the hepatic caval junction. Withdrawal of the catheter by roughly 4 cm will more centrally position the  tip within the right atrium 6. Extensive coronary artery calcifications. Cardiomegaly. Electronically signed by: Dorethia Molt MD 09/15/2024 01:49 AM EDT RP Workstation: [id number]   DG Abd 1 View Result Date: 09/14/2024 EXAM: 1 VIEW XRAY OF THE ABDOMEN 09/14/2024 11:16:22 PM COMPARISON: None available. CLINICAL HISTORY: Encounter for nasogastric (NG) tube placement. FINDINGS: LINES, TUBES AND DEVICES: Nasogastric tube tip overlies the expected distal esophagus. Right internal jugular hemodialysis catheter tip seen within the right atrium. BOWEL: Dilated loops of bowel were seen within the upper abdomen in keeping with an underlying bowel obstruction. SOFT TISSUES: No opaque urinary calculi. BONES: No acute osseous abnormality. IMPRESSION: 1. Dilated loops of bowel in the upper abdomen, consistent with bowel obstruction. 2. Nasogastric tube tip overlies the expected distal esophagus. Electronically signed by: Dorethia Molt MD 09/14/2024 11:20 PM EDT RP Workstation: [id number]   DG Chest 1 View Result Date: 09/14/2024 EXAM: 1 VIEW XRAY OF THE CHEST 09/14/2024 08:48:00 PM COMPARISON: 09/12/2024 CLINICAL HISTORY: Abdominal distention, emesis, and SOB. Hx of asthma, diabetes, HTN, and abdominal surgery. FINDINGS: LINES, TUBES AND DEVICES: Stable right IJ hemodialysis catheter, tip within the right atrium. LUNGS AND PLEURA: Extremely low lung volumes with bibasilar subsegmental atelectasis. No focal pulmonary opacity. No pulmonary edema. No pleural effusion. No pneumothorax. HEART AND MEDIASTINUM: Aortic atherosclerosis. No acute abnormality of the cardiac and mediastinal silhouettes. BONES AND SOFT TISSUES: No acute osseous abnormality. IMPRESSION: 1. Extremely low lung volumes with bibasilar subsegmental  atelectasis. 2. Gaseous distention of stomach. Electronically signed by: Dorethia Molt MD 09/14/2024 09:00 PM EDT RP Workstation: [id number]   DG Abd 1 View Result Date: 09/14/2024 EXAM: 1 VIEW XRAY OF THE  ABDOMEN 09/14/2024 08:48:00 PM COMPARISON: None available. CLINICAL HISTORY: Abdominal distention, emesis, and SOB. Hx of asthma, diabetes, HTN, and abdominal surgery. FINDINGS: LINES, TUBES AND DEVICES: Hemodialysis catheter in place with tip overlying the deep right atrium. BOWEL: Dilated small bowel loops consistent with postoperative ileus or obstruction. SOFT TISSUES: Calcified fibroid in the pelvis. BONES: No acute osseous abnormality. IMPRESSION: 1. Dilated small bowel loops consistent with postoperative ileus or obstruction. Electronically signed by: Dorethia Molt MD 09/14/2024 08:59 PM EDT RP Workstation: [id number]               LOS: 2 days   Schelly Chuba  [facility]   Pager on [email or web address]. If 7PM-7AM, please contact night-coverage at [email or web address]     09/15/2024, 8:56 AM

## 2024-09-15 NOTE — Progress Notes (Signed)
 [facility]  ROUNDING NOTE   Subjective:   Brianna [name] is a 63 year old female with past medical conditions including obstructive sleep apnea on CPAP, gout, GERD, type 2 diabetes, and end-stage renal disease on hemodialysis.  Patient presented to the hospital due to incarcerated and obstructed ventral hernia, experienced exploratory laparotomy on 9/13.  Patient is currently admitted for SBO (small bowel obstruction) (HCC) [K56.609] Hernia of abdominal wall [K43.9] Incarcerated ventral hernia [K43.6]  Postoperatively patient found to be tachycardic and requiring supplemental oxygen, 3 L.  Patient is known to our practice and receives outpatient dialysis treatments at [facility] on a MWF schedule, supervised by Dr. Marcelino.    Update: Patient seen laying in bed Bipap in place  Scheduled for dialysis later this morning     Objective:  Vital signs in last 24 hours:  Temp:  [97.6 F (36.4 C)-99.5 F (37.5 C)] 99.2 F (37.3 C) (09/16 1019) Pulse Rate:  [98-116] 99 (09/16 1045) Resp:  [16-45] 28 (09/16 1045) BP: (78-154)/(25-117) 100/49 (09/16 1045) SpO2:  [90 %-100 %] 99 % (09/16 1045) Weight:  [83.2 kg-83.4 kg] 83.4 kg (09/16 1019)  Weight change:  Filed Weights   09/14/24 1100 09/14/24 1204 09/15/24 1019  Weight: 83.2 kg 83.2 kg 83.4 kg    Intake/Output: I/O last 3 completed shifts: In: 2083.6 [P.O.:480; IV Piggyback:1603.6] Out: 1100 [Emesis/NG output:1100]   Intake/Output this shift:  Total I/O In: 353.1 [IV Piggyback:353.1] Out: -   Physical Exam: General: NAD  Head: Normocephalic, atraumatic. Moist oral mucosal membranes  Eyes: Anicteric  Neck: Supple  Lungs:  Basilar rales, bipap  Heart: Regular rate and rhythm  Abdomen:  Soft, tender, NPWV in place, abdominal binder  Extremities: No peripheral edema.  Neurologic: Awake, alert, conversant  Skin: Warm,dry, no rash  Access: Left upper aVF, right IJ PermCath    Basic Metabolic  Panel: Recent Labs  Lab 09/12/24 1456 09/13/24 0549 09/14/24 0429 09/14/24 1915 09/15/24 0311  NA 141 142 143 140 140  K 3.5 4.4 5.0 4.6 4.5  CL 93* 94* 99 96* 95*  CO2 25 20* 22 17* 20*  GLUCOSE 190* 238* 171* 202* 175*  BUN 44* 57* 73* 87* 99*  CREATININE 7.58* 8.81* 10.45* 9.65* 10.15*  CALCIUM 10.3 9.6 9.9 10.2 9.8    Liver Function Tests: Recent Labs  Lab 09/12/24 1456 09/15/24 0311  AST 23 50*  ALT 15 <5  ALKPHOS 114 106  BILITOT 0.8 0.9  PROT 8.7* 7.2  ALBUMIN  4.3 3.1*   Recent Labs  Lab 09/12/24 1456  LIPASE 32   No results for input(s): AMMONIA in the last 168 hours.  CBC: Recent Labs  Lab 09/12/24 1456 09/13/24 0549 09/14/24 0429 09/15/24 0311  WBC 8.2 20.0* 12.9* 11.5*  NEUTROABS  --   --  10.2* 9.0*  HGB 13.2 12.8 11.9* 10.1*  HCT 41.7 40.2 37.7 30.9*  MCV 94.1 93.9 93.8 92.2  PLT 263 332 252 243    Cardiac Enzymes: No results for input(s): CKTOTAL, CKMB, CKMBINDEX, TROPONINI in the last 168 hours.  BNP: Invalid input(s): POCBNP  CBG: Recent Labs  Lab 09/14/24 1629 09/14/24 1959 09/15/24 0018 09/15/24 0340 09/15/24 0948  GLUCAP 185* 216* 169* 171* 137*    Microbiology: Results for orders placed or performed during the hospital encounter of 09/12/24  Culture, blood (x 2)     Status: None (Preliminary result)   Collection Time: 09/15/24  3:11 AM   Specimen: BLOOD RIGHT HAND  Result Value  Ref Range Status   Specimen Description BLOOD RIGHT HAND  Final   Special Requests   Final    BOTTLES DRAWN AEROBIC ONLY Blood Culture adequate volume   Culture   Final    NO GROWTH < 12 HOURS Performed at [facility], [address] [place], [place] [address]    Report Status PENDING  Incomplete  Culture, blood (x 2)     Status: None (Preliminary result)   Collection Time: 09/15/24  3:11 AM   Specimen: BLOOD RIGHT ARM  Result Value Ref Range Status   Specimen Description BLOOD RIGHT ARM  Final   Special Requests    Final    BOTTLES DRAWN AEROBIC ONLY Blood Culture adequate volume   Culture   Final    NO GROWTH < 12 HOURS Performed at [facility], [address], [place], [place] [address]    Report Status PENDING  Incomplete  MRSA Next Gen by PCR, Nasal     Status: None   Collection Time: 09/15/24  4:16 AM   Specimen: Nasal Mucosa; Nasal Swab  Result Value Ref Range Status   MRSA by PCR Next Gen NOT DETECTED NOT DETECTED Final    Comment: (NOTE) The GeneXpert MRSA Assay (FDA approved for NASAL specimens only), is one component of a comprehensive MRSA colonization surveillance program. It is not intended to diagnose MRSA infection nor to guide or monitor treatment for MRSA infections. Test performance is not FDA approved in patients less than 19 years old. Performed at [facility], [address] [place], [place] [address]     Coagulation Studies: Recent Labs    09/15/24 0311  LABPROT 24.2*  INR 2.1*    Urinalysis: No results for input(s): COLORURINE, LABSPEC, PHURINE, GLUCOSEU, HGBUR, BILIRUBINUR, KETONESUR, PROTEINUR, UROBILINOGEN, NITRITE, LEUKOCYTESUR in the last 72 hours.  Invalid input(s): APPERANCEUR    Imaging: CT Angio Chest Pulmonary Embolism (PE) W or WO Contrast Result Date: 09/15/2024 EXAM: CTA of the Chest with contrast for PE 09/15/2024 01:32:05 AM TECHNIQUE: CTA of the chest was performed after the administration of intravenous contrast. Multiplanar reformatted images are provided for review. MIP images are provided for review. Automated exposure control, iterative reconstruction, and/or weight based adjustment of the mA/kV was utilized to reduce the radiation dose to as low as reasonably achievable. COMPARISON: None available. CLINICAL HISTORY: Tachypnea and tachycardia post-op. Acute onset of shortness of breath. FINDINGS: PULMONARY ARTERIES: The central pulmonary arteries are of normal caliber. No intraluminal filling  defects identified to suggest acute pulmonary embolism. MEDIASTINUM: Mild cardiomegaly with left ventricular hypertrophy. Small relatively high density pericardial effusion is present suggesting hemorrhagic or proteinaceous fluid, slightly enlarged since prior CT examination of the abdomen and pelvis of 09/12/2024. Extensive multivessel coronary artery calcifications. Mild atherosclerotic calcification within the thoracic aorta. No aortic aneurysm. LYMPH NODES: No mediastinal, hilar or axillary lymphadenopathy. LUNGS AND PLEURA: There is partial collapse of the lower lobes bilaterally and volume loss with superimposed pulmonary hypoinflation. Vascular crowding at the hila. No pneumothorax. No pleural effusion. UPPER ABDOMEN: Dilated gas and fluid-filled loops of bowel within the visualized upper abdomen as well as the third portion of the duodenum suggest a postoperative adynamic ileus or distal bowel obstruction given the relatively decompressed nature of the colon. Nasogastric tube tip is seen within the mid body of the stomach. Small hiatal hernia. The esophagus is unremarkable. SOFT TISSUES AND BONES: No acute bone abnormality. Right internal jugular hemodialysis catheter tip is seen from the region of  the hepatic caval junction. Withdrawal of the catheter by roughly 4 cm with more centrally positioned the tip within the right atrium. IMPRESSION: 1. No evidence of pulmonary embolism. 2. Partial collapse of the lower lobes bilaterally and volume loss with superimposed pulmonary hypoinflation. 3. Small relatively high density pericardial effusion, slightly enlarged since prior CT examination of the abdomen and pelvis of 09/12/2024. 4. Dilated gas and fluid-filled loops of bowel within the visualized upper abdomen and third portion of the duodenum, suggestive of postoperative adynamic ileus or distal bowel obstruction given the relatively decompressed nature of the colon. 5. Right internal jugular hemodialysis  catheter tip is seen from the region of the hepatic caval junction. Withdrawal of the catheter by roughly 4 cm will more centrally position the tip within the right atrium 6. Extensive coronary artery calcifications. Cardiomegaly. Electronically signed by: Dorethia Molt MD 09/15/2024 01:49 AM EDT RP Workstation: [id number]   DG Abd 1 View Result Date: 09/14/2024 EXAM: 1 VIEW XRAY OF THE ABDOMEN 09/14/2024 11:16:22 PM COMPARISON: None available. CLINICAL HISTORY: Encounter for nasogastric (NG) tube placement. FINDINGS: LINES, TUBES AND DEVICES: Nasogastric tube tip overlies the expected distal esophagus. Right internal jugular hemodialysis catheter tip seen within the right atrium. BOWEL: Dilated loops of bowel were seen within the upper abdomen in keeping with an underlying bowel obstruction. SOFT TISSUES: No opaque urinary calculi. BONES: No acute osseous abnormality. IMPRESSION: 1. Dilated loops of bowel in the upper abdomen, consistent with bowel obstruction. 2. Nasogastric tube tip overlies the expected distal esophagus. Electronically signed by: Dorethia Molt MD 09/14/2024 11:20 PM EDT RP Workstation: [id number]   DG Chest 1 View Result Date: 09/14/2024 EXAM: 1 VIEW XRAY OF THE CHEST 09/14/2024 08:48:00 PM COMPARISON: 09/12/2024 CLINICAL HISTORY: Abdominal distention, emesis, and SOB. Hx of asthma, diabetes, HTN, and abdominal surgery. FINDINGS: LINES, TUBES AND DEVICES: Stable right IJ hemodialysis catheter, tip within the right atrium. LUNGS AND PLEURA: Extremely low lung volumes with bibasilar subsegmental atelectasis. No focal pulmonary opacity. No pulmonary edema. No pleural effusion. No pneumothorax. HEART AND MEDIASTINUM: Aortic atherosclerosis. No acute abnormality of the cardiac and mediastinal silhouettes. BONES AND SOFT TISSUES: No acute osseous abnormality. IMPRESSION: 1. Extremely low lung volumes with bibasilar subsegmental atelectasis. 2. Gaseous distention of stomach. Electronically signed  by: Dorethia Molt MD 09/14/2024 09:00 PM EDT RP Workstation: [id number]   DG Abd 1 View Result Date: 09/14/2024 EXAM: 1 VIEW XRAY OF THE ABDOMEN 09/14/2024 08:48:00 PM COMPARISON: None available. CLINICAL HISTORY: Abdominal distention, emesis, and SOB. Hx of asthma, diabetes, HTN, and abdominal surgery. FINDINGS: LINES, TUBES AND DEVICES: Hemodialysis catheter in place with tip overlying the deep right atrium. BOWEL: Dilated small bowel loops consistent with postoperative ileus or obstruction. SOFT TISSUES: Calcified fibroid in the pelvis. BONES: No acute osseous abnormality. IMPRESSION: 1. Dilated small bowel loops consistent with postoperative ileus or obstruction. Electronically signed by: Dorethia Molt MD 09/14/2024 08:59 PM EDT RP Workstation: [id number]     Medications:    cefTRIAXone  (ROCEPHIN )  IV Stopped (09/15/24 0529)   metronidazole  Stopped (09/15/24 0607)   [START ON 09/16/2024] vancomycin       allopurinol   100 mg Oral Daily   Chlorhexidine  Gluconate Cloth  6 each Topical Daily   ezetimibe   10 mg Oral BH-q7a   heparin   5,000 Units Subcutaneous Q8H   hydrOXYzine   50 mg Oral Daily   insulin  aspart  0-9 Units Subcutaneous Q4H   pantoprazole  (PROTONIX ) IV  40 mg Intravenous Q24H   sertraline   25 mg Oral Daily  sevelamer  carbonate  1,600 mg Oral TID with meals   albuterol , ALPRAZolam , hydrALAZINE , HYDROmorphone  (DILAUDID ) injection, methocarbamol  (ROBAXIN ) injection, ondansetron  (ZOFRAN ) IV, ondansetron , zolpidem   Assessment/ Plan:  [name] is a 63 y.o.  female with past medical conditions including obstructive sleep apnea on CPAP, gout, GERD, type 2 diabetes, and end-stage renal disease on hemodialysis.  Patient presented to the hospital due to incarcerated and obstructed ventral hernia, experienced exploratory laparotomy on 9/13.  Patient is currently admitted for SBO (small bowel obstruction) (HCC) [K56.609] Hernia of abdominal wall [K43.9] Incarcerated ventral  hernia [K43.6]  [facility]/MWF/right IJ PermCath/left upper AVF  End-stage renal disease on hemodialysis. Due to diaphoresis, patient received 45 min of treatment yesterday. Will complete treatment today.   2. Acute respiratory failure, placed on Bipap in ICU. Likely secondary to fluid volume oveload, will provide dialysis today with increased UF.   3. Anemia of chronic kidney disease Lab Results  Component Value Date   HGB 10.1 (L) 09/15/2024    Hemoglobin acceptable.  Will continue to monitor for now.  4.  Incarcerated ventral hernia status post exploratory laparotomy on 9/13.  Wound VAC placed.  General surgery continues to follow and offer supportive measures and pain management.  5. Secondary Hyperparathyroidism: with outpatient labs: PTH 993, phosphorus 4.9, calcium 9.5 on 09/07/24.   Lab Results  Component Value Date   PTH 651 (H) 05/24/2023   CALCIUM 9.8 09/15/2024   CAION 1.11 (L) 08/08/2023   PHOS 4.6 05/31/2023    Bone minerals within optimal range. Awaiting updated Phos   LOS: 2 Brianna [name] 9/16/202510:59 AM

## 2024-09-15 NOTE — Progress Notes (Signed)
 CODE SEPSIS - PHARMACY COMMUNICATION  **Broad Spectrum Antibiotics should be administered within 1 hour of Sepsis diagnosis**  Time Code Sepsis Called/Page Received: 9741  Antibiotics Ordered: Ceftriaxone , Flagyl , Vancomycin   Time of 1st antibiotic administration: 0459  Additional action taken by pharmacy: Pt was being transferred from [facility] to [facility]U, delaying abx administration.  Rankin [name], PharmD, [facility] 09/15/2024 3:02 AM

## 2024-09-15 NOTE — Sepsis Progress Note (Signed)
Followed for sepsis monitoring 

## 2024-09-15 NOTE — Progress Notes (Signed)
 Covid swab and 20 pathogen panel has been tube down to lab.

## 2024-09-15 NOTE — Consult Note (Signed)
 NAME:  Brianna [name], MRN:  [id number], DOB:  [birth year], LOS: 2 ADMISSION DATE:  09/12/2024, CONSULTATION DATE:  09/15/2024 REFERRING MD:  Dr. Jayson Endow, CHIEF COMPLAINT:  Hypotension, respiratory distress   Brief Pt Description / Synopsis:  Brianna [name] is a 63 y.o female with multiple comorbidities, including ESRD on dialysis, HFpEF, DM type II, and OSA on CPAP at home. She underwent exploratory laparotomy with Dr. Endow in general surgery on 09/12/24 for an incarcerated ventral hernia. Her post-op hospital course was complicated by persistent tachycardia, respiratory distress requiring BiPAP support, hypotension and her ESRD. She remains on BiPAP this morning and BP is overall improving without vasopressor support. She is completing HD today, 09/16.   History of Present Illness:  Brianna [name] is a 63 y.o F with a significant PMHx of type II DM, ESRD on hemodialysis, OSA on CPAP, chronic pain syndrome, chronic HFpEF, GERD, and gout who was admitted to the hospital for incarcerated abdominal hernia s/p exploratory laparotomy on 09/12/2024. During her post-op hospital admission, the patient has been having issues with tachycardia and tachypnea, requiring supplemental oxygen. PCCM consulted for potential need for vasopressor support and/or CRRT.    Please see Significant Hospital Events section below for full detailed hospital course.   Pertinent  Medical History  ESRD on hemodialysis (MWF), OSA on CPAP, type II DM, Chronic HFpEF, gout  Micro Data:  9/16: -Lactic 3.1 > 1.4 today (resolved) -Cr 9.65 > 10.15 today; baseline appears to be ~3.5 -eGFR 4; baseline ~11 -AGAP 25 -WBC down trending 20 >> 12.9 >> 11.5 today -BCx pending, no growth at this point -MRSA PCR >>neg   Antimicrobials:   Anti-infectives (From admission, onward)    Start     Dose/Rate Route Frequency Ordered Stop   09/16/24 1200  vancomycin  (VANCOCIN ) IVPB 1000 mg/200 mL premix        1,000 mg 200  mL/hr over 60 Minutes Intravenous Every M-W-F (Hemodialysis) 09/15/24 0309 09/23/24 1159   09/15/24 0400  metroNIDAZOLE  (FLAGYL ) IVPB 500 mg        500 mg 100 mL/hr over 60 Minutes Intravenous Every 12 hours 09/15/24 0255 09/22/24 0559   09/15/24 0400  vancomycin  (VANCOREADY) IVPB 2000 mg/400 mL        2,000 mg 200 mL/hr over 120 Minutes Intravenous  Once 09/15/24 0300 09/15/24 0737   09/15/24 0345  vancomycin  (VANCOCIN ) IVPB 1000 mg/200 mL premix  Status:  Discontinued        1,000 mg 200 mL/hr over 60 Minutes Intravenous  Once 09/15/24 0255 09/15/24 0300   09/15/24 0330  cefTRIAXone  (ROCEPHIN ) 2 g in sodium chloride  0.9 % 100 mL IVPB        2 g 200 mL/hr over 30 Minutes Intravenous Daily 09/15/24 0255 09/22/24 0559   09/13/24 0800  ceFAZolin  (ANCEF ) IVPB 1 g/50 mL premix  Status:  Discontinued        1 g 100 mL/hr over 30 Minutes Intravenous Every 8 hours 09/13/24 0241 09/13/24 1301   09/12/24 2015  ceFAZolin  (ANCEF ) IVPB 2g/100 mL premix        2 g 200 mL/hr over 30 Minutes Intravenous  Once 09/12/24 1958 09/13/24 0031       Significant Hospital Events: Including procedures, antibiotic start and stop dates in addition to other pertinent events   9/13: exploratory laparotomy and LOA, repair of incarcerated ventral hernia, omentectomy with general surgery. Ancef  started prior to surgery and given for 24 hours.  9/14: patient became  persistently tachycardic and acutely hypoxic, requiring 3L [place] 9/15: was weaned off of oxygen and overall improving, and then became anxious and diaphoretic during dialysis treatment with nephrology. Patient requested to d/c treatment. Patient became SOB, requiring CPAP placement. CT-A negative for PE. 9/16: patient requiring CPAP this morning. BP of 87/57 overnight, but not requiring pressor support at this time. Getting HD today per nephrology, PCCM consulted for potential vasopressors ~ BP improving though still overall remains soft.   Interim History /  Subjective:  As outlined above under Significant Hospital Events section  Objective   Blood pressure (!) 101/25, pulse 99, temperature 99 F (37.2 C), temperature source Axillary, resp. rate (!) 32, height 4' 9 (1.448 m), weight 83.2 kg, SpO2 94%.        Intake/Output Summary (Last 24 hours) at 09/15/2024 0819 Last data filed at 09/15/2024 0701 Gross per 24 hour  Intake 2436.71 ml  Output 750 ml  Net 1686.71 ml   Filed Weights   09/12/24 1447 09/14/24 1100 09/14/24 1204  Weight: 81.6 kg 83.2 kg 83.2 kg    Examination: General: Laying in bed, some distress with CPAP, NGT, and abdominal binder in place.  HENT: Normocephalic, atraumatic. No JVD, mucus membranes moist.  Lungs: On CPAP Cardiovascular: NSR with audible S1, S2, tachycardic Abdomen: soft, minor tenderness w/o rebound or guarding Extremities: No peripheral edema  Neuro: Awake, alert GU: Deferred  Resolved Hospital Problem list   9/16: Lactic acidosis resolved   Assessment & Plan:  #Acute Hypoxic Respiratory Failure -CPAP as needed for increased WOB and respiratory distress, likely secondary to fluid overload and ESRD -CTA chest negative for PE or PNA, concern for partial collapse of the bilateral lower lobes with hypoinflation -CXR on 9/15 showing very low lung volumes, likely chronic -Wean oxygen requirements as able -Maintain SpO2 > 93%  #Tachycardia #Transient Hypotension #Small Pericardial Effusion noted on CTa Chest -Monitor BP closely, consider vasopressor if needed for sustained MAP < 65 -Small pericardial effusion noted on CT-A ~ bedside POCUS by Dr. Isadora without evidence of Tamponade -Consider repeat Echocardiogram   #ESRD on Hemodialysis  #Anion gap secondary to lactic acidosis -Monitor I&O's / urinary output -Follow BMP -Ensure adequate renal perfusion -Avoid nephrotoxic agents as able -Replace electrolytes as indicated ~ Pharmacy following for assistance with electrolyte  replacement -Nephrology following, appreciate input ~ HD as per Nephrology   #Anemia of Chronic Disease #DM type 2 -Nephrology on board and monitoring labs; Hb and bone minerals within acceptable range. Continue to monitor w/ daily labs  -Glycemic protocol  -Monitor I/O, net negative for now  #Incarcerated ventral hernia status post exploratory laparotomy on 09/12/24 #Bowel obstruction vs. Post-op Ileus -Wound vac in place, management per general surgery team -KUB showing distended stomach & loops of small bowels -Continue NPO and NGT decompression -Repeat KUB in the am -Frequent abdominal exams   #SIRS criteria met  #Concern for Sepsis, suspect intraabdominal process due to recent Incarcerated ventral hernia post repair -WBC remains elevated, but is trending down 12.9 >> 11.5. Continue to track w/ CBC  -Monitor for fever, signs of infection -Continue empiric IV abx -CT Abdomen is currently pending to rule out acute abdomen or other intraabdominal process   #Acute Metabolic Encephalopathy  -Treatment of metabolic derangements as outlined above -Provide supportive care -Promote normal sleep/wake cycle and family presence -Avoid sedating medications as able    Best Practice (right click and Reselect all SmartList Selections daily)   Diet/type: NPO, NGT in place to LIS  DVT prophylaxis: systemic heparin  GI prophylaxis: PPI Lines: Chronic right internal jugular HD cathether Foley:  N/A Code Status:  full code Last date of multidisciplinary goals of care discussion [9/16]  9/16: Pt updated at bedside on plan of care.  Labs   CBC: Recent Labs  Lab 09/12/24 1456 09/13/24 0549 09/14/24 0429 09/15/24 0311  WBC 8.2 20.0* 12.9* 11.5*  NEUTROABS  --   --  10.2* 9.0*  HGB 13.2 12.8 11.9* 10.1*  HCT 41.7 40.2 37.7 30.9*  MCV 94.1 93.9 93.8 92.2  PLT 263 332 252 243    Basic Metabolic Panel: Recent Labs  Lab 09/12/24 1456 09/13/24 0549 09/14/24 0429 09/14/24 1915  09/15/24 0311  NA 141 142 143 140 140  K 3.5 4.4 5.0 4.6 4.5  CL 93* 94* 99 96* 95*  CO2 25 20* 22 17* 20*  GLUCOSE 190* 238* 171* 202* 175*  BUN 44* 57* 73* 87* 99*  CREATININE 7.58* 8.81* 10.45* 9.65* 10.15*  CALCIUM 10.3 9.6 9.9 10.2 9.8   GFR: Estimated Creatinine Clearance: 5.1 mL/min (A) (by C-G formula based on SCr of 10.15 mg/dL (H)). Recent Labs  Lab 09/12/24 1456 09/13/24 0549 09/14/24 0429 09/14/24 1915 09/15/24 0013 09/15/24 0311 09/15/24 0619  WBC 8.2 20.0* 12.9*  --   --  11.5*  --   LATICACIDVEN  --   --   --  3.1* 2.3* 3.6* 1.4    Liver Function Tests: Recent Labs  Lab 09/12/24 1456 09/15/24 0311  AST 23 50*  ALT 15 <5  ALKPHOS 114 106  BILITOT 0.8 0.9  PROT 8.7* 7.2  ALBUMIN  4.3 3.1*   Recent Labs  Lab 09/12/24 1456  LIPASE 32   No results for input(s): AMMONIA in the last 168 hours.  ABG    Component Value Date/Time   TCO2 25 08/08/2023 0627     Coagulation Profile: Recent Labs  Lab 09/15/24 0311  INR 2.1*    Cardiac Enzymes: No results for input(s): CKTOTAL, CKMB, CKMBINDEX, TROPONINI in the last 168 hours.  HbA1C: Hgb A1c MFr Bld  Date/Time Value Ref Range Status  09/13/2024 05:49 AM 5.6 4.8 - 5.6 % Final    Comment:    (NOTE) Diagnosis of Diabetes The following HbA1c ranges recommended by the American Diabetes Association (ADA) may be used as an aid in the diagnosis of diabetes mellitus.  Hemoglobin             Suggested A1C NGSP%              Diagnosis  <5.7                   Non Diabetic  5.7-6.4                Pre-Diabetic  >6.4                   Diabetic  <7.0                   Glycemic control for                       adults with diabetes.    05/23/2023 09:30 AM 6.3 (H) 4.8 - 5.6 % Final    Comment:    (NOTE)         Prediabetes: 5.7 - 6.4         Diabetes: >6.4         Glycemic control for  adults with diabetes: <7.0     CBG: Recent Labs  Lab 09/14/24 1145 09/14/24 1629  09/14/24 1959 09/15/24 0018 09/15/24 0340  GLUCAP 179* 185* 216* 169* 171*    Review of Systems:   Positives in BOLD: Gen: Denies fever, chills, weight change, fatigue, night sweats HEENT: Denies blurred vision, double vision, hearing loss, tinnitus, sinus congestion, rhinorrhea, sore throat, neck stiffness, dysphagia PULM: Denies cough, sputum production, hemoptysis, wheezing. Shortness of breath on BiPAP, mild distress CV: Denies chest pain, edema, orthopnea, paroxysmal nocturnal dyspnea, palpitations GI: Minor post-surgical abdominal pain with NGT, nausea. No vomiting, diarrhea, hematochezia, melena, constipation, change in bowel habits GU: Denies dysuria, hematuria, polyuria, oliguria, urethral discharge Endocrine: Denies hot or cold intolerance, polyuria, polyphagia or appetite change Derm: Denies rash, dry skin, scaling or peeling skin change Heme: Denies easy bruising, bleeding, bleeding gums Neuro: Denies headache, numbness, weakness, slurred speech, loss of memory or consciousness   Past Medical History:  She,  has a past medical history of Acid reflux, Anasarca, Anemia, Asthma, Bilateral leg edema, Bronchopneumonia, Chronic heart failure with preserved ejection fraction (HFpEF) (HCC), CKD (chronic kidney disease), stage IV (HCC), DM (diabetes mellitus), type 2 (HCC), Dyspnea, Hypertension, Hypokalemia, Morbid obesity (HCC), and Strangulated ventral hernia.   Surgical History:   Past Surgical History:  Procedure Laterality Date   A/V FISTULAGRAM Left 01/28/2024   Procedure: A/V Fistulagram;  Surgeon: Jama Cordella MATSU, MD;  Location: [facility];  Service: Cardiovascular;  Laterality: Left;   A/V FISTULAGRAM Left 04/14/2024   Procedure: A/V Fistulagram;  Surgeon: Jama Cordella MATSU, MD;  Location: [facility];  Service: Cardiovascular;  Laterality: Left;   AV FISTULA PLACEMENT Left 08/08/2023   Procedure: ARTERIOVENOUS (AV) FISTULA CREATION (RADIALCEPHALIC);   Surgeon: Marea Selinda RAMAN, MD;  Location: [facility];  Service: Vascular;  Laterality: Left;  regional with MAC   BOWEL RESECTION  02/09/2020   Procedure: SMALL BOWEL RESECTION;  Surgeon: Desiderio Schanz, MD;  Location: [facility];  Service: General;;   DIALYSIS/PERMA CATHETER INSERTION N/A 05/28/2023   Procedure: DIALYSIS/PERMA CATHETER INSERTION;  Surgeon: Jama Cordella MATSU, MD;  Location: [facility];  Service: Cardiovascular;  Laterality: N/A;   LAPAROSCOPIC TRANSABDOMINAL HERNIA N/A 09/12/2024   Procedure: REPAIR, HERNIA, ABDOMINAL APPROACH;  Surgeon: Marinda Jayson KIDD, MD;  Location: [facility];  Service: General;  Laterality: N/A;   LAPAROTOMY N/A 02/09/2020   Procedure: EXPLORATORY LAPAROTOMY;  Surgeon: Desiderio Schanz, MD;  Location: [facility];  Service: General;  Laterality: N/A;   LYSIS OF ADHESION  09/12/2024   Procedure: LAPAROTOMY, FOR LYSIS OF ADHESIONS;  Surgeon: Marinda Jayson KIDD, MD;  Location: [facility];  Service: General;;   OMENTECTOMY  09/12/2024   Procedure: OMENTECTOMY;  Surgeon: Marinda Jayson KIDD, MD;  Location: [facility];  Service: General;;     Social History:   reports that she has never smoked. She has never used smokeless tobacco. She reports that she does not drink alcohol and does not use drugs.   Family History:  Her family history is negative for Breast cancer.   Allergies Allergies  Allergen Reactions   Ace Inhibitors Anaphylaxis   Lisinopril Anaphylaxis   Lipitor [Atorvastatin] Other (See Comments)    Muscle aches     Home Medications  Prior to Admission medications   Medication Sig Start Date End Date Taking? Authorizing Provider  albuterol  (VENTOLIN  HFA) 108 (90 Base) MCG/ACT inhaler Inhale 1-2 puffs into the lungs every 6 (six) hours as needed for wheezing or  shortness of breath.   Yes [provider]  allopurinol  (ZYLOPRIM ) 300 MG tablet Take 300 mg by mouth every morning. 12/21/19  Yes [provider]  amLODipine  (NORVASC ) 5 MG tablet Take 5  mg by mouth daily. 02/11/24  Yes [provider]  ASPIRIN  ADULT LOW STRENGTH 81 MG EC tablet Take 81 mg by mouth daily. 12/21/19  Yes [provider]  carvedilol  (COREG ) 12.5 MG tablet Take 1 tablet (12.5 mg total) by mouth 2 (two) times daily before a meal. 05/31/23  Yes Laurita Pillion, MD  cetirizine (ZYRTEC) 10 MG tablet Take 10 mg by mouth every morning. 09/18/19  Yes [provider]  cyanocobalamin  (VITAMIN B12) 1000 MCG tablet Take 1 tablet (1,000 mcg total) by mouth daily. 05/31/23  Yes Laurita Pillion, MD  FEROSUL 325 (65 Fe) MG tablet Take 325 mg by mouth every other day. 05/22/23  Yes [provider]  furosemide  (LASIX ) 80 MG tablet Take 1 tablet (80 mg total) by mouth daily. For leg swelling or weight gain >5lbs Patient taking differently: Take 80 mg by mouth every morning. For leg swelling or weight gain >5lbs 01/19/21  Yes Sebastian Toribio GAILS, MD  HYDROcodone -acetaminophen  (NORCO/VICODIN) 5-325 MG tablet Take 1 tablet by mouth 2 (two) times daily as needed for severe pain (pain score 7-10). Must last 30 days 08/20/24 09/19/24 Yes Patel, Seema K, NP  lactulose  (CHRONULAC ) 10 GM/15ML solution Take 10 g by mouth daily as needed for mild constipation. 03/09/24  Yes [provider]  omeprazole (PRILOSEC) 20 MG capsule Take 20 mg by mouth every morning. 12/21/19  Yes [provider]  OZEMPIC, 1 MG/DOSE, 4 MG/3ML SOPN Inject 1 mg into the skin once a week. 08/28/24  Yes [provider]  sertraline  (ZOLOFT ) 25 MG tablet Take 25 mg by mouth daily. 01/23/24  Yes [provider]  triamcinolone ointment (KENALOG) 0.5 % Apply 1 Application topically 2 (two) times daily as needed. 07/01/23  Yes [provider]  Vitamin D , Ergocalciferol , (DRISDOL) 1.25 MG (50000 UNIT) CAPS capsule Take 50,000 Units by mouth once a week. 11/20/23  Yes [provider]  ZETIA  10 MG tablet Take 10 mg by mouth every morning. 06/20/23  Yes [provider]  zolpidem  (AMBIEN ) 5 MG tablet Take 1 tablet (5 mg total) by mouth at bedtime as needed for sleep. 01/19/21  Yes Sebastian Toribio GAILS, MD  albuterol  (PROVENTIL ) (2.5 MG/3ML) 0.083% nebulizer solution Take 2.5 mg by nebulization every 4 (four) hours as needed for shortness of breath or wheezing. 12/21/19   [provider]  sevelamer  carbonate (RENVELA ) 800 MG tablet Take 1,600 mg by mouth 3 (three) times daily. Patient not taking: Reported on 09/13/2024 03/13/24   [provider]     Critical care time: 45 minutes

## 2024-09-15 NOTE — Progress Notes (Signed)
 Patient's Blood Pressure remains soft 87/57 after 250 saline bolus, and a cascade of other respiratory and GI problems tonight. MD notified and transfer orders were given for a stepdown bed in the ICU. Patient alert and denies pain during transfer. Respiratory distress minimized and pt able to transfer on CPAP. Report given to ICU RN; care responsibilities transferred.

## 2024-09-16 DIAGNOSIS — K56609 Unspecified intestinal obstruction, unspecified as to partial versus complete obstruction: Secondary | ICD-10-CM

## 2024-09-16 DIAGNOSIS — R579 Shock, unspecified: Secondary | ICD-10-CM

## 2024-09-16 DIAGNOSIS — N186 End stage renal disease: Secondary | ICD-10-CM

## 2024-09-16 DIAGNOSIS — K439 Ventral hernia without obstruction or gangrene: Secondary | ICD-10-CM

## 2024-09-16 DIAGNOSIS — K436 Other and unspecified ventral hernia with obstruction, without gangrene: Secondary | ICD-10-CM | POA: Diagnosis not present

## 2024-09-16 LAB — CBC
HCT: 30 % — ABNORMAL LOW (ref 36.0–46.0)
Hemoglobin: 9.7 g/dL — ABNORMAL LOW (ref 12.0–15.0)
MCH: 29.9 pg (ref 26.0–34.0)
MCHC: 32.3 g/dL (ref 30.0–36.0)
MCV: 92.6 fL (ref 80.0–100.0)
Platelets: 198 K/uL (ref 150–400)
RBC: 3.24 MIL/uL — ABNORMAL LOW (ref 3.87–5.11)
RDW: 15.5 % (ref 11.5–15.5)
WBC: 7.9 K/uL (ref 4.0–10.5)
nRBC: 0.5 % — ABNORMAL HIGH (ref 0.0–0.2)

## 2024-09-16 LAB — RENAL FUNCTION PANEL
Albumin: 3 g/dL — ABNORMAL LOW (ref 3.5–5.0)
Anion gap: 25 — ABNORMAL HIGH (ref 5–15)
BUN: 73 mg/dL — ABNORMAL HIGH (ref 8–23)
CO2: 21 mmol/L — ABNORMAL LOW (ref 22–32)
Calcium: 9.2 mg/dL (ref 8.9–10.3)
Chloride: 93 mmol/L — ABNORMAL LOW (ref 98–111)
Creatinine, Ser: 7.52 mg/dL — ABNORMAL HIGH (ref 0.44–1.00)
GFR, Estimated: 6 mL/min — ABNORMAL LOW (ref >60)
Glucose, Bld: 107 mg/dL — ABNORMAL HIGH (ref 70–99)
Phosphorus: 7 mg/dL — ABNORMAL HIGH (ref 2.5–4.6)
Potassium: 4.5 mmol/L (ref 3.5–5.1)
Sodium: 139 mmol/L (ref 135–145)

## 2024-09-16 LAB — GLUCOSE, CAPILLARY
Glucose-Capillary: 108 mg/dL — ABNORMAL HIGH (ref 70–99)
Glucose-Capillary: 110 mg/dL — ABNORMAL HIGH (ref 70–99)
Glucose-Capillary: 122 mg/dL — ABNORMAL HIGH (ref 70–99)
Glucose-Capillary: 101 mg/dL — ABNORMAL HIGH (ref 70–99)
Glucose-Capillary: 101 mg/dL — ABNORMAL HIGH (ref 70–99)

## 2024-09-16 LAB — MAGNESIUM: Magnesium: 2.1 mg/dL (ref 1.7–2.4)

## 2024-09-16 MED ORDER — ALBUMIN HUMAN 25 % IV SOLN
INTRAVENOUS | Status: AC
  Filled 2024-09-16: qty 50

## 2024-09-16 MED ORDER — HEPARIN SODIUM (PORCINE) 1000 UNIT/ML IJ SOLN
INTRAMUSCULAR | Status: AC
  Filled 2024-09-16: qty 4

## 2024-09-16 MED ORDER — MIDODRINE HCL 5 MG PO TABS
5.0000 mg | ORAL_TABLET | Freq: Once | ORAL | Status: AC
Start: 2024-09-16 — End: 2024-09-16
  Administered 2024-09-16: 5 mg
  Filled 2024-09-16: qty 1

## 2024-09-16 MED ORDER — ALBUMIN HUMAN 25 % IV SOLN
25.0000 g | Freq: Once | INTRAVENOUS | Status: AC
  Administered 2024-09-16: 25 g via INTRAVENOUS
  Filled 2024-09-16: qty 100

## 2024-09-16 MED ORDER — CHLORHEXIDINE GLUCONATE CLOTH 2 % EX PADS
6.0000 | MEDICATED_PAD | Freq: Every day | CUTANEOUS | Status: DC
  Administered 2024-09-17 – 2024-09-19 (×3): 6 via TOPICAL

## 2024-09-16 NOTE — Progress Notes (Signed)
 Progress Note    Brianna [name]  FMW:[id number] DOB: 1961/02/26  DOA: 09/12/2024 PCP: Cecily Katz, PA-C      Brief Narrative:    Medical records reviewed and are as summarized below:  Brianna [name] is a 63 y.o. female with PMH type II DM, ESRD on hemodialysis, OSA on CPAP, chronic pain syndrome, chronic HFpEF, GERD, gout, who was admitted to the hospital for incarcerated abdominal hernia s/p exploratory laparotomy on 09/13/2024.  The hospitalist team was consulted because of tachycardia and respiratory distress. Patient was transferred to stepdown and temporarily placed on BiPAP, later able to wean to nasal cannula at 3 L.  No baseline oxygen use.  Patient also developed postsurgical ileus requiring NG tube placement for decompression.  9/17: Vital stable, blood pressure now improved, she did develop hypotension responded to IV fluid, does not require any pressors.  Started having bowel movement.  Surgery would like to keep NJ tube in for another day due to significant amount of secretions.  Going for dialysis today  Assessment/Plan:   Principal Problem:   Incarcerated ventral hernia Active Problems:   ESRD (end stage renal disease) (HCC)   Hernia of abdominal wall   Shock circulatory (HCC)   SBO (small bowel obstruction) (HCC)   Body mass index is 39.36 kg/m.-Class II obesity)  Incarcerated abdominal hernia:  Postoperative ileus. S/p exploratory laparotomy on 09/13/2024.  Repeat abdominal x-ray on 09/14/2024 showed dilated loops of bowel in the upper abdomen consistent with bowel obstruction. NG tube was placed with immediate output. She is NPO.  She has been started on empiric IV antibiotics. Leukocytosis, improving Lactic acidosis: Improved Follow-up with general surgeon. Started having bowel movement but still having significant amount of secretions through NG tube so surgery would like to keep for another day.  Hypotension: Improved with IV  fluid - Continue to monitor  Acute respiratory distress: Initially required BiPAP transiently and then wean to nasal cannula, currently saturating 100% on 2 L of oxygen, she uses CPAP at night. - Continue supplemental oxygen-wean as tolerated  Sinus tachycardia: Improved.  CTA chest did not show any evidence of pneumonia or pulmonary embolism.   ESRD: Follow-up with nephrologist for hemodialysis.  Chronic HFpEF: Compensated  Gout: Stable.  Decreased allopurinol  from 300 mg to 100 mg daily (renal dose)  Diet Order             Diet NPO time specified  Diet effective now                  Consultants: General Surgeon Nephrology  Procedures: Ex lap on 09/13/2024  Medications:    allopurinol   100 mg Per Tube Daily   [START ON 09/17/2024] Chlorhexidine  Gluconate Cloth  6 each Topical QHS   ezetimibe   10 mg Per Tube Daily   heparin   5,000 Units Subcutaneous Q8H   hydrOXYzine   50 mg Per Tube Daily   insulin  aspart  0-9 Units Subcutaneous Q4H   pantoprazole  (PROTONIX ) IV  40 mg Intravenous Q24H   sertraline   25 mg Per Tube Daily   Continuous Infusions:  albumin  human     cefTRIAXone  (ROCEPHIN )  IV Stopped (09/16/24 0544)   metronidazole  100 mL/hr at 09/16/24 0552   vancomycin        Anti-infectives (From admission, onward)    Start     Dose/Rate Route Frequency Ordered Stop   09/16/24 1200  vancomycin  (VANCOCIN ) IVPB 1000 mg/200 mL premix        1,000  mg 200 mL/hr over 60 Minutes Intravenous Every M-W-F (Hemodialysis) 09/15/24 0309 09/23/24 1159   09/15/24 0400  metroNIDAZOLE  (FLAGYL ) IVPB 500 mg        500 mg 100 mL/hr over 60 Minutes Intravenous Every 12 hours 09/15/24 0255 09/22/24 0559   09/15/24 0400  vancomycin  (VANCOREADY) IVPB 2000 mg/400 mL        2,000 mg 200 mL/hr over 120 Minutes Intravenous  Once 09/15/24 0300 09/15/24 0737   09/15/24 0345  vancomycin  (VANCOCIN ) IVPB 1000 mg/200 mL premix  Status:  Discontinued        1,000 mg 200 mL/hr over 60 Minutes  Intravenous  Once 09/15/24 0255 09/15/24 0300   09/15/24 0330  cefTRIAXone  (ROCEPHIN ) 2 g in sodium chloride  0.9 % 100 mL IVPB        2 g 200 mL/hr over 30 Minutes Intravenous Daily 09/15/24 0255 09/22/24 0559   09/13/24 0800  ceFAZolin  (ANCEF ) IVPB 1 g/50 mL premix  Status:  Discontinued        1 g 100 mL/hr over 30 Minutes Intravenous Every 8 hours 09/13/24 0241 09/13/24 1301   09/12/24 2015  ceFAZolin  (ANCEF ) IVPB 2g/100 mL premix        2 g 200 mL/hr over 30 Minutes Intravenous  Once 09/12/24 1958 09/13/24 0031       Family Communication/Anticipated D/C date and plan/Code Status   DVT prophylaxis: heparin  injection 5,000 Units Start: 09/13/24 0945 SCDs Start: 09/13/24 0242     Code Status: Full Code  Family Communication: Primary team is communicating with family. Disposition Plan: Plan to discharge home   Subjective:   Patient was seen and examined today.  Denies any significant pain.  No shortness of breath.  Having bowel movement.  Objective:    Vitals:   09/16/24 1044 09/16/24 1112 09/16/24 1200 09/16/24 1300  BP: (!) 164/36 (!) 127/57 (!) 111/29 99/67  Pulse: 98 95 95 94  Resp: (!) 31 (!) 24 20 19   Temp:   98.2 F (36.8 C)   TempSrc:   Oral   SpO2: 95% 98% 98% 99%  Weight:      Height:       No data found.   Intake/Output Summary (Last 24 hours) at 09/16/2024 1354 Last data filed at 09/16/2024 1034 Gross per 24 hour  Intake 260.39 ml  Output 700 ml  Net -439.61 ml   Filed Weights   09/15/24 1019 09/15/24 1304 09/16/24 0940  Weight: 83.4 kg 82.4 kg 82.5 kg    Exam: General.  Ill-appearing, obese lady, in no acute distress.  NG tube in place. Pulmonary.  Lungs clear bilaterally, normal respiratory effort. CV.  Regular rate and rhythm, no JVD, rub or murmur. Abdomen.  Soft, nontender, nondistended, BS positive.  Central abdominal wound VAC in place CNS.  Alert and oriented .  No focal neurologic deficit. Extremities.  No edema, no cyanosis, pulses  intact and symmetrical.   Data Reviewed:   I have personally reviewed following labs and imaging studies:  Labs: Labs show the following:   Basic Metabolic Panel: Recent Labs  Lab 09/13/24 0549 09/14/24 0429 09/14/24 1915 09/15/24 0311 09/16/24 0327  NA 142 143 140 140 139  K 4.4 5.0 4.6 4.5 4.5  CL 94* 99 96* 95* 93*  CO2 20* 22 17* 20* 21*  GLUCOSE 238* 171* 202* 175* 107*  BUN 57* 73* 87* 99* 73*  CREATININE 8.81* 10.45* 9.65* 10.15* 7.52*  CALCIUM 9.6 9.9 10.2 9.8 9.2  MG  --   --   --   --  2.1  PHOS  --   --   --  8.2* 7.0*   GFR Estimated Creatinine Clearance: 6.9 mL/min (A) (by C-G formula based on SCr of 7.52 mg/dL (H)). Liver Function Tests: Recent Labs  Lab 09/12/24 1456 09/15/24 0311 09/16/24 0327  AST 23 50*  --   ALT 15 <5  --   ALKPHOS 114 106  --   BILITOT 0.8 0.9  --   PROT 8.7* 7.2  --   ALBUMIN  4.3 3.1* 3.0*   Recent Labs  Lab 09/12/24 1456  LIPASE 32   No results for input(s): AMMONIA in the last 168 hours. Coagulation profile Recent Labs  Lab 09/15/24 0311  INR 2.1*    CBC: Recent Labs  Lab 09/12/24 1456 09/13/24 0549 09/14/24 0429 09/15/24 0311 09/16/24 0327  WBC 8.2 20.0* 12.9* 11.5* 7.9  NEUTROABS  --   --  10.2* 9.0*  --   HGB 13.2 12.8 11.9* 10.1* 9.7*  HCT 41.7 40.2 37.7 30.9* 30.0*  MCV 94.1 93.9 93.8 92.2 92.6  PLT 263 332 252 243 198   Cardiac Enzymes: No results for input(s): CKTOTAL, CKMB, CKMBINDEX, TROPONINI in the last 168 hours. BNP (last 3 results) No results for input(s): PROBNP in the last 8760 hours. CBG: Recent Labs  Lab 09/15/24 1926 09/15/24 2310 09/16/24 0337 09/16/24 0743 09/16/24 1127  GLUCAP 101* 97 108* 110* 122*   D-Dimer: No results for input(s): DDIMER in the last 72 hours. Hgb A1c: No results for input(s): HGBA1C in the last 72 hours.  Lipid Profile: No results for input(s): CHOL, HDL, LDLCALC, TRIG, CHOLHDL, LDLDIRECT in the last 72  hours. Thyroid function studies: No results for input(s): TSH, T4TOTAL, T3FREE, THYROIDAB in the last 72 hours.  Invalid input(s): FREET3  Anemia work up: No results for input(s): VITAMINB12, FOLATE, FERRITIN, TIBC, IRON, RETICCTPCT in the last 72 hours. Sepsis Labs: Recent Labs  Lab 09/13/24 0549 09/14/24 0429 09/14/24 1915 09/15/24 0311 09/15/24 0619 09/15/24 1615 09/15/24 1859 09/16/24 0327  WBC 20.0* 12.9*  --  11.5*  --   --   --  7.9  LATICACIDVEN  --   --    < > 3.6* 1.4 1.0 1.0  --    < > = values in this interval not displayed.    Microbiology Recent Results (from the past 240 hours)  Culture, blood (x 2)     Status: None (Preliminary result)   Collection Time: 09/15/24  3:11 AM   Specimen: BLOOD RIGHT HAND  Result Value Ref Range Status   Specimen Description BLOOD RIGHT HAND  Final   Special Requests   Final    BOTTLES DRAWN AEROBIC ONLY Blood Culture adequate volume   Culture   Final    NO GROWTH 1 DAY Performed at [facility], [address], [place], [place] [address]    Report Status PENDING  Incomplete  Culture, blood (x 2)     Status: None (Preliminary result)   Collection Time: 09/15/24  3:11 AM   Specimen: BLOOD RIGHT ARM  Result Value Ref Range Status   Specimen Description BLOOD RIGHT ARM  Final   Special Requests   Final    BOTTLES DRAWN AEROBIC ONLY Blood Culture adequate volume   Culture   Final    NO GROWTH 1 DAY Performed at [facility], [address], [place], [place] [address]    Report Status PENDING  Incomplete  MRSA Next Gen by PCR, Nasal  Status: None   Collection Time: 09/15/24  4:16 AM   Specimen: Nasal Mucosa; Nasal Swab  Result Value Ref Range Status   MRSA by PCR Next Gen NOT DETECTED NOT DETECTED Final    Comment: (NOTE) The GeneXpert MRSA Assay (FDA approved for NASAL specimens only), is one component of a comprehensive MRSA colonization surveillance program. It is not  intended to diagnose MRSA infection nor to guide or monitor treatment for MRSA infections. Test performance is not FDA approved in patients less than 60 years old. Performed at [facility], [address] [place], [place] [address]     Procedures and diagnostic studies:  CT ABDOMEN PELVIS W CONTRAST Result Date: 09/15/2024 CLINICAL DATA:  Status post reduction and repair large incarcerated and obstructed ventral hernia on 09/13/2024. Postoperative tachycardia, hypotension and abdominal pain. EXAM: CT ABDOMEN AND PELVIS WITH CONTRAST TECHNIQUE: Multidetector CT imaging of the abdomen and pelvis was performed using the standard protocol following bolus administration of intravenous contrast. RADIATION DOSE REDUCTION: This exam was performed according to the departmental dose-optimization program which includes automated exposure control, adjustment of the mA and/or kV according to patient size and/or use of iterative reconstruction technique. CONTRAST:  OMNIPAQUE  IOHEXOL  300 MG/ML  SOLN COMPARISON:  09/12/2024 FINDINGS: Lower chest: Bilateral lower lobe atelectasis. Hepatobiliary: Normal appearance of liver. No masses or biliary dilatation. The gallbladder contains some vicariously excreted contrast. Pancreas: Unremarkable. No pancreatic ductal dilatation or surrounding inflammatory changes. Spleen: Normal in size without focal abnormality. Adrenals/Urinary Tract: Stable hyperplastic appearance of both adrenal glands without focal mass. Both kidneys have a normal appearance without hydronephrosis, calculi or lesion. The bladder is completely decompressed. Stomach/Bowel: Nasogastric tube extends to the mid stomach. Dilated proximal small bowel measuring up to 5 cm in maximum caliber. The dilated jejunum is hyperemic and fluid-filled. There does appear to be transition from dilated to nondilated small bowel somewhere in the anterior pelvis. Adhesion cannot be excluded. The colon is  decompressed. There is no evidence of recurrent hernia after recent surgical repair with expected small amount of seroma fluid and air within the previous large ventral hernia sac in pannicular fat. No free intraperitoneal air or focal abscess identified. Vascular/Lymphatic: Stable aortoiliac atherosclerosis without aneurysm. No lymphadenopathy identified. Reproductive: Stable uterine enlargement with a large dominant heavily calcified degenerated fibroid. Other: No ascites. Musculoskeletal: No acute or significant osseous findings. IMPRESSION: 1. Dilated proximal small bowel measuring up to 5 cm in maximum caliber. The dilated jejunum is hyperemic and fluid-filled. There does appear to be transition from dilated to nondilated small bowel somewhere in the anterior pelvis. Small bowel obstruction due to adhesion cannot be excluded. 2. No evidence of recurrent hernia after recent surgical repair with expected small amount of seroma fluid and air within the previous large ventral hernia sac in pannicular fat. 3. Stable uterine enlargement with a large dominant heavily calcified degenerated fibroid. 4. Stable hyperplastic appearance of both adrenal glands without focal mass. Electronically Signed   By: Marcey Moan M.D.   On: 09/15/2024 15:50   CT Angio Chest Pulmonary Embolism (PE) W or WO Contrast Result Date: 09/15/2024 EXAM: CTA of the Chest with contrast for PE 09/15/2024 01:32:05 AM TECHNIQUE: CTA of the chest was performed after the administration of intravenous contrast. Multiplanar reformatted images are provided for review. MIP images are provided for review. Automated exposure control, iterative reconstruction, and/or weight based adjustment of the mA/kV was utilized to reduce the radiation dose to as low as reasonably achievable. COMPARISON: None  available. CLINICAL HISTORY: Tachypnea and tachycardia post-op. Acute onset of shortness of breath. FINDINGS: PULMONARY ARTERIES: The central pulmonary  arteries are of normal caliber. No intraluminal filling defects identified to suggest acute pulmonary embolism. MEDIASTINUM: Mild cardiomegaly with left ventricular hypertrophy. Small relatively high density pericardial effusion is present suggesting hemorrhagic or proteinaceous fluid, slightly enlarged since prior CT examination of the abdomen and pelvis of 09/12/2024. Extensive multivessel coronary artery calcifications. Mild atherosclerotic calcification within the thoracic aorta. No aortic aneurysm. LYMPH NODES: No mediastinal, hilar or axillary lymphadenopathy. LUNGS AND PLEURA: There is partial collapse of the lower lobes bilaterally and volume loss with superimposed pulmonary hypoinflation. Vascular crowding at the hila. No pneumothorax. No pleural effusion. UPPER ABDOMEN: Dilated gas and fluid-filled loops of bowel within the visualized upper abdomen as well as the third portion of the duodenum suggest a postoperative adynamic ileus or distal bowel obstruction given the relatively decompressed nature of the colon. Nasogastric tube tip is seen within the mid body of the stomach. Small hiatal hernia. The esophagus is unremarkable. SOFT TISSUES AND BONES: No acute bone abnormality. Right internal jugular hemodialysis catheter tip is seen from the region of the hepatic caval junction. Withdrawal of the catheter by roughly 4 cm with more centrally positioned the tip within the right atrium. IMPRESSION: 1. No evidence of pulmonary embolism. 2. Partial collapse of the lower lobes bilaterally and volume loss with superimposed pulmonary hypoinflation. 3. Small relatively high density pericardial effusion, slightly enlarged since prior CT examination of the abdomen and pelvis of 09/12/2024. 4. Dilated gas and fluid-filled loops of bowel within the visualized upper abdomen and third portion of the duodenum, suggestive of postoperative adynamic ileus or distal bowel obstruction given the relatively decompressed nature  of the colon. 5. Right internal jugular hemodialysis catheter tip is seen from the region of the hepatic caval junction. Withdrawal of the catheter by roughly 4 cm will more centrally position the tip within the right atrium 6. Extensive coronary artery calcifications. Cardiomegaly. Electronically signed by: Dorethia Molt MD 09/15/2024 01:49 AM EDT RP Workstation: [id number]   DG Abd 1 View Result Date: 09/14/2024 EXAM: 1 VIEW XRAY OF THE ABDOMEN 09/14/2024 11:16:22 PM COMPARISON: None available. CLINICAL HISTORY: Encounter for nasogastric (NG) tube placement. FINDINGS: LINES, TUBES AND DEVICES: Nasogastric tube tip overlies the expected distal esophagus. Right internal jugular hemodialysis catheter tip seen within the right atrium. BOWEL: Dilated loops of bowel were seen within the upper abdomen in keeping with an underlying bowel obstruction. SOFT TISSUES: No opaque urinary calculi. BONES: No acute osseous abnormality. IMPRESSION: 1. Dilated loops of bowel in the upper abdomen, consistent with bowel obstruction. 2. Nasogastric tube tip overlies the expected distal esophagus. Electronically signed by: Dorethia Molt MD 09/14/2024 11:20 PM EDT RP Workstation: [id number]   DG Chest 1 View Result Date: 09/14/2024 EXAM: 1 VIEW XRAY OF THE CHEST 09/14/2024 08:48:00 PM COMPARISON: 09/12/2024 CLINICAL HISTORY: Abdominal distention, emesis, and SOB. Hx of asthma, diabetes, HTN, and abdominal surgery. FINDINGS: LINES, TUBES AND DEVICES: Stable right IJ hemodialysis catheter, tip within the right atrium. LUNGS AND PLEURA: Extremely low lung volumes with bibasilar subsegmental atelectasis. No focal pulmonary opacity. No pulmonary edema. No pleural effusion. No pneumothorax. HEART AND MEDIASTINUM: Aortic atherosclerosis. No acute abnormality of the cardiac and mediastinal silhouettes. BONES AND SOFT TISSUES: No acute osseous abnormality. IMPRESSION: 1. Extremely low lung volumes with bibasilar subsegmental atelectasis. 2.  Gaseous distention of stomach. Electronically signed by: Dorethia Molt MD 09/14/2024 09:00 PM EDT RP Workstation: [id number]  DG Abd 1 View Result Date: 09/14/2024 EXAM: 1 VIEW XRAY OF THE ABDOMEN 09/14/2024 08:48:00 PM COMPARISON: None available. CLINICAL HISTORY: Abdominal distention, emesis, and SOB. Hx of asthma, diabetes, HTN, and abdominal surgery. FINDINGS: LINES, TUBES AND DEVICES: Hemodialysis catheter in place with tip overlying the deep right atrium. BOWEL: Dilated small bowel loops consistent with postoperative ileus or obstruction. SOFT TISSUES: Calcified fibroid in the pelvis. BONES: No acute osseous abnormality. IMPRESSION: 1. Dilated small bowel loops consistent with postoperative ileus or obstruction. Electronically signed by: Dorethia Molt MD 09/14/2024 08:59 PM EDT RP Workstation: [id number]     LOS: 3 days   Amaryllis Dare, MD  [facility]   Pager on [email or web address]. If 7PM-7AM, please contact night-coverage at [email or web address]   09/16/2024, 1:54 PM

## 2024-09-16 NOTE — Progress Notes (Signed)
 [facility]  ROUNDING NOTE   Subjective:   Brianna [name] is a 63 year old female with past medical conditions including obstructive sleep apnea on CPAP, gout, GERD, type 2 diabetes, and end-stage renal disease on hemodialysis.  Patient presented to the hospital due to incarcerated and obstructed ventral hernia, experienced exploratory laparotomy on 9/13.  Patient is currently admitted for SBO (small bowel obstruction) (HCC) [K56.609] Hernia of abdominal wall [K43.9] Incarcerated ventral hernia [K43.6]  Postoperatively patient found to be tachycardic and requiring supplemental oxygen, 3 L.  Patient is known to our practice and receives outpatient dialysis treatments at [facility] on a MWF schedule, supervised by Dr. Marcelino.    Update: Patient seen laying in bed Denies any acute complaints. NG tube in place.    Objective:  Vital signs in last 24 hours:  Temp:  [97.6 F (36.4 C)-98.7 F (37.1 C)] 98.4 F (36.9 C) (09/17 0800) Pulse Rate:  [95-101] 95 (09/17 1112) Resp:  [7-34] 24 (09/17 1112) BP: (72-164)/(20-84) 127/57 (09/17 1112) SpO2:  [90 %-100 %] 98 % (09/17 1112) Weight:  [82.4 kg-82.5 kg] 82.5 kg (09/17 0940)  Weight change: 0.2 kg Filed Weights   09/15/24 1019 09/15/24 1304 09/16/24 0940  Weight: 83.4 kg 82.4 kg 82.5 kg    Intake/Output: I/O last 3 completed shifts: In: 2217.1 [IV Piggyback:2217.1] Out: 2400 [Emesis/NG output:1400; Other:1000]   Intake/Output this shift:  Total I/O In: -  Out: 250 [Emesis/NG output:250]  Physical Exam: General: NAD  Head: Normocephalic, atraumatic. Moist oral mucosal membranes  Eyes: Anicteric  Neck: Supple  Lungs:  [place] O2, decreased breath sounds at bases  Heart: Regular rate and rhythm  Abdomen:  Soft, tender, NPWV in place, abdominal binder  Extremities: No peripheral edema.  Neurologic: Awake, alert, conversant  Skin: Warm,dry, no rash  Access: Left upper aVF, right IJ PermCath    Basic  Metabolic Panel: Recent Labs  Lab 09/13/24 0549 09/14/24 0429 09/14/24 1915 09/15/24 0311 09/16/24 0327  NA 142 143 140 140 139  K 4.4 5.0 4.6 4.5 4.5  CL 94* 99 96* 95* 93*  CO2 20* 22 17* 20* 21*  GLUCOSE 238* 171* 202* 175* 107*  BUN 57* 73* 87* 99* 73*  CREATININE 8.81* 10.45* 9.65* 10.15* 7.52*  CALCIUM 9.6 9.9 10.2 9.8 9.2  MG  --   --   --   --  2.1  PHOS  --   --   --  8.2* 7.0*    Liver Function Tests: Recent Labs  Lab 09/12/24 1456 09/15/24 0311 09/16/24 0327  AST 23 50*  --   ALT 15 <5  --   ALKPHOS 114 106  --   BILITOT 0.8 0.9  --   PROT 8.7* 7.2  --   ALBUMIN  4.3 3.1* 3.0*   Recent Labs  Lab 09/12/24 1456  LIPASE 32   No results for input(s): AMMONIA in the last 168 hours.  CBC: Recent Labs  Lab 09/12/24 1456 09/13/24 0549 09/14/24 0429 09/15/24 0311 09/16/24 0327  WBC 8.2 20.0* 12.9* 11.5* 7.9  NEUTROABS  --   --  10.2* 9.0*  --   HGB 13.2 12.8 11.9* 10.1* 9.7*  HCT 41.7 40.2 37.7 30.9* 30.0*  MCV 94.1 93.9 93.8 92.2 92.6  PLT 263 332 252 243 198    Cardiac Enzymes: No results for input(s): CKTOTAL, CKMB, CKMBINDEX, TROPONINI in the last 168 hours.  BNP: Invalid input(s): POCBNP  CBG: Recent Labs  Lab 09/15/24 1926 09/15/24 2310 09/16/24  9662 09/16/24 0743 09/16/24 1127  GLUCAP 101* 97 108* 110* 122*    Microbiology: Results for orders placed or performed during the hospital encounter of 09/12/24  Culture, blood (x 2)     Status: None (Preliminary result)   Collection Time: 09/15/24  3:11 AM   Specimen: BLOOD RIGHT HAND  Result Value Ref Range Status   Specimen Description BLOOD RIGHT HAND  Final   Special Requests   Final    BOTTLES DRAWN AEROBIC ONLY Blood Culture adequate volume   Culture   Final    NO GROWTH 1 DAY Performed at [facility], [address], [place], [place] [address]    Report Status PENDING  Incomplete  Culture, blood (x 2)     Status: None (Preliminary result)    Collection Time: 09/15/24  3:11 AM   Specimen: BLOOD RIGHT ARM  Result Value Ref Range Status   Specimen Description BLOOD RIGHT ARM  Final   Special Requests   Final    BOTTLES DRAWN AEROBIC ONLY Blood Culture adequate volume   Culture   Final    NO GROWTH 1 DAY Performed at [facility], [address] [place], [place] [address]    Report Status PENDING  Incomplete  MRSA Next Gen by PCR, Nasal     Status: None   Collection Time: 09/15/24  4:16 AM   Specimen: Nasal Mucosa; Nasal Swab  Result Value Ref Range Status   MRSA by PCR Next Gen NOT DETECTED NOT DETECTED Final    Comment: (NOTE) The GeneXpert MRSA Assay (FDA approved for NASAL specimens only), is one component of a comprehensive MRSA colonization surveillance program. It is not intended to diagnose MRSA infection nor to guide or monitor treatment for MRSA infections. Test performance is not FDA approved in patients less than 43 years old. Performed at [facility], [address] [place], [place] [address]     Coagulation Studies: Recent Labs    09/15/24 0311  LABPROT 24.2*  INR 2.1*    Urinalysis: No results for input(s): COLORURINE, LABSPEC, PHURINE, GLUCOSEU, HGBUR, BILIRUBINUR, KETONESUR, PROTEINUR, UROBILINOGEN, NITRITE, LEUKOCYTESUR in the last 72 hours.  Invalid input(s): APPERANCEUR    Imaging: CT ABDOMEN PELVIS W CONTRAST Result Date: 09/15/2024 CLINICAL DATA:  Status post reduction and repair large incarcerated and obstructed ventral hernia on 09/13/2024. Postoperative tachycardia, hypotension and abdominal pain. EXAM: CT ABDOMEN AND PELVIS WITH CONTRAST TECHNIQUE: Multidetector CT imaging of the abdomen and pelvis was performed using the standard protocol following bolus administration of intravenous contrast. RADIATION DOSE REDUCTION: This exam was performed according to the departmental dose-optimization program which includes automated exposure control,  adjustment of the mA and/or kV according to patient size and/or use of iterative reconstruction technique. CONTRAST:  OMNIPAQUE  IOHEXOL  300 MG/ML  SOLN COMPARISON:  09/12/2024 FINDINGS: Lower chest: Bilateral lower lobe atelectasis. Hepatobiliary: Normal appearance of liver. No masses or biliary dilatation. The gallbladder contains some vicariously excreted contrast. Pancreas: Unremarkable. No pancreatic ductal dilatation or surrounding inflammatory changes. Spleen: Normal in size without focal abnormality. Adrenals/Urinary Tract: Stable hyperplastic appearance of both adrenal glands without focal mass. Both kidneys have a normal appearance without hydronephrosis, calculi or lesion. The bladder is completely decompressed. Stomach/Bowel: Nasogastric tube extends to the mid stomach. Dilated proximal small bowel measuring up to 5 cm in maximum caliber. The dilated jejunum is hyperemic and fluid-filled. There does appear to be transition from dilated to nondilated small bowel somewhere in the anterior pelvis. Adhesion cannot be  excluded. The colon is decompressed. There is no evidence of recurrent hernia after recent surgical repair with expected small amount of seroma fluid and air within the previous large ventral hernia sac in pannicular fat. No free intraperitoneal air or focal abscess identified. Vascular/Lymphatic: Stable aortoiliac atherosclerosis without aneurysm. No lymphadenopathy identified. Reproductive: Stable uterine enlargement with a large dominant heavily calcified degenerated fibroid. Other: No ascites. Musculoskeletal: No acute or significant osseous findings. IMPRESSION: 1. Dilated proximal small bowel measuring up to 5 cm in maximum caliber. The dilated jejunum is hyperemic and fluid-filled. There does appear to be transition from dilated to nondilated small bowel somewhere in the anterior pelvis. Small bowel obstruction due to adhesion cannot be excluded. 2. No evidence of recurrent hernia  after recent surgical repair with expected small amount of seroma fluid and air within the previous large ventral hernia sac in pannicular fat. 3. Stable uterine enlargement with a large dominant heavily calcified degenerated fibroid. 4. Stable hyperplastic appearance of both adrenal glands without focal mass. Electronically Signed   By: Marcey Moan M.D.   On: 09/15/2024 15:50   CT Angio Chest Pulmonary Embolism (PE) W or WO Contrast Result Date: 09/15/2024 EXAM: CTA of the Chest with contrast for PE 09/15/2024 01:32:05 AM TECHNIQUE: CTA of the chest was performed after the administration of intravenous contrast. Multiplanar reformatted images are provided for review. MIP images are provided for review. Automated exposure control, iterative reconstruction, and/or weight based adjustment of the mA/kV was utilized to reduce the radiation dose to as low as reasonably achievable. COMPARISON: None available. CLINICAL HISTORY: Tachypnea and tachycardia post-op. Acute onset of shortness of breath. FINDINGS: PULMONARY ARTERIES: The central pulmonary arteries are of normal caliber. No intraluminal filling defects identified to suggest acute pulmonary embolism. MEDIASTINUM: Mild cardiomegaly with left ventricular hypertrophy. Small relatively high density pericardial effusion is present suggesting hemorrhagic or proteinaceous fluid, slightly enlarged since prior CT examination of the abdomen and pelvis of 09/12/2024. Extensive multivessel coronary artery calcifications. Mild atherosclerotic calcification within the thoracic aorta. No aortic aneurysm. LYMPH NODES: No mediastinal, hilar or axillary lymphadenopathy. LUNGS AND PLEURA: There is partial collapse of the lower lobes bilaterally and volume loss with superimposed pulmonary hypoinflation. Vascular crowding at the hila. No pneumothorax. No pleural effusion. UPPER ABDOMEN: Dilated gas and fluid-filled loops of bowel within the visualized upper abdomen as well as  the third portion of the duodenum suggest a postoperative adynamic ileus or distal bowel obstruction given the relatively decompressed nature of the colon. Nasogastric tube tip is seen within the mid body of the stomach. Small hiatal hernia. The esophagus is unremarkable. SOFT TISSUES AND BONES: No acute bone abnormality. Right internal jugular hemodialysis catheter tip is seen from the region of the hepatic caval junction. Withdrawal of the catheter by roughly 4 cm with more centrally positioned the tip within the right atrium. IMPRESSION: 1. No evidence of pulmonary embolism. 2. Partial collapse of the lower lobes bilaterally and volume loss with superimposed pulmonary hypoinflation. 3. Small relatively high density pericardial effusion, slightly enlarged since prior CT examination of the abdomen and pelvis of 09/12/2024. 4. Dilated gas and fluid-filled loops of bowel within the visualized upper abdomen and third portion of the duodenum, suggestive of postoperative adynamic ileus or distal bowel obstruction given the relatively decompressed nature of the colon. 5. Right internal jugular hemodialysis catheter tip is seen from the region of the hepatic caval junction. Withdrawal of the catheter by roughly 4 cm will more centrally position the tip within the  right atrium 6. Extensive coronary artery calcifications. Cardiomegaly. Electronically signed by: Dorethia Molt MD 09/15/2024 01:49 AM EDT RP Workstation: [id number]   DG Abd 1 View Result Date: 09/14/2024 EXAM: 1 VIEW XRAY OF THE ABDOMEN 09/14/2024 11:16:22 PM COMPARISON: None available. CLINICAL HISTORY: Encounter for nasogastric (NG) tube placement. FINDINGS: LINES, TUBES AND DEVICES: Nasogastric tube tip overlies the expected distal esophagus. Right internal jugular hemodialysis catheter tip seen within the right atrium. BOWEL: Dilated loops of bowel were seen within the upper abdomen in keeping with an underlying bowel obstruction. SOFT TISSUES: No opaque  urinary calculi. BONES: No acute osseous abnormality. IMPRESSION: 1. Dilated loops of bowel in the upper abdomen, consistent with bowel obstruction. 2. Nasogastric tube tip overlies the expected distal esophagus. Electronically signed by: Dorethia Molt MD 09/14/2024 11:20 PM EDT RP Workstation: [id number]   DG Chest 1 View Result Date: 09/14/2024 EXAM: 1 VIEW XRAY OF THE CHEST 09/14/2024 08:48:00 PM COMPARISON: 09/12/2024 CLINICAL HISTORY: Abdominal distention, emesis, and SOB. Hx of asthma, diabetes, HTN, and abdominal surgery. FINDINGS: LINES, TUBES AND DEVICES: Stable right IJ hemodialysis catheter, tip within the right atrium. LUNGS AND PLEURA: Extremely low lung volumes with bibasilar subsegmental atelectasis. No focal pulmonary opacity. No pulmonary edema. No pleural effusion. No pneumothorax. HEART AND MEDIASTINUM: Aortic atherosclerosis. No acute abnormality of the cardiac and mediastinal silhouettes. BONES AND SOFT TISSUES: No acute osseous abnormality. IMPRESSION: 1. Extremely low lung volumes with bibasilar subsegmental atelectasis. 2. Gaseous distention of stomach. Electronically signed by: Dorethia Molt MD 09/14/2024 09:00 PM EDT RP Workstation: [id number]   DG Abd 1 View Result Date: 09/14/2024 EXAM: 1 VIEW XRAY OF THE ABDOMEN 09/14/2024 08:48:00 PM COMPARISON: None available. CLINICAL HISTORY: Abdominal distention, emesis, and SOB. Hx of asthma, diabetes, HTN, and abdominal surgery. FINDINGS: LINES, TUBES AND DEVICES: Hemodialysis catheter in place with tip overlying the deep right atrium. BOWEL: Dilated small bowel loops consistent with postoperative ileus or obstruction. SOFT TISSUES: Calcified fibroid in the pelvis. BONES: No acute osseous abnormality. IMPRESSION: 1. Dilated small bowel loops consistent with postoperative ileus or obstruction. Electronically signed by: Dorethia Molt MD 09/14/2024 08:59 PM EDT RP Workstation: [id number]     Medications:    cefTRIAXone  (ROCEPHIN )  IV  Stopped (09/16/24 0544)   metronidazole  100 mL/hr at 09/16/24 0552   vancomycin       allopurinol   100 mg Per Tube Daily   [START ON 09/17/2024] Chlorhexidine  Gluconate Cloth  6 each Topical QHS   ezetimibe   10 mg Per Tube Daily   heparin   5,000 Units Subcutaneous Q8H   hydrOXYzine   50 mg Per Tube Daily   insulin  aspart  0-9 Units Subcutaneous Q4H   pantoprazole  (PROTONIX ) IV  40 mg Intravenous Q24H   sertraline   25 mg Per Tube Daily   sevelamer  carbonate  0.8 g Per Tube TID WC   albuterol , ALPRAZolam , HYDROmorphone  (DILAUDID ) injection, methocarbamol  (ROBAXIN ) injection, ondansetron  (ZOFRAN ) IV, ondansetron , zolpidem   Assessment/ Plan:  Ms. Mahiya Kercheval is a 63 y.o.  female with past medical conditions including obstructive sleep apnea on CPAP, gout, GERD, type 2 diabetes, and end-stage renal disease on hemodialysis.  Patient presented to the hospital due to incarcerated and obstructed ventral hernia, experienced exploratory laparotomy on 9/13.  Patient is currently admitted for SBO (small bowel obstruction) (HCC) [K56.609] Hernia of abdominal wall [K43.9] Incarcerated ventral hernia [K43.6]  [facility]/MWF/right IJ PermCath/left upper AVF.   End-stage renal disease on hemodialysis.  Continue MWF schedule. Plan for hemodialysis today. UF goal 0.5 kg.  2. Acute respiratory failure, placed on Bipap in ICU.  Now has been weaned off to nasal cannula.  Currently on 2 L/min oxygen supplementation.  3. Anemia of chronic kidney disease Lab Results  Component Value Date   HGB 9.7 (L) 09/16/2024    Hemoglobin acceptable.  Will continue to monitor for now.  4.  Incarcerated ventral hernia status post exploratory laparotomy on 9/13.  Wound VAC placed.  General surgery continues to follow and offer supportive measures and pain management.  5. Secondary Hyperparathyroidism: with outpatient labs: PTH 993, phosphorus 4.9, calcium 9.5 on 09/07/24.   Lab Results  Component Value  Date   PTH 651 (H) 05/24/2023   CALCIUM 9.2 09/16/2024   CAION 1.11 (L) 08/08/2023   PHOS 7.0 (H) 09/16/2024    Phos level is elevated.  Sevelamer  tablets were changed to powder-but we will hold for now until acute GI issues are resolved.   LOS: 3 Kennard Fildes 9/17/202511:51 AM

## 2024-09-16 NOTE — Evaluation (Signed)
 Occupational Therapy Evaluation Patient Details Name: Brianna [name] MRN: [id number] DOB: 1961-12-21 Today's Date: 09/16/2024   History of Present Illness   Pt is a 63 year old female she underwent exploratory laparotomy with Dr. Marinda in general surgery on 09/12/24 for an incarcerated ventral hernia. Her post-op hospital course was complicated by persistent tachycardia, respiratory distress requiring BiPAP support, hypotension and her ESRD. She remains on BiPAP this morning and BP is overall improving without vasopressor support.    PMH significant for ESRD on dialysis, HFpEF, DM type II, and OSA on CPAP     Clinical Impressions Chart reviewed to date, pt greeted semi supine in bed, agreeable to OT evaluation. Her cognition appears altered compared to baseline, she is oriented to self and place and requires increased time for one step direction following. PTA she is MOD I with ADL/IADL (does have help for meal prep at times) and amb with a rollator. She requires physical assistance for all mobility/ADL, please see further details below. Abdominal binder donned on patient prior to OT session starting and remains in place throughout. Wound vac and NG intact pre/post session. She does report SOB with mobility attempts, spo2 >90% on 2L via [place] throughout. Pt is left as received, all needs met. OT will follow acutely to facilitate optimal ADL/functional mobility performance.      If plan is discharge home, recommend the following:   A lot of help with walking and/or transfers;A lot of help with bathing/dressing/bathroom     Functional Status Assessment   Patient has had a recent decline in their functional status and demonstrates the ability to make significant improvements in function in a reasonable and predictable amount of time.     Equipment Recommendations   BSC/3in1     Recommendations for Other Services         Precautions/Restrictions   Precautions Precautions:  Fall Recall of Precautions/Restrictions: Impaired Restrictions Weight Bearing Restrictions Per Provider Order: No     Mobility Bed Mobility Overal bed mobility: Needs Assistance Bed Mobility: Rolling, Supine to Sit, Sit to Supine Rolling: Mod assist   Supine to sit: Min assist, Mod assist, +2 for physical assistance Sit to supine: Min assist, Mod assist, +2 for physical assistance        Transfers Overall transfer level: Needs assistance Equipment used: Rolling walker (2 wheels) Transfers: Sit to/from Stand Sit to Stand: Contact guard assist, Min assist, +2 physical assistance, From elevated surface                  Balance Overall balance assessment: Needs assistance Sitting-balance support: Bilateral upper extremity supported, Feet supported Sitting balance-Leahy Scale: Fair     Standing balance support: Bilateral upper extremity supported, During functional activity, Reliant on assistive device for balance Standing balance-Leahy Scale: Poor                             ADL either performed or assessed with clinical judgement   ADL Overall ADL's : Needs assistance/impaired Eating/Feeding: NPO               Upper Body Dressing : Maximal assistance   Lower Body Dressing: Maximal assistance Lower Body Dressing Details (indicate cue type and reason): donn/doff shoes     Toileting- Clothing Manipulation and Hygiene: Maximal assistance;Total assistance;Bed level Toileting - Clothing Manipulation Details (indicate cue type and reason): BM             Vision  Patient Visual Report: No change from baseline Additional Comments: will continue to assess     Perception         Praxis         Pertinent Vitals/Pain Pain Assessment Pain Assessment: 0-10 Pain Score: 5  Pain Location: stomach pre and post mobility Pain Descriptors / Indicators: Discomfort Pain Intervention(s): Monitored during session, Repositioned     Extremity/Trunk  Assessment Upper Extremity Assessment Upper Extremity Assessment: Generalized weakness   Lower Extremity Assessment Lower Extremity Assessment: Generalized weakness   Cervical / Trunk Assessment Cervical / Trunk Assessment: Kyphotic   Communication Communication Communication: No apparent difficulties   Cognition Arousal: Alert Behavior During Therapy: WFL for tasks assessed/performed, Flat affect Cognition: Cognition impaired   Orientation impairments: date,Situation   Memory impairment (select all impairments): Declarative long-term memory Attention impairment (select first level of impairment): Selective attention Executive functioning impairment (select all impairments): Problem solving, Reasoning                   Following commands: Impaired Following commands impaired: Follows one step commands with increased time     Cueing  General Comments   Cueing Techniques: Verbal cues;Tactile cues  spo2 >90% on 2 L via [place], reports SOB with mobility attempts; HR to 100s bpm during mobiltiy attempts;   Exercises Other Exercises Other Exercises: edu re role of OT, role of rehab, discharge recommendations   Shoulder Instructions      Home Living Family/patient expects to be discharged to:: Private residence Living Arrangements: Other (Comment) (roommate) Available Help at Discharge: Available PRN/intermittently Type of Home: House Home Access: Stairs to enter [employer] of Steps: 2 Entrance Stairs-Rails: None Home Layout: One level     Bathroom Shower/Tub: Chief Strategy Officer: Standard Bathroom Accessibility: Yes   Home Equipment: Rollator (4 wheels)          Prior Functioning/Environment Prior Level of Function : Needs assist             Mobility Comments: amb with rollator home/community ADLs Comments: MOD I for dressing, bathing, grooming, toileting; PRN assist from roommate for meal prep    OT Problem List:  Decreased strength;Impaired balance (sitting and/or standing);Decreased cognition;Decreased activity tolerance;Decreased knowledge of use of DME or AE   OT Treatment/Interventions: Self-care/ADL training;Therapeutic exercise;Patient/family education;Balance training;Energy conservation;Therapeutic activities;DME and/or AE instruction      OT Goals(Current goals can be found in the care plan section)   Acute Rehab OT Goals Patient Stated Goal: improve function OT Goal Formulation: With patient Time For Goal Achievement: 09/30/24 Potential to Achieve Goals: Good ADL Goals Pt Will Perform Grooming: with modified independence;sitting Pt Will Perform Lower Body Dressing: with modified independence;sitting/lateral leans;sit to/from stand Pt Will Transfer to Toilet: with modified independence;ambulating Pt Will Perform Toileting - Clothing Manipulation and hygiene: with modified independence;sitting/lateral leans;sit to/from stand   OT Frequency:  Min 2X/week    Co-evaluation PT/OT/SLP Co-Evaluation/Treatment: Yes Reason for Co-Treatment: Complexity of the patient's impairments (multi-system involvement);For patient/therapist safety PT goals addressed during session: Mobility/safety with mobility OT goals addressed during session: ADL's and self-care      AM-PAC OT 6 Clicks Daily Activity     Outcome Measure Help from another person eating meals?: Total Help from another person taking care of personal grooming?: A Little Help from another person toileting, which includes using toliet, bedpan, or urinal?: A Lot Help from another person bathing (including washing, rinsing, drying)?: A Lot Help from another person to put on and  taking off regular upper body clothing?: A Little Help from another person to put on and taking off regular lower body clothing?: A Lot 6 Click Score: 13   End of Session Equipment Utilized During Treatment: Rolling walker (2 wheels);Oxygen Nurse Communication:  Mobility status  Activity Tolerance: Patient limited by fatigue;Patient limited by pain Patient left: in bed;with call bell/phone within reach;with bed alarm set  OT Visit Diagnosis: Other abnormalities of gait and mobility (R26.89);Muscle weakness (generalized) (M62.81)                Time: 8971-8940 OT Time Calculation (min): 31 min Charges:  OT General Charges $OT Visit: 1 Visit OT Evaluation $OT Eval High Complexity: 1 High  Therisa Sheffield, OTD OTR/L  09/16/24, 12:47 PM

## 2024-09-16 NOTE — Progress Notes (Addendum)
 Hemodialysis Note:  Received patient in bed to unit. Alert and oriented. Informed consent singed and in chart.  Treatment initiated: 1430 Treatment completed: 1610  Access used: Right internal jugular catheter Access issues: None  Patient requested to end treatment 1 hour and 30 minutes early due to abdominal pain. Pain meds was offered but she refused it and said she wanted to take the pain meds when she get back to her room. Patient signed AMA form. Nephrologist made aware.Transported back to room, alert without acute distress. Report given to patient's RN.  Total UF removed: 0 Medications given: Albumin  25 gm IV  Post HD weight: 81.7 Kg  [facility]

## 2024-09-16 NOTE — TOC Progression Note (Signed)
 Transition of Care [facility]) - Progression Note    Patient Details  Name: Brianna [name] MRN: [id number] Date of Birth: [birth year]  Transition of Care [facility]) CM/SW Contact  Delphine KANDICE Bring, RN Phone Number: 09/16/2024, 10:56 AM  Clinical Narrative:    Patient has NGT to LIS  and oxygen via nasal cannula. Patient states she goes to HD [employer] [facility] [employer] [facility] MWF at 5:30am. She use transportation. Patient denies services in the home prior to admission. Patient states she lives with a roommate who is not there often. CM asked who will assist her at home. She was not sure. CM asked about family but she states that they work.   Therapy ordered.   Patient states that current DME use at home is a rolliator, CPAP, and nebulizer.                      Expected Discharge Plan and Services                                               Social Drivers of Health (SDOH) Interventions SDOH Screenings   Food Insecurity: No Food Insecurity (09/13/2024)  Housing: Unknown (09/13/2024)  Transportation Needs: Unknown (09/13/2024)  Utilities: Not At Risk (09/13/2024)  Depression (PHQ2-9): Low Risk  (09/08/2024)  Financial Resource Strain: High Risk (06/04/2024)   Received from [facility]  Tobacco Use: Low Risk  (09/12/2024)    Readmission Risk Interventions     No data to display

## 2024-09-16 NOTE — Evaluation (Signed)
 Physical Therapy Evaluation Patient Details Name: Brianna [name] MRN: [id number] DOB: [birth year] Today's Date: 09/16/2024  History of Present Illness  Brianna [name] is a 63 year old female presented to the hospital due to incarcerated and obstructed ventral hernia, experienced exploratory laparotomy on 9/13.  Clinical Impression  Co-treat with OT this session for therapist safety. Pt is a pleasant 63 year old female who was admitted for incarcerated and obstructed ventral hernia. She is POD 4 after laparotomy. Pt diastolic BP low during therapy session, however pt was not symptomatic. Prior to hospitalization, pt used rollator for household and short community distances. She was able to perform ADLs with MOD I and intermittent assistance from her roommate for meal prep.  Pt able to roll with mod A for PT/OT to assist with getting onto the bed pan and for peri care. Pt able to reach for bed rail to assist. Pt required MIN-MOD A x 2 for supine<>sit for LE and trunk management. Pt able to stand from elevated bed with CGA-MIN A x 2. Unable to assess ambulation d/t weakness. Pt endorses feelings of SOB, however her SpO2 remained WNL throughout session. Would benefit from skilled PT to address above deficits and promote optimal return to PLOF.          If plan is discharge home, recommend the following: A lot of help with bathing/dressing/bathroom;Two people to help with walking and/or transfers;Assist for transportation;Help with stairs or ramp for entrance   Can travel by private vehicle   No    Equipment Recommendations Rolling walker (2 wheels)  Recommendations for Other Services       Functional Status Assessment Patient has had a recent decline in their functional status and demonstrates the ability to make significant improvements in function in a reasonable and predictable amount of time.     Precautions / Restrictions Precautions Precautions: Fall Recall of  Precautions/Restrictions: Intact Restrictions Weight Bearing Restrictions Per Provider Order: No      Mobility  Bed Mobility Overal bed mobility: Needs Assistance Bed Mobility: Rolling, Supine to Sit, Sit to Supine Rolling: Mod assist   Supine to sit: Min assist, Mod assist, +2 for physical assistance Sit to supine: Min assist, Mod assist, +2 for physical assistance   General bed mobility comments: Assistance with trunk and LE management in and out of bed. Able to reach for hand rails with tactile cue to assist.    Transfers Overall transfer level: Needs assistance Equipment used: Rolling walker (2 wheels) Transfers: Sit to/from Stand Sit to Stand: Contact guard assist, Min assist, +2 physical assistance, From elevated surface           General transfer comment: Able to stand from elevated ICU bed    Ambulation/Gait               General Gait Details: Unable to assess d/t weakness  Stairs            Wheelchair Mobility     Tilt Bed    Modified Rankin (Stroke Patients Only)       Balance Overall balance assessment: Needs assistance Sitting-balance support: Bilateral upper extremity supported, Feet supported Sitting balance-Leahy Scale: Fair Sitting balance - Comments: Pt able to maintain seated balance at EOB   Standing balance support: Bilateral upper extremity supported, During functional activity, Reliant on assistive device for balance Standing balance-Leahy Scale: Poor Standing balance comment: Verbal cuing to remain standing. Fatigues quickly. Noted increase in RR with prolonged stand. Once sitting, verbal cues for PLB  Pertinent Vitals/Pain Pain Assessment Pain Assessment: Faces Faces Pain Scale: Hurts a little bit    Home Living Family/patient expects to be discharged to:: Private residence Living Arrangements: Other (Comment) (roommate) Available Help at Discharge: Available  PRN/intermittently Type of Home: House Home Access: Stairs to enter Entrance Stairs-Rails: None Entrance Stairs-Number of Steps: 2   Home Layout: One level Home Equipment: Rollator (4 wheels)      Prior Function Prior Level of Function : Needs assist             Mobility Comments: amb with rollator home/community ADLs Comments: MOD I for dressing, bathing, grooming, toileting; PRN assist from roommate for meal prep     Extremity/Trunk Assessment   Upper Extremity Assessment Upper Extremity Assessment: Generalized weakness    Lower Extremity Assessment Lower Extremity Assessment: Generalized weakness    Cervical / Trunk Assessment Cervical / Trunk Assessment: Kyphotic  Communication   Communication Communication: No apparent difficulties    Cognition Arousal: Alert Behavior During Therapy: WFL for tasks assessed/performed   PT - Cognitive impairments: No apparent impairments                       PT - Cognition Comments: Pleasant and agreeable to PT session. Following commands: Impaired Following commands impaired: Follows one step commands with increased time     Cueing Cueing Techniques: Verbal cues, Tactile cues     General Comments General comments (skin integrity, edema, etc.): 2L of O2. SpO2 remained WNL thorughout session, however pt endorsed difficulty breathing during session    Exercises Other Exercises Other Exercises: assistance onto bed pan and with peri care. MAX A for peri care.   Assessment/Plan    PT Assessment Patient needs continued PT services  PT Problem List Decreased strength;Decreased activity tolerance;Decreased balance;Decreased mobility       PT Treatment Interventions DME instruction;Gait training;Stair training;Functional mobility training;Therapeutic activities;Therapeutic exercise;Balance training;Neuromuscular re-education;Patient/family education    PT Goals (Current goals can be found in the Care Plan  section)  Acute Rehab PT Goals Patient Stated Goal: to feel better PT Goal Formulation: With patient Time For Goal Achievement: 09/30/24 Potential to Achieve Goals: Fair    Frequency Min 2X/week     Co-evaluation PT/OT/SLP Co-Evaluation/Treatment: Yes Reason for Co-Treatment: Complexity of the patient's impairments (multi-system involvement);For patient/therapist safety PT goals addressed during session: Mobility/safety with mobility OT goals addressed during session: ADL's and self-care       AM-PAC PT 6 Clicks Mobility  Outcome Measure Help needed turning from your back to your side while in a flat bed without using bedrails?: A Little Help needed moving from lying on your back to sitting on the side of a flat bed without using bedrails?: A Lot Help needed moving to and from a bed to a chair (including a wheelchair)?: A Lot Help needed standing up from a chair using your arms (e.g., wheelchair or bedside chair)?: A Lot Help needed to walk in hospital room?: A Lot Help needed climbing 3-5 steps with a railing? : A Lot 6 Click Score: 13    End of Session Equipment Utilized During Treatment: Oxygen Activity Tolerance: Patient limited by fatigue Patient left: in bed;with call bell/phone within reach Nurse Communication: Mobility status PT Visit Diagnosis: Unsteadiness on feet (R26.81);Muscle weakness (generalized) (M62.81);History of falling (Z91.81);Difficulty in walking, not elsewhere classified (R26.2)    Time: 1030-1059 PT Time Calculation (min) (ACUTE ONLY): 29 min   Charges:  Tres Grzywacz, SPT   Sharvi Mooneyhan 09/16/2024, 12:32 PM

## 2024-09-16 NOTE — Progress Notes (Signed)
 [facility] SURGICAL PROGRESS NOTE  Hospital Day(s): 3.   Post op day(s): 4 Days Post-Op.   Interval History:  Patient seen and examined No acute issues overnight She has not required vasopressor support; BP better this morning Her respiratory status is improved; only on 2L [place] No fever She reports abdominal soreness No nausea or emesis Previous leukocytosis now resolved; WBC 7.9K this AM Hgb to 9.7 BMP consistent with known ESRD; HD yesterday  NGT remains in place; output 650 ccs in last 24 hours She has had 2 BMs reportedly Prevena to midline   Vital signs in last 24 hours: [min-max] current  Temp:  [97.6 F (36.4 C)-99.2 F (37.3 C)] 98.4 F (36.9 C) (09/17 0800) Pulse Rate:  [95-103] 96 (09/17 0900) Resp:  [7-34] 25 (09/17 0900) BP: (72-154)/(20-84) 113/49 (09/17 0900) SpO2:  [90 %-100 %] 99 % (09/17 0900) Weight:  [82.4 kg-83.4 kg] 82.4 kg (09/16 1304)     Height: 4' 9 (144.8 cm) Weight: 82.4 kg BMI (Calculated): 39.3   Intake/Output last 2 shifts:  09/16 0701 - 09/17 0700 In: 613.5 [IV Piggyback:613.5] Out: 1650 [Emesis/NG output:650]   Physical Exam:  Constitutional: alert, cooperative and no distress  HEENT: NGT in place Respiratory: She is on [place], no respiratory distress Cardiovascular: tachycardic and sinus rhythm  Gastrointestinal: soft, non-tender, and non-distended, no rebound/guarding. She does not appear overtly peritonitic.  Integumentary: Midline with Prevena in place; serosanguinous output   Labs:     Latest Ref Rng & Units 09/16/2024    3:27 AM 09/15/2024    3:11 AM 09/14/2024    4:29 AM  CBC  WBC 4.0 - 10.5 K/uL 7.9  11.5  12.9   Hemoglobin 12.0 - 15.0 g/dL 9.7  89.8  88.0   Hematocrit 36.0 - 46.0 % 30.0  30.9  37.7   Platelets 150 - 400 K/uL 198  243  252       Latest Ref Rng & Units 09/16/2024    3:27 AM 09/15/2024    3:11 AM 09/14/2024    7:15 PM  CMP  Glucose 70 - 99 mg/dL 892  824  797   BUN 8 - 23 mg/dL 73  99  87    Creatinine 0.44 - 1.00 mg/dL 2.47  89.84  0.34   Sodium 135 - 145 mmol/L 139  140  140   Potassium 3.5 - 5.1 mmol/L 4.5  4.5  4.6   Chloride 98 - 111 mmol/L 93  95  96   CO2 22 - 32 mmol/L 21  20  17    Calcium 8.9 - 10.3 mg/dL 9.2  9.8  89.7   Total Protein 6.5 - 8.1 g/dL  7.2    Total Bilirubin 0.0 - 1.2 mg/dL  0.9    Alkaline Phos 38 - 126 U/L  106    AST 15 - 41 U/L  50    ALT 0 - 44 U/L  <5      Imaging studies:   CT Abdomen/Pelvis (09/15/2024) personally reviewed with small bowel dilation consistent with likely post-op ileus, no evidence of bowel injury/abscess, and radiologist report reviewed below:  IMPRESSION: 1. Dilated proximal small bowel measuring up to 5 cm in maximum caliber. The dilated jejunum is hyperemic and fluid-filled. There does appear to be transition from dilated to nondilated small bowel somewhere in the anterior pelvis. Small bowel obstruction due to adhesion cannot be excluded. 2. No evidence of recurrent hernia after recent surgical repair with expected small  amount of seroma fluid and air within the previous large ventral hernia sac in pannicular fat. 3. Stable uterine enlargement with a large dominant heavily calcified degenerated fibroid. 4. Stable hyperplastic appearance of both adrenal glands without focal mass.  Assessment/Plan:  63 y.o. female now with improved hemodynamics 4 Days Post-Op s/p exploratory laparotomy, reduction of incarcerated ventral hernia, and placement of Prevena wound vac complicated by pertinent comorbidities including ESRD.    - Hemodynamics are improved this morning and work up to this point is reassuring without evidence of complication from surgical perspective. I do think it is reasonable to transfer to the floor. Appreciate PCCM willingness to assist yesterday  - Would continue NGT decompression for another 24 hours; output still stagnant appearing. She likely had ileus and would not push too fast given her fragility    - Given her reassuring work up, normal WBC, and no identified source of infection, I do think we can de-escalate antibiotics   - Monitor abdominal examination; on-going bowel function   - Monitor leukocytosis; resolved - Pain control prn; avoid nephrotoxic agents  - Antiemetics prn             - Okay to mobilize; continue abdominal binder - engage therapies              - Appreciate nephrology assistance with ESRD, HD  All of the above findings and recommendations were discussed with the patient, and the medical team, and all of patient's questions were answered to her expressed satisfaction.  -- [name], PA-C [facility] 09/16/2024, 10:02 AM M-F: 7am - 4pm

## 2024-09-17 ENCOUNTER — Inpatient Hospital Stay

## 2024-09-17 DIAGNOSIS — K439 Ventral hernia without obstruction or gangrene: Secondary | ICD-10-CM | POA: Diagnosis not present

## 2024-09-17 DIAGNOSIS — N186 End stage renal disease: Secondary | ICD-10-CM | POA: Diagnosis not present

## 2024-09-17 DIAGNOSIS — K56609 Unspecified intestinal obstruction, unspecified as to partial versus complete obstruction: Secondary | ICD-10-CM | POA: Diagnosis not present

## 2024-09-17 DIAGNOSIS — K436 Other and unspecified ventral hernia with obstruction, without gangrene: Secondary | ICD-10-CM | POA: Diagnosis not present

## 2024-09-17 LAB — RENAL FUNCTION PANEL
Albumin: 3.4 g/dL — ABNORMAL LOW (ref 3.5–5.0)
Anion gap: 20 — ABNORMAL HIGH (ref 5–15)
BUN: 74 mg/dL — ABNORMAL HIGH (ref 8–23)
CO2: 23 mmol/L (ref 22–32)
Calcium: 9.2 mg/dL (ref 8.9–10.3)
Chloride: 98 mmol/L (ref 98–111)
Creatinine, Ser: 7.04 mg/dL — ABNORMAL HIGH (ref 0.44–1.00)
GFR, Estimated: 6 mL/min — ABNORMAL LOW (ref >60)
Glucose, Bld: 98 mg/dL (ref 70–99)
Phosphorus: 8.2 mg/dL — ABNORMAL HIGH (ref 2.5–4.6)
Potassium: 4.4 mmol/L (ref 3.5–5.1)
Sodium: 141 mmol/L (ref 135–145)

## 2024-09-17 LAB — CBC
HCT: 28.3 % — ABNORMAL LOW (ref 36.0–46.0)
Hemoglobin: 8.9 g/dL — ABNORMAL LOW (ref 12.0–15.0)
MCH: 29.8 pg (ref 26.0–34.0)
MCHC: 31.4 g/dL (ref 30.0–36.0)
MCV: 94.6 fL (ref 80.0–100.0)
Platelets: 211 K/uL (ref 150–400)
RBC: 2.99 MIL/uL — ABNORMAL LOW (ref 3.87–5.11)
RDW: 15.9 % — ABNORMAL HIGH (ref 11.5–15.5)
WBC: 9.1 K/uL (ref 4.0–10.5)
nRBC: 0.7 % — ABNORMAL HIGH (ref 0.0–0.2)

## 2024-09-17 LAB — GLUCOSE, CAPILLARY
Glucose-Capillary: 93 mg/dL (ref 70–99)
Glucose-Capillary: 100 mg/dL — ABNORMAL HIGH (ref 70–99)
Glucose-Capillary: 92 mg/dL (ref 70–99)
Glucose-Capillary: 93 mg/dL (ref 70–99)
Glucose-Capillary: 89 mg/dL (ref 70–99)
Glucose-Capillary: 97 mg/dL (ref 70–99)
Glucose-Capillary: 96 mg/dL (ref 70–99)

## 2024-09-17 MED ORDER — MIDODRINE HCL 5 MG PO TABS
5.0000 mg | ORAL_TABLET | Freq: Three times a day (TID) | ORAL | Status: DC
  Administered 2024-09-17 – 2024-09-24 (×20): 5 mg via ORAL
  Filled 2024-09-17 (×18): qty 1

## 2024-09-17 MED ORDER — LACTATED RINGERS IV BOLUS
500.0000 mL | Freq: Once | INTRAVENOUS | Status: AC
  Administered 2024-09-17: 500 mL via INTRAVENOUS

## 2024-09-17 NOTE — Progress Notes (Signed)
 Patient arrived from ICU. Patient resting in bed. Per patient no needs at this time.  Vitals taken. Telemetry placed on the patient.

## 2024-09-17 NOTE — Progress Notes (Signed)
 [facility] SURGICAL PROGRESS NOTE  Hospital Day(s): 4.   Post op day(s): 5 Days Post-Op.   Interval History:  Patient seen and examined No acute issues overnight Her respiratory status remains improved; only on 2L [place] No fever She reports abdominal soreness; feeling gaseous  No nausea or emesis Previous leukocytosis remains resolved; WBC 9.1K this AM Hgb to 8.9 BMP consistent with known ESRD NGT remains in place; output 250 ccs in last 24 hours She has continued to have bowel function Prevena to midline  KUB this morning with improvement in bowel gas pattern   Vital signs in last 24 hours: [min-max] current  Temp:  [97.8 F (36.6 C)-98.7 F (37.1 C)] 98.6 F (37 C) (09/18 0730) Pulse Rate:  [82-103] 88 (09/18 0839) Resp:  [15-31] 18 (09/18 0839) BP: (85-164)/(17-87) 90/61 (09/18 0839) SpO2:  [90 %-99 %] 98 % (09/18 0839) Weight:  [80.7 kg-82.5 kg] 80.7 kg (09/18 0310)     Height: 4' 9 (144.8 cm) Weight: 80.7 kg BMI (Calculated): 38.49   Intake/Output last 2 shifts:  09/17 0701 - 09/18 0700 In: 39.6 [IV Piggyback:39.6] Out: 350 [Urine:100; Emesis/NG output:250]   Physical Exam:  Constitutional: alert, cooperative and no distress  HEENT: NGT in place Respiratory: She is on [place], no respiratory distress Cardiovascular: normal rate and sinus rhythm  Gastrointestinal: soft, non-tender, and non-distended, no rebound/guarding. She does not appear overtly peritonitic.  Integumentary: Midline with Prevena in place; serosanguinous output   Labs:     Latest Ref Rng & Units 09/17/2024    4:55 AM 09/16/2024    3:27 AM 09/15/2024    3:11 AM  CBC  WBC 4.0 - 10.5 K/uL 9.1  7.9  11.5   Hemoglobin 12.0 - 15.0 g/dL 8.9  9.7  89.8   Hematocrit 36.0 - 46.0 % 28.3  30.0  30.9   Platelets 150 - 400 K/uL 211  198  243       Latest Ref Rng & Units 09/17/2024    4:55 AM 09/16/2024    3:27 AM 09/15/2024    3:11 AM  CMP  Glucose 70 - 99 mg/dL 98  892  824   BUN 8 - 23  mg/dL 74  73  99   Creatinine 0.44 - 1.00 mg/dL 2.95  2.47  89.84   Sodium 135 - 145 mmol/L 141  139  140   Potassium 3.5 - 5.1 mmol/L 4.4  4.5  4.5   Chloride 98 - 111 mmol/L 98  93  95   CO2 22 - 32 mmol/L 23  21  20    Calcium 8.9 - 10.3 mg/dL 9.2  9.2  9.8   Total Protein 6.5 - 8.1 g/dL   7.2   Total Bilirubin 0.0 - 1.2 mg/dL   0.9   Alkaline Phos 38 - 126 U/L   106   AST 15 - 41 U/L   50   ALT 0 - 44 U/L   <5     Imaging studies:   KUB (09/17/2024) personally reviewed with improving bowel gas pattern, and radiologist report reviewed below:  IMPRESSION: 1. Improved bowel-gas pattern since 09/15/2024, non-obstructed now. 2. Stable enteric tube terminating in the proximal stomach. 3. Bulky calcified midline uterine fibroid.   Assessment/Plan:  63 y.o. female now with improved hemodynamics and resolving ileus 5 Days Post-Op s/p exploratory laparotomy, reduction of incarcerated ventral hernia, and placement of Prevena wound vac complicated by pertinent comorbidities including ESRD.    - Giving return  of bowel function, low NGT output, and reassuring KUB, We can do 4 hour gravity trial with NGT prior to removal. If residuals are <150 ccs, we can remove this and start CLD.   - Monitor abdominal examination; on-going bowel function   - Monitor leukocytosis; resolved - Continue Prevena; typical 7 days (Day 5/7)  - Pain control prn; avoid nephrotoxic agents  - Antiemetics prn             - Okay to mobilize; continue abdominal binder - engage therapies              - Appreciate nephrology assistance with ESRD, HD  - Remains okay to transfer to floor   All of the above findings and recommendations were discussed with the patient, and the medical team, and all of patient's questions were answered to her expressed satisfaction.  -- [name], PA-C [facility] 09/17/2024, 9:24 AM M-F: 7am - 4pm

## 2024-09-17 NOTE — Anesthesia Postprocedure Evaluation (Signed)
 Anesthesia Post Note  Patient: Brianna [name]  Procedure(s) Performed: REPAIR, HERNIA, ABDOMINAL APPROACH LAPAROTOMY, FOR LYSIS OF ADHESIONS OMENTECTOMY  Patient location during evaluation: PACU Anesthesia Type: General Level of consciousness: awake and confused Pain management: pain level controlled Vital Signs Assessment: post-procedure vital signs reviewed and stable Respiratory status: spontaneous breathing, nonlabored ventilation, respiratory function stable and patient connected to nasal cannula oxygen Cardiovascular status: blood pressure returned to baseline and stable Postop Assessment: no apparent nausea or vomiting Anesthetic complications: no   No notable events documented.   Last Vitals:  Vitals:   09/17/24 1000 09/17/24 1207  BP: (!) 108/53 (!) 91/57  Pulse: 94 89  Resp: 19 18  Temp:  36.8 C  SpO2: 97% 97%    Last Pain:  Vitals:   09/17/24 1207  TempSrc: Oral  PainSc:                  Prentice Murphy

## 2024-09-17 NOTE — Progress Notes (Addendum)
 Nutrition Follow Up Note   DOCUMENTATION CODES:   Obesity unspecified  INTERVENTION:   Recommend TPN if unable to initiate patient on an oral diet   Boost Breeze po TID with diet advancement, each supplement provides 250 kcal and 9 grams of protein  with diet advancement   Nepro Shake po TID with full liquid diet, each supplement provides 425 kcal and 19 grams protein  MVI po daily   Rena-vit po daily  Pt at high refeed risk; recommend monitor potassium, magnesium  and phosphorus labs daily until stable  Daily weights   NUTRITION DIAGNOSIS:   Inadequate oral intake related to altered GI function as evidenced by NPO status. -ongoing   GOAL:   Patient will meet greater than or equal to 90% of their needs -not met   MONITOR:   Diet advancement, Labs, Weight trends, Skin, I & O's  ASSESSMENT:   63 y/o female with h/o DM, HTN, ESRD on HD, CHF, morbid obesity, chronic pain, anxiety, depression, GERD and strangulated ventral hernia s/p small bowel resection (21cm in 2021) and who is admitted with incarcerated ventral hernia with obstruction now s/p exploratory laparotomy, reduction of incarcerated and obstructed hernia, repair of ventral hernias, lysis of adhesions & omentectomy 9/14 complicated by post op ileus.  Met with pt in room today. Pt reports that she is feeling ok today. Pt reports ongoing abdominal pain that she says feels like cramping but also reports pain around her incision. Pt is having bowel function. NGT in place with output overnight; tube is currently clamped for trial. Pt reports that good appetite and oral intake at baseline. Pt reports that she does feel hungry today. RD discussed with pt the importance of adequate nutrition needed to preserve lean muscle and support post op healing. Pt is agreeable to drink mixed berry and butter pecan Nepro. RD will add supplements with diet advancement. Pt is at high refeed risk. Would recommend TPN if patient is unable  to be initiated on an oral diet as pt is now >5 days without adequate nutrition. Per chart, pt is down ~5lbs from admission and appears to be down ~2lbs from her UBW. Next HD scheduled for Friday.   Medications reviewed and include: allopurinol , heparin , insulin , protonix   Labs reviewed: K 4.4 wnl, BUN 74(H), creat 7.04(H), P 8.2(H) Hgb 8.9(L), Hct 28.3(L) Cbgs- 93, 92, 100 x 24 hrs   UOP-  Drains- 0ml   NUTRITION - FOCUSED PHYSICAL EXAM:  Flowsheet Row Most Recent Value  Orbital Region No depletion  Upper Arm Region No depletion  Thoracic and Lumbar Region No depletion  Buccal Region No depletion  Temple Region No depletion  Clavicle Bone Region No depletion  Clavicle and Acromion Bone Region No depletion  Scapular Bone Region No depletion  Dorsal Hand No depletion  Patellar Region Moderate depletion  Anterior Thigh Region Moderate depletion  Posterior Calf Region Moderate depletion  Edema (RD Assessment) Mild  Hair Reviewed  Eyes Reviewed  Mouth Reviewed  Skin Reviewed  Nails Reviewed   Diet Order:   Diet Order             Diet NPO time specified  Diet effective now                  EDUCATION NEEDS:   No education needs have been identified at this time  Skin:  Skin Assessment: Reviewed RN Assessment (incision abdomen)  Last BM:  9/18- type 7  Height:   Ht Readings  from Last 1 Encounters:  09/12/24 4' 9 (1.448 m)    Weight:   Wt Readings from Last 1 Encounters:  09/17/24 80.7 kg    Ideal Body Weight:  43 kg  BMI:  Body mass index is 38.5 kg/m.  Estimated Nutritional Needs:   Kcal:  1700-1900kcal/day  Protein:  85-95g/day  Fluid:  1.3-1.5L/day  Augustin Shams MS, RD, LDN If unable to be reached, please send secure chat to RD inpatient available from 8:00a-4:00p daily

## 2024-09-17 NOTE — Progress Notes (Signed)
 After pt completing 4 hr NG clamp trial, NG was place back to suction and collected 250 mL. Dr. Marinda was notified. Per MD leave NG to gravity and she can have some sips of clears, but if pt starts to get bloated or feel bad or have derangement in vital signs please hook back up to suction on her NG.

## 2024-09-17 NOTE — Progress Notes (Signed)
 Progress Note    Brianna [name]  FMW:[id number] DOB: Mar 30, 1961  DOA: 09/12/2024 PCP: Cecily Katz, PA-C      Brief Narrative:    Medical records reviewed and are as summarized below:  Brianna [name] is a 63 y.o. female with PMH type II DM, ESRD on hemodialysis, OSA on CPAP, chronic pain syndrome, chronic HFpEF, GERD, gout, who was admitted to the hospital for incarcerated abdominal hernia s/p exploratory laparotomy on 09/13/2024.  The hospitalist team was consulted because of tachycardia and respiratory distress. Patient was transferred to stepdown and temporarily placed on BiPAP, later able to wean to nasal cannula at 3 L.  No baseline oxygen use.  Patient also developed postsurgical ileus requiring NG tube placement for decompression.  9/17: Vital stable, blood pressure now improved, she did develop hypotension responded to IV fluid, does not require any pressors.  Started having bowel movement.  Surgery would like to keep NJ tube in for another day due to significant amount of secretions.  Going for dialysis today.  9/18: Hemodynamically stable, borderline soft blood pressure, started on NG tube clamping trial but still having significant secretions.  Small amount of clear liquid was also started.  Assessment/Plan:   Principal Problem:   Incarcerated ventral hernia Active Problems:   ESRD (end stage renal disease) (HCC)   Hernia of abdominal wall   Shock circulatory (HCC)   SBO (small bowel obstruction) (HCC)   Body mass index is 38.5 kg/m.-Class II obesity)  Incarcerated abdominal hernia:  Postoperative ileus. S/p exploratory laparotomy on 09/13/2024.  Repeat abdominal x-ray on 09/14/2024 showed dilated loops of bowel in the upper abdomen consistent with bowel obstruction. NG tube was placed with immediate output. She is NPO.  She has been started on empiric IV antibiotics. Leukocytosis, improving Lactic acidosis: Improved Follow-up with general  surgeon. Started having bowel movement but still having significant amount of secretions through NG tube so surgery would like to keep for another day.  She was started on clamping trial and small amount of clear liquid.  Hypotension: Borderline soft blood pressure this morning which improved with IV fluid - Continue to monitor  Acute respiratory distress: Initially required BiPAP transiently and then wean to nasal cannula, currently saturating 100% on 2 L of oxygen, she uses CPAP at night. - Continue supplemental oxygen-wean as tolerated  Sinus tachycardia: Improved.  CTA chest did not show any evidence of pneumonia or pulmonary embolism.   ESRD: Follow-up with nephrologist for hemodialysis.  Chronic HFpEF: Compensated  Gout: Stable.  Decreased allopurinol  from 300 mg to 100 mg daily (renal dose)  Diet Order             Diet NPO time specified  Diet effective now                  Consultants: General Surgeon Nephrology  Procedures: Ex lap on 09/13/2024  Medications:    allopurinol   100 mg Per Tube Daily   Chlorhexidine  Gluconate Cloth  6 each Topical QHS   ezetimibe   10 mg Per Tube Daily   heparin   5,000 Units Subcutaneous Q8H   hydrOXYzine   50 mg Per Tube Daily   insulin  aspart  0-9 Units Subcutaneous Q4H   midodrine   5 mg Oral TID WC   pantoprazole  (PROTONIX ) IV  40 mg Intravenous Q24H   sertraline   25 mg Per Tube Daily   Continuous Infusions:  Anti-infectives (From admission, onward)    Start     Dose/Rate  Route Frequency Ordered Stop   09/16/24 1200  vancomycin  (VANCOCIN ) IVPB 1000 mg/200 mL premix  Status:  Discontinued        1,000 mg 200 mL/hr over 60 Minutes Intravenous Every M-W-F (Hemodialysis) 09/15/24 0309 09/16/24 1359   09/15/24 0400  metroNIDAZOLE  (FLAGYL ) IVPB 500 mg  Status:  Discontinued        500 mg 100 mL/hr over 60 Minutes Intravenous Every 12 hours 09/15/24 0255 09/16/24 1359   09/15/24 0400  vancomycin  (VANCOREADY) IVPB 2000 mg/400 mL         2,000 mg 200 mL/hr over 120 Minutes Intravenous  Once 09/15/24 0300 09/15/24 0737   09/15/24 0345  vancomycin  (VANCOCIN ) IVPB 1000 mg/200 mL premix  Status:  Discontinued        1,000 mg 200 mL/hr over 60 Minutes Intravenous  Once 09/15/24 0255 09/15/24 0300   09/15/24 0330  cefTRIAXone  (ROCEPHIN ) 2 g in sodium chloride  0.9 % 100 mL IVPB  Status:  Discontinued        2 g 200 mL/hr over 30 Minutes Intravenous Daily 09/15/24 0255 09/16/24 1359   09/13/24 0800  ceFAZolin  (ANCEF ) IVPB 1 g/50 mL premix  Status:  Discontinued        1 g 100 mL/hr over 30 Minutes Intravenous Every 8 hours 09/13/24 0241 09/13/24 1301   09/12/24 2015  ceFAZolin  (ANCEF ) IVPB 2g/100 mL premix        2 g 200 mL/hr over 30 Minutes Intravenous  Once 09/12/24 1958 09/13/24 0031       Family Communication/Anticipated D/C date and plan/Code Status   DVT prophylaxis: heparin  injection 5,000 Units Start: 09/13/24 0945 SCDs Start: 09/13/24 0242     Code Status: Full Code  Family Communication: Primary team is communicating with family. Disposition Plan: PT is recommending SNF  Subjective:  Patient was sitting in chair comfortably when seen today.  Still on 2 L of oxygen.  NG tube was clamped.  Had a bowel movement and denies any abdominal pain.  Objective:    Vitals:   09/17/24 0913 09/17/24 1000 09/17/24 1207 09/17/24 1400  BP: (!) 103/57 (!) 108/53 (!) 91/57 110/61  Pulse: 90 94 89 86  Resp: 18 19 18 18   Temp:   98.3 F (36.8 C)   TempSrc:   Oral   SpO2: 97% 97% 97% 96%  Weight:      Height:       No data found.   Intake/Output Summary (Last 24 hours) at 09/17/2024 1535 Last data filed at 09/17/2024 1505 Gross per 24 hour  Intake 501.22 ml  Output 400 ml  Net 101.22 ml   Filed Weights   09/16/24 0940 09/16/24 1425 09/17/24 0310  Weight: 82.5 kg 81.7 kg 80.7 kg    Exam: General. Chronically ill-appearing lady, in no acute distress. Pulmonary.  Lungs clear bilaterally, normal  respiratory effort. CV.  Regular rate and rhythm, no JVD, rub or murmur. Abdomen.  Soft, nontender, nondistended, BS positive.  Laparotomy wound with wound VAC CNS.  Alert and oriented .  No focal neurologic deficit. Extremities.  No edema, no cyanosis, pulses intact and symmetrical.   Data Reviewed:   I have personally reviewed following labs and imaging studies:  Labs: Labs show the following:   Basic Metabolic Panel: Recent Labs  Lab 09/14/24 0429 09/14/24 1915 09/15/24 0311 09/16/24 0327 09/17/24 0455  NA 143 140 140 139 141  K 5.0 4.6 4.5 4.5 4.4  CL 99 96* 95* 93* 98  CO2  22 17* 20* 21* 23  GLUCOSE 171* 202* 175* 107* 98  BUN 73* 87* 99* 73* 74*  CREATININE 10.45* 9.65* 10.15* 7.52* 7.04*  CALCIUM 9.9 10.2 9.8 9.2 9.2  MG  --   --   --  2.1  --   PHOS  --   --  8.2* 7.0* 8.2*   GFR Estimated Creatinine Clearance: 7.2 mL/min (A) (by C-G formula based on SCr of 7.04 mg/dL (H)). Liver Function Tests: Recent Labs  Lab 09/12/24 1456 09/15/24 0311 09/16/24 0327 09/17/24 0455  AST 23 50*  --   --   ALT 15 <5  --   --   ALKPHOS 114 106  --   --   BILITOT 0.8 0.9  --   --   PROT 8.7* 7.2  --   --   ALBUMIN  4.3 3.1* 3.0* 3.4*   Recent Labs  Lab 09/12/24 1456  LIPASE 32   No results for input(s): AMMONIA in the last 168 hours. Coagulation profile Recent Labs  Lab 09/15/24 0311  INR 2.1*    CBC: Recent Labs  Lab 09/13/24 0549 09/14/24 0429 09/15/24 0311 09/16/24 0327 09/17/24 0455  WBC 20.0* 12.9* 11.5* 7.9 9.1  NEUTROABS  --  10.2* 9.0*  --   --   HGB 12.8 11.9* 10.1* 9.7* 8.9*  HCT 40.2 37.7 30.9* 30.0* 28.3*  MCV 93.9 93.8 92.2 92.6 94.6  PLT 332 252 243 198 211   Cardiac Enzymes: No results for input(s): CKTOTAL, CKMB, CKMBINDEX, TROPONINI in the last 168 hours. BNP (last 3 results) No results for input(s): PROBNP in the last 8760 hours. CBG: Recent Labs  Lab 09/16/24 1922 09/16/24 2315 09/17/24 0307 09/17/24 0752  09/17/24 1157  GLUCAP 101* 101* 100* 92 93   D-Dimer: No results for input(s): DDIMER in the last 72 hours. Hgb A1c: No results for input(s): HGBA1C in the last 72 hours.  Lipid Profile: No results for input(s): CHOL, HDL, LDLCALC, TRIG, CHOLHDL, LDLDIRECT in the last 72 hours. Thyroid function studies: No results for input(s): TSH, T4TOTAL, T3FREE, THYROIDAB in the last 72 hours.  Invalid input(s): FREET3  Anemia work up: No results for input(s): VITAMINB12, FOLATE, FERRITIN, TIBC, IRON, RETICCTPCT in the last 72 hours. Sepsis Labs: Recent Labs  Lab 09/14/24 0429 09/14/24 1915 09/15/24 0311 09/15/24 0619 09/15/24 1615 09/15/24 1859 09/16/24 0327 09/17/24 0455  WBC 12.9*  --  11.5*  --   --   --  7.9 9.1  LATICACIDVEN  --    < > 3.6* 1.4 1.0 1.0  --   --    < > = values in this interval not displayed.    Microbiology Recent Results (from the past 240 hours)  Culture, blood (x 2)     Status: None (Preliminary result)   Collection Time: 09/15/24  3:11 AM   Specimen: BLOOD RIGHT HAND  Result Value Ref Range Status   Specimen Description BLOOD RIGHT HAND  Final   Special Requests   Final    BOTTLES DRAWN AEROBIC ONLY Blood Culture adequate volume   Culture   Final    NO GROWTH 2 DAYS Performed at [facility], [address] [place], [place] [address]    Report Status PENDING  Incomplete  Culture, blood (x 2)     Status: None (Preliminary result)   Collection Time: 09/15/24  3:11 AM   Specimen: BLOOD RIGHT ARM  Result Value Ref Range Status   Specimen Description BLOOD RIGHT ARM  Final   Special Requests   Final    BOTTLES DRAWN AEROBIC ONLY Blood Culture adequate volume   Culture   Final    NO GROWTH 2 DAYS Performed at [facility], [address] [place], [place] [address]    Report Status PENDING  Incomplete  MRSA Next Gen by PCR, Nasal     Status: None   Collection Time: 09/15/24  4:16 AM    Specimen: Nasal Mucosa; Nasal Swab  Result Value Ref Range Status   MRSA by PCR Next Gen NOT DETECTED NOT DETECTED Final    Comment: (NOTE) The GeneXpert MRSA Assay (FDA approved for NASAL specimens only), is one component of a comprehensive MRSA colonization surveillance program. It is not intended to diagnose MRSA infection nor to guide or monitor treatment for MRSA infections. Test performance is not FDA approved in patients less than 31 years old. Performed at [facility], [address] [place], [place] [address]     Procedures and diagnostic studies:  DG Abd 1 View Result Date: 09/17/2024 CLINICAL DATA:  63 year old female with incarcerated ventral hernia, obstruction. Status post exploratory laparotomy, reduced hernia, repair of hernia, lysis of adhesions, omentectomy. Postoperative day 4. Ileus. EXAM: ABDOMEN - 1 VIEW COMPARISON:  Postoperative CT Abdomen and Pelvis 09/15/2024. FINDINGS: Portable AP supine views at 0829 hours. Stable enteric tube terminating in the proximal stomach. Bulky and calcified midline uterine fibroid redemonstrated in the pelvis. Abdominal skin staples remain in place. Bowel-gas pattern has improved since the CT scout view 09/15/2024, appears nonobstructed now. Stable lung bases. Aortoiliac calcified atherosclerosis. Stable visualized osseous structures. IMPRESSION: 1. Improved bowel-gas pattern since 09/15/2024, non-obstructed now. 2. Stable enteric tube terminating in the proximal stomach. 3. Bulky calcified midline uterine fibroid. Electronically Signed   By: VEAR Hurst M.D.   On: 09/17/2024 08:59     LOS: 4 days   Amaryllis Dare, MD  [facility]   Pager on [email or web address]. If 7PM-7AM, please contact night-coverage at [email or web address]   09/17/2024, 3:35 PM

## 2024-09-17 NOTE — Progress Notes (Addendum)
 Occupational Therapy Treatment Patient Details Name: Brianna [name] MRN: [id number] DOB: 1961/05/16 Today's Date: 09/17/2024   History of present illness Pt is a 63 year old female she underwent exploratory laparotomy with Dr. Marinda in general surgery on 09/12/24 for an incarcerated ventral hernia. Her post-op hospital course was complicated by persistent tachycardia, respiratory distress requiring BiPAP support, hypotension and her ESRD. She remains on BiPAP this morning and BP is overall improving without vasopressor support.    PMH significant for ESRD on dialysis, HFpEF, DM type II, and OSA on CPAP   OT comments  Chart reviewed to date, pt greeted in bed, agreeable to OT tx session targeting improving functional activity tolerance in prep for ADL tasks. Pt continues to require increased physical assistance for bed mobility, STS, and stand pivot transfers. She requires MAX A for LB dressing and MAX A for adjusting brace. MOD A required for oral care. Would vac/NG intact pre/post session. Pt is making progress towards goals, but continues to perform ADL/functional mobility below PLOF. Pt is left in bedside chair, all needs met. OT will continue to follow.       If plan is discharge home, recommend the following:  A lot of help with walking and/or transfers;A lot of help with bathing/dressing/bathroom   Equipment Recommendations  BSC/3in1    Recommendations for Other Services      Precautions / Restrictions Precautions Precautions: Fall Recall of Precautions/Restrictions: Intact Restrictions Weight Bearing Restrictions Per Provider Order: No       Mobility Bed Mobility Overal bed mobility: Needs Assistance Bed Mobility: Rolling, Supine to Sit Rolling: Mod assist   Supine to sit: Mod assist, Max assist, +2 for physical assistance, HOB elevated          Transfers Overall transfer level: Needs assistance Equipment used: Rolling walker (2 wheels) Transfers: Sit to/from  Stand Sit to Stand: Min assist, From elevated surface, +2 safety/equipment, +2 physical assistance                 Balance Overall balance assessment: Needs assistance Sitting-balance support: Bilateral upper extremity supported, Feet supported Sitting balance-Leahy Scale: Fair     Standing balance support: Bilateral upper extremity supported, During functional activity, Reliant on assistive device for balance Standing balance-Leahy Scale: Poor                             ADL either performed or assessed with clinical judgement   ADL Overall ADL's : Needs assistance/impaired     Grooming: Sitting;Oral care;Moderate assistance           Upper Body Dressing : Maximal assistance;Sitting       Toilet Transfer: Minimal assistance;+2 for safety/equipment;Rolling walker (2 wheels);Stand-pivot Statistician Details (indicate cue type and reason): frequent vcs for technique                Extremity/Trunk Assessment              Vision       Perception     Praxis     Communication Communication Communication: No apparent difficulties   Cognition Arousal: Alert Behavior During Therapy: WFL for tasks assessed/performed, Flat affect Cognition: Cognition impaired       Memory impairment (select all impairments): Declarative long-term memory Attention impairment (select first level of impairment): Selective attention Executive functioning impairment (select all impairments): Problem solving, Reasoning  Following commands: Impaired Following commands impaired: Follows one step commands with increased time      Cueing   Cueing Techniques: Verbal cues, Tactile cues  Exercises Other Exercises Other Exercises: edu re role of OT, role of rehab, importance of continued mobilty attempts    Shoulder Instructions       General Comments vss    Pertinent Vitals/ Pain       Pain Assessment Pain Assessment: 0-10 Pain  Score: 5  Pain Location: abdomen p Pain Descriptors / Indicators: Discomfort Pain Intervention(s): Monitored during session, Repositioned  Home Living                                          Prior Functioning/Environment              Frequency  Min 2X/week        Progress Toward Goals  OT Goals(current goals can now be found in the care plan section)  Progress towards OT goals: Progressing toward goals  Acute Rehab OT Goals Time For Goal Achievement: 09/30/24  Plan      Co-evaluation    PT/OT/SLP Co-Evaluation/Treatment: Yes Reason for Co-Treatment: Complexity of the patient's impairments (multi-system involvement);For patient/therapist safety   OT goals addressed during session: ADL's and self-care      AM-PAC OT 6 Clicks Daily Activity     Outcome Measure   Help from another person eating meals?: Total Help from another person taking care of personal grooming?: A Little Help from another person toileting, which includes using toliet, bedpan, or urinal?: A Lot Help from another person bathing (including washing, rinsing, drying)?: A Lot Help from another person to put on and taking off regular upper body clothing?: A Little Help from another person to put on and taking off regular lower body clothing?: A Lot 6 Click Score: 13    End of Session Equipment Utilized During Treatment: Rolling walker (2 wheels);Oxygen  OT Visit Diagnosis: Other abnormalities of gait and mobility (R26.89);Muscle weakness (generalized) (M62.81)   Activity Tolerance Patient tolerated treatment well   Patient Left in chair;with call bell/phone within reach;with chair alarm set   Nurse Communication Mobility status        Time: 8876-8848 OT Time Calculation (min): 28 min  Charges: OT General Charges $OT Visit: 1 Visit OT Treatments $Self Care/Home Management : 8-22 mins  Therisa Sheffield, OTD OTR/L  09/17/24, 1:03 PM

## 2024-09-17 NOTE — Progress Notes (Signed)
 Physical Therapy Treatment Patient Details Name: Brianna [name] MRN: [id number] DOB: 09-17-1961 Today's Date: 09/17/2024   History of Present Illness Pt is a 63 year old female she underwent exploratory laparotomy with Dr. Marinda in general surgery on 09/12/24 for an incarcerated ventral hernia. Her post-op hospital course was complicated by persistent tachycardia, respiratory distress requiring BiPAP support, hypotension and her ESRD. She remains on BiPAP this morning and BP is overall improving without vasopressor support.    PMH significant for ESRD on dialysis, HFpEF, DM type II, and OSA on CPAP    PT Comments  Patient is agreeable to PT session. She continues to need assistance with mobility. Several standing bouts performed. She was unable to advance ambulation due to fatigue. Recommend to continue PT to maximize independence and decrease caregiver burden. PT will continue to follow.    If plan is discharge home, recommend the following: A lot of help with bathing/dressing/bathroom;Two people to help with walking and/or transfers;Assist for transportation;Help with stairs or ramp for entrance   Can travel by private vehicle     No  Equipment Recommendations  Rolling walker (2 wheels)    Recommendations for Other Services       Precautions / Restrictions Precautions Precautions: Fall Recall of Precautions/Restrictions: Intact Precaution/Restrictions Comments: abdominal wound vac Required Braces or Orthoses:  (abdominal binder) Restrictions Weight Bearing Restrictions Per Provider Order: No     Mobility  Bed Mobility Overal bed mobility: Needs Assistance Bed Mobility: Rolling, Supine to Sit Rolling: Mod assist   Supine to sit: Mod assist, Max assist, +2 for physical assistance, HOB elevated     General bed mobility comments: verbal cues for logrolling for comfort. cues for technique. abdominal binder in place before mobilizing    Transfers Overall transfer level:  Needs assistance Equipment used: Rolling walker (2 wheels) Transfers: Sit to/from Stand, Bed to chair/wheelchair/BSC Sit to Stand: Min assist, +2 physical assistance, From elevated surface   Step pivot transfers: Min assist, +2 physical assistance       General transfer comment: several standing bouts performed. cues for hand placement and technique    Ambulation/Gait               General Gait Details: unable to progress walking due to limited standing tolerance   Stairs             Wheelchair Mobility     Tilt Bed    Modified Rankin (Stroke Patients Only)       Balance Overall balance assessment: Needs assistance Sitting-balance support: Bilateral upper extremity supported, Feet supported Sitting balance-Leahy Scale: Fair     Standing balance support: Bilateral upper extremity supported, During functional activity, Reliant on assistive device for balance Standing balance-Leahy Scale: Poor Standing balance comment: using rolling walker ofr support in standing                            Communication Communication Communication: No apparent difficulties  Cognition Arousal: Alert Behavior During Therapy: WFL for tasks assessed/performed, Flat affect   PT - Cognitive impairments: No apparent impairments                         Following commands: Impaired Following commands impaired: Follows one step commands with increased time    Cueing Cueing Techniques: Verbal cues, Tactile cues  Exercises      General Comments General comments (skin integrity, edema, etc.): Sp02 88-89% on  room air and in the 90's on 2 L02      Pertinent Vitals/Pain Pain Assessment Pain Assessment: 0-10 Pain Score: 5  Pain Location: abdomen Pain Descriptors / Indicators: Discomfort Pain Intervention(s): Limited activity within patient's tolerance    Home Living                          Prior Function            PT Goals (current  goals can now be found in the care plan section) Acute Rehab PT Goals Patient Stated Goal: to feel better PT Goal Formulation: With patient Time For Goal Achievement: 09/30/24 Potential to Achieve Goals: Fair Progress towards PT goals: Progressing toward goals    Frequency    Min 2X/week      PT Plan      Co-evaluation PT/OT/SLP Co-Evaluation/Treatment: Yes Reason for Co-Treatment: Complexity of the patient's impairments (multi-system involvement);For patient/therapist safety PT goals addressed during session: Mobility/safety with mobility OT goals addressed during session: ADL's and self-care      AM-PAC PT 6 Clicks Mobility   Outcome Measure  Help needed turning from your back to your side while in a flat bed without using bedrails?: A Little Help needed moving from lying on your back to sitting on the side of a flat bed without using bedrails?: A Lot Help needed moving to and from a bed to a chair (including a wheelchair)?: A Lot Help needed standing up from a chair using your arms (e.g., wheelchair or bedside chair)?: A Lot Help needed to walk in hospital room?: A Lot Help needed climbing 3-5 steps with a railing? : A Lot 6 Click Score: 13    End of Session Equipment Utilized During Treatment: Oxygen Activity Tolerance: Patient tolerated treatment well;Patient limited by fatigue Patient left: in chair;with call bell/phone within reach;with chair alarm set Nurse Communication: Mobility status PT Visit Diagnosis: Unsteadiness on feet (R26.81);Muscle weakness (generalized) (M62.81);History of falling (Z91.81);Difficulty in walking, not elsewhere classified (R26.2)     Time: 1123-1150 PT Time Calculation (min) (ACUTE ONLY): 27 min  Charges:    $Therapeutic Activity: 8-22 mins PT General Charges $$ ACUTE PT VISIT: 1 Visit                     Randine Essex, PT, MPT    Randine LULLA Essex 09/17/2024, 1:40 PM

## 2024-09-17 NOTE — Progress Notes (Signed)
 [facility]  ROUNDING NOTE   Subjective:   Brianna [name] is a 63 year old female with past medical conditions including obstructive sleep apnea on CPAP, gout, GERD, type 2 diabetes, and end-stage renal disease on hemodialysis.  Patient presented to the hospital due to incarcerated and obstructed ventral hernia, experienced exploratory laparotomy on 9/13.  Patient is currently admitted for SBO (small bowel obstruction) (HCC) [K56.609] Hernia of abdominal wall [K43.9] Incarcerated ventral hernia [K43.6]  Postoperatively patient found to be tachycardic and requiring supplemental oxygen, 3 L.  Patient is known to our practice and receives outpatient dialysis treatments at [facility] on a MWF schedule, supervised by Dr. Marcelino.    Update: Patient seen resting in bed Easily aroused Denies shortness of breath, remains on 2L Continues to have abd pain    Objective:  Vital signs in last 24 hours:  Temp:  [97.8 F (36.6 C)-98.7 F (37.1 C)] 98.6 F (37 C) (09/18 0730) Pulse Rate:  [82-103] 90 (09/18 0913) Resp:  [15-31] 18 (09/18 0913) BP: (85-164)/(17-87) 103/57 (09/18 0913) SpO2:  [90 %-99 %] 97 % (09/18 0913) Weight:  [80.7 kg-81.7 kg] 80.7 kg (09/18 0310)  Weight change: -0.9 kg Filed Weights   09/16/24 0940 09/16/24 1425 09/17/24 0310  Weight: 82.5 kg 81.7 kg 80.7 kg    Intake/Output: I/O last 3 completed shifts: In: 300 [IV Piggyback:300] Out: 500 [Urine:100; Emesis/NG output:400]   Intake/Output this shift:  No intake/output data recorded.  Physical Exam: General: NAD  Head: Normocephalic, atraumatic. Moist oral mucosal membranes  Eyes: Anicteric  Lungs:  [place] O2, decreased breath sounds at bases  Heart: Regular rate and rhythm  Abdomen:  Soft, tender, NPWV in place, abdominal binder  Extremities: No peripheral edema.  Neurologic: Awake, alert, conversant  Skin: Warm,dry, no rash  Access: Left upper aVF, right IJ PermCath    Basic  Metabolic Panel: Recent Labs  Lab 09/14/24 0429 09/14/24 1915 09/15/24 0311 09/16/24 0327 09/17/24 0455  NA 143 140 140 139 141  K 5.0 4.6 4.5 4.5 4.4  CL 99 96* 95* 93* 98  CO2 22 17* 20* 21* 23  GLUCOSE 171* 202* 175* 107* 98  BUN 73* 87* 99* 73* 74*  CREATININE 10.45* 9.65* 10.15* 7.52* 7.04*  CALCIUM 9.9 10.2 9.8 9.2 9.2  MG  --   --   --  2.1  --   PHOS  --   --  8.2* 7.0* 8.2*    Liver Function Tests: Recent Labs  Lab 09/12/24 1456 09/15/24 0311 09/16/24 0327 09/17/24 0455  AST 23 50*  --   --   ALT 15 <5  --   --   ALKPHOS 114 106  --   --   BILITOT 0.8 0.9  --   --   PROT 8.7* 7.2  --   --   ALBUMIN  4.3 3.1* 3.0* 3.4*   Recent Labs  Lab 09/12/24 1456  LIPASE 32   No results for input(s): AMMONIA in the last 168 hours.  CBC: Recent Labs  Lab 09/13/24 0549 09/14/24 0429 09/15/24 0311 09/16/24 0327 09/17/24 0455  WBC 20.0* 12.9* 11.5* 7.9 9.1  NEUTROABS  --  10.2* 9.0*  --   --   HGB 12.8 11.9* 10.1* 9.7* 8.9*  HCT 40.2 37.7 30.9* 30.0* 28.3*  MCV 93.9 93.8 92.2 92.6 94.6  PLT 332 252 243 198 211    Cardiac Enzymes: No results for input(s): CKTOTAL, CKMB, CKMBINDEX, TROPONINI in the last 168 hours.  BNP:  Invalid input(s): POCBNP  CBG: Recent Labs  Lab 09/16/24 1643 09/16/24 1922 09/16/24 2315 09/17/24 0307 09/17/24 0752  GLUCAP 93 101* 101* 100* 92    Microbiology: Results for orders placed or performed during the hospital encounter of 09/12/24  Culture, blood (x 2)     Status: None (Preliminary result)   Collection Time: 09/15/24  3:11 AM   Specimen: BLOOD RIGHT HAND  Result Value Ref Range Status   Specimen Description BLOOD RIGHT HAND  Final   Special Requests   Final    BOTTLES DRAWN AEROBIC ONLY Blood Culture adequate volume   Culture   Final    NO GROWTH 2 DAYS Performed at [facility], [address], [place], [place] [address]    Report Status PENDING  Incomplete  Culture, blood (x 2)      Status: None (Preliminary result)   Collection Time: 09/15/24  3:11 AM   Specimen: BLOOD RIGHT ARM  Result Value Ref Range Status   Specimen Description BLOOD RIGHT ARM  Final   Special Requests   Final    BOTTLES DRAWN AEROBIC ONLY Blood Culture adequate volume   Culture   Final    NO GROWTH 2 DAYS Performed at [facility], [address], [place], [place] [address]    Report Status PENDING  Incomplete  MRSA Next Gen by PCR, Nasal     Status: None   Collection Time: 09/15/24  4:16 AM   Specimen: Nasal Mucosa; Nasal Swab  Result Value Ref Range Status   MRSA by PCR Next Gen NOT DETECTED NOT DETECTED Final    Comment: (NOTE) The GeneXpert MRSA Assay (FDA approved for NASAL specimens only), is one component of a comprehensive MRSA colonization surveillance program. It is not intended to diagnose MRSA infection nor to guide or monitor treatment for MRSA infections. Test performance is not FDA approved in patients less than 70 years old. Performed at [facility], [address] [place], [place] [address]     Coagulation Studies: Recent Labs    09/15/24 0311  LABPROT 24.2*  INR 2.1*    Urinalysis: No results for input(s): COLORURINE, LABSPEC, PHURINE, GLUCOSEU, HGBUR, BILIRUBINUR, KETONESUR, PROTEINUR, UROBILINOGEN, NITRITE, LEUKOCYTESUR in the last 72 hours.  Invalid input(s): APPERANCEUR    Imaging: DG Abd 1 View Result Date: 09/17/2024 CLINICAL DATA:  63 year old female with incarcerated ventral hernia, obstruction. Status post exploratory laparotomy, reduced hernia, repair of hernia, lysis of adhesions, omentectomy. Postoperative day 4. Ileus. EXAM: ABDOMEN - 1 VIEW COMPARISON:  Postoperative CT Abdomen and Pelvis 09/15/2024. FINDINGS: Portable AP supine views at 0829 hours. Stable enteric tube terminating in the proximal stomach. Bulky and calcified midline uterine fibroid redemonstrated in the pelvis. Abdominal skin staples  remain in place. Bowel-gas pattern has improved since the CT scout view 09/15/2024, appears nonobstructed now. Stable lung bases. Aortoiliac calcified atherosclerosis. Stable visualized osseous structures. IMPRESSION: 1. Improved bowel-gas pattern since 09/15/2024, non-obstructed now. 2. Stable enteric tube terminating in the proximal stomach. 3. Bulky calcified midline uterine fibroid. Electronically Signed   By: VEAR Hurst M.D.   On: 09/17/2024 08:59   CT ABDOMEN PELVIS W CONTRAST Result Date: 09/15/2024 CLINICAL DATA:  Status post reduction and repair large incarcerated and obstructed ventral hernia on 09/13/2024. Postoperative tachycardia, hypotension and abdominal pain. EXAM: CT ABDOMEN AND PELVIS WITH CONTRAST TECHNIQUE: Multidetector CT imaging of the abdomen and pelvis was performed using the standard protocol following bolus administration of intravenous contrast. RADIATION DOSE REDUCTION: This exam was  performed according to the departmental dose-optimization program which includes automated exposure control, adjustment of the mA and/or kV according to patient size and/or use of iterative reconstruction technique. CONTRAST:  OMNIPAQUE  IOHEXOL  300 MG/ML  SOLN COMPARISON:  09/12/2024 FINDINGS: Lower chest: Bilateral lower lobe atelectasis. Hepatobiliary: Normal appearance of liver. No masses or biliary dilatation. The gallbladder contains some vicariously excreted contrast. Pancreas: Unremarkable. No pancreatic ductal dilatation or surrounding inflammatory changes. Spleen: Normal in size without focal abnormality. Adrenals/Urinary Tract: Stable hyperplastic appearance of both adrenal glands without focal mass. Both kidneys have a normal appearance without hydronephrosis, calculi or lesion. The bladder is completely decompressed. Stomach/Bowel: Nasogastric tube extends to the mid stomach. Dilated proximal small bowel measuring up to 5 cm in maximum caliber. The dilated jejunum is hyperemic and  fluid-filled. There does appear to be transition from dilated to nondilated small bowel somewhere in the anterior pelvis. Adhesion cannot be excluded. The colon is decompressed. There is no evidence of recurrent hernia after recent surgical repair with expected small amount of seroma fluid and air within the previous large ventral hernia sac in pannicular fat. No free intraperitoneal air or focal abscess identified. Vascular/Lymphatic: Stable aortoiliac atherosclerosis without aneurysm. No lymphadenopathy identified. Reproductive: Stable uterine enlargement with a large dominant heavily calcified degenerated fibroid. Other: No ascites. Musculoskeletal: No acute or significant osseous findings. IMPRESSION: 1. Dilated proximal small bowel measuring up to 5 cm in maximum caliber. The dilated jejunum is hyperemic and fluid-filled. There does appear to be transition from dilated to nondilated small bowel somewhere in the anterior pelvis. Small bowel obstruction due to adhesion cannot be excluded. 2. No evidence of recurrent hernia after recent surgical repair with expected small amount of seroma fluid and air within the previous large ventral hernia sac in pannicular fat. 3. Stable uterine enlargement with a large dominant heavily calcified degenerated fibroid. 4. Stable hyperplastic appearance of both adrenal glands without focal mass. Electronically Signed   By: Marcey Moan M.D.   On: 09/15/2024 15:50     Medications:      allopurinol   100 mg Per Tube Daily   Chlorhexidine  Gluconate Cloth  6 each Topical QHS   ezetimibe   10 mg Per Tube Daily   heparin   5,000 Units Subcutaneous Q8H   hydrOXYzine   50 mg Per Tube Daily   insulin  aspart  0-9 Units Subcutaneous Q4H   pantoprazole  (PROTONIX ) IV  40 mg Intravenous Q24H   sertraline   25 mg Per Tube Daily   albuterol , ALPRAZolam , HYDROmorphone  (DILAUDID ) injection, methocarbamol  (ROBAXIN ) injection, ondansetron  (ZOFRAN ) IV, ondansetron ,  zolpidem   Assessment/ Plan:  Ms. Cadey Bazile is a 63 y.o.  female with past medical conditions including obstructive sleep apnea on CPAP, gout, GERD, type 2 diabetes, and end-stage renal disease on hemodialysis.  Patient presented to the hospital due to incarcerated and obstructed ventral hernia, experienced exploratory laparotomy on 9/13.  Patient is currently admitted for SBO (small bowel obstruction) (HCC) [K56.609] Hernia of abdominal wall [K43.9] Incarcerated ventral hernia [K43.6]  [facility]/MWF/right IJ PermCath/left upper AVF.   End-stage renal disease on hemodialysis.  Continue MWF schedule. Patient received 1.5hr of dialysis yesterday, terminated early due to abd pain. Next treatment scheduled for Friday.   2. Acute respiratory failure, placed on Bipap in ICU.  Now weaned to nasal cannula.  Remains on 2 L/min  3. Anemia of chronic kidney disease Lab Results  Component Value Date   HGB 8.9 (L) 09/17/2024    Hemoglobin acceptable.  Will continue  to monitor for now.  4.  Incarcerated ventral hernia status post exploratory laparotomy on 9/13.  Wound VAC placed.  General surgery continues to follow and offer supportive measures and pain management.  5. Secondary Hyperparathyroidism: with outpatient labs: PTH 993, phosphorus 4.9, calcium 9.5 on 09/07/24.   Lab Results  Component Value Date   PTH 651 (H) 05/24/2023   CALCIUM 9.2 09/17/2024   CAION 1.11 (L) 08/08/2023   PHOS 8.2 (H) 09/17/2024    Sevelamer  tablets were changed to powder-but we will hold for now until acute GI issues are resolved.   LOS: 4 Neeva Trew 9/18/202510:40 AM

## 2024-09-18 DIAGNOSIS — K436 Other and unspecified ventral hernia with obstruction, without gangrene: Secondary | ICD-10-CM | POA: Diagnosis not present

## 2024-09-18 DIAGNOSIS — K439 Ventral hernia without obstruction or gangrene: Secondary | ICD-10-CM | POA: Diagnosis not present

## 2024-09-18 DIAGNOSIS — K56609 Unspecified intestinal obstruction, unspecified as to partial versus complete obstruction: Secondary | ICD-10-CM | POA: Diagnosis not present

## 2024-09-18 DIAGNOSIS — N186 End stage renal disease: Secondary | ICD-10-CM | POA: Diagnosis not present

## 2024-09-18 LAB — CBC
HCT: 27.6 % — ABNORMAL LOW (ref 36.0–46.0)
Hemoglobin: 8.9 g/dL — ABNORMAL LOW (ref 12.0–15.0)
MCH: 30.3 pg (ref 26.0–34.0)
MCHC: 32.2 g/dL (ref 30.0–36.0)
MCV: 93.9 fL (ref 80.0–100.0)
Platelets: 196 K/uL (ref 150–400)
RBC: 2.94 MIL/uL — ABNORMAL LOW (ref 3.87–5.11)
RDW: 15.7 % — ABNORMAL HIGH (ref 11.5–15.5)
WBC: 10.4 K/uL (ref 4.0–10.5)
nRBC: 0.4 % — ABNORMAL HIGH (ref 0.0–0.2)

## 2024-09-18 LAB — RENAL FUNCTION PANEL
Albumin: 3.1 g/dL — ABNORMAL LOW (ref 3.5–5.0)
Anion gap: 22 — ABNORMAL HIGH (ref 5–15)
BUN: 97 mg/dL — ABNORMAL HIGH (ref 8–23)
CO2: 22 mmol/L (ref 22–32)
Calcium: 9.3 mg/dL (ref 8.9–10.3)
Chloride: 96 mmol/L — ABNORMAL LOW (ref 98–111)
Creatinine, Ser: 8.56 mg/dL — ABNORMAL HIGH (ref 0.44–1.00)
GFR, Estimated: 5 mL/min — ABNORMAL LOW (ref >60)
Glucose, Bld: 84 mg/dL (ref 70–99)
Phosphorus: 8.2 mg/dL — ABNORMAL HIGH (ref 2.5–4.6)
Potassium: 4.8 mmol/L (ref 3.5–5.1)
Sodium: 140 mmol/L (ref 135–145)

## 2024-09-18 LAB — GLUCOSE, CAPILLARY
Glucose-Capillary: 86 mg/dL (ref 70–99)
Glucose-Capillary: 80 mg/dL (ref 70–99)
Glucose-Capillary: 74 mg/dL (ref 70–99)
Glucose-Capillary: 86 mg/dL (ref 70–99)

## 2024-09-18 MED ORDER — THIAMINE HCL 100 MG PO TABS
100.0000 mg | ORAL_TABLET | Freq: Every day | ORAL | Status: DC
  Administered 2024-09-19 – 2024-09-24 (×6): 100 mg via ORAL
  Filled 2024-09-18 (×12): qty 1

## 2024-09-18 MED ORDER — OXYCODONE HCL 5 MG PO TABS
10.0000 mg | ORAL_TABLET | ORAL | Status: DC | PRN
  Administered 2024-09-18 – 2024-09-24 (×15): 10 mg via ORAL
  Filled 2024-09-18 (×16): qty 2

## 2024-09-18 MED ORDER — HEPARIN SODIUM (PORCINE) 1000 UNIT/ML IJ SOLN
INTRAMUSCULAR | Status: AC
  Filled 2024-09-18: qty 4

## 2024-09-18 MED ORDER — HYDROMORPHONE HCL 1 MG/ML IJ SOLN
0.5000 mg | INTRAMUSCULAR | Status: DC | PRN
  Administered 2024-09-18 – 2024-09-22 (×4): 0.5 mg via INTRAVENOUS
  Filled 2024-09-18 (×4): qty 0.5

## 2024-09-18 MED ORDER — RENA-VITE PO TABS
1.0000 | ORAL_TABLET | Freq: Every day | ORAL | Status: DC
  Administered 2024-09-18: 1
  Filled 2024-09-18: qty 1

## 2024-09-18 MED ORDER — ADULT MULTIVITAMIN W/MINERALS CH
1.0000 | ORAL_TABLET | Freq: Every day | ORAL | Status: DC
  Administered 2024-09-19 – 2024-09-20 (×2): 1 via ORAL
  Filled 2024-09-18 (×2): qty 1

## 2024-09-18 MED ORDER — INFLUENZA VIRUS VACC SPLIT PF (FLUZONE) 0.5 ML IM SUSY
0.5000 mL | PREFILLED_SYRINGE | INTRAMUSCULAR | Status: AC
  Administered 2024-09-19: 0.5 mL via INTRAMUSCULAR
  Filled 2024-09-18: qty 0.5

## 2024-09-18 MED ORDER — OXYCODONE HCL 5 MG PO TABS
5.0000 mg | ORAL_TABLET | ORAL | Status: DC | PRN
  Administered 2024-09-20 – 2024-09-21 (×2): 5 mg via ORAL
  Filled 2024-09-18: qty 1

## 2024-09-18 MED ORDER — ACETAMINOPHEN 500 MG PO TABS
1000.0000 mg | ORAL_TABLET | Freq: Three times a day (TID) | ORAL | Status: DC
  Administered 2024-09-18 – 2024-09-24 (×12): 1000 mg via ORAL
  Filled 2024-09-18 (×13): qty 2

## 2024-09-18 MED ORDER — BOOST / RESOURCE BREEZE PO LIQD CUSTOM
1.0000 | Freq: Three times a day (TID) | ORAL | Status: DC
  Administered 2024-09-18 – 2024-09-19 (×2): 1 via ORAL

## 2024-09-18 NOTE — TOC PASRR Note (Signed)
 RE: Brianna [name] Date of Birth: 10-May-1961 Date: 09/18/2024   To Whom It May Concern:  Please be advised that the above-named patient will require a short-term nursing home stay - anticipated 30 days or less for rehabilitation and strengthening.  The plan is for return home.

## 2024-09-18 NOTE — TOC Initial Note (Addendum)
 Transition of Care [facility]) - Initial/Assessment Note    Patient Details  Name: Brianna [name] MRN: [id number] Date of Birth: [birth year]  Transition of Care [facility]) CM/SW Contact:    Lauraine JAYSON Carpen, LCSW Phone Number: 09/18/2024, 12:37 PM  Clinical Narrative:   CSW met with patient. No family at bedside. CSW introduced role and explained that therapy recommendations would be discussed. Patient is agreeable to SNF placement. First preference is [facility]. Left message for admissions coordinator to notify. No further concerns. CSW will continue to follow patient for support and facilitate discharge once medically stable.  1:01 pm: Uploaded clinicals into [place] Must for PASARR review.               2:07 pm: PASARR obtained: [id number]. Expires 10/19.  Expected Discharge Plan: Skilled Nursing Facility Barriers to Discharge: Continued Medical Work up   Patient Goals and CMS Choice            Expected Discharge Plan and Services     Post Acute Care Choice: Skilled Nursing Facility Living arrangements for the past 2 months: Single Family Home                                      Prior Living Arrangements/Services Living arrangements for the past 2 months: Single Family Home Lives with:: Roommate Patient language and need for interpreter reviewed:: Yes Do you feel safe going back to the place where you live?: Yes      Need for Family Participation in Patient Care: Yes (Comment) Care giver support system in place?: Yes (comment)   Criminal Activity/Legal Involvement Pertinent to Current Situation/Hospitalization: No - Comment as needed  Activities of Daily Living      Permission Sought/Granted Permission sought to share information with : Facility Industrial/product designer granted to share information with : Yes, Verbal Permission Granted     Permission granted to share info w AGENCY: SNF's        Emotional Assessment Appearance:: Appears stated  age Attitude/Demeanor/Rapport: Engaged, Gracious Affect (typically observed): Accepting, Appropriate, Calm, Pleasant Orientation: : Oriented to Self, Oriented to Place, Oriented to  Time, Oriented to Situation Alcohol / Substance Use: Not Applicable Psych Involvement: No (comment)  Admission diagnosis:  SBO (small bowel obstruction) (HCC) [K56.609] Hernia of abdominal wall [K43.9] Incarcerated ventral hernia [K43.6] Patient Active Problem List   Diagnosis Date Noted   Shock circulatory (HCC) 09/15/2024   SBO (small bowel obstruction) (HCC) 09/15/2024   Incarcerated ventral hernia 09/13/2024   Hernia of abdominal wall 09/13/2024   Bilateral chronic knee pain 07/23/2024   Chronic pain syndrome 07/14/2024   Chronic painful diabetic neuropathy (HCC) 07/14/2024   Anxiety and depression 07/14/2024   Morbidly obese (HCC) 05/29/2023   Acute on chronic diastolic CHF (congestive heart failure) (HCC) 05/29/2023   Anemia of chronic kidney failure 05/29/2023   Acute on chronic congestive heart failure (HCC) 05/24/2023   Anasarca 05/23/2023   (HFpEF) heart failure with preserved ejection fraction (HCC) 05/23/2023   ESRD (end stage renal disease) (HCC) 05/23/2023   Dyspnea 01/18/2021   Bilateral lower extremity edema 01/18/2021   Volume overload 01/18/2021   Bronchopneumonia 01/12/2021   Type II diabetes mellitus with renal manifestations (HCC) 01/12/2021   Acid reflux    Asthma    Anemia due to stage 4 chronic kidney disease (HCC)    Hypokalemia    Type 2 diabetes mellitus  without complication, with long-term current use of insulin  (HCC)    Acute renal failure superimposed on stage 4 chronic kidney disease (HCC)    Hypertension    Strangulated ventral hernia 02/09/2020   PCP:  Cecily Katz, PA-C Pharmacy:   [facility] - [place], [place] - [address] [place] [address] Phone: [phone] Fax: [phone]  [facility] -  [place], [place] - [address] [place] [address] Phone: [phone] Fax: [phone]  [id number] [place] [address] [place] [address] Phone: [phone] Fax: [phone]  [facility] [address] [place] [address] Phone: [phone] Fax: [phone]     Social Drivers of Health (SDOH) Social History: SDOH Screenings   Food Insecurity: No Food Insecurity (09/13/2024)  Housing: Unknown (09/13/2024)  Transportation Needs: Unknown (09/13/2024)  Utilities: Not At Risk (09/13/2024)  Depression (PHQ2-9): Low Risk  (09/08/2024)  Financial Resource Strain: High Risk (06/04/2024)   Received from [facility]  Tobacco Use: Low Risk  (09/12/2024)   SDOH Interventions:     Readmission Risk Interventions     No data to display

## 2024-09-18 NOTE — Progress Notes (Signed)
 Nutrition Follow Up Note   DOCUMENTATION CODES:   Obesity unspecified  INTERVENTION:   Recommend TPN if unable to advance pt's diet within 24 hrs  Boost Breeze po TID, each supplement provides 250 kcal and 9 grams of protein  with diet advancement   Nepro Shake po TID with full liquid diet, each supplement provides 425 kcal and 19 grams protein  MVI po daily   Thiamine  100mg  po daily x 7 days  Rena-vit po daily  Pt at high refeed risk; recommend monitor potassium, magnesium  and phosphorus labs daily until stable  Daily weights   NUTRITION DIAGNOSIS:   Inadequate oral intake related to altered GI function as evidenced by NPO status. -ongoing   GOAL:   Patient will meet greater than or equal to 90% of their needs -not met   MONITOR:   Diet advancement, Labs, Weight trends, Skin, I & O's  ASSESSMENT:   63 y/o female with h/o DM, HTN, ESRD on HD, CHF, morbid obesity, chronic pain, anxiety, depression, GERD and strangulated ventral hernia s/p small bowel resection (21cm in 2021) and who is admitted with incarcerated ventral hernia with obstruction now s/p exploratory laparotomy, reduction of incarcerated and obstructed hernia, repair of ventral hernias, lysis of adhesions & omentectomy 9/14 complicated by post op ileus.  Pt initiated on a clear liquid diet today. RD will add supplements and vitamins to help pt meet her estimated needs and to replace losses from HD. Pt would like mixed berry and butter pecan Nepro with diet advancement. Pt is at high refeed risk. Pt with ongoing bowel function. NGT with output overnight. Per chart, pt is up ~5lbs since admission and is up ~8lbs from her UBW.   Medications reviewed and include: allopurinol , heparin , insulin , protonix , midodrine   Labs reviewed: K 4.8 wnl, BUN 97(H), creat 8.56(H), P 8.2(H) Hgb 8.9(L), Hct 27.6(L) Cbgs- 80, 86 x 24 hrs   Drains- 0ml   Diet Order:   Diet Order             Diet clear liquid Fluid  consistency: Thin  Diet effective now                  EDUCATION NEEDS:   No education needs have been identified at this time  Skin:  Skin Assessment: Reviewed RN Assessment (incision abdomen)  Last BM:  9/19- type 5  Height:   Ht Readings from Last 1 Encounters:  09/12/24 4' 9 (1.448 m)    Weight:   Wt Readings from Last 1 Encounters:  09/18/24 85.5 kg    Ideal Body Weight:  43 kg  BMI:  Body mass index is 40.79 kg/m.  Estimated Nutritional Needs:   Kcal:  1700-1900kcal/day  Protein:  85-95g/day  Fluid:  1.3-1.5L/day  Augustin Shams MS, RD, LDN If unable to be reached, please send secure chat to RD inpatient available from 8:00a-4:00p daily

## 2024-09-18 NOTE — Progress Notes (Signed)
 [facility]  ROUNDING NOTE   Subjective:   Brianna [name] is a 63 year old female with past medical conditions including obstructive sleep apnea on CPAP, gout, GERD, type 2 diabetes, and end-stage renal disease on hemodialysis.  Patient presented to the hospital due to incarcerated and obstructed ventral hernia, experienced exploratory laparotomy on 9/13.  Patient is currently admitted for SBO (small bowel obstruction) (HCC) [K56.609] Hernia of abdominal wall [K43.9] Incarcerated ventral hernia [K43.6]  Postoperatively patient found to be tachycardic and requiring supplemental oxygen, 3 L.  Patient is known to our practice and receives outpatient dialysis treatments at [facility] on a MWF schedule, supervised by Dr. Marcelino.    Update: Patient seen and evaluated during dialysis   HEMODIALYSIS FLOWSHEET:  Blood Flow Rate (mL/min): 199 mL/min Arterial Pressure (mmHg): -91.91 mmHg Venous Pressure (mmHg): 73.53 mmHg TMP (mmHg): 1.41 mmHg Ultrafiltration Rate (mL/min): 344 mL/min Dialysate Flow Rate (mL/min): 300 ml/min  Complains of abd pain, headache and nausea Refuses medications Requesting to end treatment.     Objective:  Vital signs in last 24 hours:  Temp:  [97.8 F (36.6 C)-99.3 F (37.4 C)] 98.5 F (36.9 C) (09/19 1120) Pulse Rate:  [80-96] 85 (09/19 1120) Resp:  [16-24] 20 (09/19 1120) BP: (90-114)/(54-71) 97/54 (09/19 1120) SpO2:  [95 %-100 %] 97 % (09/19 1120) FiO2 (%):  [28 %] 28 % (09/18 2327) Weight:  [81.2 kg-85.5 kg] 85.5 kg (09/19 0755)  Weight change: -1.3 kg Filed Weights   09/17/24 0310 09/18/24 0500 09/18/24 0755  Weight: 80.7 kg 81.2 kg 85.5 kg    Intake/Output: I/O last 3 completed shifts: In: 501.2 [IV Piggyback:501.2] Out: 750 [Urine:50; Emesis/NG output:700]   Intake/Output this shift:  Total I/O In: 85 [NG/GT:85] Out: 25 [Emesis/NG output:25]  Physical Exam: General: NAD  Head: Normocephalic, atraumatic. Moist  oral mucosal membranes  Eyes: Anicteric  Lungs:  [place] O2, decreased breath sounds at bases  Heart: Regular rate and rhythm  Abdomen:  Soft, tender, NPWV in place, abdominal binder  Extremities: No peripheral edema.  Neurologic: Awake, alert, conversant  Skin: Warm,dry, no rash  Access: Left upper aVF, right IJ PermCath    Basic Metabolic Panel: Recent Labs  Lab 09/14/24 1915 09/15/24 0311 09/16/24 0327 09/17/24 0455 09/18/24 0610  NA 140 140 139 141 140  K 4.6 4.5 4.5 4.4 4.8  CL 96* 95* 93* 98 96*  CO2 17* 20* 21* 23 22  GLUCOSE 202* 175* 107* 98 84  BUN 87* 99* 73* 74* 97*  CREATININE 9.65* 10.15* 7.52* 7.04* 8.56*  CALCIUM 10.2 9.8 9.2 9.2 9.3  MG  --   --  2.1  --   --   PHOS  --  8.2* 7.0* 8.2* 8.2*    Liver Function Tests: Recent Labs  Lab 09/12/24 1456 09/15/24 0311 09/16/24 0327 09/17/24 0455 09/18/24 0610  AST 23 50*  --   --   --   ALT 15 <5  --   --   --   ALKPHOS 114 106  --   --   --   BILITOT 0.8 0.9  --   --   --   PROT 8.7* 7.2  --   --   --   ALBUMIN  4.3 3.1* 3.0* 3.4* 3.1*   Recent Labs  Lab 09/12/24 1456  LIPASE 32   No results for input(s): AMMONIA in the last 168 hours.  CBC: Recent Labs  Lab 09/14/24 0429 09/15/24 9688 09/16/24 0327 09/17/24 0455 09/18/24 9389  WBC 12.9* 11.5* 7.9 9.1 10.4  NEUTROABS 10.2* 9.0*  --   --   --   HGB 11.9* 10.1* 9.7* 8.9* 8.9*  HCT 37.7 30.9* 30.0* 28.3* 27.6*  MCV 93.8 92.2 92.6 94.6 93.9  PLT 252 243 198 211 196    Cardiac Enzymes: No results for input(s): CKTOTAL, CKMB, CKMBINDEX, TROPONINI in the last 168 hours.  BNP: Invalid input(s): POCBNP  CBG: Recent Labs  Lab 09/17/24 1157 09/17/24 1554 09/17/24 2040 09/17/24 2320 09/18/24 0353  GLUCAP 93 89 97 96 86    Microbiology: Results for orders placed or performed during the hospital encounter of 09/12/24  Culture, blood (x 2)     Status: None (Preliminary result)   Collection Time: 09/15/24  3:11 AM   Specimen:  BLOOD RIGHT HAND  Result Value Ref Range Status   Specimen Description BLOOD RIGHT HAND  Final   Special Requests   Final    BOTTLES DRAWN AEROBIC ONLY Blood Culture adequate volume   Culture   Final    NO GROWTH 3 DAYS Performed at [facility], [address] [place], [place] [address]    Report Status PENDING  Incomplete  Culture, blood (x 2)     Status: None (Preliminary result)   Collection Time: 09/15/24  3:11 AM   Specimen: BLOOD RIGHT ARM  Result Value Ref Range Status   Specimen Description BLOOD RIGHT ARM  Final   Special Requests   Final    BOTTLES DRAWN AEROBIC ONLY Blood Culture adequate volume   Culture   Final    NO GROWTH 3 DAYS Performed at [facility], [address] [place], [place] [address]    Report Status PENDING  Incomplete  MRSA Next Gen by PCR, Nasal     Status: None   Collection Time: 09/15/24  4:16 AM   Specimen: Nasal Mucosa; Nasal Swab  Result Value Ref Range Status   MRSA by PCR Next Gen NOT DETECTED NOT DETECTED Final    Comment: (NOTE) The GeneXpert MRSA Assay (FDA approved for NASAL specimens only), is one component of a comprehensive MRSA colonization surveillance program. It is not intended to diagnose MRSA infection nor to guide or monitor treatment for MRSA infections. Test performance is not FDA approved in patients less than 8 years old. Performed at [facility], [address] [place], [place] [address]     Coagulation Studies: No results for input(s): LABPROT, INR in the last 72 hours.   Urinalysis: No results for input(s): COLORURINE, LABSPEC, PHURINE, GLUCOSEU, HGBUR, BILIRUBINUR, KETONESUR, PROTEINUR, UROBILINOGEN, NITRITE, LEUKOCYTESUR in the last 72 hours.  Invalid input(s): APPERANCEUR    Imaging: DG Abd 1 View Result Date: 09/17/2024 CLINICAL DATA:  63 year old female with incarcerated ventral hernia, obstruction. Status post exploratory laparotomy,  reduced hernia, repair of hernia, lysis of adhesions, omentectomy. Postoperative day 4. Ileus. EXAM: ABDOMEN - 1 VIEW COMPARISON:  Postoperative CT Abdomen and Pelvis 09/15/2024. FINDINGS: Portable AP supine views at 0829 hours. Stable enteric tube terminating in the proximal stomach. Bulky and calcified midline uterine fibroid redemonstrated in the pelvis. Abdominal skin staples remain in place. Bowel-gas pattern has improved since the CT scout view 09/15/2024, appears nonobstructed now. Stable lung bases. Aortoiliac calcified atherosclerosis. Stable visualized osseous structures. IMPRESSION: 1. Improved bowel-gas pattern since 09/15/2024, non-obstructed now. 2. Stable enteric tube terminating in the proximal stomach. 3. Bulky calcified midline uterine fibroid. Electronically Signed   By: VEAR Hurst M.D.   On: 09/17/2024 08:59  Medications:      acetaminophen   1,000 mg Oral Q8H   allopurinol   100 mg Per Tube Daily   Chlorhexidine  Gluconate Cloth  6 each Topical QHS   ezetimibe   10 mg Per Tube Daily   heparin   5,000 Units Subcutaneous Q8H   hydrOXYzine   50 mg Per Tube Daily   insulin  aspart  0-9 Units Subcutaneous Q4H   midodrine   5 mg Oral TID WC   pantoprazole  (PROTONIX ) IV  40 mg Intravenous Q24H   sertraline   25 mg Per Tube Daily   albuterol , ALPRAZolam , HYDROmorphone  (DILAUDID ) injection, methocarbamol  (ROBAXIN ) injection, ondansetron  (ZOFRAN ) IV, ondansetron , oxyCODONE , oxyCODONE , zolpidem   Assessment/ Plan:  Ms. Mike Berntsen is a 63 y.o.  female with past medical conditions including obstructive sleep apnea on CPAP, gout, GERD, type 2 diabetes, and end-stage renal disease on hemodialysis.  Patient presented to the hospital due to incarcerated and obstructed ventral hernia, experienced exploratory laparotomy on 9/13.  Patient is currently admitted for SBO (small bowel obstruction) (HCC) [K56.609] Hernia of abdominal wall [K43.9] Incarcerated ventral hernia [K43.6]  [facility]  [place]/MWF/right IJ PermCath/left upper AVF.   End-stage renal disease on hemodialysis.  Continue MWF schedule. Patient requesting to end treatment early due to not feeling well. Refused medications. Discussed with patient that she hasn't had a full treatment all week and encouraged her to complete this treatment. She continued to request to terminate. Signed AMA. Next treatment scheduled for Monday.   2. Acute respiratory failure, placed on Bipap in ICU.  Now weaned to nasal cannula.  Remains on 2 L/min  3. Anemia of chronic kidney disease Lab Results  Component Value Date   HGB 8.9 (L) 09/18/2024    Hemoglobin borderline.  Will continue to monitor for now.  4.  Incarcerated ventral hernia status post exploratory laparotomy on 9/13.  Wound VAC and NGT placed.  General surgery continues to follow and offer supportive measures and pain management. Surgery will advance diet to clears.   5. Secondary Hyperparathyroidism: with outpatient labs: PTH 993, phosphorus 4.9, calcium 9.5 on 09/07/24.   Lab Results  Component Value Date   PTH 651 (H) 05/24/2023   CALCIUM 9.3 09/18/2024   CAION 1.11 (L) 08/08/2023   PHOS 8.2 (H) 09/18/2024    Sevelamer  tablets were changed to powder-but we will hold for now until acute GI issues are resolved.   LOS: 5 Cecilio Ohlrich 9/19/202511:33 AM

## 2024-09-18 NOTE — Progress Notes (Signed)
 [facility] SURGICAL PROGRESS NOTE  Hospital Day(s): 5.   Post op day(s): 6 Days Post-Op.   Interval History:  Patient seen and examined No acute issues overnight She reports feeling well  No nausea or emesis Previous leukocytosis remains resolved; WBC 10.4 this AM Hgb to 8.9 BMP consistent with known ESRD NGT remains in place; output 700 ccs in last 24 hours - Assume this was needed overnight but she is unable to clarify this She has continued to have bowel function Prevena to midline   Vital signs in last 24 hours: [min-max] current  Temp:  [97.8 F (36.6 C)-99.3 F (37.4 C)] 99.3 F (37.4 C) (09/19 0351) Pulse Rate:  [86-96] 95 (09/19 0351) Resp:  [17-22] 17 (09/19 0351) BP: (90-114)/(53-71) 114/65 (09/19 0351) SpO2:  [95 %-100 %] 97 % (09/19 0351) FiO2 (%):  [28 %] 28 % (09/18 2327) Weight:  [81.2 kg] 81.2 kg (09/19 0500)     Height: 4' 9 (144.8 cm) Weight: 81.2 kg BMI (Calculated): 38.73   Intake/Output last 2 shifts:  09/18 0701 - 09/19 0700 In: 501.2 [IV Piggyback:501.2] Out: 700 [Emesis/NG output:700]   Physical Exam:  Constitutional: alert, cooperative and no distress  HEENT: NGT in place; clamped Respiratory: She is on [place], no respiratory distress Cardiovascular: normal rate and sinus rhythm  Gastrointestinal: soft, non-tender, and non-distended, no rebound/guarding. She does not appear overtly peritonitic.  Integumentary: Midline with Prevena in place; serosanguinous output   Labs:     Latest Ref Rng & Units 09/18/2024    6:10 AM 09/17/2024    4:55 AM 09/16/2024    3:27 AM  CBC  WBC 4.0 - 10.5 K/uL 10.4  9.1  7.9   Hemoglobin 12.0 - 15.0 g/dL 8.9  8.9  9.7   Hematocrit 36.0 - 46.0 % 27.6  28.3  30.0   Platelets 150 - 400 K/uL 196  211  198       Latest Ref Rng & Units 09/18/2024    6:10 AM 09/17/2024    4:55 AM 09/16/2024    3:27 AM  CMP  Glucose 70 - 99 mg/dL 84  98  892   BUN 8 - 23 mg/dL 97  74  73   Creatinine 0.44 - 1.00 mg/dL  1.43  2.95  2.47   Sodium 135 - 145 mmol/L 140  141  139   Potassium 3.5 - 5.1 mmol/L 4.8  4.4  4.5   Chloride 98 - 111 mmol/L 96  98  93   CO2 22 - 32 mmol/L 22  23  21    Calcium 8.9 - 10.3 mg/dL 9.3  9.2  9.2     Imaging studies:  No new imaging studies    Assessment/Plan:  63 y.o. female now with improved hemodynamics and resolving ileus 6 Days Post-Op s/p exploratory laparotomy, reduction of incarcerated ventral hernia, and placement of Prevena wound vac complicated by pertinent comorbidities including ESRD.    - We will cautiously trial CLD around the NGT (clamped or to gravity) as she is having bowel function; however, she may still have some degree of ileus. This is not the most ideal plan, but I do not want to pull the NGT prematurely. If she has issues with nausea, we can return the NGT to LIS. If no issues, hopefully this can be pulled tomorrow  - I would hold off on TPN for now.   - Monitor abdominal examination; on-going bowel function   - Monitor leukocytosis; resolved -  Pain control prn; avoid nephrotoxic agents  - Antiemetics prn             - Okay to mobilize; continue abdominal binder - engage therapies              - Appreciate nephrology assistance with ESRD, HD  - Remains okay to transfer to floor   All of the above findings and recommendations were discussed with the patient, and the medical team, and all of patient's questions were answered to her expressed satisfaction.  -- [name], PA-C [facility] 09/18/2024, 7:36 AM M-F: 7am - 4pm

## 2024-09-18 NOTE — Plan of Care (Signed)
  Problem: Clinical Measurements: Goal: Respiratory complications will improve Outcome: Progressing Goal: Cardiovascular complication will be avoided Outcome: Progressing   Problem: Activity: Goal: Risk for activity intolerance will decrease Outcome: Not Progressing   Problem: Nutrition: Goal: Adequate nutrition will be maintained Outcome: Not Progressing   Problem: Coping: Goal: Level of anxiety will decrease Outcome: Progressing

## 2024-09-18 NOTE — Progress Notes (Signed)
 Progress Note    Brianna [name]  FMW:[id number] DOB: 04/17/1961  DOA: 09/12/2024 PCP: Cecily Katz, PA-C      Brief Narrative:    Medical records reviewed and are as summarized below:  Brianna [name] is a 63 y.o. female with PMH type II DM, ESRD on hemodialysis, OSA on CPAP, chronic pain syndrome, chronic HFpEF, GERD, gout, who was admitted to the hospital for incarcerated abdominal hernia s/p exploratory laparotomy on 09/13/2024.  The hospitalist team was consulted because of tachycardia and respiratory distress. Patient was transferred to stepdown and temporarily placed on BiPAP, later able to wean to nasal cannula at 3 L.  No baseline oxygen use.  Patient also developed postsurgical ileus requiring NG tube placement for decompression.  9/17: Vital stable, blood pressure now improved, she did develop hypotension responded to IV fluid, does not require any pressors.  Started having bowel movement.  Surgery would like to keep NJ tube in for another day due to significant amount of secretions.  Going for dialysis today.  9/18: Hemodynamically stable, borderline soft blood pressure, started on NG tube clamping trial but still having significant secretions.  Small amount of clear liquid was also started.  9/19: Hemodynamically stable, had her dialysis but did not completed the full session, wound VAC was removed by general surgery, surgery is keeping NG tube for another day with clear liquid diet, having bowel movement and passing flatus.  Still having significant discharge with NG tube. PT is recommending SNF  Assessment/Plan:   Principal Problem:   Incarcerated ventral hernia Active Problems:   ESRD (end stage renal disease) (HCC)   Hernia of abdominal wall   Shock circulatory (HCC)   SBO (small bowel obstruction) (HCC)   Body mass index is 40.79 kg/m.-Class II obesity)  Incarcerated abdominal hernia:  Postoperative ileus. S/p exploratory laparotomy on  09/13/2024.  Repeat abdominal x-ray on 09/14/2024 showed dilated loops of bowel in the upper abdomen consistent with bowel obstruction. NG tube was placed with immediate output. She has been started on empiric IV antibiotics-completed the course Leukocytosis, improving Lactic acidosis: Improved Follow-up with general surgeon. Started having bowel movement but still having significant amount of secretions through NG tube so surgery would like to keep for another day.  She was started on clamping trial and clear liquid diet.  Hypotension: Borderline soft blood pressure this morning which improved with IV fluid -Started on low-dose midodrine  on 9/18 - Continue to monitor  Acute respiratory distress: Initially required BiPAP transiently and then wean to nasal cannula, currently saturating 100% on 2 L of oxygen, she uses CPAP at night. - Continue supplemental oxygen-wean as tolerated  Sinus tachycardia: Improved.  CTA chest did not show any evidence of pneumonia or pulmonary embolism.   ESRD: Follow-up with nephrologist for hemodialysis.  Chronic HFpEF: Compensated  Gout: Stable.  Decreased allopurinol  from 300 mg to 100 mg daily (renal dose)  Diet Order             Diet clear liquid Fluid consistency: Thin  Diet effective now                  Consultants: General Surgeon Nephrology  Procedures: Ex lap on 09/13/2024  Medications:    acetaminophen   1,000 mg Oral Q8H   allopurinol   100 mg Per Tube Daily   Chlorhexidine  Gluconate Cloth  6 each Topical QHS   ezetimibe   10 mg Per Tube Daily   feeding supplement  1 Container Oral TID  BM   heparin   5,000 Units Subcutaneous Q8H   hydrOXYzine   50 mg Per Tube Daily   insulin  aspart  0-9 Units Subcutaneous Q4H   midodrine   5 mg Oral TID WC   multivitamin  1 tablet Per Tube QHS   [START ON 09/19/2024] multivitamin with minerals  1 tablet Oral Daily   pantoprazole  (PROTONIX ) IV  40 mg Intravenous Q24H   sertraline   25 mg Per Tube  Daily   [START ON 09/19/2024] thiamine   100 mg Oral Daily   Continuous Infusions:  Anti-infectives (From admission, onward)    Start     Dose/Rate Route Frequency Ordered Stop   09/16/24 1200  vancomycin  (VANCOCIN ) IVPB 1000 mg/200 mL premix  Status:  Discontinued        1,000 mg 200 mL/hr over 60 Minutes Intravenous Every M-W-F (Hemodialysis) 09/15/24 0309 09/16/24 1359   09/15/24 0400  metroNIDAZOLE  (FLAGYL ) IVPB 500 mg  Status:  Discontinued        500 mg 100 mL/hr over 60 Minutes Intravenous Every 12 hours 09/15/24 0255 09/16/24 1359   09/15/24 0400  vancomycin  (VANCOREADY) IVPB 2000 mg/400 mL        2,000 mg 200 mL/hr over 120 Minutes Intravenous  Once 09/15/24 0300 09/15/24 0737   09/15/24 0345  vancomycin  (VANCOCIN ) IVPB 1000 mg/200 mL premix  Status:  Discontinued        1,000 mg 200 mL/hr over 60 Minutes Intravenous  Once 09/15/24 0255 09/15/24 0300   09/15/24 0330  cefTRIAXone  (ROCEPHIN ) 2 g in sodium chloride  0.9 % 100 mL IVPB  Status:  Discontinued        2 g 200 mL/hr over 30 Minutes Intravenous Daily 09/15/24 0255 09/16/24 1359   09/13/24 0800  ceFAZolin  (ANCEF ) IVPB 1 g/50 mL premix  Status:  Discontinued        1 g 100 mL/hr over 30 Minutes Intravenous Every 8 hours 09/13/24 0241 09/13/24 1301   09/12/24 2015  ceFAZolin  (ANCEF ) IVPB 2g/100 mL premix        2 g 200 mL/hr over 30 Minutes Intravenous  Once 09/12/24 1958 09/13/24 0031       Family Communication/Anticipated D/C date and plan/Code Status   DVT prophylaxis: heparin  injection 5,000 Units Start: 09/13/24 0945 SCDs Start: 09/13/24 0242     Code Status: Full Code  Family Communication: Primary team is communicating with family. Disposition Plan: PT is recommending SNF  Subjective:  Patient was sitting in chair and was seen after the dialysis today.  NG tube was in place, having bowel movement and passing flatus.  Objective:    Vitals:   09/18/24 0900 09/18/24 0930 09/18/24 0949 09/18/24 1120   BP: 90/64 (!) 101/57 107/63 (!) 97/54  Pulse: 85 86 80 85  Resp: 20 19 19 20   Temp:   98.7 F (37.1 C) 98.5 F (36.9 C)  TempSrc:   Oral Oral  SpO2: 99% 100% 98% 97%  Weight:      Height:       No data found.   Intake/Output Summary (Last 24 hours) at 09/18/2024 1502 Last data filed at 09/18/2024 1105 Gross per 24 hour  Intake 586.22 ml  Output 425 ml  Net 161.22 ml   Filed Weights   09/17/24 0310 09/18/24 0500 09/18/24 0755  Weight: 80.7 kg 81.2 kg 85.5 kg    Exam: General.  Ill-appearing lady, in no acute distress. Pulmonary.  Lungs clear bilaterally, normal respiratory effort. CV.  Regular rate and rhythm, no  JVD, rub or murmur. Abdomen.  Soft, nontender, nondistended, BS positive. CNS.  Alert and oriented .  No focal neurologic deficit. Extremities.  No edema, no cyanosis, pulses intact and symmetrical.   Data Reviewed:   I have personally reviewed following labs and imaging studies:  Labs: Labs show the following:   Basic Metabolic Panel: Recent Labs  Lab 09/14/24 1915 09/15/24 0311 09/16/24 0327 09/17/24 0455 09/18/24 0610  NA 140 140 139 141 140  K 4.6 4.5 4.5 4.4 4.8  CL 96* 95* 93* 98 96*  CO2 17* 20* 21* 23 22  GLUCOSE 202* 175* 107* 98 84  BUN 87* 99* 73* 74* 97*  CREATININE 9.65* 10.15* 7.52* 7.04* 8.56*  CALCIUM 10.2 9.8 9.2 9.2 9.3  MG  --   --  2.1  --   --   PHOS  --  8.2* 7.0* 8.2* 8.2*   GFR Estimated Creatinine Clearance: 6.2 mL/min (A) (by C-G formula based on SCr of 8.56 mg/dL (H)). Liver Function Tests: Recent Labs  Lab 09/12/24 1456 09/15/24 0311 09/16/24 0327 09/17/24 0455 09/18/24 0610  AST 23 50*  --   --   --   ALT 15 <5  --   --   --   ALKPHOS 114 106  --   --   --   BILITOT 0.8 0.9  --   --   --   PROT 8.7* 7.2  --   --   --   ALBUMIN  4.3 3.1* 3.0* 3.4* 3.1*   Recent Labs  Lab 09/12/24 1456  LIPASE 32   No results for input(s): AMMONIA in the last 168 hours. Coagulation profile Recent Labs  Lab  09/15/24 0311  INR 2.1*    CBC: Recent Labs  Lab 09/14/24 0429 09/15/24 0311 09/16/24 0327 09/17/24 0455 09/18/24 0610  WBC 12.9* 11.5* 7.9 9.1 10.4  NEUTROABS 10.2* 9.0*  --   --   --   HGB 11.9* 10.1* 9.7* 8.9* 8.9*  HCT 37.7 30.9* 30.0* 28.3* 27.6*  MCV 93.8 92.2 92.6 94.6 93.9  PLT 252 243 198 211 196   Cardiac Enzymes: No results for input(s): CKTOTAL, CKMB, CKMBINDEX, TROPONINI in the last 168 hours. BNP (last 3 results) No results for input(s): PROBNP in the last 8760 hours. CBG: Recent Labs  Lab 09/17/24 1554 09/17/24 2040 09/17/24 2320 09/18/24 0353 09/18/24 1207  GLUCAP 89 97 96 86 80   D-Dimer: No results for input(s): DDIMER in the last 72 hours. Hgb A1c: No results for input(s): HGBA1C in the last 72 hours.  Lipid Profile: No results for input(s): CHOL, HDL, LDLCALC, TRIG, CHOLHDL, LDLDIRECT in the last 72 hours. Thyroid function studies: No results for input(s): TSH, T4TOTAL, T3FREE, THYROIDAB in the last 72 hours.  Invalid input(s): FREET3  Anemia work up: No results for input(s): VITAMINB12, FOLATE, FERRITIN, TIBC, IRON, RETICCTPCT in the last 72 hours. Sepsis Labs: Recent Labs  Lab 09/15/24 0311 09/15/24 0619 09/15/24 1615 09/15/24 1859 09/16/24 0327 09/17/24 0455 09/18/24 0610  WBC 11.5*  --   --   --  7.9 9.1 10.4  LATICACIDVEN 3.6* 1.4 1.0 1.0  --   --   --     Microbiology Recent Results (from the past 240 hours)  Culture, blood (x 2)     Status: None (Preliminary result)   Collection Time: 09/15/24  3:11 AM   Specimen: BLOOD RIGHT HAND  Result Value Ref Range Status   Specimen Description BLOOD RIGHT HAND  Final  Special Requests   Final    BOTTLES DRAWN AEROBIC ONLY Blood Culture adequate volume   Culture   Final    NO GROWTH 3 DAYS Performed at [facility], [address] [place], [place] [address]    Report Status PENDING  Incomplete  Culture, blood (x 2)      Status: None (Preliminary result)   Collection Time: 09/15/24  3:11 AM   Specimen: BLOOD RIGHT ARM  Result Value Ref Range Status   Specimen Description BLOOD RIGHT ARM  Final   Special Requests   Final    BOTTLES DRAWN AEROBIC ONLY Blood Culture adequate volume   Culture   Final    NO GROWTH 3 DAYS Performed at [facility], [address] [place], [place] [address]    Report Status PENDING  Incomplete  MRSA Next Gen by PCR, Nasal     Status: None   Collection Time: 09/15/24  4:16 AM   Specimen: Nasal Mucosa; Nasal Swab  Result Value Ref Range Status   MRSA by PCR Next Gen NOT DETECTED NOT DETECTED Final    Comment: (NOTE) The GeneXpert MRSA Assay (FDA approved for NASAL specimens only), is one component of a comprehensive MRSA colonization surveillance program. It is not intended to diagnose MRSA infection nor to guide or monitor treatment for MRSA infections. Test performance is not FDA approved in patients less than 22 years old. Performed at [facility], [address] [place], [place] [address]     Procedures and diagnostic studies:  DG Abd 1 View Result Date: 09/17/2024 CLINICAL DATA:  63 year old female with incarcerated ventral hernia, obstruction. Status post exploratory laparotomy, reduced hernia, repair of hernia, lysis of adhesions, omentectomy. Postoperative day 4. Ileus. EXAM: ABDOMEN - 1 VIEW COMPARISON:  Postoperative CT Abdomen and Pelvis 09/15/2024. FINDINGS: Portable AP supine views at 0829 hours. Stable enteric tube terminating in the proximal stomach. Bulky and calcified midline uterine fibroid redemonstrated in the pelvis. Abdominal skin staples remain in place. Bowel-gas pattern has improved since the CT scout view 09/15/2024, appears nonobstructed now. Stable lung bases. Aortoiliac calcified atherosclerosis. Stable visualized osseous structures. IMPRESSION: 1. Improved bowel-gas pattern since 09/15/2024, non-obstructed now. 2. Stable  enteric tube terminating in the proximal stomach. 3. Bulky calcified midline uterine fibroid. Electronically Signed   By: VEAR Hurst M.D.   On: 09/17/2024 08:59     LOS: 5 days   Amaryllis Dare, MD  [facility]   Pager on [email or web address]. If 7PM-7AM, please contact night-coverage at [email or web address]   09/18/2024, 3:02 PM

## 2024-09-18 NOTE — NC FL2 (Signed)
 [place]  MEDICAID [id number] LEVEL OF CARE FORM     IDENTIFICATION  Patient Name: Keylie Beavers Birthdate: 08/23/1961 Sex: female Admission Date (Current Location): 09/12/2024  [facility] and [place] Number:  Chiropodist and Address:  [facility], [address], [place], [place] [address]      Provider Number: [id number]  Attending Physician Name and Address:  Caleen Qualia, MD  Relative Name and Phone Number:       Current Level of Care: Hospital Recommended Level of Care: Skilled Nursing Facility Prior Approval Number:    Date Approved/Denied:   PASRR Number: Manual review  Discharge Plan: SNF    Current Diagnoses: Patient Active Problem List   Diagnosis Date Noted   Shock circulatory (HCC) 09/15/2024   SBO (small bowel obstruction) (HCC) 09/15/2024   Incarcerated ventral hernia 09/13/2024   Hernia of abdominal wall 09/13/2024   Bilateral chronic knee pain 07/23/2024   Chronic pain syndrome 07/14/2024   Chronic painful diabetic neuropathy (HCC) 07/14/2024   Anxiety and depression 07/14/2024   Morbidly obese (HCC) 05/29/2023   Acute on chronic diastolic CHF (congestive heart failure) (HCC) 05/29/2023   Anemia of chronic kidney failure 05/29/2023   Acute on chronic congestive heart failure (HCC) 05/24/2023   Anasarca 05/23/2023   (HFpEF) heart failure with preserved ejection fraction (HCC) 05/23/2023   ESRD (end stage renal disease) (HCC) 05/23/2023   Dyspnea 01/18/2021   Bilateral lower extremity edema 01/18/2021   Volume overload 01/18/2021   Bronchopneumonia 01/12/2021   Type II diabetes mellitus with renal manifestations (HCC) 01/12/2021   Acid reflux    Asthma    Anemia due to stage 4 chronic kidney disease (HCC)    Hypokalemia    Type 2 diabetes mellitus without complication, with long-term current use of insulin  (HCC)    Acute renal failure superimposed on stage 4 chronic kidney disease (HCC)    Hypertension     Strangulated ventral hernia 02/09/2020    Orientation RESPIRATION BLADDER Height & Weight     Self, Time, Situation, Place  O2 (Nasal Cannula 2 L. Has CPAP at home.) Continent Weight: 188 lb 7.9 oz (85.5 kg) Height:  4' 9 (144.8 cm)  BEHAVIORAL SYMPTOMS/MOOD NEUROLOGICAL BOWEL NUTRITION STATUS   (None)  (None) Continent  (Currently with NG. Likely remove tomorrow. See discharge summary once available for diet recommendation.)  AMBULATORY STATUS COMMUNICATION OF NEEDS Skin   Extensive Assist Verbally Surgical wounds (Incision on abdomen. Prevena vac removed 9/19. RN said staples look dry. She does have an abdominal binder on.)                       Personal Care Assistance Level of Assistance  Bathing, Feeding, Dressing Bathing Assistance: Maximum assistance Feeding assistance: Limited assistance Dressing Assistance: Maximum assistance     Functional Limitations Info  Sight, Hearing, Speech Sight Info: Adequate Hearing Info: Adequate Speech Info: Adequate    SPECIAL CARE FACTORS FREQUENCY  PT (By licensed PT), OT (By licensed OT)     PT Frequency: 5 x week OT Frequency: 5 x week            Contractures Contractures Info: Not present    Additional Factors Info  Code Status, Allergies Code Status Info: Full code Allergies Info: Ace Inhibitors, Lisinopril, Lipitor (Atorvastatin).           Current Medications (09/18/2024):  This is the current hospital active medication list Current Facility-Administered Medications  Medication Dose Route  Frequency Provider Last Rate Last Admin   acetaminophen  (TYLENOL ) tablet 1,000 mg  1,000 mg Oral Q8H Marinda Jayson KIDD, MD   1,000 mg at 09/18/24 1049   albuterol  (PROVENTIL ) (2.5 MG/3ML) 0.083% nebulizer solution 2.5 mg  2.5 mg Nebulization Q4H PRN Marinda Jayson KIDD, MD   2.5 mg at 09/14/24 1828   allopurinol  (ZYLOPRIM ) tablet 100 mg  100 mg Per Tube Daily Tobie Ransom HERO, RPH   100 mg at 09/18/24 1042   ALPRAZolam  (XANAX )  tablet 0.25 mg  0.25 mg Per Tube BID PRN Tobie Ransom M, RPH       Chlorhexidine  Gluconate Cloth 2 % PADS 6 each  6 each Topical QHS Amin, Sumayya, MD   6 each at 09/17/24 2000   ezetimibe  (ZETIA ) tablet 10 mg  10 mg Per Tube Daily Tobie Ransom HERO, RPH   10 mg at 09/18/24 1043   heparin  injection 5,000 Units  5,000 Units Subcutaneous Q8H Marinda Jayson KIDD, MD   5,000 Units at 09/18/24 1044   HYDROmorphone  (DILAUDID ) injection 0.5 mg  0.5 mg Intravenous Q2H PRN Marinda Jayson KIDD, MD   0.5 mg at 09/18/24 1046   hydrOXYzine  (ATARAX ) tablet 50 mg  50 mg Per Tube Daily Tobie Ransom HERO, RPH   50 mg at 09/18/24 1043   insulin  aspart (novoLOG ) injection 0-9 Units  0-9 Units Subcutaneous Q4H Ponnala, Shruthi, MD   1 Units at 09/16/24 1202   methocarbamol  (ROBAXIN ) injection 500 mg  500 mg Intravenous Q8H PRN Marinda Jayson KIDD, MD       midodrine  (PROAMATINE ) tablet 5 mg  5 mg Oral TID WC Breeze, Faith, NP   5 mg at 09/18/24 0732   ondansetron  (ZOFRAN ) injection 4 mg  4 mg Intravenous Q4H PRN Marinda Jayson KIDD, MD   4 mg at 09/14/24 2218   ondansetron  (ZOFRAN -ODT) disintegrating tablet 4 mg  4 mg Oral Q8H PRN Schulz, Zachary R, PA-C   4 mg at 09/14/24 1525   oxyCODONE  (Oxy IR/ROXICODONE ) immediate release tablet 10 mg  10 mg Oral Q4H PRN Marinda Jayson KIDD, MD   10 mg at 09/18/24 1042   oxyCODONE  (Oxy IR/ROXICODONE ) immediate release tablet 5 mg  5 mg Oral Q4H PRN Marinda Jayson KIDD, MD       pantoprazole  (PROTONIX ) injection 40 mg  40 mg Intravenous Q24H Marinda Jayson KIDD, MD   40 mg at 09/18/24 1043   sertraline  (ZOLOFT ) tablet 25 mg  25 mg Per Tube Daily Tobie Ransom HERO, RPH   25 mg at 09/18/24 1043   zolpidem  (AMBIEN ) tablet 5 mg  5 mg Per Tube QHS PRN Patel, Rutvi M, RPH         Discharge Medications: Please see discharge summary for a list of discharge medications.  Relevant Imaging Results:  Relevant Lab Results:   Additional Information SS#: [id number]. HD [facility] MWF 5:30 am.  Lauraine JAYSON Carpen, LCSW

## 2024-09-18 NOTE — Progress Notes (Signed)
 Mobility Specialist - Progress Note   09/18/24 1102  Mobility  Activity Pivoted/transferred from bed to chair  Level of Assistance Minimal assist, patient does 75% or more  Assistive Device Front wheel walker  Distance Ambulated (ft) 4 ft  Activity Response Tolerated well  Mobility visit 1 Mobility  Mobility Specialist Start Time (ACUTE ONLY) 1038  Mobility Specialist Stop Time (ACUTE ONLY) 1052  Mobility Specialist Time Calculation (min) (ACUTE ONLY) 14 min   Pt semi fowler upon entry, utilizing [place]. Pt completed bed mob HHA to bring trunk from sup to sit, MinA to bring BLE EOB. Pt STS to RW CGA +2 for safety, transferred to the recliner via SPT MinA +2. Pt left seated w/ alarm set and needs within reach. RN present at bedside.  America Silvan Mobility Specialist 09/18/24 11:16 AM

## 2024-09-18 NOTE — Progress Notes (Signed)
  Received patient in bed to unit.   Informed consent signed and in chart.    TX duration: 1.30hrs      Transported back to floor  Hand-off given to patient's nurse  pt  c/o h/a and talking with NP, pt is adament that she wants to come off the machine and end her tx.   Ama signed  Dressing changed    Access used: R HD Catheter  Access issues: none   Total UF removed: 0 Medication(s) given: none Post HD VS: wnl     Olivia Hurst LPN [facility]

## 2024-09-19 DIAGNOSIS — N186 End stage renal disease: Secondary | ICD-10-CM | POA: Diagnosis not present

## 2024-09-19 DIAGNOSIS — K439 Ventral hernia without obstruction or gangrene: Secondary | ICD-10-CM | POA: Diagnosis not present

## 2024-09-19 DIAGNOSIS — K56609 Unspecified intestinal obstruction, unspecified as to partial versus complete obstruction: Secondary | ICD-10-CM | POA: Diagnosis not present

## 2024-09-19 DIAGNOSIS — K436 Other and unspecified ventral hernia with obstruction, without gangrene: Secondary | ICD-10-CM | POA: Diagnosis not present

## 2024-09-19 LAB — MAGNESIUM: Magnesium: 2.1 mg/dL (ref 1.7–2.4)

## 2024-09-19 LAB — GLUCOSE, CAPILLARY
Glucose-Capillary: 102 mg/dL — ABNORMAL HIGH (ref 70–99)
Glucose-Capillary: 111 mg/dL — ABNORMAL HIGH (ref 70–99)
Glucose-Capillary: 114 mg/dL — ABNORMAL HIGH (ref 70–99)
Glucose-Capillary: 121 mg/dL — ABNORMAL HIGH (ref 70–99)
Glucose-Capillary: 72 mg/dL (ref 70–99)
Glucose-Capillary: 85 mg/dL (ref 70–99)
Glucose-Capillary: 90 mg/dL (ref 70–99)

## 2024-09-19 LAB — BASIC METABOLIC PANEL WITH GFR
Anion gap: 19 — ABNORMAL HIGH (ref 5–15)
BUN: 72 mg/dL — ABNORMAL HIGH (ref 8–23)
CO2: 23 mmol/L (ref 22–32)
Calcium: 9.4 mg/dL (ref 8.9–10.3)
Chloride: 95 mmol/L — ABNORMAL LOW (ref 98–111)
Creatinine, Ser: 7.22 mg/dL — ABNORMAL HIGH (ref 0.44–1.00)
GFR, Estimated: 6 mL/min — ABNORMAL LOW (ref >60)
Glucose, Bld: 79 mg/dL (ref 70–99)
Potassium: 4.5 mmol/L (ref 3.5–5.1)
Sodium: 137 mmol/L (ref 135–145)

## 2024-09-19 LAB — PHOSPHORUS: Phosphorus: 8.2 mg/dL — ABNORMAL HIGH (ref 2.5–4.6)

## 2024-09-19 MED ORDER — HYDROXYZINE HCL 25 MG PO TABS
50.0000 mg | ORAL_TABLET | Freq: Every day | ORAL | Status: DC
  Administered 2024-09-19 – 2024-09-24 (×6): 50 mg via ORAL
  Filled 2024-09-19 (×5): qty 2

## 2024-09-19 MED ORDER — ZOLPIDEM TARTRATE 5 MG PO TABS
5.0000 mg | ORAL_TABLET | Freq: Every evening | ORAL | Status: DC | PRN
  Administered 2024-09-21: 5 mg via ORAL
  Filled 2024-09-19: qty 1

## 2024-09-19 MED ORDER — EZETIMIBE 10 MG PO TABS
10.0000 mg | ORAL_TABLET | Freq: Every day | ORAL | Status: DC
Start: 2024-09-19 — End: 2024-09-24
  Administered 2024-09-19 – 2024-09-24 (×6): 10 mg via ORAL
  Filled 2024-09-19 (×6): qty 1

## 2024-09-19 MED ORDER — RENA-VITE PO TABS
1.0000 | ORAL_TABLET | Freq: Every day | ORAL | Status: DC
  Administered 2024-09-19 – 2024-09-23 (×5): 1 via ORAL
  Filled 2024-09-19 (×5): qty 1

## 2024-09-19 MED ORDER — SERTRALINE HCL 50 MG PO TABS
25.0000 mg | ORAL_TABLET | Freq: Every day | ORAL | Status: DC
  Administered 2024-09-19 – 2024-09-24 (×6): 25 mg via ORAL
  Filled 2024-09-19 (×5): qty 1

## 2024-09-19 MED ORDER — ALLOPURINOL 100 MG PO TABS
100.0000 mg | ORAL_TABLET | Freq: Every day | ORAL | Status: DC
  Administered 2024-09-19 – 2024-09-24 (×6): 100 mg via ORAL
  Filled 2024-09-19 (×5): qty 1

## 2024-09-19 MED ORDER — ALPRAZOLAM 0.25 MG PO TABS: 0.2500 mg | ORAL_TABLET | Freq: Two times a day (BID) | ORAL | Status: DC | PRN

## 2024-09-19 NOTE — Progress Notes (Signed)
 Physical Therapy Treatment Patient Details Name: Brianna [name] MRN: [id number] DOB: 06-05-1961 Today's Date: 09/19/2024   History of Present Illness Pt is a 63 year old female she underwent exploratory laparotomy with Dr. Marinda in general surgery on 09/12/24 for an incarcerated ventral hernia. Her post-op hospital course was complicated by persistent tachycardia, respiratory distress requiring BiPAP support, hypotension and her ESRD. She remains on BiPAP this morning and BP is overall improving without vasopressor support.    PMH significant for ESRD on dialysis, HFpEF, DM type II, and OSA on CPAP    PT Comments  Pt received in bed, NGT discontinued. Pt agreeable to PT session. Abd Binder in place to protect wound. ModA to transfer to EOB with cues for log roll for comfort. Good static sitting balance, 97% on 2L O2. Pt able to stand from bed with MinA (ModA from recliner) and advance a few steps with RW to bedside chair and MinA for safety. No buckling or dizziness, pt states she uses a Rollator at baseline. Pt positioned to comfort in upright sitting. Overall, great tolerance for OOB activity, no c/o abdominal discomfort. Will plan to continue to progress acutely per PT POC.   If plan is discharge home, recommend the following: A lot of help with bathing/dressing/bathroom;Two people to help with walking and/or transfers;Assist for transportation;Help with stairs or ramp for entrance   Can travel by private vehicle     No  Equipment Recommendations  Rolling walker (2 wheels);Other (comment) (YOUTH RW)    Recommendations for Other Services       Precautions / Restrictions Precautions Precautions: Fall Recall of Precautions/Restrictions: Intact Precaution/Restrictions Comments: abdominal wound vac Restrictions Weight Bearing Restrictions Per Provider Order: No     Mobility  Bed Mobility Overal bed mobility: Needs Assistance Bed Mobility: Rolling, Supine to Sit Rolling: Mod  assist   Supine to sit: Mod assist     General bed mobility comments: verbal cues for logrolling for comfort. cues for technique. abdominal binder in place before mobilizing    Transfers Overall transfer level: Needs assistance Equipment used: Rolling walker (2 wheels) Transfers: Sit to/from Stand Sit to Stand: Mod assist           General transfer comment:  (ModA to stand from bed and chair with cues for technique)    Ambulation/Gait Ambulation/Gait assistance: Min assist Gait Distance (Feet): 4 Feet Assistive device: Rolling walker (2 wheels) Gait Pattern/deviations: Step-to pattern, Shuffle Gait velocity: decr     General Gait Details:  (Pt only able to tolerate very short distance gait in room this date)   Stairs             Wheelchair Mobility     Tilt Bed    Modified Rankin (Stroke Patients Only)       Balance Overall balance assessment: Needs assistance Sitting-balance support: Bilateral upper extremity supported, Feet supported Sitting balance-Leahy Scale: Fair Sitting balance - Comments: Pt able to maintain seated balance at EOB   Standing balance support: Bilateral upper extremity supported, During functional activity, Reliant on assistive device for balance Standing balance-Leahy Scale: Fair Standing balance comment: using rolling walker for support in standing                            Communication Communication Communication: No apparent difficulties  Cognition Arousal: Alert Behavior During Therapy: WFL for tasks assessed/performed, Flat affect   PT - Cognitive impairments: No apparent impairments  PT - Cognition Comments: Pleasant and agreeable to PT session. Following commands: Impaired Following commands impaired: Follows one step commands with increased time    Cueing Cueing Techniques: Verbal cues, Tactile cues  Exercises Other Exercises Other Exercises: edu re role of PT, role  of rehab, importance of continued mobilty attempts    General Comments General comments (skin integrity, edema, etc.):  (slight bloody drainage noted from lower abdominal dressing, nursing notified and in to assess)      Pertinent Vitals/Pain Pain Assessment Pain Assessment: No/denies pain    Home Living                          Prior Function            PT Goals (current goals can now be found in the care plan section) Acute Rehab PT Goals Patient Stated Goal: to feel better Progress towards PT goals: Progressing toward goals    Frequency    Min 2X/week      PT Plan      Co-evaluation              AM-PAC PT 6 Clicks Mobility   Outcome Measure  Help needed turning from your back to your side while in a flat bed without using bedrails?: A Little Help needed moving from lying on your back to sitting on the side of a flat bed without using bedrails?: A Lot Help needed moving to and from a bed to a chair (including a wheelchair)?: A Lot Help needed standing up from a chair using your arms (e.g., wheelchair or bedside chair)?: A Lot Help needed to walk in hospital room?: A Lot Help needed climbing 3-5 steps with a railing? : A Lot 6 Click Score: 13    End of Session Equipment Utilized During Treatment: Oxygen Activity Tolerance: Patient tolerated treatment well;Patient limited by fatigue Patient left: in chair;with call bell/phone within reach;with chair alarm set Nurse Communication: Mobility status PT Visit Diagnosis: Unsteadiness on feet (R26.81);Muscle weakness (generalized) (M62.81);History of falling (Z91.81);Difficulty in walking, not elsewhere classified (R26.2)     Time: 0950-1009 PT Time Calculation (min) (ACUTE ONLY): 19 min  Charges:    $Therapeutic Activity: 8-22 mins PT General Charges $$ ACUTE PT VISIT: 1 Visit                    Darice Bohr, PTA  Darice JAYSON Bohr 09/19/2024, 12:19 PM

## 2024-09-19 NOTE — Progress Notes (Signed)
 [facility]  ROUNDING NOTE   Subjective:  Brianna [name] is a 63 year old female with past medical conditions including obstructive sleep apnea on CPAP, gout, GERD, type 2 diabetes, and end-stage renal disease on hemodialysis.  Patient presented to the hospital due to incarcerated and obstructed ventral hernia, experienced exploratory laparotomy on 9/13.  Patient is currently admitted for SBO (small bowel obstruction) (HCC) [K56.609] Hernia of abdominal wall [K43.9] Incarcerated ventral hernia [K43.6]   Postoperatively patient found to be tachycardic and requiring supplemental oxygen, 3 L.  Patient is known to our practice and receives outpatient dialysis treatments at [facility] on a MWF schedule, supervised by Dr. Marcelino. Updates today, patient resting.  Patient endorses pain and discomfort.  Repositioning patient.  Patient endorses poor appetite on liquid diet.  Encouraged p.o. intake.  Patient reports bowel movement yesterday.    Objective:  Vital signs in last 24 hours:  Temp:  [97.9 F (36.6 C)-98.6 F (37 C)] 98 F (36.7 C) (09/20 0840) Pulse Rate:  [77-80] 80 (09/20 0840) Resp:  [16-20] 16 (09/20 0840) BP: (100-133)/(57-88) 109/88 (09/20 0840) SpO2:  [98 %-100 %] 98 % (09/20 0840)  Weight change: 4.3 kg Filed Weights   09/17/24 0310 09/18/24 0500 09/18/24 0755  Weight: 80.7 kg 81.2 kg 85.5 kg    Intake/Output: I/O last 3 completed shifts: In: 205 [P.O.:120; NG/GT:85] Out: 435 [Emesis/NG output:435]   Intake/Output this shift:  Total I/O In: 240 [P.O.:240] Out: -   Physical Exam: General: NAD  Head: Normocephalic  Eyes: Anicteric  Neck: Supple  Lungs:  Diminished, on 2 L nasal cannula  Heart: Regular rate  Abdomen:  Tender and wrapped with gauze  Extremities:  none peripheral edema.  Neurologic: Alert  Skin: Abdominal wound  Access: Right chest PermCath, maturing left AV fistula    Basic Metabolic Panel: Recent Labs  Lab  09/15/24 0311 09/16/24 0327 09/17/24 0455 09/18/24 0610 09/19/24 0730  NA 140 139 141 140 137  K 4.5 4.5 4.4 4.8 4.5  CL 95* 93* 98 96* 95*  CO2 20* 21* 23 22 23   GLUCOSE 175* 107* 98 84 79  BUN 99* 73* 74* 97* 72*  CREATININE 10.15* 7.52* 7.04* 8.56* 7.22*  CALCIUM 9.8 9.2 9.2 9.3 9.4  MG  --  2.1  --   --  2.1  PHOS 8.2* 7.0* 8.2* 8.2* 8.2*    Liver Function Tests: Recent Labs  Lab 09/12/24 1456 09/15/24 0311 09/16/24 0327 09/17/24 0455 09/18/24 0610  AST 23 50*  --   --   --   ALT 15 <5  --   --   --   ALKPHOS 114 106  --   --   --   BILITOT 0.8 0.9  --   --   --   PROT 8.7* 7.2  --   --   --   ALBUMIN  4.3 3.1* 3.0* 3.4* 3.1*   Recent Labs  Lab 09/12/24 1456  LIPASE 32   No results for input(s): AMMONIA in the last 168 hours.  CBC: Recent Labs  Lab 09/14/24 0429 09/15/24 0311 09/16/24 0327 09/17/24 0455 09/18/24 0610  WBC 12.9* 11.5* 7.9 9.1 10.4  NEUTROABS 10.2* 9.0*  --   --   --   HGB 11.9* 10.1* 9.7* 8.9* 8.9*  HCT 37.7 30.9* 30.0* 28.3* 27.6*  MCV 93.8 92.2 92.6 94.6 93.9  PLT 252 243 198 211 196    Cardiac Enzymes: No results for input(s): CKTOTAL, CKMB, CKMBINDEX, TROPONINI in  the last 168 hours.  BNP: Invalid input(s): POCBNP  CBG: Recent Labs  Lab 09/18/24 2132 09/19/24 0034 09/19/24 0449 09/19/24 0814 09/19/24 1132  GLUCAP 86 90 102* 85 111*    Microbiology: Results for orders placed or performed during the hospital encounter of 09/12/24  Culture, blood (x 2)     Status: None (Preliminary result)   Collection Time: 09/15/24  3:11 AM   Specimen: BLOOD RIGHT HAND  Result Value Ref Range Status   Specimen Description BLOOD RIGHT HAND  Final   Special Requests   Final    BOTTLES DRAWN AEROBIC ONLY Blood Culture adequate volume   Culture   Final    NO GROWTH 4 DAYS Performed at [facility], [address], [place], [place] [address]    Report Status PENDING  Incomplete  Culture, blood (x 2)      Status: None (Preliminary result)   Collection Time: 09/15/24  3:11 AM   Specimen: BLOOD RIGHT ARM  Result Value Ref Range Status   Specimen Description BLOOD RIGHT ARM  Final   Special Requests   Final    BOTTLES DRAWN AEROBIC ONLY Blood Culture adequate volume   Culture   Final    NO GROWTH 4 DAYS Performed at [facility], [address] [place], [place] [address]    Report Status PENDING  Incomplete  MRSA Next Gen by PCR, Nasal     Status: None   Collection Time: 09/15/24  4:16 AM   Specimen: Nasal Mucosa; Nasal Swab  Result Value Ref Range Status   MRSA by PCR Next Gen NOT DETECTED NOT DETECTED Final    Comment: (NOTE) The GeneXpert MRSA Assay (FDA approved for NASAL specimens only), is one component of a comprehensive MRSA colonization surveillance program. It is not intended to diagnose MRSA infection nor to guide or monitor treatment for MRSA infections. Test performance is not FDA approved in patients less than 32 years old. Performed at [facility], [address] [place], [place] [address]     Coagulation Studies: No results for input(s): LABPROT, INR in the last 72 hours.  Urinalysis: No results for input(s): COLORURINE, LABSPEC, PHURINE, GLUCOSEU, HGBUR, BILIRUBINUR, KETONESUR, PROTEINUR, UROBILINOGEN, NITRITE, LEUKOCYTESUR in the last 72 hours.  Invalid input(s): APPERANCEUR    Imaging: No results found.   Medications:     acetaminophen   1,000 mg Oral Q8H   allopurinol   100 mg Oral Daily   Chlorhexidine  Gluconate Cloth  6 each Topical QHS   ezetimibe   10 mg Oral Daily   feeding supplement  1 Container Oral TID BM   heparin   5,000 Units Subcutaneous Q8H   hydrOXYzine   50 mg Oral Daily   insulin  aspart  0-9 Units Subcutaneous Q4H   midodrine   5 mg Oral TID WC   multivitamin  1 tablet Oral QHS   multivitamin with minerals  1 tablet Oral Daily   pantoprazole  (PROTONIX ) IV  40 mg Intravenous Q24H    sertraline   25 mg Oral Daily   thiamine   100 mg Oral Daily   albuterol , ALPRAZolam , HYDROmorphone  (DILAUDID ) injection, methocarbamol  (ROBAXIN ) injection, ondansetron  (ZOFRAN ) IV, ondansetron , oxyCODONE , oxyCODONE , zolpidem   Assessment/ Plan:  [name] is a 63 y.o.  female  with past medical conditions including obstructive sleep apnea on CPAP, gout, GERD, type 2 diabetes, and end-stage renal disease on hemodialysis.  Patient presented to the hospital due to incarcerated and obstructed ventral hernia, experienced exploratory laparotomy on 9/13.  Patient is  currently admitted for SBO (small bowel obstruction) (HCC) [K56.609] Hernia of abdominal wall [K43.9] Incarcerated ventral hernia [K43.6]   [facility]/MWF/right IJ PermCath/left upper AVF.    End-stage renal disease on hemodialysis.  Continue MWF schedule.Signed AMA yesterday due to not feeling well. Next treatment scheduled for Monday.    2. Acute respiratory failure, placed on Bipap in ICU.  Now weaned to nasal cannula.  Remains on 2 L/min   3. Anemia of chronic kidney disease Recent Labs       Lab Results  Component Value Date    HGB 8.9 (L) 09/18/2024      Hemoglobin borderline.  Will give low-dose EPO with dialysis 4000 units   4.  Incarcerated ventral hernia status post exploratory laparotomy on 9/13.  Wound VAC and NGT placed.  General surgery continues to follow and offer supportive measures and pain management. Surgery will advance diet to clears.    5. Secondary Hyperparathyroidism: with outpatient labs: PTH 993, phosphorus 4.9, calcium 9.5 on 09/07/24.    Recent Labs       Lab Results  Component Value Date    PTH 651 (H) 05/24/2023    CALCIUM 9.4 09/19/2024    CAION 1.11 (L) 08/08/2023    PHOS 8.2 (H) 09/19/2024      Sevelamer  tablets were changed to powder-but we will hold for now until acute GI issues are resolved.     LOS: 6 Brianna [name] 9/20/202512:50 PM

## 2024-09-19 NOTE — Progress Notes (Signed)
 Patient ID: Brianna [name], female   DOB: 09-25-1961, 63 y.o.   MRN: [id number]     SURGICAL PROGRESS NOTE   Hospital Day(s): 6.   Interval History: Patient seen and examined, no acute events or new complaints overnight. Patient reports having back pain.  Patient denies any abdominal pain.  She only complains of back pain.  She endorses she had a good bowel movement this morning.  She endorsed that the abdominal pain resolved after bowel movement.  She denies any nausea or vomiting despite having the NGT clamped since yesterday.  She also denies any nausea and vomiting with clear liquid diet.  Pain in her lower back.  No pain radiation.  There is no specific alleviating or aggravating factors.  Vital signs in last 24 hours: [min-max] current  Temp:  [97.9 F (36.6 C)-98.7 F (37.1 C)] 98 F (36.7 C) (09/20 0840) Pulse Rate:  [77-86] 80 (09/20 0840) Resp:  [16-21] 16 (09/20 0840) BP: (97-133)/(54-88) 109/88 (09/20 0840) SpO2:  [97 %-100 %] 98 % (09/20 0840)     Height: 4' 9 (144.8 cm) Weight: 85.5 kg BMI (Calculated): 40.78   Physical Exam:  Constitutional: alert, cooperative and no distress  Respiratory: breathing non-labored at rest  Cardiovascular: regular rate and sinus rhythm  Gastrointestinal: soft, non-tender, and non-distended.  Midline wound with staples with clean wound.  No erythema.  No drainage.  Labs:     Latest Ref Rng & Units 09/18/2024    6:10 AM 09/17/2024    4:55 AM 09/16/2024    3:27 AM  CBC  WBC 4.0 - 10.5 K/uL 10.4  9.1  7.9   Hemoglobin 12.0 - 15.0 g/dL 8.9  8.9  9.7   Hematocrit 36.0 - 46.0 % 27.6  28.3  30.0   Platelets 150 - 400 K/uL 196  211  198       Latest Ref Rng & Units 09/19/2024    7:30 AM 09/18/2024    6:10 AM 09/17/2024    4:55 AM  CMP  Glucose 70 - 99 mg/dL 79  84  98   BUN 8 - 23 mg/dL 72  97  74   Creatinine 0.44 - 1.00 mg/dL 2.77  1.43  2.95   Sodium 135 - 145 mmol/L 137  140  141   Potassium 3.5 - 5.1 mmol/L 4.5  4.8  4.4   Chloride  98 - 111 mmol/L 95  96  98   CO2 22 - 32 mmol/L 23  22  23    Calcium 8.9 - 10.3 mg/dL 9.4  9.3  9.2     Imaging studies: No new pertinent imaging studies   Assessment/Plan:  63 y.o. female with incarcerated ventral hernia with obstruction 7 Days Post-Op s/p exploratory laparotomy, reduction of incarcerated ventral hernia, complicated by pertinent comorbidities including ESRD on hemodialysis.  -No clinical alteration.  There is stable vital signs.  No fever - Patient had good bowel movement this morning.  Abdominal pain resolved. - There has been no nausea or vomiting despite having NGT clamped.  She also tolerated a clear liquid diet. -Will discontinue NGT and give clear liquid diet for now.  Will assess diet toleration without NG. - Encouraged the patient to ambulate to help with back pain.  I explained that back pain is multifactorial most likely with chronic back issues plus being on these but for a longer period of time is. - Continue pain management - Appreciate hospitalist and nephrologist assisting with medical comorbidities  Lucas Petrin, MD

## 2024-09-19 NOTE — Progress Notes (Signed)
 Progress Note    Brianna [name]  FMW:[id number] DOB: 03-26-1961  DOA: 09/12/2024 PCP: Cecily Katz, PA-C      Brief Narrative:    Medical records reviewed and are as summarized below:  Brianna [name] is a 63 y.o. female with PMH type II DM, ESRD on hemodialysis, OSA on CPAP, chronic pain syndrome, chronic HFpEF, GERD, gout, who was admitted to the hospital for incarcerated abdominal hernia s/p exploratory laparotomy on 09/13/2024.  The hospitalist team was consulted because of tachycardia and respiratory distress. Patient was transferred to stepdown and temporarily placed on BiPAP, later able to wean to nasal cannula at 3 L.  No baseline oxygen use.  Patient also developed postsurgical ileus requiring NG tube placement for decompression.  9/17: Vital stable, blood pressure now improved, she did develop hypotension responded to IV fluid, does not require any pressors.  Started having bowel movement.  Surgery would like to keep NJ tube in for another day due to significant amount of secretions.  Going for dialysis today.  9/18: Hemodynamically stable, borderline soft blood pressure, started on NG tube clamping trial but still having significant secretions.  Small amount of clear liquid was also started.  9/19: Hemodynamically stable, had her dialysis but did not completed the full session, wound VAC was removed by general surgery, surgery is keeping NG tube for another day with clear liquid diet, having bowel movement and passing flatus.  Still having significant discharge with NG tube. PT is recommending SNF  9/20: Hemodynamically stable, NG tube was removed and patient tolerating clear liquid diet.  Assessment/Plan:   Principal Problem:   Incarcerated ventral hernia Active Problems:   ESRD (end stage renal disease) (HCC)   Hernia of abdominal wall   Shock circulatory (HCC)   SBO (small bowel obstruction) (HCC)   Body mass index is 40.79 kg/m.-Class II  obesity)  Incarcerated abdominal hernia:  Postoperative ileus. S/p exploratory laparotomy on 09/13/2024.  Repeat abdominal x-ray on 09/14/2024 showed dilated loops of bowel in the upper abdomen consistent with bowel obstruction. NG tube was placed with immediate output. She has been started on empiric IV antibiotics-completed the course Leukocytosis, improving Lactic acidosis: Improved Follow-up with general surgeon. Having bowel movements and passing flatus, NG tube was removed and patient is tolerating clear liquid diet.  Hypotension: Blood pressure within goal -Started on low-dose midodrine  on 9/18 -Continue with midodrine  - Continue to monitor  Acute respiratory distress: Initially required BiPAP transiently and then wean to nasal cannula, currently saturating 100% on 2 L of oxygen, she uses CPAP at night. - Continue supplemental oxygen-wean as tolerated  Sinus tachycardia: Improved.  CTA chest did not show any evidence of pneumonia or pulmonary embolism.   ESRD: Follow-up with nephrologist for hemodialysis.  Chronic HFpEF: Compensated  Gout: Stable.  Decreased allopurinol  from 300 mg to 100 mg daily (renal dose)  Diet Order             Diet clear liquid Fluid consistency: Thin  Diet effective now                  Consultants: General Surgeon Nephrology  Procedures: Ex lap on 09/13/2024  Medications:    acetaminophen   1,000 mg Oral Q8H   allopurinol   100 mg Oral Daily   Chlorhexidine  Gluconate Cloth  6 each Topical QHS   ezetimibe   10 mg Oral Daily   feeding supplement  1 Container Oral TID BM   heparin   5,000 Units Subcutaneous Q8H  hydrOXYzine   50 mg Oral Daily   insulin  aspart  0-9 Units Subcutaneous Q4H   midodrine   5 mg Oral TID WC   multivitamin  1 tablet Oral QHS   multivitamin with minerals  1 tablet Oral Daily   pantoprazole  (PROTONIX ) IV  40 mg Intravenous Q24H   sertraline   25 mg Oral Daily   thiamine   100 mg Oral Daily   Continuous  Infusions:  Anti-infectives (From admission, onward)    Start     Dose/Rate Route Frequency Ordered Stop   09/16/24 1200  vancomycin  (VANCOCIN ) IVPB 1000 mg/200 mL premix  Status:  Discontinued        1,000 mg 200 mL/hr over 60 Minutes Intravenous Every M-W-F (Hemodialysis) 09/15/24 0309 09/16/24 1359   09/15/24 0400  metroNIDAZOLE  (FLAGYL ) IVPB 500 mg  Status:  Discontinued        500 mg 100 mL/hr over 60 Minutes Intravenous Every 12 hours 09/15/24 0255 09/16/24 1359   09/15/24 0400  vancomycin  (VANCOREADY) IVPB 2000 mg/400 mL        2,000 mg 200 mL/hr over 120 Minutes Intravenous  Once 09/15/24 0300 09/15/24 0737   09/15/24 0345  vancomycin  (VANCOCIN ) IVPB 1000 mg/200 mL premix  Status:  Discontinued        1,000 mg 200 mL/hr over 60 Minutes Intravenous  Once 09/15/24 0255 09/15/24 0300   09/15/24 0330  cefTRIAXone  (ROCEPHIN ) 2 g in sodium chloride  0.9 % 100 mL IVPB  Status:  Discontinued        2 g 200 mL/hr over 30 Minutes Intravenous Daily 09/15/24 0255 09/16/24 1359   09/13/24 0800  ceFAZolin  (ANCEF ) IVPB 1 g/50 mL premix  Status:  Discontinued        1 g 100 mL/hr over 30 Minutes Intravenous Every 8 hours 09/13/24 0241 09/13/24 1301   09/12/24 2015  ceFAZolin  (ANCEF ) IVPB 2g/100 mL premix        2 g 200 mL/hr over 30 Minutes Intravenous  Once 09/12/24 1958 09/13/24 0031       Family Communication/Anticipated D/C date and plan/Code Status   DVT prophylaxis: heparin  injection 5,000 Units Start: 09/13/24 0945 SCDs Start: 09/13/24 0242     Code Status: Full Code  Family Communication: Primary team is communicating with family. Disposition Plan: PT is recommending SNF  Subjective:  Patient was lying comfortably when seen today.  Had a bowel movement and denies any significant pain.  Tolerating clear liquid diet.  Objective:    Vitals:   09/18/24 2133 09/19/24 0306 09/19/24 0840 09/19/24 1551  BP: 133/70 (!) 124/57 109/88 119/71  Pulse: 78 77 80 90  Resp: 19 16 16  16   Temp: 98.5 F (36.9 C) 97.9 F (36.6 C) 98 F (36.7 C) 97.8 F (36.6 C)  TempSrc:   Oral Oral  SpO2: 100% 99% 98% 100%  Weight:      Height:       No data found.   Intake/Output Summary (Last 24 hours) at 09/19/2024 1628 Last data filed at 09/19/2024 1613 Gross per 24 hour  Intake 600 ml  Output 10 ml  Net 590 ml   Filed Weights   09/17/24 0310 09/18/24 0500 09/18/24 0755  Weight: 80.7 kg 81.2 kg 85.5 kg    Exam: General.  Frail lady, in no acute distress. Pulmonary.  Lungs clear bilaterally, normal respiratory effort. CV.  Regular rate and rhythm, no JVD, rub or murmur. Abdomen.  Soft, nontender, nondistended, BS positive. CNS.  Alert and oriented .  No focal neurologic deficit. Extremities.  No edema, no cyanosis, pulses intact and symmetrical.   Data Reviewed:   I have personally reviewed following labs and imaging studies:  Labs: Labs show the following:   Basic Metabolic Panel: Recent Labs  Lab 09/15/24 0311 09/16/24 0327 09/17/24 0455 09/18/24 0610 09/19/24 0730  NA 140 139 141 140 137  K 4.5 4.5 4.4 4.8 4.5  CL 95* 93* 98 96* 95*  CO2 20* 21* 23 22 23   GLUCOSE 175* 107* 98 84 79  BUN 99* 73* 74* 97* 72*  CREATININE 10.15* 7.52* 7.04* 8.56* 7.22*  CALCIUM 9.8 9.2 9.2 9.3 9.4  MG  --  2.1  --   --  2.1  PHOS 8.2* 7.0* 8.2* 8.2* 8.2*   GFR Estimated Creatinine Clearance: 7.3 mL/min (A) (by C-G formula based on SCr of 7.22 mg/dL (H)). Liver Function Tests: Recent Labs  Lab 09/15/24 0311 09/16/24 0327 09/17/24 0455 09/18/24 0610  AST 50*  --   --   --   ALT <5  --   --   --   ALKPHOS 106  --   --   --   BILITOT 0.9  --   --   --   PROT 7.2  --   --   --   ALBUMIN  3.1* 3.0* 3.4* 3.1*   No results for input(s): LIPASE, AMYLASE in the last 168 hours.  No results for input(s): AMMONIA in the last 168 hours. Coagulation profile Recent Labs  Lab 09/15/24 0311  INR 2.1*    CBC: Recent Labs  Lab 09/14/24 0429 09/15/24 0311  09/16/24 0327 09/17/24 0455 09/18/24 0610  WBC 12.9* 11.5* 7.9 9.1 10.4  NEUTROABS 10.2* 9.0*  --   --   --   HGB 11.9* 10.1* 9.7* 8.9* 8.9*  HCT 37.7 30.9* 30.0* 28.3* 27.6*  MCV 93.8 92.2 92.6 94.6 93.9  PLT 252 243 198 211 196   Cardiac Enzymes: No results for input(s): CKTOTAL, CKMB, CKMBINDEX, TROPONINI in the last 168 hours. BNP (last 3 results) No results for input(s): PROBNP in the last 8760 hours. CBG: Recent Labs  Lab 09/19/24 0034 09/19/24 0449 09/19/24 0814 09/19/24 1132 09/19/24 1547  GLUCAP 90 102* 85 111* 72   D-Dimer: No results for input(s): DDIMER in the last 72 hours. Hgb A1c: No results for input(s): HGBA1C in the last 72 hours.  Lipid Profile: No results for input(s): CHOL, HDL, LDLCALC, TRIG, CHOLHDL, LDLDIRECT in the last 72 hours. Thyroid function studies: No results for input(s): TSH, T4TOTAL, T3FREE, THYROIDAB in the last 72 hours.  Invalid input(s): FREET3  Anemia work up: No results for input(s): VITAMINB12, FOLATE, FERRITIN, TIBC, IRON, RETICCTPCT in the last 72 hours. Sepsis Labs: Recent Labs  Lab 09/15/24 0311 09/15/24 0619 09/15/24 1615 09/15/24 1859 09/16/24 0327 09/17/24 0455 09/18/24 0610  WBC 11.5*  --   --   --  7.9 9.1 10.4  LATICACIDVEN 3.6* 1.4 1.0 1.0  --   --   --     Microbiology Recent Results (from the past 240 hours)  Culture, blood (x 2)     Status: None (Preliminary result)   Collection Time: 09/15/24  3:11 AM   Specimen: BLOOD RIGHT HAND  Result Value Ref Range Status   Specimen Description BLOOD RIGHT HAND  Final   Special Requests   Final    BOTTLES DRAWN AEROBIC ONLY Blood Culture adequate volume   Culture   Final    NO GROWTH 4  DAYS Performed at [facility], [address] [place], [place] [address]    Report Status PENDING  Incomplete  Culture, blood (x 2)     Status: None (Preliminary result)   Collection Time: 09/15/24  3:11 AM    Specimen: BLOOD RIGHT ARM  Result Value Ref Range Status   Specimen Description BLOOD RIGHT ARM  Final   Special Requests   Final    BOTTLES DRAWN AEROBIC ONLY Blood Culture adequate volume   Culture   Final    NO GROWTH 4 DAYS Performed at [facility], [address] [place], [place] [address]    Report Status PENDING  Incomplete  MRSA Next Gen by PCR, Nasal     Status: None   Collection Time: 09/15/24  4:16 AM   Specimen: Nasal Mucosa; Nasal Swab  Result Value Ref Range Status   MRSA by PCR Next Gen NOT DETECTED NOT DETECTED Final    Comment: (NOTE) The GeneXpert MRSA Assay (FDA approved for NASAL specimens only), is one component of a comprehensive MRSA colonization surveillance program. It is not intended to diagnose MRSA infection nor to guide or monitor treatment for MRSA infections. Test performance is not FDA approved in patients less than 80 years old. Performed at [facility], [address] [place], [place] [address]     Procedures and diagnostic studies:  No results found.    LOS: 6 days   Amaryllis Dare, MD  [facility]   Pager on [email or web address]. If 7PM-7AM, please contact night-coverage at [email or web address]   09/19/2024, 4:28 PM

## 2024-09-19 NOTE — Plan of Care (Signed)
  Problem: Education: Goal: Knowledge of General Education information will improve Description: Including pain rating scale, medication(s)/side effects and non-pharmacologic comfort measures Outcome: Progressing   Problem: Health Behavior/Discharge Planning: Goal: Ability to manage health-related needs will improve Outcome: Progressing   Problem: Clinical Measurements: Goal: Ability to maintain clinical measurements within normal limits will improve Outcome: Progressing Goal: Will remain free from infection Outcome: Progressing Goal: Diagnostic test results will improve Outcome: Progressing Goal: Respiratory complications will improve Outcome: Progressing Goal: Cardiovascular complication will be avoided Outcome: Progressing   Problem: Activity: Goal: Risk for activity intolerance will decrease Outcome: Progressing   Problem: Nutrition: Goal: Adequate nutrition will be maintained Outcome: Progressing   Problem: Coping: Goal: Level of anxiety will decrease Outcome: Progressing   Problem: Elimination: Goal: Will not experience complications related to bowel motility Outcome: Progressing Goal: Will not experience complications related to urinary retention Outcome: Progressing   Problem: Pain Managment: Goal: General experience of comfort will improve and/or be controlled Outcome: Progressing   Problem: Safety: Goal: Ability to remain free from injury will improve Outcome: Progressing   Problem: Skin Integrity: Goal: Risk for impaired skin integrity will decrease Outcome: Progressing   Problem: Education: Goal: Ability to describe self-care measures that may prevent or decrease complications (Diabetes Survival Skills Education) will improve Outcome: Progressing   Problem: Coping: Goal: Ability to adjust to condition or change in health will improve Outcome: Progressing   Problem: Fluid Volume: Goal: Ability to maintain a balanced intake and output will  improve Outcome: Progressing   Problem: Health Behavior/Discharge Planning: Goal: Ability to identify and utilize available resources and services will improve Outcome: Progressing Goal: Ability to manage health-related needs will improve Outcome: Progressing   Problem: Metabolic: Goal: Ability to maintain appropriate glucose levels will improve Outcome: Progressing   Problem: Nutritional: Goal: Maintenance of adequate nutrition will improve Outcome: Progressing Goal: Progress toward achieving an optimal weight will improve Outcome: Progressing   Problem: Skin Integrity: Goal: Risk for impaired skin integrity will decrease Outcome: Progressing   Problem: Tissue Perfusion: Goal: Adequacy of tissue perfusion will improve Outcome: Progressing   Problem: Fluid Volume: Goal: Hemodynamic stability will improve Outcome: Progressing   Problem: Clinical Measurements: Goal: Diagnostic test results will improve Outcome: Progressing Goal: Signs and symptoms of infection will decrease Outcome: Progressing   Problem: Respiratory: Goal: Ability to maintain adequate ventilation will improve Outcome: Progressing

## 2024-09-20 DIAGNOSIS — N186 End stage renal disease: Secondary | ICD-10-CM | POA: Diagnosis not present

## 2024-09-20 DIAGNOSIS — K439 Ventral hernia without obstruction or gangrene: Secondary | ICD-10-CM | POA: Diagnosis not present

## 2024-09-20 DIAGNOSIS — K56609 Unspecified intestinal obstruction, unspecified as to partial versus complete obstruction: Secondary | ICD-10-CM | POA: Diagnosis not present

## 2024-09-20 DIAGNOSIS — K436 Other and unspecified ventral hernia with obstruction, without gangrene: Secondary | ICD-10-CM | POA: Diagnosis not present

## 2024-09-20 LAB — BASIC METABOLIC PANEL WITH GFR
Anion gap: 24 — ABNORMAL HIGH (ref 5–15)
BUN: 91 mg/dL — ABNORMAL HIGH (ref 8–23)
CO2: 21 mmol/L — ABNORMAL LOW (ref 22–32)
Calcium: 9.6 mg/dL (ref 8.9–10.3)
Chloride: 92 mmol/L — ABNORMAL LOW (ref 98–111)
Creatinine, Ser: 8.56 mg/dL — ABNORMAL HIGH (ref 0.44–1.00)
GFR, Estimated: 5 mL/min — ABNORMAL LOW (ref >60)
Glucose, Bld: 103 mg/dL — ABNORMAL HIGH (ref 70–99)
Potassium: 4.6 mmol/L (ref 3.5–5.1)
Sodium: 137 mmol/L (ref 135–145)

## 2024-09-20 LAB — CULTURE, BLOOD (ROUTINE X 2)
Culture: NO GROWTH
Culture: NO GROWTH
Special Requests: ADEQUATE
Special Requests: ADEQUATE

## 2024-09-20 LAB — GLUCOSE, CAPILLARY
Glucose-Capillary: 103 mg/dL — ABNORMAL HIGH (ref 70–99)
Glucose-Capillary: 113 mg/dL — ABNORMAL HIGH (ref 70–99)
Glucose-Capillary: 114 mg/dL — ABNORMAL HIGH (ref 70–99)
Glucose-Capillary: 131 mg/dL — ABNORMAL HIGH (ref 70–99)
Glucose-Capillary: 85 mg/dL (ref 70–99)
Glucose-Capillary: 97 mg/dL (ref 70–99)

## 2024-09-20 LAB — PHOSPHORUS: Phosphorus: 8.8 mg/dL — ABNORMAL HIGH (ref 2.5–4.6)

## 2024-09-20 LAB — MAGNESIUM: Magnesium: 2 mg/dL (ref 1.7–2.4)

## 2024-09-20 MED ORDER — CHLORHEXIDINE GLUCONATE CLOTH 2 % EX PADS
6.0000 | MEDICATED_PAD | Freq: Every day | CUTANEOUS | Status: DC
  Administered 2024-09-21 – 2024-09-24 (×4): 6 via TOPICAL

## 2024-09-20 NOTE — Progress Notes (Signed)
 [facility]  ROUNDING NOTE   Subjective:  Patient seen and evaluated during rounding.  Patient appears drowsy, was just given medications for pain. Placed on cpap as patient is falling asleep.   Objective:  Vital signs in last 24 hours:  Temp:  [97.8 F (36.6 C)-98.1 F (36.7 C)] 98.1 F (36.7 C) (09/21 0317) Pulse Rate:  [82-90] 82 (09/21 0317) Resp:  [16-19] 16 (09/21 0317) BP: (119-150)/(65-81) 123/65 (09/21 0317) SpO2:  [94 %-100 %] 94 % (09/21 0317)  Weight change:  Filed Weights   09/17/24 0310 09/18/24 0500 09/18/24 0755  Weight: 80.7 kg 81.2 kg 85.5 kg    Intake/Output: I/O last 3 completed shifts: In: 840 [P.O.:840] Out: 10 [Emesis/NG output:10]   Intake/Output this shift:  No intake/output data recorded.  Physical Exam: General: NAD  Head: Normocephalic  Eyes: Anicteric, PERRL  Neck: Supple  Lungs:  Clear, 2L [place]  Heart: Regular rate  Abdomen:  Soft  Extremities:  No peripheral edema.  Neurologic: Drowsy but responsive to voice  Skin: No rashes  Access: Rt chest permcath, Left AVF maturing    Basic Metabolic Panel: Recent Labs  Lab 09/16/24 0327 09/17/24 0455 09/18/24 0610 09/19/24 0730 09/20/24 0540  NA 139 141 140 137 137  K 4.5 4.4 4.8 4.5 4.6  CL 93* 98 96* 95* 92*  CO2 21* 23 22 23  21*  GLUCOSE 107* 98 84 79 103*  BUN 73* 74* 97* 72* 91*  CREATININE 7.52* 7.04* 8.56* 7.22* 8.56*  CALCIUM 9.2 9.2 9.3 9.4 9.6  MG 2.1  --   --  2.1 2.0  PHOS 7.0* 8.2* 8.2* 8.2* 8.8*    Liver Function Tests: Recent Labs  Lab 09/15/24 0311 09/16/24 0327 09/17/24 0455 09/18/24 0610  AST 50*  --   --   --   ALT <5  --   --   --   ALKPHOS 106  --   --   --   BILITOT 0.9  --   --   --   PROT 7.2  --   --   --   ALBUMIN  3.1* 3.0* 3.4* 3.1*   No results for input(s): LIPASE, AMYLASE in the last 168 hours. No results for input(s): AMMONIA in the last 168 hours.  CBC: Recent Labs  Lab 09/14/24 0429 09/15/24 0311 09/16/24 0327  09/17/24 0455 09/18/24 0610  WBC 12.9* 11.5* 7.9 9.1 10.4  NEUTROABS 10.2* 9.0*  --   --   --   HGB 11.9* 10.1* 9.7* 8.9* 8.9*  HCT 37.7 30.9* 30.0* 28.3* 27.6*  MCV 93.8 92.2 92.6 94.6 93.9  PLT 252 243 198 211 196    Cardiac Enzymes: No results for input(s): CKTOTAL, CKMB, CKMBINDEX, TROPONINI in the last 168 hours.  BNP: Invalid input(s): POCBNP  CBG: Recent Labs  Lab 09/19/24 1547 09/19/24 1927 09/19/24 2326 09/20/24 0317 09/20/24 0742  GLUCAP 72 114* 121* 114* 131*    Microbiology: Results for orders placed or performed during the hospital encounter of 09/12/24  Culture, blood (x 2)     Status: None   Collection Time: 09/15/24  3:11 AM   Specimen: BLOOD RIGHT HAND  Result Value Ref Range Status   Specimen Description BLOOD RIGHT HAND  Final   Special Requests   Final    BOTTLES DRAWN AEROBIC ONLY Blood Culture adequate volume   Culture   Final    NO GROWTH 5 DAYS Performed at [facility], [address], [place], [place] [address]  Report Status 09/20/2024 FINAL  Final  Culture, blood (x 2)     Status: None   Collection Time: 09/15/24  3:11 AM   Specimen: BLOOD RIGHT ARM  Result Value Ref Range Status   Specimen Description BLOOD RIGHT ARM  Final   Special Requests   Final    BOTTLES DRAWN AEROBIC ONLY Blood Culture adequate volume   Culture   Final    NO GROWTH 5 DAYS Performed at [facility], [address] [place], [place] [address]    Report Status 09/20/2024 FINAL  Final  MRSA Next Gen by PCR, Nasal     Status: None   Collection Time: 09/15/24  4:16 AM   Specimen: Nasal Mucosa; Nasal Swab  Result Value Ref Range Status   MRSA by PCR Next Gen NOT DETECTED NOT DETECTED Final    Comment: (NOTE) The GeneXpert MRSA Assay (FDA approved for NASAL specimens only), is one component of a comprehensive MRSA colonization surveillance program. It is not intended to diagnose MRSA infection nor to guide or monitor treatment  for MRSA infections. Test performance is not FDA approved in patients less than 42 years old. Performed at [facility], [address] [place], [place] [address]     Coagulation Studies: No results for input(s): LABPROT, INR in the last 72 hours.  Urinalysis: No results for input(s): COLORURINE, LABSPEC, PHURINE, GLUCOSEU, HGBUR, BILIRUBINUR, KETONESUR, PROTEINUR, UROBILINOGEN, NITRITE, LEUKOCYTESUR in the last 72 hours.  Invalid input(s): APPERANCEUR    Imaging: No results found.   Medications:     acetaminophen   1,000 mg Oral Q8H   allopurinol   100 mg Oral Daily   Chlorhexidine  Gluconate Cloth  6 each Topical QHS   ezetimibe   10 mg Oral Daily   feeding supplement  1 Container Oral TID BM   heparin   5,000 Units Subcutaneous Q8H   hydrOXYzine   50 mg Oral Daily   insulin  aspart  0-9 Units Subcutaneous Q4H   midodrine   5 mg Oral TID WC   multivitamin  1 tablet Oral QHS   multivitamin with minerals  1 tablet Oral Daily   pantoprazole  (PROTONIX ) IV  40 mg Intravenous Q24H   sertraline   25 mg Oral Daily   thiamine   100 mg Oral Daily   albuterol , ALPRAZolam , HYDROmorphone  (DILAUDID ) injection, methocarbamol  (ROBAXIN ) injection, ondansetron  (ZOFRAN ) IV, ondansetron , oxyCODONE , oxyCODONE , zolpidem   Assessment/ Plan:  [name] is a 63 y.o.  female  with past medical conditions including obstructive sleep apnea on CPAP, gout, GERD, type 2 diabetes, and end-stage renal disease on hemodialysis.  Patient presented to the hospital due to incarcerated and obstructed ventral hernia, experienced exploratory laparotomy on 9/13.  Patient is currently admitted for SBO (small bowel obstruction) (HCC) [K56.609] Hernia of abdominal wall [K43.9] Incarcerated ventral hernia [K43.6]   [facility]/MWF/right IJ PermCath/left upper AVF.    End-stage renal disease on hemodialysis.  Continue MWF schedule.Signed AMA Saturday due to not  feeling well. Next treatment scheduled for tomorrow.   2. Acute respiratory failure, placed on Bipap in ICU.  Now weaned to nasal cannula.  Remains on 2 L/min   3. Anemia of chronic kidney disease Recent Labs           Lab Results  Component Value Date    HGB 8.9 (L) 09/18/2024      Hemoglobin borderline.  Will continue to give low-dose EPO with dialysis 4000 units   4.  Incarcerated ventral hernia status post exploratory laparotomy on  9/13.  Wound VAC and NGT placed.  General surgery continues to follow and offer supportive measures and pain management. Surgery will advance diet as tolerated.   5. Secondary Hyperparathyroidism: with outpatient labs: PTH 993, phosphorus 4.9, calcium 9.5 on 09/07/24.    Recent Labs           Lab Results  Component Value Date    PTH 651 (H) 05/24/2023    CALCIUM 9.6 09/20/2024    CAION 1.11 (L) 08/08/2023    PHOS 8.8 (H) 09/20/2024      Sevelamer  tablets were changed to powder-but we will hold for now until acute GI issues are resolved.      LOS: 7 Tenesia Escudero P Levorn 9/21/202510:40 AM

## 2024-09-20 NOTE — Plan of Care (Signed)
  Problem: Education: Goal: Knowledge of General Education information will improve Description: Including pain rating scale, medication(s)/side effects and non-pharmacologic comfort measures Outcome: Progressing   Problem: Health Behavior/Discharge Planning: Goal: Ability to manage health-related needs will improve Outcome: Progressing   Problem: Clinical Measurements: Goal: Ability to maintain clinical measurements within normal limits will improve Outcome: Progressing Goal: Will remain free from infection Outcome: Progressing Goal: Diagnostic test results will improve Outcome: Progressing Goal: Respiratory complications will improve Outcome: Progressing Goal: Cardiovascular complication will be avoided Outcome: Progressing   Problem: Activity: Goal: Risk for activity intolerance will decrease Outcome: Progressing   Problem: Nutrition: Goal: Adequate nutrition will be maintained Outcome: Progressing   Problem: Coping: Goal: Level of anxiety will decrease Outcome: Progressing   Problem: Elimination: Goal: Will not experience complications related to bowel motility Outcome: Progressing Goal: Will not experience complications related to urinary retention Outcome: Progressing   Problem: Pain Managment: Goal: General experience of comfort will improve and/or be controlled Outcome: Progressing   Problem: Safety: Goal: Ability to remain free from injury will improve Outcome: Progressing   Problem: Fluid Volume: Goal: Ability to maintain a balanced intake and output will improve Outcome: Progressing   Problem: Coping: Goal: Ability to adjust to condition or change in health will improve Outcome: Progressing

## 2024-09-20 NOTE — Progress Notes (Signed)
 Patient ID: Brianna [name], female   DOB: [birth year], 63 y.o.   MRN: [id number]     SURGICAL PROGRESS NOTE   Hospital Day(s): 7.   Interval History: Patient seen and examined, no acute events or new complaints overnight. Patient reports feeling better.  She denies any significant abdominal pain.  She endorses that back pain is a little bit better as well.  She endorses continued passing gas and having bowel movement.  She denies any nausea.  Vital signs in last 24 hours: [min-max] current  Temp:  [97.8 F (36.6 C)-98.1 F (36.7 C)] 98.1 F (36.7 C) (09/21 0317) Pulse Rate:  [82-90] 82 (09/21 0317) Resp:  [16-19] 16 (09/21 0317) BP: (119-150)/(65-81) 123/65 (09/21 0317) SpO2:  [94 %-100 %] 94 % (09/21 0317)     Height: 4' 9 (144.8 cm) Weight: 85.5 kg BMI (Calculated): 40.78   Physical Exam:  Constitutional: alert, cooperative and no distress  Respiratory: breathing non-labored at rest  Cardiovascular: regular rate and sinus rhythm  Gastrointestinal: soft, non-tender, and non-distended.  Incision is dry and clean  Labs:     Latest Ref Rng & Units 09/18/2024    6:10 AM 09/17/2024    4:55 AM 09/16/2024    3:27 AM  CBC  WBC 4.0 - 10.5 K/uL 10.4  9.1  7.9   Hemoglobin 12.0 - 15.0 g/dL 8.9  8.9  9.7   Hematocrit 36.0 - 46.0 % 27.6  28.3  30.0   Platelets 150 - 400 K/uL 196  211  198       Latest Ref Rng & Units 09/20/2024    5:40 AM 09/19/2024    7:30 AM 09/18/2024    6:10 AM  CMP  Glucose 70 - 99 mg/dL 896  79  84   BUN 8 - 23 mg/dL 91  72  97   Creatinine 0.44 - 1.00 mg/dL 1.43  2.77  1.43   Sodium 135 - 145 mmol/L 137  137  140   Potassium 3.5 - 5.1 mmol/L 4.6  4.5  4.8   Chloride 98 - 111 mmol/L 92  95  96   CO2 22 - 32 mmol/L 21  23  22    Calcium 8.9 - 10.3 mg/dL 9.6  9.4  9.3     Imaging studies: No new pertinent imaging studies   Assessment/Plan:  63 y.o. female with incarcerated ventral hernia with obstruction 7 Days Post-Op s/p exploratory laparotomy, reduction  of incarcerated ventral hernia, complicated by pertinent comorbidities including ESRD on hemodialysis.   -No clinical alteration.  There is stable vital signs.  No fever. -Continues to have slow, adequate recovery - Continue having bowel movement this morning.   - Will advance diet to full liquids -Encourage the patient to ambulate - Continue pain management - Appreciate hospitalist and nephrologist assisting with medical comorbidities  Lucas Petrin, MD

## 2024-09-20 NOTE — Progress Notes (Signed)
 Progress Note    Brianna [name]  FMW:[id number] DOB: 05/01/1961  DOA: 09/12/2024 PCP: Cecily Katz, PA-C      Brief Narrative:    Medical records reviewed and are as summarized below:  Brianna [name] is a 63 y.o. female with PMH type II DM, ESRD on hemodialysis, OSA on CPAP, chronic pain syndrome, chronic HFpEF, GERD, gout, who was admitted to the hospital for incarcerated abdominal hernia s/p exploratory laparotomy on 09/13/2024.  The hospitalist team was consulted because of tachycardia and respiratory distress. Patient was transferred to stepdown and temporarily placed on BiPAP, later able to wean to nasal cannula at 3 L.  No baseline oxygen use.  Patient also developed postsurgical ileus requiring NG tube placement for decompression.  9/17: Vital stable, blood pressure now improved, she did develop hypotension responded to IV fluid, does not require any pressors.  Started having bowel movement.  Surgery would like to keep NJ tube in for another day due to significant amount of secretions.  Going for dialysis today.  9/18: Hemodynamically stable, borderline soft blood pressure, started on NG tube clamping trial but still having significant secretions.  Small amount of clear liquid was also started.  9/19: Hemodynamically stable, had her dialysis but did not completed the full session, wound VAC was removed by general surgery, surgery is keeping NG tube for another day with clear liquid diet, having bowel movement and passing flatus.  Still having significant discharge with NG tube. PT is recommending SNF  9/20: Hemodynamically stable, NG tube was removed and patient tolerating clear liquid diet.  9/21: Remained hemodynamically stable and tolerating full liquid diet, having bowel movement.  Assessment/Plan:   Principal Problem:   Incarcerated ventral hernia Active Problems:   ESRD (end stage renal disease) (HCC)   Hernia of abdominal wall   Shock circulatory  (HCC)   SBO (small bowel obstruction) (HCC)   Body mass index is 40.79 kg/m.-Class II obesity)  Incarcerated abdominal hernia:  Postoperative ileus. S/p exploratory laparotomy on 09/13/2024.  Repeat abdominal x-ray on 09/14/2024 showed dilated loops of bowel in the upper abdomen consistent with bowel obstruction. NG tube was placed with immediate output. She has been started on empiric IV antibiotics-completed the course Leukocytosis, improving Lactic acidosis: Improved Follow-up with general surgeon. Having bowel movements and passing flatus, NG tube was removed and patient is tolerating clear liquid diet.  Hypotension: Blood pressure within goal -Started on low-dose midodrine  on 9/18 -Continue with midodrine  - Continue to monitor  Acute respiratory distress: Initially required BiPAP transiently and then wean to nasal cannula, currently saturating 100% on 2 L of oxygen, she uses CPAP at night. - Continue supplemental oxygen-wean as tolerated  Sinus tachycardia: Improved.  CTA chest did not show any evidence of pneumonia or pulmonary embolism.   ESRD: Follow-up with nephrologist for hemodialysis.  Chronic HFpEF: Compensated  Gout: Stable.  Decreased allopurinol  from 300 mg to 100 mg daily (renal dose)  Diet Order             Diet full liquid Fluid consistency: Thin  Diet effective now                  Consultants: General Surgeon Nephrology  Procedures: Ex lap on 09/13/2024  Medications:    acetaminophen   1,000 mg Oral Q8H   allopurinol   100 mg Oral Daily   [START ON 09/21/2024] Chlorhexidine  Gluconate Cloth  6 each Topical Q0600   ezetimibe   10 mg Oral Daily  feeding supplement  1 Container Oral TID BM   heparin   5,000 Units Subcutaneous Q8H   hydrOXYzine   50 mg Oral Daily   insulin  aspart  0-9 Units Subcutaneous Q4H   midodrine   5 mg Oral TID WC   multivitamin  1 tablet Oral QHS   multivitamin with minerals  1 tablet Oral Daily   pantoprazole  (PROTONIX )  IV  40 mg Intravenous Q24H   sertraline   25 mg Oral Daily   thiamine   100 mg Oral Daily   Continuous Infusions:  Anti-infectives (From admission, onward)    Start     Dose/Rate Route Frequency Ordered Stop   09/16/24 1200  vancomycin  (VANCOCIN ) IVPB 1000 mg/200 mL premix  Status:  Discontinued        1,000 mg 200 mL/hr over 60 Minutes Intravenous Every M-W-F (Hemodialysis) 09/15/24 0309 09/16/24 1359   09/15/24 0400  metroNIDAZOLE  (FLAGYL ) IVPB 500 mg  Status:  Discontinued        500 mg 100 mL/hr over 60 Minutes Intravenous Every 12 hours 09/15/24 0255 09/16/24 1359   09/15/24 0400  vancomycin  (VANCOREADY) IVPB 2000 mg/400 mL        2,000 mg 200 mL/hr over 120 Minutes Intravenous  Once 09/15/24 0300 09/15/24 0737   09/15/24 0345  vancomycin  (VANCOCIN ) IVPB 1000 mg/200 mL premix  Status:  Discontinued        1,000 mg 200 mL/hr over 60 Minutes Intravenous  Once 09/15/24 0255 09/15/24 0300   09/15/24 0330  cefTRIAXone  (ROCEPHIN ) 2 g in sodium chloride  0.9 % 100 mL IVPB  Status:  Discontinued        2 g 200 mL/hr over 30 Minutes Intravenous Daily 09/15/24 0255 09/16/24 1359   09/13/24 0800  ceFAZolin  (ANCEF ) IVPB 1 g/50 mL premix  Status:  Discontinued        1 g 100 mL/hr over 30 Minutes Intravenous Every 8 hours 09/13/24 0241 09/13/24 1301   09/12/24 2015  ceFAZolin  (ANCEF ) IVPB 2g/100 mL premix        2 g 200 mL/hr over 30 Minutes Intravenous  Once 09/12/24 1958 09/13/24 0031       Family Communication/Anticipated D/C date and plan/Code Status   DVT prophylaxis: heparin  injection 5,000 Units Start: 09/13/24 0945 SCDs Start: 09/13/24 0242     Code Status: Full Code  Family Communication: Primary team is communicating with family. Disposition Plan: PT is recommending SNF  Subjective:  Patient was seen and examined today.,  Tolerating full liquid diet.  No new concern had a bowel movement.  Objective:    Vitals:   09/19/24 0840 09/19/24 1551 09/19/24 1955 09/20/24  0317  BP: 109/88 119/71 (!) 150/81 123/65  Pulse: 80 90 87 82  Resp: 16 16 19 16   Temp: 98 F (36.7 C) 97.8 F (36.6 C) 98.1 F (36.7 C) 98.1 F (36.7 C)  TempSrc: Oral Oral    SpO2: 98% 100% 100% 94%  Weight:      Height:       No data found.   Intake/Output Summary (Last 24 hours) at 09/20/2024 1347 Last data filed at 09/20/2024 0900 Gross per 24 hour  Intake 600 ml  Output --  Net 600 ml   Filed Weights   09/17/24 0310 09/18/24 0500 09/18/24 0755  Weight: 80.7 kg 81.2 kg 85.5 kg    Exam: General.  Frail lady, in no acute distress. Pulmonary.  Lungs clear bilaterally, normal respiratory effort. CV.  Regular rate and rhythm, no JVD, rub or  murmur. Abdomen.  Soft, nontender, nondistended, BS positive. CNS.  Alert and oriented .  No focal neurologic deficit. Extremities.  No edema, no cyanosis, pulses intact and symmetrical.    Data Reviewed:   I have personally reviewed following labs and imaging studies:  Labs: Labs show the following:   Basic Metabolic Panel: Recent Labs  Lab 09/16/24 0327 09/17/24 0455 09/18/24 0610 09/19/24 0730 09/20/24 0540  NA 139 141 140 137 137  K 4.5 4.4 4.8 4.5 4.6  CL 93* 98 96* 95* 92*  CO2 21* 23 22 23  21*  GLUCOSE 107* 98 84 79 103*  BUN 73* 74* 97* 72* 91*  CREATININE 7.52* 7.04* 8.56* 7.22* 8.56*  CALCIUM 9.2 9.2 9.3 9.4 9.6  MG 2.1  --   --  2.1 2.0  PHOS 7.0* 8.2* 8.2* 8.2* 8.8*   GFR Estimated Creatinine Clearance: 6.2 mL/min (A) (by C-G formula based on SCr of 8.56 mg/dL (H)). Liver Function Tests: Recent Labs  Lab 09/15/24 0311 09/16/24 0327 09/17/24 0455 09/18/24 0610  AST 50*  --   --   --   ALT <5  --   --   --   ALKPHOS 106  --   --   --   BILITOT 0.9  --   --   --   PROT 7.2  --   --   --   ALBUMIN  3.1* 3.0* 3.4* 3.1*   No results for input(s): LIPASE, AMYLASE in the last 168 hours.  No results for input(s): AMMONIA in the last 168 hours. Coagulation profile Recent Labs  Lab  09/15/24 0311  INR 2.1*    CBC: Recent Labs  Lab 09/14/24 0429 09/15/24 0311 09/16/24 0327 09/17/24 0455 09/18/24 0610  WBC 12.9* 11.5* 7.9 9.1 10.4  NEUTROABS 10.2* 9.0*  --   --   --   HGB 11.9* 10.1* 9.7* 8.9* 8.9*  HCT 37.7 30.9* 30.0* 28.3* 27.6*  MCV 93.8 92.2 92.6 94.6 93.9  PLT 252 243 198 211 196   Cardiac Enzymes: No results for input(s): CKTOTAL, CKMB, CKMBINDEX, TROPONINI in the last 168 hours. BNP (last 3 results) No results for input(s): PROBNP in the last 8760 hours. CBG: Recent Labs  Lab 09/19/24 1927 09/19/24 2326 09/20/24 0317 09/20/24 0742 09/20/24 1153  GLUCAP 114* 121* 114* 131* 103*   D-Dimer: No results for input(s): DDIMER in the last 72 hours. Hgb A1c: No results for input(s): HGBA1C in the last 72 hours.  Lipid Profile: No results for input(s): CHOL, HDL, LDLCALC, TRIG, CHOLHDL, LDLDIRECT in the last 72 hours. Thyroid function studies: No results for input(s): TSH, T4TOTAL, T3FREE, THYROIDAB in the last 72 hours.  Invalid input(s): FREET3  Anemia work up: No results for input(s): VITAMINB12, FOLATE, FERRITIN, TIBC, IRON, RETICCTPCT in the last 72 hours. Sepsis Labs: Recent Labs  Lab 09/15/24 0311 09/15/24 0619 09/15/24 1615 09/15/24 1859 09/16/24 0327 09/17/24 0455 09/18/24 0610  WBC 11.5*  --   --   --  7.9 9.1 10.4  LATICACIDVEN 3.6* 1.4 1.0 1.0  --   --   --     Microbiology Recent Results (from the past 240 hours)  Culture, blood (x 2)     Status: None   Collection Time: 09/15/24  3:11 AM   Specimen: BLOOD RIGHT HAND  Result Value Ref Range Status   Specimen Description BLOOD RIGHT HAND  Final   Special Requests   Final    BOTTLES DRAWN AEROBIC ONLY Blood Culture adequate volume  Culture   Final    NO GROWTH 5 DAYS Performed at [facility], [address] [place], [place] [address]    Report Status 09/20/2024 FINAL  Final  Culture, blood (x 2)      Status: None   Collection Time: 09/15/24  3:11 AM   Specimen: BLOOD RIGHT ARM  Result Value Ref Range Status   Specimen Description BLOOD RIGHT ARM  Final   Special Requests   Final    BOTTLES DRAWN AEROBIC ONLY Blood Culture adequate volume   Culture   Final    NO GROWTH 5 DAYS Performed at [facility], [address] [place], [place] [address]    Report Status 09/20/2024 FINAL  Final  MRSA Next Gen by PCR, Nasal     Status: None   Collection Time: 09/15/24  4:16 AM   Specimen: Nasal Mucosa; Nasal Swab  Result Value Ref Range Status   MRSA by PCR Next Gen NOT DETECTED NOT DETECTED Final    Comment: (NOTE) The GeneXpert MRSA Assay (FDA approved for NASAL specimens only), is one component of a comprehensive MRSA colonization surveillance program. It is not intended to diagnose MRSA infection nor to guide or monitor treatment for MRSA infections. Test performance is not FDA approved in patients less than 60 years old. Performed at [facility], [address] [place], [place] [address]     Procedures and diagnostic studies:  No results found.    LOS: 7 days   Amaryllis Dare, MD  [facility]   Pager on [email or web address]. If 7PM-7AM, please contact night-coverage at [email or web address]   09/20/2024, 1:47 PM

## 2024-09-20 NOTE — Plan of Care (Signed)
  Problem: Education: Goal: Knowledge of General Education information will improve Description: Including pain rating scale, medication(s)/side effects and non-pharmacologic comfort measures Outcome: Progressing   Problem: Health Behavior/Discharge Planning: Goal: Ability to manage health-related needs will improve Outcome: Progressing   Problem: Clinical Measurements: Goal: Ability to maintain clinical measurements within normal limits will improve Outcome: Progressing Goal: Will remain free from infection Outcome: Progressing Goal: Diagnostic test results will improve Outcome: Progressing Goal: Respiratory complications will improve Outcome: Progressing Goal: Cardiovascular complication will be avoided Outcome: Progressing   Problem: Activity: Goal: Risk for activity intolerance will decrease Outcome: Progressing   Problem: Nutrition: Goal: Adequate nutrition will be maintained Outcome: Progressing   Problem: Coping: Goal: Level of anxiety will decrease Outcome: Progressing   Problem: Elimination: Goal: Will not experience complications related to bowel motility Outcome: Progressing Goal: Will not experience complications related to urinary retention Outcome: Progressing   Problem: Pain Managment: Goal: General experience of comfort will improve and/or be controlled Outcome: Progressing   Problem: Safety: Goal: Ability to remain free from injury will improve Outcome: Progressing   Problem: Skin Integrity: Goal: Risk for impaired skin integrity will decrease Outcome: Progressing   Problem: Education: Goal: Ability to describe self-care measures that may prevent or decrease complications (Diabetes Survival Skills Education) will improve Outcome: Progressing   Problem: Coping: Goal: Ability to adjust to condition or change in health will improve Outcome: Progressing   Problem: Fluid Volume: Goal: Ability to maintain a balanced intake and output will  improve Outcome: Progressing   Problem: Health Behavior/Discharge Planning: Goal: Ability to identify and utilize available resources and services will improve Outcome: Progressing Goal: Ability to manage health-related needs will improve Outcome: Progressing   Problem: Metabolic: Goal: Ability to maintain appropriate glucose levels will improve Outcome: Progressing   Problem: Nutritional: Goal: Maintenance of adequate nutrition will improve Outcome: Progressing Goal: Progress toward achieving an optimal weight will improve Outcome: Progressing   Problem: Skin Integrity: Goal: Risk for impaired skin integrity will decrease Outcome: Progressing   Problem: Tissue Perfusion: Goal: Adequacy of tissue perfusion will improve Outcome: Progressing   Problem: Fluid Volume: Goal: Hemodynamic stability will improve Outcome: Progressing   Problem: Clinical Measurements: Goal: Diagnostic test results will improve Outcome: Progressing Goal: Signs and symptoms of infection will decrease Outcome: Progressing   Problem: Respiratory: Goal: Ability to maintain adequate ventilation will improve Outcome: Progressing

## 2024-09-21 DIAGNOSIS — K439 Ventral hernia without obstruction or gangrene: Secondary | ICD-10-CM | POA: Diagnosis not present

## 2024-09-21 DIAGNOSIS — N186 End stage renal disease: Secondary | ICD-10-CM | POA: Diagnosis not present

## 2024-09-21 DIAGNOSIS — K56609 Unspecified intestinal obstruction, unspecified as to partial versus complete obstruction: Secondary | ICD-10-CM | POA: Diagnosis not present

## 2024-09-21 DIAGNOSIS — K436 Other and unspecified ventral hernia with obstruction, without gangrene: Secondary | ICD-10-CM | POA: Diagnosis not present

## 2024-09-21 LAB — GLUCOSE, CAPILLARY
Glucose-Capillary: 103 mg/dL — ABNORMAL HIGH (ref 70–99)
Glucose-Capillary: 108 mg/dL — ABNORMAL HIGH (ref 70–99)
Glucose-Capillary: 103 mg/dL — ABNORMAL HIGH (ref 70–99)
Glucose-Capillary: 102 mg/dL — ABNORMAL HIGH (ref 70–99)
Glucose-Capillary: 91 mg/dL (ref 70–99)

## 2024-09-21 LAB — PHOSPHORUS: Phosphorus: 10 mg/dL — ABNORMAL HIGH (ref 2.5–4.6)

## 2024-09-21 LAB — CBC
HCT: 27.6 % — ABNORMAL LOW (ref 36.0–46.0)
Hemoglobin: 8.6 g/dL — ABNORMAL LOW (ref 12.0–15.0)
MCH: 29.5 pg (ref 26.0–34.0)
MCHC: 31.2 g/dL (ref 30.0–36.0)
MCV: 94.5 fL (ref 80.0–100.0)
Platelets: 259 K/uL (ref 150–400)
RBC: 2.92 MIL/uL — ABNORMAL LOW (ref 3.87–5.11)
RDW: 15.5 % (ref 11.5–15.5)
WBC: 12 K/uL — ABNORMAL HIGH (ref 4.0–10.5)
nRBC: 0 % (ref 0.0–0.2)

## 2024-09-21 LAB — BASIC METABOLIC PANEL WITH GFR
Anion gap: 23 — ABNORMAL HIGH (ref 5–15)
BUN: 76 mg/dL — ABNORMAL HIGH (ref 8–23)
CO2: 23 mmol/L (ref 22–32)
Calcium: 9.4 mg/dL (ref 8.9–10.3)
Chloride: 91 mmol/L — ABNORMAL LOW (ref 98–111)
Creatinine, Ser: 9.97 mg/dL — ABNORMAL HIGH (ref 0.44–1.00)
GFR, Estimated: 4 mL/min — ABNORMAL LOW (ref >60)
Glucose, Bld: 94 mg/dL (ref 70–99)
Potassium: 4.7 mmol/L (ref 3.5–5.1)
Sodium: 137 mmol/L (ref 135–145)

## 2024-09-21 LAB — MAGNESIUM: Magnesium: 2.1 mg/dL (ref 1.7–2.4)

## 2024-09-21 MED ORDER — EPOETIN ALFA-EPBX 4000 UNIT/ML IJ SOLN
4000.0000 [IU] | INTRAMUSCULAR | Status: DC
  Administered 2024-09-21: 4000 [IU] via INTRAVENOUS

## 2024-09-21 MED ORDER — HEPARIN SODIUM (PORCINE) 1000 UNIT/ML IJ SOLN
INTRAMUSCULAR | Status: AC
  Filled 2024-09-21: qty 4

## 2024-09-21 MED ORDER — EPOETIN ALFA-EPBX 4000 UNIT/ML IJ SOLN
INTRAMUSCULAR | Status: AC
  Filled 2024-09-21: qty 1

## 2024-09-21 MED ORDER — NEPRO/CARBSTEADY PO LIQD
237.0000 mL | Freq: Two times a day (BID) | ORAL | Status: DC
  Administered 2024-09-21 – 2024-09-23 (×3): 237 mL via ORAL

## 2024-09-21 MED ORDER — MIDODRINE HCL 5 MG PO TABS
ORAL_TABLET | ORAL | Status: AC
Start: 2024-09-21 — End: 2024-09-21
  Filled 2024-09-21: qty 1

## 2024-09-21 MED ORDER — NEPRO/CARBSTEADY PO LIQD: 237.0000 mL | Freq: Three times a day (TID) | ORAL | Status: DC

## 2024-09-21 MED ORDER — OXYCODONE HCL 5 MG PO TABS
ORAL_TABLET | ORAL | Status: AC
  Filled 2024-09-21: qty 1

## 2024-09-21 MED ORDER — EPOETIN ALFA-EPBX 4000 UNIT/ML IJ SOLN: 4000.0000 [IU] | INTRAMUSCULAR | Status: DC

## 2024-09-21 NOTE — Progress Notes (Signed)
 Progress Note    Brianna [name]  FMW:[id number] DOB: September 25, 1961  DOA: 09/12/2024 PCP: Cecily Katz, PA-C      Brief Narrative:    Medical records reviewed and are as summarized below:  Brianna [name] is a 63 y.o. female with PMH type II DM, ESRD on hemodialysis, OSA on CPAP, chronic pain syndrome, chronic HFpEF, GERD, gout, who was admitted to the hospital for incarcerated abdominal hernia s/p exploratory laparotomy on 09/13/2024.  The hospitalist team was consulted because of tachycardia and respiratory distress. Patient was transferred to stepdown and temporarily placed on BiPAP, later able to wean to nasal cannula at 3 L.  No baseline oxygen use.  Patient also developed postsurgical ileus requiring NG tube placement for decompression.  9/17: Vital stable, blood pressure now improved, she did develop hypotension responded to IV fluid, does not require any pressors.  Started having bowel movement.  Surgery would like to keep NJ tube in for another day due to significant amount of secretions.  Going for dialysis today.  9/18: Hemodynamically stable, borderline soft blood pressure, started on NG tube clamping trial but still having significant secretions.  Small amount of clear liquid was also started.  9/19: Hemodynamically stable, had her dialysis but did not completed the full session, wound VAC was removed by general surgery, surgery is keeping NG tube for another day with clear liquid diet, having bowel movement and passing flatus.  Still having significant discharge with NG tube. PT is recommending SNF  9/20: Hemodynamically stable, NG tube was removed and patient tolerating clear liquid diet.  9/21: Remained hemodynamically stable and tolerating full liquid diet, having bowel movement.  9/22: Remained hemodynamically stable and tolerating renal diet.  Had her dialysis today.  No shortness of breath.  She appears to be at her baseline.  PT is recommending SNF so  awaiting placement.  Case was discussed with her primary team and TRH will sign off.  Please do not hesitate to reconsult if needed.  Assessment/Plan:   Principal Problem:   Incarcerated ventral hernia Active Problems:   ESRD (end stage renal disease) (HCC)   Hernia of abdominal wall   Shock circulatory (HCC)   SBO (small bowel obstruction) (HCC)   Body mass index is 36.4 kg/m.-Class II obesity)  Incarcerated abdominal hernia:  Postoperative ileus. S/p exploratory laparotomy on 09/13/2024.  Repeat abdominal x-ray on 09/14/2024 showed dilated loops of bowel in the upper abdomen consistent with bowel obstruction. NG tube was placed with immediate output. She has been started on empiric IV antibiotics-completed the course Leukocytosis, improving Lactic acidosis: Improved Follow-up with general surgeon. Having bowel movements and passing flatus, NG tube was removed and patient is tolerating advancement in diet  Hypotension: Blood pressure within goal -Started on low-dose midodrine  on 9/18 -Continue with midodrine  - Continue to monitor  Acute respiratory distress: Initially required BiPAP transiently and then wean to nasal cannula, currently saturating 100% on 2 L of oxygen, she uses CPAP at night. - Continue supplemental oxygen-wean as tolerated  Sinus tachycardia: Improved.  CTA chest did not show any evidence of pneumonia or pulmonary embolism.   ESRD: Follow-up with nephrologist for hemodialysis.  Chronic HFpEF: Compensated  Gout: Stable.  Decreased allopurinol  from 300 mg to 100 mg daily (renal dose)  Diet Order             Diet renal with fluid restriction Fluid restriction: 1200 mL Fluid; Room service appropriate? Yes; Fluid consistency: Thin  Diet effective now  Consultants: General Surgeon Nephrology  Procedures: Ex lap on 09/13/2024  Medications:    acetaminophen   1,000 mg Oral Q8H   allopurinol   100 mg Oral Daily   Chlorhexidine   Gluconate Cloth  6 each Topical Q0600   ezetimibe   10 mg Oral Daily   feeding supplement (NEPRO CARB STEADY)  237 mL Oral BID BM   heparin   5,000 Units Subcutaneous Q8H   hydrOXYzine   50 mg Oral Daily   insulin  aspart  0-9 Units Subcutaneous Q4H   midodrine   5 mg Oral TID WC   multivitamin  1 tablet Oral QHS   pantoprazole  (PROTONIX ) IV  40 mg Intravenous Q24H   sertraline   25 mg Oral Daily   thiamine   100 mg Oral Daily   Continuous Infusions:  Anti-infectives (From admission, onward)    Start     Dose/Rate Route Frequency Ordered Stop   09/16/24 1200  vancomycin  (VANCOCIN ) IVPB 1000 mg/200 mL premix  Status:  Discontinued        1,000 mg 200 mL/hr over 60 Minutes Intravenous Every M-W-F (Hemodialysis) 09/15/24 0309 09/16/24 1359   09/15/24 0400  metroNIDAZOLE  (FLAGYL ) IVPB 500 mg  Status:  Discontinued        500 mg 100 mL/hr over 60 Minutes Intravenous Every 12 hours 09/15/24 0255 09/16/24 1359   09/15/24 0400  vancomycin  (VANCOREADY) IVPB 2000 mg/400 mL        2,000 mg 200 mL/hr over 120 Minutes Intravenous  Once 09/15/24 0300 09/15/24 0737   09/15/24 0345  vancomycin  (VANCOCIN ) IVPB 1000 mg/200 mL premix  Status:  Discontinued        1,000 mg 200 mL/hr over 60 Minutes Intravenous  Once 09/15/24 0255 09/15/24 0300   09/15/24 0330  cefTRIAXone  (ROCEPHIN ) 2 g in sodium chloride  0.9 % 100 mL IVPB  Status:  Discontinued        2 g 200 mL/hr over 30 Minutes Intravenous Daily 09/15/24 0255 09/16/24 1359   09/13/24 0800  ceFAZolin  (ANCEF ) IVPB 1 g/50 mL premix  Status:  Discontinued        1 g 100 mL/hr over 30 Minutes Intravenous Every 8 hours 09/13/24 0241 09/13/24 1301   09/12/24 2015  ceFAZolin  (ANCEF ) IVPB 2g/100 mL premix        2 g 200 mL/hr over 30 Minutes Intravenous  Once 09/12/24 1958 09/13/24 0031       Family Communication/Anticipated D/C date and plan/Code Status   DVT prophylaxis: heparin  injection 5,000 Units Start: 09/13/24 0945 SCDs Start: 09/13/24  0242     Code Status: Full Code  Family Communication: Primary team is communicating with family. Disposition Plan: PT is recommending SNF  Subjective:  Patient was seen during the dialysis today.  No new concern.  Tolerating renal diet and having bowel movement.  Objective:    Vitals:   09/21/24 1030 09/21/24 1100 09/21/24 1130 09/21/24 1200  BP: (!) 89/62 100/68  109/64  Pulse: 82  80 82  Resp: 17 19  20   Temp:    97.7 F (36.5 C)  TempSrc:    Oral  SpO2: 100% 100% 100% 100%  Weight:    76.3 kg  Height:       No data found.   Intake/Output Summary (Last 24 hours) at 09/21/2024 1445 Last data filed at 09/21/2024 1200 Gross per 24 hour  Intake --  Output 1000 ml  Net -1000 ml   Filed Weights   09/21/24 0500 09/21/24 0812 09/21/24 1200  Weight: 81.1 kg  77.3 kg 76.3 kg    Exam: General.  Frail lady, in no acute distress. Pulmonary.  Lungs clear bilaterally, normal respiratory effort. CV.  Regular rate and rhythm, no JVD, rub or murmur. Abdomen.  Soft, nontender, nondistended, BS positive. CNS.  Alert and oriented .  No focal neurologic deficit. Extremities.  No edema, no cyanosis, pulses intact and symmetrical.   Data Reviewed:   I have personally reviewed following labs and imaging studies:  Labs: Labs show the following:   Basic Metabolic Panel: Recent Labs  Lab 09/16/24 0327 09/17/24 0455 09/18/24 0610 09/19/24 0730 09/20/24 0540 09/21/24 0436  NA 139 141 140 137 137 137  K 4.5 4.4 4.8 4.5 4.6 4.7  CL 93* 98 96* 95* 92* 91*  CO2 21* 23 22 23  21* 23  GLUCOSE 107* 98 84 79 103* 94  BUN 73* 74* 97* 72* 91* 76*  CREATININE 7.52* 7.04* 8.56* 7.22* 8.56* 9.97*  CALCIUM 9.2 9.2 9.3 9.4 9.6 9.4  MG 2.1  --   --  2.1 2.0 2.1  PHOS 7.0* 8.2* 8.2* 8.2* 8.8* 10.0*   GFR Estimated Creatinine Clearance: 5 mL/min (A) (by C-G formula based on SCr of 9.97 mg/dL (H)). Liver Function Tests: Recent Labs  Lab 09/15/24 0311 09/16/24 0327 09/17/24 0455  09/18/24 0610  AST 50*  --   --   --   ALT <5  --   --   --   ALKPHOS 106  --   --   --   BILITOT 0.9  --   --   --   PROT 7.2  --   --   --   ALBUMIN  3.1* 3.0* 3.4* 3.1*   No results for input(s): LIPASE, AMYLASE in the last 168 hours.  No results for input(s): AMMONIA in the last 168 hours. Coagulation profile Recent Labs  Lab 09/15/24 0311  INR 2.1*    CBC: Recent Labs  Lab 09/15/24 0311 09/16/24 0327 09/17/24 0455 09/18/24 0610 09/21/24 0840  WBC 11.5* 7.9 9.1 10.4 12.0*  NEUTROABS 9.0*  --   --   --   --   HGB 10.1* 9.7* 8.9* 8.9* 8.6*  HCT 30.9* 30.0* 28.3* 27.6* 27.6*  MCV 92.2 92.6 94.6 93.9 94.5  PLT 243 198 211 196 259   Cardiac Enzymes: No results for input(s): CKTOTAL, CKMB, CKMBINDEX, TROPONINI in the last 168 hours. BNP (last 3 results) No results for input(s): PROBNP in the last 8760 hours. CBG: Recent Labs  Lab 09/20/24 2001 09/20/24 2351 09/21/24 0402 09/21/24 0808 09/21/24 1238  GLUCAP 113* 97 103* 108* 103*   D-Dimer: No results for input(s): DDIMER in the last 72 hours. Hgb A1c: No results for input(s): HGBA1C in the last 72 hours.  Lipid Profile: No results for input(s): CHOL, HDL, LDLCALC, TRIG, CHOLHDL, LDLDIRECT in the last 72 hours. Thyroid function studies: No results for input(s): TSH, T4TOTAL, T3FREE, THYROIDAB in the last 72 hours.  Invalid input(s): FREET3  Anemia work up: No results for input(s): VITAMINB12, FOLATE, FERRITIN, TIBC, IRON, RETICCTPCT in the last 72 hours. Sepsis Labs: Recent Labs  Lab 09/15/24 0311 09/15/24 0619 09/15/24 1615 09/15/24 1859 09/16/24 0327 09/17/24 0455 09/18/24 0610 09/21/24 0840  WBC 11.5*  --   --   --  7.9 9.1 10.4 12.0*  LATICACIDVEN 3.6* 1.4 1.0 1.0  --   --   --   --     Microbiology Recent Results (from the past 240 hours)  Culture, blood (x 2)  Status: None   Collection Time: 09/15/24  3:11 AM   Specimen: BLOOD  RIGHT HAND  Result Value Ref Range Status   Specimen Description BLOOD RIGHT HAND  Final   Special Requests   Final    BOTTLES DRAWN AEROBIC ONLY Blood Culture adequate volume   Culture   Final    NO GROWTH 5 DAYS Performed at [facility], [address], [place], [place] [address]    Report Status 09/20/2024 FINAL  Final  Culture, blood (x 2)     Status: None   Collection Time: 09/15/24  3:11 AM   Specimen: BLOOD RIGHT ARM  Result Value Ref Range Status   Specimen Description BLOOD RIGHT ARM  Final   Special Requests   Final    BOTTLES DRAWN AEROBIC ONLY Blood Culture adequate volume   Culture   Final    NO GROWTH 5 DAYS Performed at [facility], [address], [place], [place] [address]    Report Status 09/20/2024 FINAL  Final  MRSA Next Gen by PCR, Nasal     Status: None   Collection Time: 09/15/24  4:16 AM   Specimen: Nasal Mucosa; Nasal Swab  Result Value Ref Range Status   MRSA by PCR Next Gen NOT DETECTED NOT DETECTED Final    Comment: (NOTE) The GeneXpert MRSA Assay (FDA approved for NASAL specimens only), is one component of a comprehensive MRSA colonization surveillance program. It is not intended to diagnose MRSA infection nor to guide or monitor treatment for MRSA infections. Test performance is not FDA approved in patients less than 3 years old. Performed at [facility], [address] [place], [place] [address]     Procedures and diagnostic studies:  No results found.    LOS: 8 days   Amaryllis Dare, MD  [facility]   Pager on [email or web address]. If 7PM-7AM, please contact night-coverage at [email or web address]   09/21/2024, 2:45 PM

## 2024-09-21 NOTE — Progress Notes (Signed)
 [facility]  ROUNDING NOTE   Subjective:   Patient seen and evaluated during dialysis   HEMODIALYSIS FLOWSHEET:  Blood Flow Rate (mL/min): 399 mL/min Arterial Pressure (mmHg): -221.61 mmHg Venous Pressure (mmHg): 197.36 mmHg TMP (mmHg): 4.44 mmHg Ultrafiltration Rate (mL/min): 738 mL/min Dialysate Flow Rate (mL/min): 299 ml/min  Denies pain or discomfort Tolerating small liquid meals.    Objective:  Vital signs in last 24 hours:  Temp:  [97.7 F (36.5 C)-98.8 F (37.1 C)] 97.7 F (36.5 C) (09/22 0812) Pulse Rate:  [80-91] 86 (09/22 0830) Resp:  [16-18] 18 (09/22 0900) BP: (85-137)/(50-77) 97/62 (09/22 0900) SpO2:  [92 %-100 %] 100 % (09/22 0900) Weight:  [77.3 kg-81.1 kg] 77.3 kg (09/22 0812)  Weight change:  Filed Weights   09/18/24 0755 09/21/24 0500 09/21/24 0812  Weight: 85.5 kg 81.1 kg 77.3 kg    Intake/Output: I/O last 3 completed shifts: In: 480 [P.O.:480] Out: -    Intake/Output this shift:  No intake/output data recorded.  Physical Exam: General: NAD  Head: Normocephalic  Eyes: Anicteric  Lungs:  Clear, 2L   Heart: Regular rate  Abdomen:  Soft  Extremities:  No peripheral edema.  Neurologic: Alert, oriented  Skin: No rashes  Access: Rt chest permcath, Left AVF maturing    Basic Metabolic Panel: Recent Labs  Lab 09/16/24 0327 09/17/24 0455 09/18/24 0610 09/19/24 0730 09/20/24 0540 09/21/24 0436  NA 139 141 140 137 137 137  K 4.5 4.4 4.8 4.5 4.6 4.7  CL 93* 98 96* 95* 92* 91*  CO2 21* 23 22 23  21* 23  GLUCOSE 107* 98 84 79 103* 94  BUN 73* 74* 97* 72* 91* 76*  CREATININE 7.52* 7.04* 8.56* 7.22* 8.56* 9.97*  CALCIUM 9.2 9.2 9.3 9.4 9.6 9.4  MG 2.1  --   --  2.1 2.0 2.1  PHOS 7.0* 8.2* 8.2* 8.2* 8.8* 10.0*    Liver Function Tests: Recent Labs  Lab 09/15/24 0311 09/16/24 0327 09/17/24 0455 09/18/24 0610  AST 50*  --   --   --   ALT <5  --   --   --   ALKPHOS 106  --   --   --   BILITOT 0.9  --   --   --    PROT 7.2  --   --   --   ALBUMIN  3.1* 3.0* 3.4* 3.1*   No results for input(s): LIPASE, AMYLASE in the last 168 hours. No results for input(s): AMMONIA in the last 168 hours.  CBC: Recent Labs  Lab 09/15/24 0311 09/16/24 0327 09/17/24 0455 09/18/24 0610 09/21/24 0840  WBC 11.5* 7.9 9.1 10.4 12.0*  NEUTROABS 9.0*  --   --   --   --   HGB 10.1* 9.7* 8.9* 8.9* 8.6*  HCT 30.9* 30.0* 28.3* 27.6* 27.6*  MCV 92.2 92.6 94.6 93.9 94.5  PLT 243 198 211 196 259    Cardiac Enzymes: No results for input(s): CKTOTAL, CKMB, CKMBINDEX, TROPONINI in the last 168 hours.  BNP: Invalid input(s): POCBNP  CBG: Recent Labs  Lab 09/20/24 1541 09/20/24 2001 09/20/24 2351 09/21/24 0402 09/21/24 0808  GLUCAP 85 113* 97 103* 108*    Microbiology: Results for orders placed or performed during the hospital encounter of 09/12/24  Culture, blood (x 2)     Status: None   Collection Time: 09/15/24  3:11 AM   Specimen: BLOOD RIGHT HAND  Result Value Ref Range Status   Specimen Description BLOOD RIGHT HAND  Final   Special Requests   Final    BOTTLES DRAWN AEROBIC ONLY Blood Culture adequate volume   Culture   Final    NO GROWTH 5 DAYS Performed at [facility], [address] [place], [place] [address]    Report Status 09/20/2024 FINAL  Final  Culture, blood (x 2)     Status: None   Collection Time: 09/15/24  3:11 AM   Specimen: BLOOD RIGHT ARM  Result Value Ref Range Status   Specimen Description BLOOD RIGHT ARM  Final   Special Requests   Final    BOTTLES DRAWN AEROBIC ONLY Blood Culture adequate volume   Culture   Final    NO GROWTH 5 DAYS Performed at [facility], [address], [place], [place] [address]    Report Status 09/20/2024 FINAL  Final  MRSA Next Gen by PCR, Nasal     Status: None   Collection Time: 09/15/24  4:16 AM   Specimen: Nasal Mucosa; Nasal Swab  Result Value Ref Range Status   MRSA by PCR Next Gen NOT DETECTED NOT  DETECTED Final    Comment: (NOTE) The GeneXpert MRSA Assay (FDA approved for NASAL specimens only), is one component of a comprehensive MRSA colonization surveillance program. It is not intended to diagnose MRSA infection nor to guide or monitor treatment for MRSA infections. Test performance is not FDA approved in patients less than 47 years old. Performed at [facility], [address] [place], [place] [address]     Coagulation Studies: No results for input(s): LABPROT, INR in the last 72 hours.  Urinalysis: No results for input(s): COLORURINE, LABSPEC, PHURINE, GLUCOSEU, HGBUR, BILIRUBINUR, KETONESUR, PROTEINUR, UROBILINOGEN, NITRITE, LEUKOCYTESUR in the last 72 hours.  Invalid input(s): APPERANCEUR    Imaging: No results found.   Medications:     acetaminophen   1,000 mg Oral Q8H   allopurinol   100 mg Oral Daily   Chlorhexidine  Gluconate Cloth  6 each Topical Q0600   epoetin  alfa-epbx (RETACRIT ) injection  4,000 Units Intravenous Q M,W,F-1800   ezetimibe   10 mg Oral Daily   feeding supplement (NEPRO CARB STEADY)  237 mL Oral BID BM   heparin   5,000 Units Subcutaneous Q8H   hydrOXYzine   50 mg Oral Daily   insulin  aspart  0-9 Units Subcutaneous Q4H   midodrine   5 mg Oral TID WC   multivitamin  1 tablet Oral QHS   pantoprazole  (PROTONIX ) IV  40 mg Intravenous Q24H   sertraline   25 mg Oral Daily   thiamine   100 mg Oral Daily   albuterol , ALPRAZolam , HYDROmorphone  (DILAUDID ) injection, methocarbamol  (ROBAXIN ) injection, ondansetron  (ZOFRAN ) IV, ondansetron , oxyCODONE , oxyCODONE , zolpidem   Assessment/ Plan:  [name] is a 63 y.o.  female  with past medical conditions including obstructive sleep apnea on CPAP, gout, GERD, type 2 diabetes, and end-stage renal disease on hemodialysis.  Patient presented to the hospital due to incarcerated and obstructed ventral hernia, experienced exploratory laparotomy on 9/13.  Patient  is currently admitted for SBO (small bowel obstruction) (HCC) [K56.609] Hernia of abdominal wall [K43.9] Incarcerated ventral hernia [K43.6]   [facility]/MWF/right IJ PermCath/left upper AVF.    End-stage renal disease on hemodialysis.  Continue MWF schedule. Receiving dialysis today, UF 1L as tolerated. Next treatment scheduled for Wednesday    2. Acute respiratory failure, placed on Bipap in ICU.  Now weaned to nasal cannula.  Remains on 2 L/min without signs of distress.    3. Anemia of  chronic kidney disease Hemoglobin & Hematocrit     Component Value Date/Time   HGB 8.6 (L) 09/21/2024 0840   HGB 14.6 04/22/2013 1549   HCT 27.6 (L) 09/21/2024 0840   HCT 44.0 04/22/2013 1549    Hgb decreased, continue low-dose EPO with dialysis 4000 units   4.  Incarcerated ventral hernia status post exploratory laparotomy on 9/13.  Wound VAC and NGT placed.  General surgery continues to follow and offer supportive measures and pain management. Surgery has removed NGT. Diet advanced to full liquids.    5. Secondary Hyperparathyroidism: with outpatient labs: PTH 993, phosphorus 4.9, calcium 9.5 on 09/07/24.   Sevelamer  has been held due to GI concerns. Phos now 10. Will continue to monitor when will be appropriate to restart. Encourage low phos diet     LOS: 8 Brianna [name] 9/22/20259:26 AM

## 2024-09-21 NOTE — Progress Notes (Signed)
 Nutrition Follow-up  DOCUMENTATION CODES:   Obesity unspecified  INTERVENTION:   -Renal MVI daily -Nepro Shake po BID, each supplement provides 425 kcal and 19 grams protein  -Liberalize diet to renal with 1.2 L fluid restriction -Continue 100 mg thiamine  daily x 7 days  NUTRITION DIAGNOSIS:   Inadequate oral intake related to altered GI function as evidenced by NPO status.  Ongoing  GOAL:   Patient will meet greater than or equal to 90% of their needs  Progressing   MONITOR:   Diet advancement, Labs, Weight trends, Skin, I & O's  REASON FOR ASSESSMENT:   NPO/Clear Liquid Diet    ASSESSMENT:   63 y/o female with h/o DM, HTN, ESRD on HD, CHF, morbid obesity, chronic pain, anxiety, depression, GERD and strangulated ventral hernia s/p small bowel resection (21cm in 2021) and who is admitted with incarcerated ventral hernia with obstruction now s/p exploratory laparotomy, reduction of incarcerated and obstructed hernia, repair of ventral hernias, lysis of adhesions & omentectomy 9/14 complicated by post op ileus.  9/14- s/p Exploratory laparotomy, reduction of incarcerated and obstructed hernia, repair of ventral hernias measuring approximately 15 cm, lysis of adhesions for 2 hours, omentectomy  9/19- NGT clamped, clear liquid diet 9/20- NGT d/c 9/21- full liquid diet 9/22- renal, carb modified diet  Reviewed I/O's: +480 ml x 24 hours and +3 L since admission  Pt has been advanced to a renal, carb modified diet. Noted meal completions variable; po 10-100%.   Wt has been stable since admission. Unsure of EDW.   Medications reviewed and include protonix  and thiamine .   Per TOC notes, plan for SNF placement at discharge.   Labs reviewed: CBGS: 97-131 (inpatient orders for glycemic control are none).    Diet Order:   Diet Order             Diet renal/carb modified with fluid restriction Diet-HS Snack? Nothing; Fluid restriction: 1200 mL Fluid; Room service  appropriate? Yes; Fluid consistency: Thin  Diet effective now                   EDUCATION NEEDS:   No education needs have been identified at this time  Skin:  Skin Assessment: Skin Integrity Issues: Skin Integrity Issues:: Incisions Incisions: closed abdomen  Last BM:  09/20/24 (type 7)  Height:   Ht Readings from Last 1 Encounters:  09/12/24 4' 9 (1.448 m)    Weight:   Wt Readings from Last 1 Encounters:  09/21/24 81.1 kg    Ideal Body Weight:  43 kg  BMI:  Body mass index is 38.69 kg/m.  Estimated Nutritional Needs:   Kcal:  1700-1900  Protein:  85-100 grams  Fluid:  1000 ml + UOP    Margery ORN, RD, LDN, CDCES Registered Dietitian III Certified Diabetes Care and Education Specialist If unable to reach this RD, please use RD Inpatient group chat on secure chat between hours of 8am-4 pm daily

## 2024-09-21 NOTE — Progress Notes (Signed)
 Mobility Specialist - Progress Note   09/21/24 1530  Mobility  Activity Ambulated with assistance;Pivoted/transferred from chair to bed  Level of Assistance Contact guard assist, steadying assist  Assistive Device Front wheel walker  Distance Ambulated (ft) 4 ft  Activity Response Tolerated well  Mobility visit 1 Mobility  Mobility Specialist Start Time (ACUTE ONLY) 1515  Mobility Specialist Stop Time (ACUTE ONLY) 1525  Mobility Specialist Time Calculation (min) (ACUTE ONLY) 10 min   Pt transferred from the recliner to via w/ CGA--- tolerated well. Pt left in fowler position with alarm set and needs within the reach.  America Silvan Mobility Specialist 09/21/24 3:31 PM

## 2024-09-21 NOTE — Plan of Care (Signed)
  Problem: Education: Goal: Knowledge of General Education information will improve Description: Including pain rating scale, medication(s)/side effects and non-pharmacologic comfort measures Outcome: Progressing   Problem: Clinical Measurements: Goal: Ability to maintain clinical measurements within normal limits will improve Outcome: Progressing   Problem: Clinical Measurements: Goal: Will remain free from infection Outcome: Progressing   Problem: Clinical Measurements: Goal: Diagnostic test results will improve Outcome: Progressing   Problem: Clinical Measurements: Goal: Respiratory complications will improve Outcome: Progressing   Problem: Clinical Measurements: Goal: Cardiovascular complication will be avoided Outcome: Progressing   Problem: Activity: Goal: Risk for activity intolerance will decrease Outcome: Progressing   Problem: Nutrition: Goal: Adequate nutrition will be maintained Outcome: Progressing   Problem: Coping: Goal: Level of anxiety will decrease Outcome: Progressing

## 2024-09-21 NOTE — Progress Notes (Signed)
 Hemodialysis Note:  Received patient in bed to unit. Alert and oriented. Informed consent singed and in chart.  Treatment initiated: 0830 Treatment completed: 1200  Access used: Right internal jugular catheter Access issues: None  Patient tolerated well. Transported back to room, alert without acute distress. Report given to patient's RN.  Total UF removed: 1 Liter Medications given: Midodrine  5 mg Tablet, Retacrit  4000 units IV, Oxycodone  5 mg Tablet  Post HD weight: 76.3 kg  [facility]

## 2024-09-21 NOTE — Progress Notes (Signed)
 OT Cancellation Note  Patient Details Name: Brianna [name] MRN: [id number] DOB: 05-01-1961   Cancelled Treatment:    Reason Eval/Treat Not Completed: Patient at procedure or test/ unavailable. Pt is currently OTF at HD, will follow up as available/appropriate.  Hendrix Console E Elowen Debruyn 09/21/2024, 10:03 AM

## 2024-09-21 NOTE — Progress Notes (Signed)
 [facility] SURGICAL PROGRESS NOTE  Hospital Day(s): 8.   Post op day(s): 9 Days Post-Op.   Interval History:  Patient seen and examined No acute issues overnight She reports she is doing well Abdominal soreness is 4/10 at most  No nausea or emesis Labs consistent with known ESRD She has continued to have bowel function    Vital signs in last 24 hours: [min-max] current  Temp:  [98.1 F (36.7 C)-98.8 F (37.1 C)] 98.8 F (37.1 C) (09/22 0446) Pulse Rate:  [84-91] 88 (09/22 0446) Resp:  [18] 18 (09/22 0446) BP: (104-137)/(58-65) 137/65 (09/22 0446) SpO2:  [92 %-100 %] 92 % (09/22 0446) Weight:  [81.1 kg] 81.1 kg (09/22 0500)     Height: 4' 9 (144.8 cm) Weight: 81.1 kg BMI (Calculated): 38.68   Intake/Output last 2 shifts:  09/21 0701 - 09/22 0700 In: 480 [P.O.:480] Out: -    Physical Exam:  Constitutional: alert, cooperative and no distress  Respiratory: She is on [place], no respiratory distress Cardiovascular: normal rate and sinus rhythm  Gastrointestinal: soft, non-tender, and non-distended, no rebound/guarding.  Integumentary: Midline intact with staples, no erythema   Labs:     Latest Ref Rng & Units 09/18/2024    6:10 AM 09/17/2024    4:55 AM 09/16/2024    3:27 AM  CBC  WBC 4.0 - 10.5 K/uL 10.4  9.1  7.9   Hemoglobin 12.0 - 15.0 g/dL 8.9  8.9  9.7   Hematocrit 36.0 - 46.0 % 27.6  28.3  30.0   Platelets 150 - 400 K/uL 196  211  198       Latest Ref Rng & Units 09/21/2024    4:36 AM 09/20/2024    5:40 AM 09/19/2024    7:30 AM  CMP  Glucose 70 - 99 mg/dL 94  896  79   BUN 8 - 23 mg/dL 76  91  72   Creatinine 0.44 - 1.00 mg/dL 0.02  1.43  2.77   Sodium 135 - 145 mmol/L 137  137  137   Potassium 3.5 - 5.1 mmol/L 4.7  4.6  4.5   Chloride 98 - 111 mmol/L 91  92  95   CO2 22 - 32 mmol/L 23  21  23    Calcium 8.9 - 10.3 mg/dL 9.4  9.6  9.4     Imaging studies:  No new imaging studies    Assessment/Plan:  63 y.o. female 9 Days Post-Op s/p  exploratory laparotomy, reduction of incarcerated ventral hernia, and placement of Prevena wound vac complicated by pertinent comorbidities including ESRD.    - Will advance to renal diet   - Monitor abdominal examination; on-going bowel function   - Pain control prn; Antiemetics prn             - Okay to mobilize; continue abdominal binder              - Appreciate nephrology assistance with ESRD, HD  - Appreciate medicine assistance   - Discharge Planning: Diet advanced and having bowel function. I do think we can progress towards discharge now from surgical perspective. Likely needs SNF.    All of the above findings and recommendations were discussed with the patient, and the medical team, and all of patient's questions were answered to her expressed satisfaction.  -- [name], PA-C [facility] 09/21/2024, 7:34 AM M-F: 7am - 4pm

## 2024-09-22 LAB — GLUCOSE, CAPILLARY
Glucose-Capillary: 109 mg/dL — ABNORMAL HIGH (ref 70–99)
Glucose-Capillary: 125 mg/dL — ABNORMAL HIGH (ref 70–99)
Glucose-Capillary: 90 mg/dL (ref 70–99)
Glucose-Capillary: 111 mg/dL — ABNORMAL HIGH (ref 70–99)
Glucose-Capillary: 111 mg/dL — ABNORMAL HIGH (ref 70–99)
Glucose-Capillary: 104 mg/dL — ABNORMAL HIGH (ref 70–99)

## 2024-09-22 MED ORDER — INSULIN ASPART 100 UNIT/ML IJ SOLN
0.0000 [IU] | Freq: Three times a day (TID) | INTRAMUSCULAR | Status: DC
  Administered 2024-09-24: 1 [IU] via SUBCUTANEOUS
  Filled 2024-09-22: qty 1

## 2024-09-22 MED ORDER — PANTOPRAZOLE SODIUM 40 MG PO TBEC
40.0000 mg | DELAYED_RELEASE_TABLET | Freq: Every day | ORAL | Status: DC
  Administered 2024-09-23 – 2024-09-24 (×2): 40 mg via ORAL
  Filled 2024-09-22 (×2): qty 1

## 2024-09-22 NOTE — Progress Notes (Signed)
 [facility] SURGICAL PROGRESS NOTE  Hospital Day(s): 9.   Post op day(s): 10 Days Post-Op.   Interval History:  Patient seen and examined No acute issues overnight She reports she is doing very well No significant pain No nausea or emesis No new labs this morning  She has continued to have bowel function Awaiting placement   Vital signs in last 24 hours: [min-max] current  Temp:  [97.7 F (36.5 C)-99 F (37.2 C)] 98.2 F (36.8 C) (09/23 0537) Pulse Rate:  [72-90] 72 (09/23 0537) Resp:  [16-20] 16 (09/23 0537) BP: (82-143)/(35-77) 128/70 (09/23 0537) SpO2:  [99 %-100 %] 100 % (09/23 0537) Weight:  [76.3 kg-78.3 kg] 78.3 kg (09/23 0500)     Height: 4' 9 (144.8 cm) Weight: 78.3 kg BMI (Calculated): 37.34   Intake/Output last 2 shifts:  09/22 0701 - 09/23 0700 In: 480 [P.O.:480] Out: 1000    Physical Exam:  Constitutional: alert, cooperative and no distress  Respiratory: She is on [place], no respiratory distress Cardiovascular: normal rate and sinus rhythm  Gastrointestinal: soft, non-tender, and non-distended, no rebound/guarding.  Integumentary: Midline intact with staples, no erythema   Labs:     Latest Ref Rng & Units 09/21/2024    8:40 AM 09/18/2024    6:10 AM 09/17/2024    4:55 AM  CBC  WBC 4.0 - 10.5 K/uL 12.0  10.4  9.1   Hemoglobin 12.0 - 15.0 g/dL 8.6  8.9  8.9   Hematocrit 36.0 - 46.0 % 27.6  27.6  28.3   Platelets 150 - 400 K/uL 259  196  211       Latest Ref Rng & Units 09/21/2024    4:36 AM 09/20/2024    5:40 AM 09/19/2024    7:30 AM  CMP  Glucose 70 - 99 mg/dL 94  896  79   BUN 8 - 23 mg/dL 76  91  72   Creatinine 0.44 - 1.00 mg/dL 0.02  1.43  2.77   Sodium 135 - 145 mmol/L 137  137  137   Potassium 3.5 - 5.1 mmol/L 4.7  4.6  4.5   Chloride 98 - 111 mmol/L 91  92  95   CO2 22 - 32 mmol/L 23  21  23    Calcium 8.9 - 10.3 mg/dL 9.4  9.6  9.4     Imaging studies:  No new imaging studies    Assessment/Plan:  63 y.o. female 10 Days  Post-Op s/p exploratory laparotomy, reduction of incarcerated ventral hernia, and placement of Prevena wound vac complicated by pertinent comorbidities including ESRD.    - Okay to continue renal diet   - Monitor abdominal examination; on-going bowel function   - Pain control prn; Antiemetics prn             - Okay to mobilize; continue abdominal binder              - Appreciate nephrology assistance with ESRD, HD  - Appreciate medicine assistance   - Discharge Planning: Stable for DC from surgical perspective; awaiting placement   All of the above findings and recommendations were discussed with the patient, and the medical team, and all of patient's questions were answered to her expressed satisfaction.  -- Brianna Platt, PA-C [facility] 09/22/2024, 7:43 AM M-F: 7am - 4pm

## 2024-09-22 NOTE — Progress Notes (Signed)
 [facility]  ROUNDING NOTE   Subjective:   Patient seen sitting at side of bed Alert and oriented Tolerating small meals Complains of mild abdominal discomfort   Objective:  Vital signs in last 24 hours:  Temp:  [97.7 F (36.5 C)-99 F (37.2 C)] 97.7 F (36.5 C) (09/23 0852) Pulse Rate:  [72-90] 85 (09/23 0852) Resp:  [15-18] 15 (09/23 0852) BP: (95-143)/(58-70) 106/58 (09/23 0852) SpO2:  [100 %] 100 % (09/23 0852) Weight:  [78.3 kg] 78.3 kg (09/23 0500)  Weight change: -3.8 kg Filed Weights   09/21/24 0812 09/21/24 1200 09/22/24 0500  Weight: 77.3 kg 76.3 kg 78.3 kg    Intake/Output: I/O last 3 completed shifts: In: 480 [P.O.:480] Out: 1000 [Other:1000]   Intake/Output this shift:  Total I/O In: 200 [P.O.:200] Out: -   Physical Exam: General: NAD  Head: Normocephalic  Eyes: Anicteric  Lungs:  Clear, room air  Heart: Regular rate  Abdomen:  Soft  Extremities:  No peripheral edema.  Neurologic: Alert, oriented  Skin: No rashes, abd surgical incision   Access: Rt chest permcath, Left AVF maturing    Basic Metabolic Panel: Recent Labs  Lab 09/16/24 0327 09/17/24 0455 09/18/24 0610 09/19/24 0730 09/20/24 0540 09/21/24 0436  NA 139 141 140 137 137 137  K 4.5 4.4 4.8 4.5 4.6 4.7  CL 93* 98 96* 95* 92* 91*  CO2 21* 23 22 23  21* 23  GLUCOSE 107* 98 84 79 103* 94  BUN 73* 74* 97* 72* 91* 76*  CREATININE 7.52* 7.04* 8.56* 7.22* 8.56* 9.97*  CALCIUM 9.2 9.2 9.3 9.4 9.6 9.4  MG 2.1  --   --  2.1 2.0 2.1  PHOS 7.0* 8.2* 8.2* 8.2* 8.8* 10.0*    Liver Function Tests: Recent Labs  Lab 09/16/24 0327 09/17/24 0455 09/18/24 0610  ALBUMIN  3.0* 3.4* 3.1*   No results for input(s): LIPASE, AMYLASE in the last 168 hours. No results for input(s): AMMONIA in the last 168 hours.  CBC: Recent Labs  Lab 09/16/24 0327 09/17/24 0455 09/18/24 0610 09/21/24 0840  WBC 7.9 9.1 10.4 12.0*  HGB 9.7* 8.9* 8.9* 8.6*  HCT 30.0* 28.3* 27.6*  27.6*  MCV 92.6 94.6 93.9 94.5  PLT 198 211 196 259    Cardiac Enzymes: No results for input(s): CKTOTAL, CKMB, CKMBINDEX, TROPONINI in the last 168 hours.  BNP: Invalid input(s): POCBNP  CBG: Recent Labs  Lab 09/21/24 2054 09/22/24 0043 09/22/24 0442 09/22/24 0849 09/22/24 1156  GLUCAP 91 109* 125* 90 111*    Microbiology: Results for orders placed or performed during the hospital encounter of 09/12/24  Culture, blood (x 2)     Status: None   Collection Time: 09/15/24  3:11 AM   Specimen: BLOOD RIGHT HAND  Result Value Ref Range Status   Specimen Description BLOOD RIGHT HAND  Final   Special Requests   Final    BOTTLES DRAWN AEROBIC ONLY Blood Culture adequate volume   Culture   Final    NO GROWTH 5 DAYS Performed at [facility], [address], [place], [place] [address]    Report Status 09/20/2024 FINAL  Final  Culture, blood (x 2)     Status: None   Collection Time: 09/15/24  3:11 AM   Specimen: BLOOD RIGHT ARM  Result Value Ref Range Status   Specimen Description BLOOD RIGHT ARM  Final   Special Requests   Final    BOTTLES DRAWN AEROBIC ONLY Blood Culture adequate volume  Culture   Final    NO GROWTH 5 DAYS Performed at [facility], [address] [place], [place] [address]    Report Status 09/20/2024 FINAL  Final  MRSA Next Gen by PCR, Nasal     Status: None   Collection Time: 09/15/24  4:16 AM   Specimen: Nasal Mucosa; Nasal Swab  Result Value Ref Range Status   MRSA by PCR Next Gen NOT DETECTED NOT DETECTED Final    Comment: (NOTE) The GeneXpert MRSA Assay (FDA approved for NASAL specimens only), is one component of a comprehensive MRSA colonization surveillance program. It is not intended to diagnose MRSA infection nor to guide or monitor treatment for MRSA infections. Test performance is not FDA approved in patients less than 42 years old. Performed at [id number], [address] [place], [place]  [address]     Coagulation Studies: No results for input(s): LABPROT, INR in the last 72 hours.  Urinalysis: No results for input(s): COLORURINE, LABSPEC, PHURINE, GLUCOSEU, HGBUR, BILIRUBINUR, KETONESUR, PROTEINUR, UROBILINOGEN, NITRITE, LEUKOCYTESUR in the last 72 hours.  Invalid input(s): APPERANCEUR    Imaging: No results found.   Medications:     acetaminophen   1,000 mg Oral Q8H   allopurinol   100 mg Oral Daily   Chlorhexidine  Gluconate Cloth  6 each Topical Q0600   ezetimibe   10 mg Oral Daily   feeding supplement (NEPRO CARB STEADY)  237 mL Oral BID BM   heparin   5,000 Units Subcutaneous Q8H   hydrOXYzine   50 mg Oral Daily   insulin  aspart  0-9 Units Subcutaneous TID WC & HS   midodrine   5 mg Oral TID WC   multivitamin  1 tablet Oral QHS   [START ON 09/23/2024] pantoprazole   40 mg Oral Daily   sertraline   25 mg Oral Daily   thiamine   100 mg Oral Daily   albuterol , ALPRAZolam , HYDROmorphone  (DILAUDID ) injection, methocarbamol  (ROBAXIN ) injection, ondansetron  (ZOFRAN ) IV, ondansetron , oxyCODONE , oxyCODONE , zolpidem   Assessment/ Plan:  Brianna [name] is a 63 y.o.  female  with past medical conditions including obstructive sleep apnea on CPAP, gout, GERD, type 2 diabetes, and end-stage renal disease on hemodialysis.  Patient presented to the hospital due to incarcerated and obstructed ventral hernia, experienced exploratory laparotomy on 9/13.  Patient is currently admitted for SBO (small bowel obstruction) (HCC) [K56.609] Hernia of abdominal wall [K43.9] Incarcerated ventral hernia [K43.6]   [facility]/MWF/right IJ PermCath/left upper AVF.    End-stage renal disease on hemodialysis.  Continue MWF schedule. Received dialysis yesterday, UF 1L achieved. Next treatment scheduled for Wednesday    2. Acute respiratory failure, placed on Bipap in ICU.   Weaned to room air   3. Anemia of chronic kidney disease Hemoglobin &  Hematocrit     Component Value Date/Time   HGB 8.6 (L) 09/21/2024 0840   HGB 14.6 04/22/2013 1549   HCT 27.6 (L) 09/21/2024 0840   HCT 44.0 04/22/2013 1549    Hgb below optimal range, continue low-dose EPO with dialysis 4000 units   4.  Incarcerated ventral hernia status post exploratory laparotomy on 9/13.  General surgery continues to follow and offer supportive measures and pain management. Surgery has removed NGT and wound vac. Diet advanced to solid foods.    5. Secondary Hyperparathyroidism: with outpatient labs: PTH 993, phosphorus 4.9, calcium 9.5 on 09/07/24.   Sevelamer  has been held due to GI concerns. Phos now 10. Will continue to monitor when will be appropriate to restart. Encourage  low phos diet     LOS: 9 Khylie Larmore 9/23/20251:20 PM

## 2024-09-22 NOTE — Progress Notes (Signed)
 Mobility Specialist - Progress Note   09/22/24 1126  Mobility  Activity Pivoted/transferred to/from [facility]  Level of Assistance Contact guard assist, steadying assist  Assistive Device Front wheel walker  Distance Ambulated (ft) 4 ft  Activity Response Tolerated well  Mobility visit 1 Mobility  Mobility Specialist Start Time (ACUTE ONLY) 1021  Mobility Specialist Stop Time (ACUTE ONLY) 1041  Mobility Specialist Time Calculation (min) (ACUTE ONLY) 20 min   Pt transferred to/from the [facility] via SPT CGA-MinG--- extra time required to bring trunk from sup to sit, MinA to bring BLE EOB. Pt denied sitting in the recliner this date d/t bilat foot pain, opting to return to bed. Pt left semi fowler with alarm set and needs within reach. RN notified.  America Silvan Mobility Specialist 09/22/24 11:32 AM

## 2024-09-22 NOTE — TOC Progression Note (Signed)
 Transition of Care [facility]) - Progression Note    Patient Details  Name: Brianna [name] MRN: [id number] Date of Birth: 1961-10-23  Transition of Care [facility]) CM/SW Contact  Corean ONEIDA Haddock, RN Phone Number: 09/22/2024, 12:02 PM  Clinical Narrative:      Per Surgery patient appropriate for SNF auth to be initiated Nitchia with [facility] Care Management to start auth Patient in agreement with plan  Accepted in HUB,  notified Tammy at [facility]   Expected Discharge Plan: Skilled Nursing Facility Barriers to Discharge: Continued Medical Work up               Expected Discharge Plan and Services     Post Acute Care Choice: Skilled Nursing Facility Living arrangements for the past 2 months: Single Family Home                                       Social Drivers of Health (SDOH) Interventions SDOH Screenings   Food Insecurity: No Food Insecurity (09/18/2024)  Housing: Low Risk  (09/18/2024)  Transportation Needs: No Transportation Needs (09/18/2024)  Utilities: Not At Risk (09/18/2024)  Depression (PHQ2-9): Low Risk  (09/08/2024)  Financial Resource Strain: High Risk (06/04/2024)   Received from [facility]  Social Connections: Unknown (09/18/2024)  Tobacco Use: Low Risk  (09/12/2024)    Readmission Risk Interventions     No data to display

## 2024-09-22 NOTE — Progress Notes (Signed)
 Physical Therapy Treatment Patient Details Name: Brianna [name] MRN: [id number] DOB: 03/21/1961 Today's Date: 09/22/2024   History of Present Illness Pt is a 63 year old female she underwent exploratory laparotomy with Dr. Marinda in general surgery on 09/12/24 for an incarcerated ventral hernia. Her post-op hospital course was complicated by persistent tachycardia, respiratory distress requiring BiPAP support, hypotension and her ESRD. She remains on BiPAP this morning and BP is overall improving without vasopressor support.    PMH significant for ESRD on dialysis, HFpEF, DM type II, and OSA on CPAP    PT Comments  Pt was supine (HOB elevated ~ 20 degrees) upon arrival. She is A and O x 3. Pleasant and cooperative but does have some slow processing at times. She asked appropriate questions during session and remains engaged. Pt is on 2 L o2 at baseline and remains on 2 L throughout session. Noted abdominal binder oprder however the binder in her room is too small. Will need larger abdominal binder if still needed. Pt was agreeable to OOB activity. Required extensive assistance to exit L side of bed. Sat EOB x ~ 5 minutes prior to standing 2 x to RW. Pt perform EOB standing exercises prior to ambulating ~ 8 ft. Distance limited by pt having loose watery BM and needing to get to [facility]. Pt had small successful BM prior to returning to bed. Overall pt states, Im moving better today, However remains weak overall. DC recs remain appropriate to maximize independence and safety with all ADLs    If plan is discharge home, recommend the following: A little help with walking and/or transfers;A lot of help with bathing/dressing/bathroom;Assistance with cooking/housework;Direct supervision/assist for medications management;Direct supervision/assist for financial management;Assist for transportation;Help with stairs or ramp for entrance     Equipment Recommendations  Rolling walker (2 wheels) (youth/pediatric)        Precautions / Restrictions Precautions Precautions: Fall Recall of Precautions/Restrictions: Intact Required Braces or Orthoses: Other Brace (abdominal binder ordreed but inproper fit. Will need larger abdominal binder) Restrictions Weight Bearing Restrictions Per Provider Order: No     Mobility  Bed Mobility Overal bed mobility: Needs Assistance Bed Mobility: Supine to Sit, Sit to Supine  Supine to sit: Mod assist, HOB elevated, Used rails Sit to supine: Contact guard assist, Used rails, HOB elevated General bed mobility comments: increased time and effort to exit L side of bed    Transfers Overall transfer level: Needs assistance Equipment used: Rolling walker (2 wheels) Transfers: Sit to/from Stand Sit to Stand: Min assist  General transfer comment: min assist to stand 3 x from EOB and 1 x from [facility]    Ambulation/Gait Ambulation/Gait assistance: Contact guard assist, Min assist Gait Distance (Feet): 8 Feet Assistive device: Rolling walker (2 wheels) Gait Pattern/deviations: Step-to pattern, Shuffle Gait velocity: decr  General Gait Details: distance limited by incontinence/ BM upon ambulating. sat on BSC. watery/loose stools    Balance Overall balance assessment: Needs assistance Sitting-balance support: Bilateral upper extremity supported, Feet supported Sitting balance-Leahy Scale: Fair     Standing balance support: Bilateral upper extremity supported, During functional activity, Reliant on assistive device for balance Standing balance-Leahy Scale: Fair       Hotel manager: No apparent difficulties  Cognition Arousal: Alert Behavior During Therapy: WFL for tasks assessed/performed   PT - Cognitive impairments: No apparent impairments    PT - Cognition Comments: Pt has slow processing however is A and O x 3. Following commands: Intact Following commands impaired: Follows one  step commands with increased time    Cueing Cueing  Techniques: Verbal cues, Tactile cues     General Comments General comments (skin integrity, edema, etc.): pr remained on 2L o2 throughout session and was at her baseline      Pertinent Vitals/Pain Pain Assessment Pain Assessment: 0-10 Pain Score: 3  Pain Location: low back pain Pain Descriptors / Indicators: Discomfort Pain Intervention(s): Limited activity within patient's tolerance, Monitored during session, Premedicated before session, Repositioned     PT Goals (current goals can now be found in the care plan section) Acute Rehab PT Goals Patient Stated Goal: rehab then home Progress towards PT goals: Progressing toward goals    Frequency    Min 2X/week       Co-evaluation     PT goals addressed during session: Mobility/safety with mobility;Balance;Proper use of DME;Strengthening/ROM        AM-PAC PT 6 Clicks Mobility   Outcome Measure  Help needed turning from your back to your side while in a flat bed without using bedrails?: A Little Help needed moving from lying on your back to sitting on the side of a flat bed without using bedrails?: A Lot Help needed moving to and from a bed to a chair (including a wheelchair)?: A Lot Help needed standing up from a chair using your arms (e.g., wheelchair or bedside chair)?: A Little Help needed to walk in hospital room?: A Little Help needed climbing 3-5 steps with a railing? : A Lot 6 Click Score: 15    End of Session Equipment Utilized During Treatment: Oxygen (2L) Activity Tolerance: Patient tolerated treatment well;Patient limited by fatigue Patient left: in bed;with call bell/phone within reach;with bed alarm set Nurse Communication: Mobility status PT Visit Diagnosis: Unsteadiness on feet (R26.81);Muscle weakness (generalized) (M62.81);History of falling (Z91.81);Difficulty in walking, not elsewhere classified (R26.2)     Time: 9265-9196 PT Time Calculation (min) (ACUTE ONLY): 29 min  Charges:     $Therapeutic Activity: 23-37 mins PT General Charges $$ ACUTE PT VISIT: 1 Visit                     Rankin Essex PTA 09/22/24, 8:25 AM

## 2024-09-22 NOTE — Progress Notes (Signed)
 PHARMACIST - PHYSICIAN COMMUNICATION   Arthea Saras, PA-C  CONCERNING: IV to Oral Route Change Policy  RECOMMENDATION: This patient is receiving pantoprazole  by the intravenous route.  Based on criteria approved by the Pharmacy and Therapeutics Committee, the intravenous medication(s) is/are being converted to the equivalent oral dose form(s).   DESCRIPTION: These criteria include: The patient is eating (either orally or via tube) and/or has been taking other orally administered medications for a least 24 hours The patient has no evidence of active gastrointestinal bleeding or impaired GI absorption (gastrectomy, short bowel, patient on TNA or NPO).  If you have questions about this conversion, please contact the Pharmacy Department   Kayla JULIANNA Blew, [facility] 09/22/2024 11:26 AM

## 2024-09-22 NOTE — Plan of Care (Signed)

## 2024-09-22 NOTE — Progress Notes (Signed)
 Occupational Therapy Treatment Patient Details Name: Brianna [name] MRN: [id number] DOB: 1961-06-11 Today's Date: 09/22/2024   History of present illness Pt is a 63 year old female she underwent exploratory laparotomy with Dr. Marinda in general surgery on 09/12/24 for an incarcerated ventral hernia. Her post-op hospital course was complicated by persistent tachycardia, respiratory distress requiring BiPAP support, hypotension and her ESRD. She remains on BiPAP this morning and BP is overall improving without vasopressor support.    PMH significant for ESRD on dialysis, HFpEF, DM type II, and OSA on CPAP   OT comments  Pt is supine in bed on arrival. Pleasant and agreeable to OT session. She reports 8/10 lower back pain and requests pain meds from nurse-OT notified. Pt performed bed mobility tasks with Min A, cues for hand placement and increased time. Pt engaged in routine ADL session for bathing/dressing. She was able to perform UB bathing with SBA seated at EOB and LB bathing with Mod A. Pt demo STS from EOB to RW with Min A and maintained balance with CGA and unilateral support on RW for peri-area bathing. She was able to lateral step towards [facility] and return to supine with CGA. She continues to fatigue easily and require increased assist with LB ADL management.  Pt returned to bed with all needs in place and will cont to require skilled acute OT services to maximize her safety and IND to return to PLOF.       If plan is discharge home, recommend the following:  A little help with walking and/or transfers;A little help with bathing/dressing/bathroom   Equipment Recommendations  BSC/3in1    Recommendations for Other Services      Precautions / Restrictions Precautions Precautions: Fall Recall of Precautions/Restrictions: Intact Required Braces or Orthoses: Other Brace (abdominal binder ordreed but inproper fit. Will need larger abdominal binder) Restrictions Weight Bearing Restrictions  Per Provider Order: No       Mobility Bed Mobility Overal bed mobility: Needs Assistance Bed Mobility: Supine to Sit     Supine to sit: HOB elevated, Used rails, Min assist Sit to supine: Contact guard assist, Used rails, HOB elevated   General bed mobility comments: exited L side of bed with increased time/effort needed    Transfers Overall transfer level: Needs assistance Equipment used: Rolling walker (2 wheels) Transfers: Sit to/from Stand Sit to Stand: Min assist           General transfer comment: able to stand from EOB to RW to perform LB bathing tasks standing at bedside, CGA for balance     Balance Overall balance assessment: Needs assistance Sitting-balance support: Bilateral upper extremity supported, Feet supported Sitting balance-Leahy Scale: Fair     Standing balance support: Bilateral upper extremity supported, During functional activity, Reliant on assistive device for balance Standing balance-Leahy Scale: Fair Standing balance comment: unilateral support on RW for LB bathing                           ADL either performed or assessed with clinical judgement   ADL Overall ADL's : Needs assistance/impaired         Upper Body Bathing: Set up;Sitting   Lower Body Bathing: Moderate assistance;Sitting/lateral leans;Sit to/from stand   Upper Body Dressing : Minimal assistance;Sitting                          Extremity/Trunk Assessment  Vision       Perception     Praxis     Communication Communication Communication: No apparent difficulties   Cognition Arousal: Alert Behavior During Therapy: WFL for tasks assessed/performed                                 Following commands: Intact Following commands impaired: Follows one step commands with increased time      Cueing   Cueing Techniques: Verbal cues, Tactile cues  Exercises      Shoulder Instructions       General Comments  tolerated session well on 2L 02    Pertinent Vitals/ Pain       Pain Assessment Pain Assessment: 0-10 Pain Score: 8  Pain Location: low back pain Pain Descriptors / Indicators: Discomfort Pain Intervention(s): Monitored during session, Limited activity within patient's tolerance, Repositioned, Patient requesting pain meds-RN notified  Home Living                                          Prior Functioning/Environment              Frequency  Min 2X/week        Progress Toward Goals  OT Goals(current goals can now be found in the care plan section)  Progress towards OT goals: Progressing toward goals  Acute Rehab OT Goals Patient Stated Goal: improve function and strength OT Goal Formulation: With patient Time For Goal Achievement: 09/30/24 Potential to Achieve Goals: Good  Plan      Co-evaluation                 AM-PAC OT 6 Clicks Daily Activity     Outcome Measure   Help from another person eating meals?: None Help from another person taking care of personal grooming?: A Little Help from another person toileting, which includes using toliet, bedpan, or urinal?: A Lot Help from another person bathing (including washing, rinsing, drying)?: A Little Help from another person to put on and taking off regular upper body clothing?: A Little Help from another person to put on and taking off regular lower body clothing?: A Lot 6 Click Score: 17    End of Session Equipment Utilized During Treatment: Rolling walker (2 wheels);Oxygen  OT Visit Diagnosis: Other abnormalities of gait and mobility (R26.89);Muscle weakness (generalized) (M62.81)   Activity Tolerance Patient tolerated treatment well   Patient Left with call bell/phone within reach;in bed;with bed alarm set   Nurse Communication Mobility status        Time: 9189-9160 OT Time Calculation (min): 29 min  Charges: OT General Charges $OT Visit: 1 Visit OT Treatments $Self  Care/Home Management : 23-37 mins  Jadin Kagel, OTR/L  09/22/24, 1:30 PM   Jakarius Flamenco E Tondra Reierson 09/22/2024, 1:27 PM

## 2024-09-23 LAB — CBC
HCT: 27.5 % — ABNORMAL LOW (ref 36.0–46.0)
Hemoglobin: 8.7 g/dL — ABNORMAL LOW (ref 12.0–15.0)
MCH: 29.7 pg (ref 26.0–34.0)
MCHC: 31.6 g/dL (ref 30.0–36.0)
MCV: 93.9 fL (ref 80.0–100.0)
Platelets: 284 K/uL (ref 150–400)
RBC: 2.93 MIL/uL — ABNORMAL LOW (ref 3.87–5.11)
RDW: 15.4 % (ref 11.5–15.5)
WBC: 13.7 K/uL — ABNORMAL HIGH (ref 4.0–10.5)
nRBC: 0 % (ref 0.0–0.2)

## 2024-09-23 LAB — RENAL FUNCTION PANEL
Albumin: 3.1 g/dL — ABNORMAL LOW (ref 3.5–5.0)
Anion gap: 19 — ABNORMAL HIGH (ref 5–15)
BUN: 82 mg/dL — ABNORMAL HIGH (ref 8–23)
CO2: 21 mmol/L — ABNORMAL LOW (ref 22–32)
Calcium: 9.2 mg/dL (ref 8.9–10.3)
Chloride: 90 mmol/L — ABNORMAL LOW (ref 98–111)
Creatinine, Ser: 8.33 mg/dL — ABNORMAL HIGH (ref 0.44–1.00)
GFR, Estimated: 5 mL/min — ABNORMAL LOW (ref >60)
Glucose, Bld: 116 mg/dL — ABNORMAL HIGH (ref 70–99)
Phosphorus: 9 mg/dL — ABNORMAL HIGH (ref 2.5–4.6)
Potassium: 4.9 mmol/L (ref 3.5–5.1)
Sodium: 130 mmol/L — ABNORMAL LOW (ref 135–145)

## 2024-09-23 LAB — GLUCOSE, CAPILLARY
Glucose-Capillary: 116 mg/dL — ABNORMAL HIGH (ref 70–99)
Glucose-Capillary: 116 mg/dL — ABNORMAL HIGH (ref 70–99)
Glucose-Capillary: 101 mg/dL — ABNORMAL HIGH (ref 70–99)

## 2024-09-23 MED ORDER — SEVELAMER CARBONATE 800 MG PO TABS
1600.0000 mg | ORAL_TABLET | Freq: Three times a day (TID) | ORAL | Status: DC
  Administered 2024-09-23 – 2024-09-24 (×2): 1600 mg via ORAL
  Filled 2024-09-23 (×2): qty 2

## 2024-09-23 MED ORDER — ALTEPLASE 2 MG IJ SOLR: 2.0000 mg | Freq: Once | INTRAMUSCULAR | Status: DC | PRN

## 2024-09-23 MED ORDER — EPOETIN ALFA-EPBX 4000 UNIT/ML IJ SOLN
4000.0000 [IU] | INTRAMUSCULAR | Status: DC
  Administered 2024-09-23: 4000 [IU] via INTRAVENOUS

## 2024-09-23 MED ORDER — EPOETIN ALFA-EPBX 4000 UNIT/ML IJ SOLN
INTRAMUSCULAR | Status: AC
  Filled 2024-09-23: qty 1

## 2024-09-23 MED ORDER — HEPARIN SODIUM (PORCINE) 1000 UNIT/ML IJ SOLN
INTRAMUSCULAR | Status: AC
  Filled 2024-09-23: qty 4

## 2024-09-23 MED ORDER — MIDODRINE HCL 5 MG PO TABS
ORAL_TABLET | ORAL | Status: AC
  Filled 2024-09-23: qty 1

## 2024-09-23 MED ORDER — HEPARIN SODIUM (PORCINE) 1000 UNIT/ML DIALYSIS
1000.0000 [IU] | INTRAMUSCULAR | Status: DC | PRN
  Administered 2024-09-23: 3200 [IU]

## 2024-09-23 NOTE — Plan of Care (Signed)
  Problem: Education: Goal: Knowledge of General Education information will improve Description: Including pain rating scale, medication(s)/side effects and non-pharmacologic comfort measures Outcome: Progressing   Problem: Health Behavior/Discharge Planning: Goal: Ability to manage health-related needs will improve Outcome: Progressing   Problem: Clinical Measurements: Goal: Ability to maintain clinical measurements within normal limits will improve Outcome: Progressing Goal: Will remain free from infection Outcome: Progressing Goal: Diagnostic test results will improve Outcome: Progressing Goal: Respiratory complications will improve Outcome: Progressing Goal: Cardiovascular complication will be avoided Outcome: Progressing   Problem: Activity: Goal: Risk for activity intolerance will decrease Outcome: Progressing   Problem: Nutrition: Goal: Adequate nutrition will be maintained Outcome: Progressing   Problem: Coping: Goal: Level of anxiety will decrease Outcome: Progressing   Problem: Elimination: Goal: Will not experience complications related to bowel motility Outcome: Progressing Goal: Will not experience complications related to urinary retention Outcome: Progressing   Problem: Pain Managment: Goal: General experience of comfort will improve and/or be controlled Outcome: Progressing   Problem: Safety: Goal: Ability to remain free from injury will improve Outcome: Progressing   Problem: Skin Integrity: Goal: Risk for impaired skin integrity will decrease Outcome: Progressing   Problem: Education: Goal: Ability to describe self-care measures that may prevent or decrease complications (Diabetes Survival Skills Education) will improve Outcome: Progressing   Problem: Coping: Goal: Ability to adjust to condition or change in health will improve Outcome: Progressing   Problem: Fluid Volume: Goal: Ability to maintain a balanced intake and output will  improve Outcome: Progressing   Problem: Health Behavior/Discharge Planning: Goal: Ability to identify and utilize available resources and services will improve Outcome: Progressing Goal: Ability to manage health-related needs will improve Outcome: Progressing   Problem: Metabolic: Goal: Ability to maintain appropriate glucose levels will improve Outcome: Progressing   Problem: Nutritional: Goal: Maintenance of adequate nutrition will improve Outcome: Progressing Goal: Progress toward achieving an optimal weight will improve Outcome: Progressing   Problem: Skin Integrity: Goal: Risk for impaired skin integrity will decrease Outcome: Progressing   Problem: Tissue Perfusion: Goal: Adequacy of tissue perfusion will improve Outcome: Progressing   Problem: Fluid Volume: Goal: Hemodynamic stability will improve Outcome: Progressing   Problem: Clinical Measurements: Goal: Diagnostic test results will improve Outcome: Progressing Goal: Signs and symptoms of infection will decrease Outcome: Progressing   Problem: Respiratory: Goal: Ability to maintain adequate ventilation will improve Outcome: Progressing

## 2024-09-23 NOTE — TOC Progression Note (Signed)
 Transition of Care [facility]) - Progression Note    Patient Details  Name: Brianna [name] MRN: [id number] Date of Birth: 1961/12/05  [facility] CM/SW Contact  Corean ONEIDA Haddock, RN Phone Number: 09/23/2024, 3:47 PM  Clinical Narrative:     Auth approved through [facility]/25 for [facility] confirms they will have CP[facility] tomorrow for patient Patient will require [facility] for transport  Expected Discharge Plan: Skilled Nursing Facility Barriers to Discharge: Continued Medical Work up               Expected Discharge Plan and Services     Post Acute Care Choice: Skilled Nursing Facility Living arrangements for the past 2 months: Single Family Home                                       Social Drivers of Health (SDOH) Interventions SDOH Screenings   Food Insecurity: No Food Insecurity (09/18/2024)  Housing: Low Risk  (09/18/2024)  Transportation Needs: No Transportation Needs (09/18/2024)  Utilities: Not At Risk (09/18/2024)  Depression (PHQ2-9): Low Risk  (09/08/2024)  Financial Resource Strain: High Risk (06/04/2024)   Received from [facility]  Social Connections: Unknown (09/18/2024)  Tobacco Use: Low Risk  (09/12/2024)    Readmission Risk Interventions     No data to display

## 2024-09-23 NOTE — Progress Notes (Signed)
 [facility]  ROUNDING NOTE   Subjective:   Patient seen and evaluated during dialysis    HEMODIALYSIS FLOWSHEET:  Blood Flow Rate (mL/min): 399 mL/min Arterial Pressure (mmHg): -212.11 mmHg Venous Pressure (mmHg): 225.64 mmHg TMP (mmHg): 5.05 mmHg Ultrafiltration Rate (mL/min): 638 mL/min Dialysate Flow Rate (mL/min): 300 ml/min  Tolerating treatment fair   Objective:  Vital signs in last 24 hours:  Temp:  [97.3 F (36.3 C)-98.4 F (36.9 C)] 97.6 F (36.4 C) (09/24 0850) Pulse Rate:  [74-85] 84 (09/24 1100) Resp:  [13-20] 15 (09/24 1100) BP: (90-123)/(54-91) 94/58 (09/24 1100) SpO2:  [98 %-100 %] 98 % (09/24 1100) Weight:  [78.1 kg-78.3 kg] 78.1 kg (09/24 0850)  Weight change: 1 kg Filed Weights   09/22/24 0500 09/23/24 0248 09/23/24 0850  Weight: 78.3 kg 78.3 kg 78.1 kg    Intake/Output: I/O last 3 completed shifts: In: 200 [P.O.:200] Out: -    Intake/Output this shift:  No intake/output data recorded.  Physical Exam: General: NAD  Head: Normocephalic  Eyes: Anicteric  Lungs:  Clear, room air  Heart: Regular rate  Abdomen:  Soft  Extremities:  No peripheral edema.  Neurologic: Alert, oriented  Skin: No rashes, abd surgical incision   Access: Rt chest permcath, Left AVF maturing    Basic Metabolic Panel: Recent Labs  Lab 09/18/24 0610 09/19/24 0730 09/20/24 0540 09/21/24 0436 09/23/24 0854  NA 140 137 137 137 130*  K 4.8 4.5 4.6 4.7 4.9  CL 96* 95* 92* 91* 90*  CO2 22 23 21* 23 21*  GLUCOSE 84 79 103* 94 116*  BUN 97* 72* 91* 76* 82*  CREATININE 8.56* 7.22* 8.56* 9.97* 8.33*  CALCIUM 9.3 9.4 9.6 9.4 9.2  MG  --  2.1 2.0 2.1  --   PHOS 8.2* 8.2* 8.8* 10.0* 9.0*    Liver Function Tests: Recent Labs  Lab 09/17/24 0455 09/18/24 0610 09/23/24 0854  ALBUMIN  3.4* 3.1* 3.1*   No results for input(s): LIPASE, AMYLASE in the last 168 hours. No results for input(s): AMMONIA in the last 168 hours.  CBC: Recent Labs   Lab 09/17/24 0455 09/18/24 0610 09/21/24 0840 09/23/24 0854  WBC 9.1 10.4 12.0* 13.7*  HGB 8.9* 8.9* 8.6* 8.7*  HCT 28.3* 27.6* 27.6* 27.5*  MCV 94.6 93.9 94.5 93.9  PLT 211 196 259 284    Cardiac Enzymes: No results for input(s): CKTOTAL, CKMB, CKMBINDEX, TROPONINI in the last 168 hours.  BNP: Invalid input(s): POCBNP  CBG: Recent Labs  Lab 09/22/24 0442 09/22/24 0849 09/22/24 1156 09/22/24 1718 09/22/24 2200  GLUCAP 125* 90 111* 111* 104*    Microbiology: Results for orders placed or performed during the hospital encounter of 09/12/24  Culture, blood (x 2)     Status: None   Collection Time: 09/15/24  3:11 AM   Specimen: BLOOD RIGHT HAND  Result Value Ref Range Status   Specimen Description BLOOD RIGHT HAND  Final   Special Requests   Final    BOTTLES DRAWN AEROBIC ONLY Blood Culture adequate volume   Culture   Final    NO GROWTH 5 DAYS Performed at [facility], [address], [place], [place] [address]    Report Status 09/20/2024 FINAL  Final  Culture, blood (x 2)     Status: None   Collection Time: 09/15/24  3:11 AM   Specimen: BLOOD RIGHT ARM  Result Value Ref Range Status   Specimen Description BLOOD RIGHT ARM  Final   Special Requests  Final    BOTTLES DRAWN AEROBIC ONLY Blood Culture adequate volume   Culture   Final    NO GROWTH 5 DAYS Performed at [facility], [address] [place] [place], [place] [address]    Report Status 09/20/2024 FINAL  Final  MRSA Next Gen by PCR, Nasal     Status: None   Collection Time: 09/15/24  4:16 AM   Specimen: Nasal Mucosa; Nasal Swab  Result Value Ref Range Status   MRSA by PCR Next Gen NOT DETECTED NOT DETECTED Final    Comment: (NOTE) The GeneXpert MRSA Assay (FDA approved for NASAL specimens only), is one component of a comprehensive MRSA colonization surveillance program. It is not intended to diagnose MRSA infection nor to guide or monitor treatment for MRSA infections. Test  performance is not FDA approved in patients less than 37 years old. Performed at [facility], [address] [place], [place] [address]     Coagulation Studies: No results for input(s): LABPROT, INR in the last 72 hours.  Urinalysis: No results for input(s): COLORURINE, LABSPEC, PHURINE, GLUCOSEU, HGBUR, BILIRUBINUR, KETONESUR, PROTEINUR, UROBILINOGEN, NITRITE, LEUKOCYTESUR in the last 72 hours.  Invalid input(s): APPERANCEUR    Imaging: No results found.   Medications:     acetaminophen   1,000 mg Oral Q8H   allopurinol   100 mg Oral Daily   Chlorhexidine  Gluconate Cloth  6 each Topical Q0600   ezetimibe   10 mg Oral Daily   feeding supplement (NEPRO CARB STEADY)  237 mL Oral BID BM   heparin   5,000 Units Subcutaneous Q8H   hydrOXYzine   50 mg Oral Daily   insulin  aspart  0-9 Units Subcutaneous TID WC & HS   midodrine   5 mg Oral TID WC   multivitamin  1 tablet Oral QHS   pantoprazole   40 mg Oral Daily   sertraline   25 mg Oral Daily   thiamine   100 mg Oral Daily   albuterol , ALPRAZolam , alteplase , heparin , HYDROmorphone  (DILAUDID ) injection, methocarbamol  (ROBAXIN ) injection, ondansetron  (ZOFRAN ) IV, ondansetron , oxyCODONE , oxyCODONE , zolpidem   Assessment/ Plan:  [name] is a 63 y.o.  female  with past medical conditions including obstructive sleep apnea on CPAP, gout, GERD, type 2 diabetes, and end-stage renal disease on hemodialysis.  Patient presented to the hospital due to incarcerated and obstructed ventral hernia, experienced exploratory laparotomy on 9/13.  Patient is currently admitted for SBO (small bowel obstruction) (HCC) [K56.609] Hernia of abdominal wall [K43.9] Incarcerated ventral hernia [K43.6]   [facility]/MWF/right IJ PermCath/left upper AVF.    End-stage renal disease on hemodialysis.  Continue MWF schedule. Receiving dialysis today, UF reduced to 1L due to hypotension. Next treatment  scheduled for Friday.    2. Acute respiratory failure, placed on Bipap in ICU.   Weaned to room air   3. Anemia of chronic kidney disease Hemoglobin & Hematocrit     Component Value Date/Time   HGB 8.7 (L) 09/23/2024 0854   HGB 14.6 04/22/2013 1549   HCT 27.5 (L) 09/23/2024 0854   HCT 44.0 04/22/2013 1549    Hgb decreased, continue low-dose EPO with dialysis   4.  Incarcerated ventral hernia status post exploratory laparotomy on 9/13.  General surgery continues to follow and offer supportive measures and pain management. Surgery has removed NGT and wound vac. Diet advanced to solid foods, tolerating small meals.    5. Secondary Hyperparathyroidism: with outpatient labs: PTH 993, phosphorus 4.9, calcium 9.5 on 09/07/24.   Sevelamer  has been held due to  GI concerns. Phos 9.0. Correcting some with dialysis. Encourage low phos diet     LOS: 10 Brianna [name] 9/24/202511:32 AM

## 2024-09-23 NOTE — Progress Notes (Signed)
   09/21/24 0004  BiPAP/CPAP/SIPAP  $ Non-Invasive Ventilator  Non-Invasive Vent Subsequent  BiPAP/CPAP/SIPAP Pt Type Adult  BiPAP/CPAP/SIPAP Resmed  Mask Type Full face mask  Dentures removed? Not applicable  Mask Size Medium  EPAP 6 cmH2O  Flow Rate 2 lpm  Patient Home Machine No  Patient Home Mask No  Patient Home Tubing No  Auto Titrate No  Device Plugged into RED Power Outlet Yes  BiPAP/CPAP /SiPAP Vitals  Pulse Rate 84  SpO2 100 %  MEWS Score/Color  MEWS Score 0  MEWS Score Color [place]

## 2024-09-23 NOTE — Progress Notes (Signed)
 [facility] SURGICAL PROGRESS NOTE  Hospital Day(s): 10.   Post op day(s): 11 Days Post-Op.   Interval History:  Patient seen and examined No acute issues overnight She reports she is doing very well No significant pain No nausea or emesis Interestingly her WBC is climbing; 13.7K Hgb to 8.7; stable BMP consistent with ESRD She has continued to have bowel function Awaiting placement   Vital signs in last 24 hours: [min-max] current  Temp:  [97.3 F (36.3 C)-98.4 F (36.9 C)] 97.6 F (36.4 C) (09/24 0850) Pulse Rate:  [74-85] 78 (09/24 0930) Resp:  [13-20] 16 (09/24 0930) BP: (102-123)/(60-91) 102/60 (09/24 0930) SpO2:  [100 %] 100 % (09/24 0930) Weight:  [78.1 kg-78.3 kg] 78.1 kg (09/24 0850)     Height: 4' 9 (144.8 cm) Weight: 78.1 kg BMI (Calculated): 37.25   Intake/Output last 2 shifts:  09/23 0701 - 09/24 0700 In: 200 [P.O.:200] Out: -    Physical Exam:  Constitutional: alert, cooperative and no distress  Respiratory: She is on [place], no respiratory distress Cardiovascular: normal rate and sinus rhythm  Gastrointestinal: soft, non-tender, and non-distended, no rebound/guarding.  Integumentary: Midline intact with staples, no erythema   Labs:     Latest Ref Rng & Units 09/23/2024    8:54 AM 09/21/2024    8:40 AM 09/18/2024    6:10 AM  CBC  WBC 4.0 - 10.5 K/uL 13.7  12.0  10.4   Hemoglobin 12.0 - 15.0 g/dL 8.7  8.6  8.9   Hematocrit 36.0 - 46.0 % 27.5  27.6  27.6   Platelets 150 - 400 K/uL 284  259  196       Latest Ref Rng & Units 09/23/2024    8:54 AM 09/21/2024    4:36 AM 09/20/2024    5:40 AM  CMP  Glucose 70 - 99 mg/dL 883  94  896   BUN 8 - 23 mg/dL 82  76  91   Creatinine 0.44 - 1.00 mg/dL 1.66  0.02  1.43   Sodium 135 - 145 mmol/L 130  137  137   Potassium 3.5 - 5.1 mmol/L 4.9  4.7  4.6   Chloride 98 - 111 mmol/L 90  91  92   CO2 22 - 32 mmol/L 21  23  21    Calcium 8.9 - 10.3 mg/dL 9.2  9.4  9.6     Imaging studies:  No new  imaging studies    Assessment/Plan:  63 y.o. female 11 Days Post-Op s/p exploratory laparotomy, reduction of incarcerated ventral hernia, and placement of Prevena wound vac complicated by pertinent comorbidities including ESRD.    - Okay to continue renal diet   - Monitor abdominal examination; on-going bowel function   - Pain control prn; Antiemetics prn             - Okay to mobilize; continue abdominal binder              - Appreciate nephrology assistance with ESRD, HD  - Appreciate medicine assistance   - Discharge Planning: She is stable for DC. She will need CPAP at SNF and this will be delivered tomorrow. We will plan for DC tomorrow (09/25).   All of the above findings and recommendations were discussed with the patient, and the medical team, and all of patient's questions were answered to her expressed satisfaction.  -- [name], PA-C [facility] 09/23/2024, 10:04 AM M-F: 7am - 4pm

## 2024-09-24 ENCOUNTER — Ambulatory Visit (INDEPENDENT_AMBULATORY_CARE_PROVIDER_SITE_OTHER): Admitting: Vascular Surgery

## 2024-09-24 ENCOUNTER — Encounter (INDEPENDENT_AMBULATORY_CARE_PROVIDER_SITE_OTHER)

## 2024-09-24 ENCOUNTER — Encounter (INDEPENDENT_AMBULATORY_CARE_PROVIDER_SITE_OTHER): Payer: Self-pay

## 2024-09-24 LAB — GLUCOSE, CAPILLARY
Glucose-Capillary: 103 mg/dL — ABNORMAL HIGH (ref 70–99)
Glucose-Capillary: 130 mg/dL — ABNORMAL HIGH (ref 70–99)

## 2024-09-24 MED ORDER — MENTHOL 3 MG MT LOZG: 1.0000 | LOZENGE | OROMUCOSAL | Status: DC | PRN

## 2024-09-24 MED ORDER — OXYCODONE HCL 10 MG PO TABS: 10.0000 mg | ORAL_TABLET | ORAL | 0 refills | Status: DC | PRN

## 2024-09-24 NOTE — Discharge Instructions (Signed)
 In addition to included general post-operative instructions ,  Diet: Resume home diet.   Activity: No heavy lifting >20 pounds (children, pets, laundry, garbage) or strenuous activity for 6 weeks from date of surgery (09/14), but light activity and walking are encouraged. Do not drive or drink alcohol if taking narcotic pain medications or having pain that might distract from driving. Continue to work with therapies. Continue abdominal binder.   Wound care: You may shower/get incision wet with soapy water and pat dry (do not rub incisions), but no baths or submerging incision underwater until follow-up. Will remove staples in follow up.   Medications: Resume all home medications. For mild to moderate pain: acetaminophen  (Tylenol ) or ibuprofen/naproxen (if no kidney disease). Combining Tylenol  with alcohol can substantially increase your risk of causing liver disease. Narcotic pain medications, if prescribed, can be used for severe pain, though may cause nausea, constipation, and drowsiness. Do not combine Tylenol  and Percocet (or similar) within a 6 hour period as Percocet (and similar) contain(s) Tylenol . If you do not need the narcotic pain medication, you do not need to fill the prescription.  Call office [phone] / [phone]) at any time if any questions, worsening pain, fevers/chills, bleeding, drainage from incision site, or other concerns.

## 2024-09-24 NOTE — Progress Notes (Addendum)
 Pt receives outpt HD at  [facility] MWF with chair time 6:15am. Navigator following to assist with any HD needs.  Suzen Satchel Dialysis Navigator [phone]

## 2024-09-24 NOTE — Discharge Summary (Signed)
 [facility] SURGICAL DISCHARGE SUMMARY  Patient ID: Brianna [name] MRN: [id number] DOB/AGE: 06-Nov-1961 63 y.o.  Admit date: 09/12/2024 Discharge date: 09/24/2024  Discharge Diagnoses Patient Active Problem List   Diagnosis Date Noted   Shock circulatory (HCC) 09/15/2024   SBO (small bowel obstruction) (HCC) 09/15/2024   Incarcerated ventral hernia 09/13/2024   Hernia of abdominal wall 09/13/2024   Strangulated ventral hernia 02/09/2020    Consultants Medicine PCCM Nephrology  Procedures 09/13/2024 Exploratory laparotomy, reduction of incarcerated and obstructed hernia, repair of ventral hernias measuring approximately 15 cm, lysis of adhesions for 2 hours, omentectomy   HPI: Brianna [name] is a 63 y.o. female with past medical history of heart failure, end-stage renal disease on dialysis with last dialysis session on September 12 with a port, history of exploratory laparotomy with small bowel resection and hernia repair 4 years ago who presents in consultation for abdominal pain.  The patient reports that yesterday she noticed a large abdominal bulge.  This was associated with nausea and vomiting.  She is also obstipated.  Her last bowel movement was yesterday.  She denies any overlying skin changes to the hernia.  She has been trying to reduce it herself at home but was unable to do this.  She reports that prior to this she did not notice any bulges in her abdomen.[facility]   Hospital Course:  Informed consent was obtained and documented, and patient underwent exploratory laparotomy, reduction of incarcerated and obstructed hernia, repair of ventral hernias measuring approximately 15 cm, lysis of adhesions for 2 hours, omentectomy (Dr Marinda, 09/13/2024).  Post-operatively, patient had initially done well with reported return of bowel function. NGT was removed by the patient on POD1. Unfortunately, she would develop an ileus and needed this replaced. She also developed  significant tachypnea and tachycardia on POD1. She was worked up extensively and this proved reassuring. She did need transferred to step down status for a brief time but never required vasopressor support. Fortunately, she responded well and progressed back to floor status. PCCM, medicine, and nephrology assisted in her care. Ultimately, she was able to progress to NGT removal and diet was advanced in a step wise fashion without issue. She had adequate return of bowel function. She ambulated with therapies and SNF was recommended. The remainder of patient's hospital course was essentially unremarkable, and discharge planning was initiated accordingly with patient safely able to be discharged home with appropriate discharge instructions, pain control, and outpatient follow-up after all of her questions were answered to her expressed satisfaction.   Discharge Condition: Good   Physical Examination:  Constitutional: alert, cooperative and no distress  Respiratory: She is on [place], no respiratory distress Cardiovascular: normal rate and sinus rhythm  Gastrointestinal: soft, non-tender, and non-distended, no rebound/guarding.  Integumentary: Midline intact with staples, no erythema      Allergies as of 09/24/2024       Reactions   Ace Inhibitors Anaphylaxis   Lisinopril Anaphylaxis   Lipitor [atorvastatin] Other (See Comments)   Muscle aches        Medication List     STOP taking these medications    HYDROcodone -acetaminophen  5-325 MG tablet Commonly known as: NORCO/VICODIN       TAKE these medications    albuterol  108 (90 Base) MCG/ACT inhaler Commonly known as: VENTOLIN  HFA Inhale 1-2 puffs into the lungs every 6 (six) hours as needed for wheezing or shortness of breath.   albuterol  (2.5 MG/3ML) 0.083% nebulizer solution Commonly known as: PROVENTIL  Take  2.5 mg by nebulization every 4 (four) hours as needed for shortness of breath or wheezing.   allopurinol  300 MG  tablet Commonly known as: ZYLOPRIM  Take 300 mg by mouth every morning.   amLODipine  5 MG tablet Commonly known as: NORVASC  Take 5 mg by mouth daily.   Aspirin  Adult Low Strength 81 MG tablet Generic drug: aspirin  EC Take 81 mg by mouth daily.   carvedilol  12.5 MG tablet Commonly known as: COREG  Take 1 tablet (12.5 mg total) by mouth 2 (two) times daily before a meal.   cetirizine 10 MG tablet Commonly known as: ZYRTEC Take 10 mg by mouth every morning.   cyanocobalamin  1000 MCG tablet Commonly known as: VITAMIN B12 Take 1 tablet (1,000 mcg total) by mouth daily.   FeroSul 325 (65 Fe) MG tablet Generic drug: ferrous sulfate Take 325 mg by mouth every other day.   furosemide  80 MG tablet Commonly known as: LASIX  Take 1 tablet (80 mg total) by mouth daily. For leg swelling or weight gain >5lbs What changed: when to take this   lactulose  10 GM/15ML solution Commonly known as: CHRONULAC  Take 10 g by mouth daily as needed for mild constipation.   omeprazole 20 MG capsule Commonly known as: PRILOSEC Take 20 mg by mouth every morning.   Oxycodone  HCl 10 MG Tabs Take 1 tablet (10 mg total) by mouth every 4 (four) hours as needed for severe pain (pain score 7-10).   Ozempic (1 MG/DOSE) 4 MG/3ML Sopn Generic drug: Semaglutide (1 MG/DOSE) Inject 1 mg into the skin once a week.   sertraline  25 MG tablet Commonly known as: ZOLOFT  Take 25 mg by mouth daily.   sevelamer  carbonate 800 MG tablet Commonly known as: RENVELA  Take 1,600 mg by mouth 3 (three) times daily.   triamcinolone ointment 0.5 % Commonly known as: KENALOG Apply 1 Application topically 2 (two) times daily as needed.   Vitamin D  (Ergocalciferol ) 1.25 MG (50000 UNIT) Caps capsule Commonly known as: DRISDOL Take 50,000 Units by mouth once a week.   Zetia  10 MG tablet Generic drug: ezetimibe  Take 10 mg by mouth every morning.   zolpidem  5 MG tablet Commonly known as: AMBIEN  Take 1 tablet (5 mg total)  by mouth at bedtime as needed for sleep.          Contact information for follow-up providers     Marinda Jayson KIDD, MD. Schedule an appointment as soon as possible for a visit in 1 week(s).   Specialty: General Surgery Why: Follow up in 1 weeks; s/p ex lap, has staples Contact information: [address] [place] [address] [phone]              Contact information for after-discharge care     Destination     [facility]   Service: Skilled Nursing Contact information: [address] [place]  [address] [phone]                      Time spent on discharge management including discussion of hospital course, clinical condition, outpatient instructions, prescriptions, and follow up with the patient and members of the medical team: >30 minutes  -- [name] , PA-C  [facility]  09/24/2024, 11:37 AM [phone] M-F: 7am - 4pm

## 2024-09-24 NOTE — Progress Notes (Signed)
 Report called to [facility], RN at [facility].

## 2024-09-24 NOTE — Progress Notes (Signed)
 [facility]  ROUNDING NOTE   Subjective:   Patient seen sitting at side of bed Alert and oriented Denies pain Room air   Objective:  Vital signs in last 24 hours:  Temp:  [97.3 F (36.3 C)-98.7 F (37.1 C)] 98 F (36.7 C) (09/25 0831) Pulse Rate:  [80-95] 84 (09/25 0831) Resp:  [14-20] 18 (09/25 0831) BP: (98-130)/(49-66) 122/55 (09/25 0831) SpO2:  [98 %-100 %] 100 % (09/25 0831) Weight:  [77.7 kg-77.8 kg] 77.8 kg (09/25 0453)  Weight change: -0.2 kg Filed Weights   09/23/24 0850 09/23/24 1245 09/24/24 0453  Weight: 78.1 kg 77.7 kg 77.8 kg    Intake/Output: I/O last 3 completed shifts: In: 0  Out: 500 [Other:500]   Intake/Output this shift:  Total I/O In: 240 [P.O.:240] Out: -   Physical Exam: General: NAD  Head: Normocephalic  Eyes: Anicteric  Lungs:  Clear, room air  Heart: Regular rate  Abdomen:  Soft  Extremities:  No peripheral edema.  Neurologic: Alert, oriented  Skin: No rashes, abd surgical incision   Access: Rt chest permcath, Left AVF maturing    Basic Metabolic Panel: Recent Labs  Lab 09/18/24 0610 09/19/24 0730 09/20/24 0540 09/21/24 0436 09/23/24 0854  NA 140 137 137 137 130*  K 4.8 4.5 4.6 4.7 4.9  CL 96* 95* 92* 91* 90*  CO2 22 23 21* 23 21*  GLUCOSE 84 79 103* 94 116*  BUN 97* 72* 91* 76* 82*  CREATININE 8.56* 7.22* 8.56* 9.97* 8.33*  CALCIUM 9.3 9.4 9.6 9.4 9.2  MG  --  2.1 2.0 2.1  --   PHOS 8.2* 8.2* 8.8* 10.0* 9.0*    Liver Function Tests: Recent Labs  Lab 09/18/24 0610 09/23/24 0854  ALBUMIN  3.1* 3.1*   No results for input(s): LIPASE, AMYLASE in the last 168 hours. No results for input(s): AMMONIA in the last 168 hours.  CBC: Recent Labs  Lab 09/18/24 0610 09/21/24 0840 09/23/24 0854  WBC 10.4 12.0* 13.7*  HGB 8.9* 8.6* 8.7*  HCT 27.6* 27.6* 27.5*  MCV 93.9 94.5 93.9  PLT 196 259 284    Cardiac Enzymes: No results for input(s): CKTOTAL, CKMB, CKMBINDEX, TROPONINI in the  last 168 hours.  BNP: Invalid input(s): POCBNP  CBG: Recent Labs  Lab 09/23/24 1406 09/23/24 1624 09/23/24 2131 09/24/24 0834 09/24/24 1139  GLUCAP 116* 116* 101* 103* 130*    Microbiology: Results for orders placed or performed during the hospital encounter of 09/12/24  Culture, blood (x 2)     Status: None   Collection Time: 09/15/24  3:11 AM   Specimen: BLOOD RIGHT HAND  Result Value Ref Range Status   Specimen Description BLOOD RIGHT HAND  Final   Special Requests   Final    BOTTLES DRAWN AEROBIC ONLY Blood Culture adequate volume   Culture   Final    NO GROWTH 5 DAYS Performed at [facility], [address] [place] [address], [place], [place] [address]    Report Status 09/20/2024 FINAL  Final  Culture, blood (x 2)     Status: None   Collection Time: 09/15/24  3:11 AM   Specimen: BLOOD RIGHT ARM  Result Value Ref Range Status   Specimen Description BLOOD RIGHT ARM  Final   Special Requests   Final    BOTTLES DRAWN AEROBIC ONLY Blood Culture adequate volume   Culture   Final    NO GROWTH 5 DAYS Performed at [facility], [address] [place], [place] [address]  Report Status 09/20/2024 FINAL  Final  MRSA Next Gen by PCR, Nasal     Status: None   Collection Time: 09/15/24  4:16 AM   Specimen: Nasal Mucosa; Nasal Swab  Result Value Ref Range Status   MRSA by PCR Next Gen NOT DETECTED NOT DETECTED Final    Comment: (NOTE) The GeneXpert MRSA Assay (FDA approved for NASAL specimens only), is one component of a comprehensive MRSA colonization surveillance program. It is not intended to diagnose MRSA infection nor to guide or monitor treatment for MRSA infections. Test performance is not FDA approved in patients less than 28 years old. Performed at [facility], [address] [place], [place] [address]     Coagulation Studies: No results for input(s): LABPROT, INR in the last 72 hours.  Urinalysis: No results for input(s):  COLORURINE, LABSPEC, PHURINE, GLUCOSEU, HGBUR, BILIRUBINUR, KETONESUR, PROTEINUR, UROBILINOGEN, NITRITE, LEUKOCYTESUR in the last 72 hours.  Invalid input(s): APPERANCEUR    Imaging: No results found.   Medications:     acetaminophen   1,000 mg Oral Q8H   allopurinol   100 mg Oral Daily   Chlorhexidine  Gluconate Cloth  6 each Topical Q0600   epoetin  alfa-epbx (RETACRIT ) injection  4,000 Units Intravenous Q M,W,F-1800   ezetimibe   10 mg Oral Daily   feeding supplement (NEPRO CARB STEADY)  237 mL Oral BID BM   heparin   5,000 Units Subcutaneous Q8H   hydrOXYzine   50 mg Oral Daily   insulin  aspart  0-9 Units Subcutaneous TID WC & HS   midodrine   5 mg Oral TID WC   multivitamin  1 tablet Oral QHS   pantoprazole   40 mg Oral Daily   sertraline   25 mg Oral Daily   sevelamer  carbonate  1,600 mg Oral TID   thiamine   100 mg Oral Daily   albuterol , ALPRAZolam , HYDROmorphone  (DILAUDID ) injection, menthol , methocarbamol  (ROBAXIN ) injection, ondansetron  (ZOFRAN ) IV, ondansetron , oxyCODONE , oxyCODONE , zolpidem   Assessment/ Plan:  Ms. Ardythe Klute is a 63 y.o.  female  with past medical conditions including obstructive sleep apnea on CPAP, gout, GERD, type 2 diabetes, and end-stage renal disease on hemodialysis.  Patient presented to the hospital due to incarcerated and obstructed ventral hernia, experienced exploratory laparotomy on 9/13.  Patient is currently admitted for SBO (small bowel obstruction) (HCC) [K56.609] Hernia of abdominal wall [K43.9] Incarcerated ventral hernia [K43.6]   [facility]/MWF/right IJ PermCath/left upper AVF.    End-stage renal disease on hemodialysis.  Continue MWF schedule. Received dialysis yesterday, achieved. Next treatment scheduled for Friday.    2. Acute respiratory failure, placed on Bipap in ICU.   Room air   3. Anemia of chronic kidney disease Hemoglobin & Hematocrit     Component Value Date/Time   HGB  8.7 (L) 09/23/2024 0854   HGB 14.6 04/22/2013 1549   HCT 27.5 (L) 09/23/2024 0854   HCT 44.0 04/22/2013 1549   Continue low-dose EPO with dialysis   4.  Incarcerated ventral hernia status post exploratory laparotomy on 9/13.  General surgery continues to follow and offer supportive measures and pain management. Surgery has removed NGT and wound vac.Tolerating solids meals.   5. Secondary Hyperparathyroidism: with outpatient labs: PTH 993, phosphorus 4.9, calcium 9.5 on 09/07/24.   Sevelamer  has been held due to GI concerns. Phos 9.0.Will continue to monitor.      LOS: 11 Khali Perella 9/25/202512:08 PM

## 2024-09-24 NOTE — TOC Transition Note (Incomplete)
 Transition of Care [facility]) - Discharge Note   Patient Details  Name: Brianna [name] MRN: [id number] Date of Birth: December 19, 1961  [facility] CM/SW Contact:  Corean ONEIDA Haddock, RN Phone Number: 09/24/2024, 11:57 AM   Clinical Narrative:    Tammy at [facility] confirms CPAP will be avilabile today  Patient will DC to: [facility]  Anticipated DC date: 09/24/24 Transport by: [place]  Per MD patient ready for DC to . RN, patient, patient's family, and facility notified of DC. Discharge Summary sent to facility. RN given number for report. DC packet on chart. Ambulance transport requested for patient.   TOC signing off.      Barriers to Discharge: Continued Medical Work up   Patient Goals and CMS Choice            Discharge Placement                       Discharge Plan and Services Additional resources added to the After Visit Summary for       Post Acute Care Choice: Skilled Nursing Facility                               Social Drivers of Health (SDOH) Interventions SDOH Screenings   Food Insecurity: No Food Insecurity (09/18/2024)  Housing: Low Risk  (09/18/2024)  Transportation Needs: No Transportation Needs (09/18/2024)  Utilities: Not At Risk (09/18/2024)  Depression (PHQ2-9): Low Risk  (09/08/2024)  Financial Resource Strain: High Risk (06/04/2024)   Received from [facility]  Social Connections: Unknown (09/18/2024)  Tobacco Use: Low Risk  (09/12/2024)     Readmission Risk Interventions     No data to display

## 2024-09-24 NOTE — Progress Notes (Signed)
 D/c orders noted. Contacted pt out-pt hd clinic, [facility] MWF @ 6:15am chair time, to inform of pt d/c. D/c summary has been faxed over. No further assistance needed at this time.    Suzen Satchel Dialysis Navigator [phone]

## 2024-09-24 NOTE — Progress Notes (Signed)
 Mobility Specialist - Progress Note   09/24/24 1044  Mobility  Activity Stood at bedside;Ambulated with assistance;Dangled on edge of bed  Level of Assistance Contact guard assist, steadying assist  Assistive Device Front wheel walker  Distance Ambulated (ft) 6 ft  Activity Response Tolerated well  Mobility visit 1 Mobility  Mobility Specialist Start Time (ACUTE ONLY) 1008  Mobility Specialist Stop Time (ACUTE ONLY) 1035  Mobility Specialist Time Calculation (min) (ACUTE ONLY) 27 min   Pt seated EOB upon entry, utilizing RA. Pt remained seated EOB while MS assisted with bath, peri care and donning paper scrub top. Pt STS to RW CGA, stood at bedside while MS dons clean undergarments and paper scrubs. Pt left in fowler position with alarm set and needs within reach.  America Silvan Mobility Specialist 09/24/24 10:56 AM

## 2024-09-29 ENCOUNTER — Ambulatory Visit (INDEPENDENT_AMBULATORY_CARE_PROVIDER_SITE_OTHER): Admitting: General Surgery

## 2024-09-29 ENCOUNTER — Encounter: Payer: Self-pay | Admitting: General Surgery

## 2024-09-29 VITALS — BP 97/63 | HR 72 | Ht <= 58 in | Wt 188.0 lb

## 2024-09-29 DIAGNOSIS — Z09 Encounter for follow-up examination after completed treatment for conditions other than malignant neoplasm: Secondary | ICD-10-CM

## 2024-09-29 DIAGNOSIS — K56609 Unspecified intestinal obstruction, unspecified as to partial versus complete obstruction: Secondary | ICD-10-CM

## 2024-09-29 DIAGNOSIS — K430 Incisional hernia with obstruction, without gangrene: Secondary | ICD-10-CM | POA: Diagnosis not present

## 2024-09-29 NOTE — Patient Instructions (Signed)
 Keep a clean dry gauze over the open part of your incision and change once a day until it fully closes up. You have steri strips in place over your wound these will start to come off in 1-2 weeks. You may shower as usual.   Follow-up with our office in one month.   Please call and ask to speak with a nurse if you develop questions or concerns.   Wound Closure Removal, Care After The following information offers guidance on how to care for yourself after your stitches (sutures), staples, or adhesive strips have been removed. Your health care provider may also give you more specific instructions. If you have problems or questions, contact your health care provider. What can I expect after the procedure? After your sutures or staples have been removed or your adhesive strips have fallen off, it is common to have: Some discomfort and swelling in the area. Slight redness in the area. Follow these instructions at home: If you have a dressing: Wash your hands with soap and water for at least 20 seconds before and after you change your bandage (dressing). If soap and water are not available, use hand sanitizer. Change your dressing as told by your health care provider. If your dressing becomes wet or dirty, or develops a bad smell, change it as soon as possible. If your dressing sticks to your skin, pour warm, clean water over it until it loosens and can be removed without pulling apart the wound edges. Pat the area dry with a soft, clean towel. Do not rub the wound because that may cause bleeding. Wound care  Check your wound every day for signs of infection. Check for: More redness, swelling, or pain. Fluid or blood. New warmth, a rash, or hardness at the wound site. Pus or a bad smell. Wash your hands with soap and water for at least 20 seconds before and after touching your wound. If soap and water are not available, use hand sanitizer. Keep the wound area dry and clean. Clean and pat the  wound dry as told by your health care provider. Apply cream or ointment only as told by your health care provider. If skin glue or adhesive strips were applied after sutures or staples were removed, leave these closures in place until they peel off on their own. If adhesive strip edges start to loosen and curl up, you may trim the loose edges. Do not remove adhesive strips completely unless your health care provider tells you to do that. Continue to protect the wound from injury. Do not pick at your wound. Picking can cause an infection. Bathing Do not take baths, swim, or use a hot tub until your health care provider approves. Ask your health care provider if you may take showers. Follow these steps for showering: If you have a dressing, remove it before getting into the shower. In the shower, allow soapy water to get on the wound. Avoid scrubbing the wound. When you get out of the shower, dry the wound by patting it with a clean towel. Reapply a dressing over the wound, if needed. Scar care When your wound has completely healed, help decrease the size of your scar by: Wearing sunscreen over the scar or covering it with clothing when you are outside. New scars get sunburned easily, which can make scarring worse. Gently massaging the scarred area. This can decrease scar thickness. General instructions Take over-the-counter and prescription medicines only as told by your health care provider. Keep all follow-up  visits. This is important. Contact a health care provider if: You have more redness, swelling, or pain around your wound. You have fluid or blood coming from your wound. You have new warmth, a rash, or hardness at the wound site. You have pus or a bad smell coming from your wound. Your wound opens up. Get help right away if: You have a fever or chills. You have red streaks coming from your wound. Summary Change your dressing as told by your health care provider. If your dressing  becomes wet or dirty, or develops a bad smell, change it as soon as possible. Check your wound every day for signs of infection. Wash your hands with soap and water for 20 seconds before and after touching your wound. This information is not intended to replace advice given to you by your health care provider. Make sure you discuss any questions you have with your health care provider. Document Revised: 04/11/2021 Document Reviewed: 04/11/2021 Elsevier Patient Education  2025 [employer].

## 2024-10-05 NOTE — Progress Notes (Signed)
 Outpatient Surgical Follow Up CC: S/P Repair of Obstructed Ventral Hernia   Brianna [name] is an 63 y.o. female.   Chief Complaint  Patient presents with   Routine Post Op    HPI: The patient returns today after undergoing exploratory laparotomy with repair of obstructed hernia with Vicryl bridging mesh.  She was discharged to a skilled nursing facility.  She reports doing well.  She is working with therapy.  She is undergoing dialysis without problems.  She is eating well and having normal bowel movements.  She did get labs at her facility that were notable for a white count of 17,000.  However, she denies any fevers or chills.  Past Medical History:  Diagnosis Date   Acid reflux    Anasarca    Anemia    Asthma    well controlled   Bilateral leg edema    Bronchopneumonia    Chronic heart failure with preserved ejection fraction (HFpEF) (HCC)    a. 12/2020 Echo: EF 70-75%, no rwma, mild LVH, GrI DD, nl RV fxn, triv MR/AI.   CKD (chronic kidney disease), stage IV (HCC)    DM (diabetes mellitus), type 2 (HCC)    Dyspnea    Hypertension    Hypokalemia    Morbid obesity (HCC)    Strangulated ventral hernia     Past Surgical History:  Procedure Laterality Date   A/V FISTULAGRAM Left 01/28/2024   Procedure: A/V Fistulagram;  Surgeon: Jama Cordella MATSU, MD;  Location: [facility];  Service: Cardiovascular;  Laterality: Left;   A/V FISTULAGRAM Left 04/14/2024   Procedure: A/V Fistulagram;  Surgeon: Jama Cordella MATSU, MD;  Location: [facility];  Service: Cardiovascular;  Laterality: Left;   AV FISTULA PLACEMENT Left 08/08/2023   Procedure: ARTERIOVENOUS (AV) FISTULA CREATION (RADIALCEPHALIC);  Surgeon: Marea Selinda RAMAN, MD;  Location: [facility];  Service: Vascular;  Laterality: Left;  regional with MAC   BOWEL RESECTION  02/09/2020   Procedure: SMALL BOWEL RESECTION;  Surgeon: Desiderio Schanz, MD;  Location: [facility];  Service: General;;   DIALYSIS/PERMA CATHETER  INSERTION N/A 05/28/2023   Procedure: DIALYSIS/PERMA CATHETER INSERTION;  Surgeon: Jama Cordella MATSU, MD;  Location: [facility];  Service: Cardiovascular;  Laterality: N/A;   LAPAROSCOPIC TRANSABDOMINAL HERNIA N/A 09/12/2024   Procedure: REPAIR, HERNIA, ABDOMINAL APPROACH;  Surgeon: Marinda Jayson KIDD, MD;  Location: [facility];  Service: General;  Laterality: N/A;   LAPAROTOMY N/A 02/09/2020   Procedure: EXPLORATORY LAPAROTOMY;  Surgeon: Desiderio Schanz, MD;  Location: [facility];  Service: General;  Laterality: N/A;   LYSIS OF ADHESION  09/12/2024   Procedure: LAPAROTOMY, FOR LYSIS OF ADHESIONS;  Surgeon: Marinda Jayson KIDD, MD;  Location: [facility];  Service: General;;   OMENTECTOMY  09/12/2024   Procedure: OMENTECTOMY;  Surgeon: Marinda Jayson KIDD, MD;  Location: [facility];  Service: General;;    Family History  Problem Relation Age of Onset   Breast cancer Neg Hx     Social History:  reports that she has never smoked. She has never been exposed to tobacco smoke. She has never used smokeless tobacco. She reports that she does not drink alcohol and does not use drugs.  Allergies:  Allergies  Allergen Reactions   Ace Inhibitors Anaphylaxis   Lisinopril Anaphylaxis   Lipitor [Atorvastatin] Other (See Comments)    Muscle aches    Medications reviewed.    ROS Full ROS performed and is otherwise negative other than what is stated in HPI  BP 97/63   Pulse 72   Ht 4' 9 (1.448 m)   Wt 188 lb (85.3 kg)   SpO2 98%   BMI 40.68 kg/m   Physical Exam Alert and oriented x 3, on 2 L nasal cannula, able to transfer to the exam room table on her own, abdomen is soft, obese, nontender to palpation, no evidence of recurrent hernia, staples intact.    No results found for this or any previous visit (from the past 48 hours). No results found.  Assessment/Plan:  Patient is status post exploratory laparotomy with repair of obstructed hernia.  Staples were removed today.  There is a small  area in the middle part of her incision that is open with the subcutaneous tissue that is healing well with good granulation tissue.  There is no drainage from this.  There is no erythema around her incision.  We will plan to see her back again in several weeks to see how this heals for now she can put dry gauze over the area.  I discussed that there is high likelihood of recurrence of hernia given her BMI and the emergent nature of the procedure.  As far as her leukocytosis I am unsure of what is causing this but she is clinically doing well so we will continue to monitor this.   Jayson Endow, M.D. [facility]

## 2024-10-06 ENCOUNTER — Encounter: Payer: Self-pay | Admitting: General Surgery

## 2024-10-06 ENCOUNTER — Ambulatory Visit (INDEPENDENT_AMBULATORY_CARE_PROVIDER_SITE_OTHER): Admitting: General Surgery

## 2024-10-06 VITALS — BP 110/60 | HR 98 | Ht <= 58 in | Wt 188.0 lb

## 2024-10-06 DIAGNOSIS — Z09 Encounter for follow-up examination after completed treatment for conditions other than malignant neoplasm: Secondary | ICD-10-CM

## 2024-10-06 DIAGNOSIS — K430 Incisional hernia with obstruction, without gangrene: Secondary | ICD-10-CM

## 2024-10-06 DIAGNOSIS — K56609 Unspecified intestinal obstruction, unspecified as to partial versus complete obstruction: Secondary | ICD-10-CM

## 2024-10-06 NOTE — Progress Notes (Signed)
 Outpatient Surgical Follow Up  10/06/2024  Brianna [name] is an 63 y.o. female.   Chief Complaint  Patient presents with   Routine Post Op    HPI: Brianna [name] returns today status post repair of obstructing ventral hernia with Vicryl mesh.  At last visit she did have a small separation of the middle part of her wound but there is fleshy, healthy tissue underneath.  She reports doing well.  She is tolerating a diet and having normal bowel function.  She still having little bit of trouble breathing and is on 2 L nasal cannula but has been doing dialysis without any problems.  She denies any erythema around her incision or any drainage from the wound.  Past Medical History:  Diagnosis Date   Acid reflux    Anasarca    Anemia    Asthma    well controlled   Bilateral leg edema    Bronchopneumonia    Chronic heart failure with preserved ejection fraction (HFpEF) (HCC)    a. 12/2020 Echo: EF 70-75%, no rwma, mild LVH, GrI DD, nl RV fxn, triv MR/AI.   CKD (chronic kidney disease), stage IV (HCC)    DM (diabetes mellitus), type 2 (HCC)    Dyspnea    Hypertension    Hypokalemia    Morbid obesity (HCC)    Strangulated ventral hernia     Past Surgical History:  Procedure Laterality Date   A/V FISTULAGRAM Left 01/28/2024   Procedure: A/V Fistulagram;  Surgeon: Jama Cordella MATSU, MD;  Location: [facility];  Service: Cardiovascular;  Laterality: Left;   A/V FISTULAGRAM Left 04/14/2024   Procedure: A/V Fistulagram;  Surgeon: Jama Cordella MATSU, MD;  Location: [facility];  Service: Cardiovascular;  Laterality: Left;   AV FISTULA PLACEMENT Left 08/08/2023   Procedure: ARTERIOVENOUS (AV) FISTULA CREATION (RADIALCEPHALIC);  Surgeon: Marea Selinda RAMAN, MD;  Location: [facility];  Service: Vascular;  Laterality: Left;  regional with MAC   BOWEL RESECTION  02/09/2020   Procedure: SMALL BOWEL RESECTION;  Surgeon: Desiderio Schanz, MD;  Location: [facility];  Service: General;;    DIALYSIS/PERMA CATHETER INSERTION N/A 05/28/2023   Procedure: DIALYSIS/PERMA CATHETER INSERTION;  Surgeon: Jama Cordella MATSU, MD;  Location: [facility];  Service: Cardiovascular;  Laterality: N/A;   LAPAROSCOPIC TRANSABDOMINAL HERNIA N/A 09/12/2024   Procedure: REPAIR, HERNIA, ABDOMINAL APPROACH;  Surgeon: Marinda Jayson KIDD, MD;  Location: [facility];  Service: General;  Laterality: N/A;   LAPAROTOMY N/A 02/09/2020   Procedure: EXPLORATORY LAPAROTOMY;  Surgeon: Desiderio Schanz, MD;  Location: [facility];  Service: General;  Laterality: N/A;   LYSIS OF ADHESION  09/12/2024   Procedure: LAPAROTOMY, FOR LYSIS OF ADHESIONS;  Surgeon: Marinda Jayson KIDD, MD;  Location: [facility];  Service: General;;   OMENTECTOMY  09/12/2024   Procedure: OMENTECTOMY;  Surgeon: Marinda Jayson KIDD, MD;  Location: [facility];  Service: General;;    Family History  Problem Relation Age of Onset   Breast cancer Neg Hx     Social History:  reports that she has never smoked. She has never been exposed to tobacco smoke. She has never used smokeless tobacco. She reports that she does not drink alcohol and does not use drugs.  Allergies:  Allergies  Allergen Reactions   Ace Inhibitors Anaphylaxis   Lisinopril Anaphylaxis   Lipitor [Atorvastatin] Other (See Comments)    Muscle aches    Medications reviewed.    ROS Full ROS performed and is otherwise negative  other than what is stated in HPI   BP 110/60   Pulse 98   Ht 4' 9 (1.448 m)   Wt 188 lb (85.3 kg)   SpO2 94%   BMI 40.68 kg/m   Physical Exam Abdomen is soft, nontender and nondistended, her incision is healing well.  There are Steri-Strips at the top of the incision and at the middle part of the incision there is some separation but there is good granulation tissue at the base without any drainage.  The inferior part of the incision is closed with Steri-Strips in place.    No results found for this or any previous visit (from the past 48 hours). No  results found.  Assessment/Plan:  Patient status post ventral hernia repair.  Small separation of the skin in the middle of her incision without any evidence of infection.  Continue dry gauze and tape to the area.  We will see her again in 5 weeks. Jayson Endow, M.D. [facility]

## 2024-10-06 NOTE — Patient Instructions (Signed)
 Keep a clean dry gauze over the area until it heals fully.   Follow-up with our office in 6 weeks.   Please call and ask to speak with a nurse if you develop questions or concerns.

## 2024-10-12 ENCOUNTER — Emergency Department

## 2024-10-12 ENCOUNTER — Observation Stay
Admission: EM | Admit: 2024-10-12 | Discharge: 2024-10-15 | Disposition: A | Discharge status: Home Health | Attending: Internal Medicine | Admitting: Internal Medicine

## 2024-10-12 ENCOUNTER — Other Ambulatory Visit: Payer: Self-pay

## 2024-10-12 DIAGNOSIS — R197 Diarrhea, unspecified: Secondary | ICD-10-CM | POA: Insufficient documentation

## 2024-10-12 DIAGNOSIS — E1122 Type 2 diabetes mellitus with diabetic chronic kidney disease: Secondary | ICD-10-CM | POA: Insufficient documentation

## 2024-10-12 DIAGNOSIS — N186 End stage renal disease: Secondary | ICD-10-CM | POA: Diagnosis not present

## 2024-10-12 DIAGNOSIS — I132 Hypertensive heart and chronic kidney disease with heart failure and with stage 5 chronic kidney disease, or end stage renal disease: Secondary | ICD-10-CM | POA: Diagnosis not present

## 2024-10-12 DIAGNOSIS — E1165 Type 2 diabetes mellitus with hyperglycemia: Secondary | ICD-10-CM | POA: Insufficient documentation

## 2024-10-12 DIAGNOSIS — Z7982 Long term (current) use of aspirin: Secondary | ICD-10-CM | POA: Insufficient documentation

## 2024-10-12 DIAGNOSIS — R0609 Other forms of dyspnea: Secondary | ICD-10-CM | POA: Diagnosis present

## 2024-10-12 DIAGNOSIS — Z992 Dependence on renal dialysis: Secondary | ICD-10-CM | POA: Insufficient documentation

## 2024-10-12 DIAGNOSIS — G4733 Obstructive sleep apnea (adult) (pediatric): Secondary | ICD-10-CM | POA: Insufficient documentation

## 2024-10-12 DIAGNOSIS — Z7951 Long term (current) use of inhaled steroids: Secondary | ICD-10-CM | POA: Insufficient documentation

## 2024-10-12 DIAGNOSIS — E66813 Obesity, class 3: Secondary | ICD-10-CM | POA: Insufficient documentation

## 2024-10-12 DIAGNOSIS — R531 Weakness: Secondary | ICD-10-CM | POA: Insufficient documentation

## 2024-10-12 DIAGNOSIS — J9601 Acute respiratory failure with hypoxia: Secondary | ICD-10-CM | POA: Insufficient documentation

## 2024-10-12 DIAGNOSIS — Z6841 Body Mass Index (BMI) 40.0 and over, adult: Secondary | ICD-10-CM | POA: Insufficient documentation

## 2024-10-12 DIAGNOSIS — U071 COVID-19: Secondary | ICD-10-CM | POA: Diagnosis not present

## 2024-10-12 DIAGNOSIS — J4521 Mild intermittent asthma with (acute) exacerbation: Secondary | ICD-10-CM | POA: Diagnosis not present

## 2024-10-12 DIAGNOSIS — D631 Anemia in chronic kidney disease: Secondary | ICD-10-CM | POA: Insufficient documentation

## 2024-10-12 DIAGNOSIS — I5032 Chronic diastolic (congestive) heart failure: Secondary | ICD-10-CM | POA: Diagnosis not present

## 2024-10-12 DIAGNOSIS — Z79899 Other long term (current) drug therapy: Secondary | ICD-10-CM | POA: Diagnosis not present

## 2024-10-12 LAB — BASIC METABOLIC PANEL WITH GFR
Anion gap: 14 (ref 5–15)
BUN: 48 mg/dL — ABNORMAL HIGH (ref 8–23)
CO2: 24 mmol/L (ref 22–32)
Calcium: 9.1 mg/dL (ref 8.9–10.3)
Chloride: 98 mmol/L (ref 98–111)
Creatinine, Ser: 6.65 mg/dL — ABNORMAL HIGH (ref 0.44–1.00)
GFR, Estimated: 7 mL/min — ABNORMAL LOW (ref >60)
Glucose, Bld: 103 mg/dL — ABNORMAL HIGH (ref 70–99)
Potassium: 3.2 mmol/L — ABNORMAL LOW (ref 3.5–5.1)
Sodium: 136 mmol/L (ref 135–145)

## 2024-10-12 LAB — CBC WITH DIFFERENTIAL/PLATELET
Abs Immature Granulocytes: 0.03 K/uL (ref 0.00–0.07)
Basophils Absolute: 0 K/uL (ref 0.0–0.1)
Basophils Relative: 1 %
Eosinophils Absolute: 0.4 K/uL (ref 0.0–0.5)
Eosinophils Relative: 7 %
HCT: 25.9 % — ABNORMAL LOW (ref 36.0–46.0)
Hemoglobin: 7.9 g/dL — ABNORMAL LOW (ref 12.0–15.0)
Immature Granulocytes: 1 %
Lymphocytes Relative: 20 %
Lymphs Abs: 1.3 K/uL (ref 0.7–4.0)
MCH: 29.2 pg (ref 26.0–34.0)
MCHC: 30.5 g/dL (ref 30.0–36.0)
MCV: 95.6 fL (ref 80.0–100.0)
Monocytes Absolute: 0.6 K/uL (ref 0.1–1.0)
Monocytes Relative: 10 %
Neutro Abs: 4.1 K/uL (ref 1.7–7.7)
Neutrophils Relative %: 61 %
Platelets: 260 K/uL (ref 150–400)
RBC: 2.71 MIL/uL — ABNORMAL LOW (ref 3.87–5.11)
RDW: 16.1 % — ABNORMAL HIGH (ref 11.5–15.5)
WBC: 6.5 K/uL (ref 4.0–10.5)
nRBC: 0 % (ref 0.0–0.2)

## 2024-10-12 LAB — RESP PANEL BY RT-PCR (RSV, FLU A&B, COVID)  RVPGX2
Influenza A by PCR: NEGATIVE
Influenza B by PCR: NEGATIVE
Resp Syncytial Virus by PCR: NEGATIVE
SARS Coronavirus 2 by RT PCR: POSITIVE — AB

## 2024-10-12 LAB — TROPONIN I (HIGH SENSITIVITY)
Troponin I (High Sensitivity): 15 ng/L (ref <18)
Troponin I (High Sensitivity): 13 ng/L (ref <18)

## 2024-10-12 LAB — GLUCOSE, CAPILLARY
Glucose-Capillary: 205 mg/dL — ABNORMAL HIGH (ref 70–99)
Glucose-Capillary: 170 mg/dL — ABNORMAL HIGH (ref 70–99)
Glucose-Capillary: 170 mg/dL — ABNORMAL HIGH (ref 70–99)

## 2024-10-12 LAB — BRAIN NATRIURETIC PEPTIDE: B Natriuretic Peptide: 113 pg/mL — ABNORMAL HIGH (ref 0.0–100.0)

## 2024-10-12 LAB — LACTATE DEHYDROGENASE: LDH: 169 U/L (ref 98–192)

## 2024-10-12 LAB — D-DIMER, QUANTITATIVE: D-Dimer, Quant: 2.62 ug{FEU}/mL — ABNORMAL HIGH (ref 0.00–0.50)

## 2024-10-12 MED ORDER — ONDANSETRON HCL 4 MG/2ML IJ SOLN: 4.0000 mg | Freq: Four times a day (QID) | INTRAMUSCULAR | Status: DC | PRN

## 2024-10-12 MED ORDER — IOHEXOL 350 MG/ML SOLN
75.0000 mL | Freq: Once | INTRAVENOUS | Status: AC | PRN
  Administered 2024-10-12: 75 mL via INTRAVENOUS

## 2024-10-12 MED ORDER — SODIUM CHLORIDE 0.9% FLUSH
3.0000 mL | Freq: Two times a day (BID) | INTRAVENOUS | Status: DC
  Administered 2024-10-12 – 2024-10-15 (×7): 3 mL via INTRAVENOUS

## 2024-10-12 MED ORDER — CHLORHEXIDINE GLUCONATE CLOTH 2 % EX PADS
6.0000 | MEDICATED_PAD | Freq: Every day | CUTANEOUS | Status: DC
  Administered 2024-10-12 – 2024-10-15 (×4): 6 via TOPICAL
  Filled 2024-10-12: qty 6

## 2024-10-12 MED ORDER — ACETAMINOPHEN 650 MG RE SUPP: 650.0000 mg | Freq: Four times a day (QID) | RECTAL | Status: DC | PRN

## 2024-10-12 MED ORDER — IPRATROPIUM-ALBUTEROL 0.5-2.5 (3) MG/3ML IN SOLN
3.0000 mL | Freq: Once | RESPIRATORY_TRACT | Status: AC
  Administered 2024-10-12: 3 mL via RESPIRATORY_TRACT
  Filled 2024-10-12: qty 3

## 2024-10-12 MED ORDER — MIDODRINE HCL 5 MG PO TABS: 10.0000 mg | ORAL_TABLET | ORAL | Status: DC | PRN

## 2024-10-12 MED ORDER — DEXAMETHASONE SODIUM PHOSPHATE 4 MG/ML IJ SOLN
4.0000 mg | INTRAMUSCULAR | Status: DC
  Administered 2024-10-12 – 2024-10-13 (×2): 4 mg via INTRAVENOUS
  Filled 2024-10-12 (×2): qty 1

## 2024-10-12 MED ORDER — HEPARIN SODIUM (PORCINE) 1000 UNIT/ML IJ SOLN
INTRAMUSCULAR | Status: AC
  Filled 2024-10-12: qty 4

## 2024-10-12 MED ORDER — ONDANSETRON HCL 4 MG PO TABS: 4.0000 mg | ORAL_TABLET | Freq: Four times a day (QID) | ORAL | Status: DC | PRN

## 2024-10-12 MED ORDER — INSULIN ASPART 100 UNIT/ML IJ SOLN: 0.0000 [IU] | Freq: Every day | INTRAMUSCULAR | Status: DC

## 2024-10-12 MED ORDER — HEPARIN SODIUM (PORCINE) 5000 UNIT/ML IJ SOLN
5000.0000 [IU] | Freq: Three times a day (TID) | INTRAMUSCULAR | Status: DC
  Administered 2024-10-12 – 2024-10-15 (×10): 5000 [IU] via SUBCUTANEOUS
  Filled 2024-10-12 (×10): qty 1

## 2024-10-12 MED ORDER — METHYLPREDNISOLONE SODIUM SUCC 125 MG IJ SOLR
125.0000 mg | Freq: Once | INTRAMUSCULAR | Status: AC
  Administered 2024-10-12: 125 mg via INTRAVENOUS
  Filled 2024-10-12: qty 2

## 2024-10-12 MED ORDER — ACETAMINOPHEN 325 MG PO TABS
650.0000 mg | ORAL_TABLET | Freq: Four times a day (QID) | ORAL | Status: DC | PRN
  Administered 2024-10-12 – 2024-10-15 (×3): 650 mg via ORAL
  Filled 2024-10-12 (×3): qty 2

## 2024-10-12 MED ORDER — INSULIN ASPART 100 UNIT/ML IJ SOLN
0.0000 [IU] | Freq: Three times a day (TID) | INTRAMUSCULAR | Status: DC
  Administered 2024-10-12: 2 [IU] via SUBCUTANEOUS
  Administered 2024-10-12 – 2024-10-13 (×3): 1 [IU] via SUBCUTANEOUS
  Filled 2024-10-12 (×4): qty 1

## 2024-10-12 NOTE — ED Notes (Signed)
 Unable to get labs ordered for pt at this time.

## 2024-10-12 NOTE — ED Provider Notes (Addendum)
 [facility] Provider Note    Event Date/Time   First MD Initiated Contact with Patient 10/12/24 0501     (approximate)   History   Shortness of Breath   HPI  Brianna [name] is a 63 y.o. female with history of asthma, CHF, end-stage renal disease on hemodialysis, hypertension, diabetes who presents to the emergency department complaints of dyspnea on exertion that has progressively worsened.  Denies any chest pain, chest tightness or chest discomfort.  No fevers, chills.  No productive cough.  Feels like both of her legs have been slightly more swollen than normal.  Denies missing any dialysis sessions.  Satting 100% on room air but EMS placed patient on oxygen for comfort.  She denies history of PE or DVT but recently had hospitalization for small bowel obstruction requiring hernia repair.  States she was just recently discharged from [facility] and is back home.  [facility] has recently had a COVID-19 outbreak.   History provided by patient.    Past Medical History:  Diagnosis Date   Acid reflux    Anasarca    Anemia    Asthma    well controlled   Bilateral leg edema    Bronchopneumonia    Chronic heart failure with preserved ejection fraction (HFpEF) (HCC)    a. 12/2020 Echo: EF 70-75%, no rwma, mild LVH, GrI DD, nl RV fxn, triv MR/AI.   CKD (chronic kidney disease), stage IV (HCC)    DM (diabetes mellitus), type 2 (HCC)    Dyspnea    Hypertension    Hypokalemia    Morbid obesity (HCC)    Strangulated ventral hernia     Past Surgical History:  Procedure Laterality Date   A/V FISTULAGRAM Left 01/28/2024   Procedure: A/V Fistulagram;  Surgeon: Jama Cordella MATSU, MD;  Location: [facility];  Service: Cardiovascular;  Laterality: Left;   A/V FISTULAGRAM Left 04/14/2024   Procedure: A/V Fistulagram;  Surgeon: Jama Cordella MATSU, MD;  Location: [facility];  Service: Cardiovascular;  Laterality: Left;   AV FISTULA  PLACEMENT Left 08/08/2023   Procedure: ARTERIOVENOUS (AV) FISTULA CREATION (RADIALCEPHALIC);  Surgeon: Marea Selinda RAMAN, MD;  Location: [facility];  Service: Vascular;  Laterality: Left;  regional with MAC   BOWEL RESECTION  02/09/2020   Procedure: SMALL BOWEL RESECTION;  Surgeon: Desiderio Schanz, MD;  Location: [facility];  Service: General;;   DIALYSIS/PERMA CATHETER INSERTION N/A 05/28/2023   Procedure: DIALYSIS/PERMA CATHETER INSERTION;  Surgeon: Jama Cordella MATSU, MD;  Location: [facility];  Service: Cardiovascular;  Laterality: N/A;   LAPAROSCOPIC TRANSABDOMINAL HERNIA N/A 09/12/2024   Procedure: REPAIR, HERNIA, ABDOMINAL APPROACH;  Surgeon: Marinda Jayson KIDD, MD;  Location: [facility];  Service: General;  Laterality: N/A;   LAPAROTOMY N/A 02/09/2020   Procedure: EXPLORATORY LAPAROTOMY;  Surgeon: Desiderio Schanz, MD;  Location: [facility];  Service: General;  Laterality: N/A;   LYSIS OF ADHESION  09/12/2024   Procedure: LAPAROTOMY, FOR LYSIS OF ADHESIONS;  Surgeon: Marinda Jayson KIDD, MD;  Location: [facility];  Service: General;;   OMENTECTOMY  09/12/2024   Procedure: OMENTECTOMY;  Surgeon: Marinda Jayson KIDD, MD;  Location: [facility];  Service: General;;    MEDICATIONS:  Prior to Admission medications   Medication Sig Start Date End Date Taking? Authorizing Provider  albuterol  (PROVENTIL ) (2.5 MG/3ML) 0.083% nebulizer solution Take 2.5 mg by nebulization every 4 (four) hours as needed for shortness of breath or wheezing. 12/21/19   [provider]  albuterol  (VENTOLIN  HFA) 108 (90 Base) MCG/ACT inhaler Inhale 1-2 puffs into the lungs every 6 (six) hours as needed for wheezing or shortness of breath.    [provider]  allopurinol  (ZYLOPRIM ) 300 MG tablet Take 300 mg by mouth every morning. 12/21/19   [provider]  amLODipine  (NORVASC ) 5 MG tablet Take 5 mg by mouth daily. 02/11/24   [provider]  ASPIRIN  ADULT LOW STRENGTH 81 MG EC tablet Take 81 mg by mouth daily.  12/21/19   [provider]  carvedilol  (COREG ) 12.5 MG tablet Take 1 tablet (12.5 mg total) by mouth 2 (two) times daily before a meal. 05/31/23   Laurita Pillion, MD  cetirizine (ZYRTEC) 10 MG tablet Take 10 mg by mouth every morning. 09/18/19   [provider]  cyanocobalamin  (VITAMIN B12) 1000 MCG tablet Take 1 tablet (1,000 mcg total) by mouth daily. 05/31/23   Laurita Pillion, MD  FEROSUL 325 (65 Fe) MG tablet Take 325 mg by mouth every other day. 05/22/23   [provider]  furosemide  (LASIX ) 80 MG tablet Take 1 tablet (80 mg total) by mouth daily. For leg swelling or weight gain >5lbs 01/19/21   Sebastian Toribio GAILS, MD  lactulose  (CHRONULAC ) 10 GM/15ML solution Take 10 g by mouth daily as needed for mild constipation. 03/09/24   [provider]  omeprazole (PRILOSEC) 20 MG capsule Take 20 mg by mouth every morning. 12/21/19   [provider]  oxyCODONE  10 MG TABS Take 1 tablet (10 mg total) by mouth every 4 (four) hours as needed for severe pain (pain score 7-10). 09/24/24   Schulz, Zachary R, PA-C  OZEMPIC, 1 MG/DOSE, 4 MG/3ML SOPN Inject 1 mg into the skin once a week. 08/28/24   [provider]  sertraline  (ZOLOFT ) 25 MG tablet Take 25 mg by mouth daily. 01/23/24   [provider]  sevelamer  carbonate (RENVELA ) 800 MG tablet Take 1,600 mg by mouth 3 (three) times daily. 03/13/24   [provider]  triamcinolone ointment (KENALOG) 0.5 % Apply 1 Application topically 2 (two) times daily as needed. 07/01/23   [provider]  Vitamin D , Ergocalciferol , (DRISDOL) 1.25 MG (50000 UNIT) CAPS capsule Take 50,000 Units by mouth once a week. 11/20/23   [provider]  ZETIA  10 MG tablet Take 10 mg by mouth every morning. 06/20/23   [provider]  zolpidem  (AMBIEN ) 5 MG tablet Take 1 tablet (5 mg total) by mouth at bedtime as needed for sleep. 01/19/21   Sebastian Toribio GAILS, MD    Physical Exam   Triage Vital  Signs: ED Triage Vitals  Encounter Vitals Group     BP 10/12/24 0503 (!) 157/81     Girls Systolic BP Percentile --      Girls Diastolic BP Percentile --      Boys Systolic BP Percentile --      Boys Diastolic BP Percentile --      Pulse Rate 10/12/24 0503 79     Resp 10/12/24 0503 11     Temp 10/12/24 0503 98.3 F (36.8 C)     Temp Source 10/12/24 0503 Oral     SpO2 10/12/24 0503 100 %     Weight 10/12/24 0505 202 lb 4.8 oz (91.8 kg)     Height 10/12/24 0505 4' 9 (1.448 m)     Head Circumference --      [facility] --      Pain Score 10/12/24  0504 8     Pain Loc --      Pain Education --      Exclude from Growth Chart --     Most recent vital signs: Vitals:   10/12/24 0600 10/12/24 0700  BP: 119/66 130/61  Pulse: 80 83  Resp: 19 20  Temp:    SpO2: 100% 100%    CONSTITUTIONAL: Alert, responds appropriately to questions. Well-appearing; well-nourished, pleasant HEAD: Normocephalic, atraumatic EYES: Conjunctivae clear, pupils appear equal, sclera nonicteric ENT: normal nose; moist mucous membranes NECK: Supple, normal ROM CARD: RRR; S1 and S2 appreciated RESP: Normal chest excursion without splinting or tachypnea; breath sounds clear and equal bilaterally; no wheezes, no rhonchi, no rales, no hypoxia or respiratory distress, speaking full sentences ABD/GI: Non-distended; soft, non-tender, no rebound, no guarding, no peritoneal signs BACK: The back appears normal EXT: Normal ROM in all joints; no deformity noted, trace nonpitting edema in bilateral feet and ankles, no calf swelling or calf tenderness SKIN: Normal color for age and race; warm; no rash on exposed skin NEURO: Moves all extremities equally, normal speech PSYCH: The patient's mood and manner are appropriate.   ED Results / Procedures / Treatments   LABS: (all labs ordered are listed, but only abnormal results are displayed) Labs Reviewed  RESP PANEL BY RT-PCR (RSV, FLU A&B, COVID)  RVPGX2 - Abnormal;  Notable for the following components:      Result Value   SARS Coronavirus 2 by RT PCR POSITIVE (*)    All other components within normal limits  CBC WITH DIFFERENTIAL/PLATELET - Abnormal; Notable for the following components:   RBC 2.71 (*)    Hemoglobin 7.9 (*)    HCT 25.9 (*)    RDW 16.1 (*)    All other components within normal limits  BASIC METABOLIC PANEL WITH GFR - Abnormal; Notable for the following components:   Potassium 3.2 (*)    Glucose, Bld 103 (*)    BUN 48 (*)    Creatinine, Ser 6.65 (*)    GFR, Estimated 7 (*)    All other components within normal limits  BRAIN NATRIURETIC PEPTIDE - Abnormal; Notable for the following components:   B Natriuretic Peptide 113.0 (*)    All other components within normal limits  TROPONIN I (HIGH SENSITIVITY)  TROPONIN I (HIGH SENSITIVITY)     EKG:  EKG Interpretation Date/Time:  Monday October 12 2024 05:03:30 EDT Ventricular Rate:  79 PR Interval:  149 QRS Duration:  93 QT Interval:  421 QTC Calculation: 483 R Axis:   -70  Text Interpretation: Sinus or ectopic atrial rhythm Inferior infarct, old Abnormal lateral Q waves Anterior infarct, old Confirmed by Neomi Neptune [phone]) on 10/12/2024 7:44:16 AM         RADIOLOGY: My personal review and interpretation of imaging: Chest x-ray clear.  I have personally reviewed all radiology reports.   DG Chest Portable 1 View Result Date: 10/12/2024 EXAM: 1 VIEW XRAY OF THE CHEST 10/12/2024 06:10:11 AM COMPARISON: CTA chest 09/15/2024 and earlier. CLINICAL HISTORY: 63 year old female. SOB. FINDINGS: LINES, TUBES AND DEVICES: Stable right chest dual lumen vascular catheter. LUNGS AND PLEURA: Ongoing low lung volumes, although mildly improved. Small volume of fluid or atelectasis along the right minor fissure has increased. No other pleural fluid is visible. No pneumothorax. No focal pulmonary opacity. No pulmonary edema. HEART AND MEDIASTINUM: Calcified aortic atherosclerosis. No acute  abnormality of the cardiac silhouette. BONES AND SOFT TISSUES: No acute osseous abnormality. ABDOMEN: Paucity  of bowel gas. IMPRESSION: 1. Low lung volumes with increased fluid or atelectasis along the right minor fissure. 2. No other acute cardiopulmonary abnormality. Electronically signed by: Helayne Hurst MD 10/12/2024 06:59 AM EDT RP Workstation: [id number]     PROCEDURES:  Critical Care performed: No     .1-3 Lead EKG Interpretation  Performed by: Trent Gabler, Josette SAILOR, DO Authorized by: Chick Cousins, Josette SAILOR, DO     Interpretation: normal     ECG rate:  83   ECG rate assessment: normal     Rhythm: sinus rhythm     Ectopy: none     Conduction: normal       IMPRESSION / MDM / ASSESSMENT AND PLAN / ED COURSE  I reviewed the triage vital signs and the nursing notes.    Patient here with increasing shortness of breath.  No chest pain.  The patient is on the cardiac monitor to evaluate for evidence of arrhythmia and/or significant heart rate changes.   DIFFERENTIAL DIAGNOSIS (includes but not limited to):   Asthma exacerbation, pulmonary edema, PE, pneumonia, ACS, viral URI   Patient's presentation is most consistent with acute presentation with potential threat to life or bodily function.   PLAN: Will obtain COVID and flu swab, chest x-ray, troponin x 2.  Given recent hospitalization and surgery, we discussed she is high risk for PE.  Will need a CTA of her chest.  She has been on dialysis for over a year.  No missed dialysis sessions and does not currently appear volume overloaded.  Does have history of asthma but is not wheezing.  Will give breathing treatment though to see if this helps with symptom management.   MEDICATIONS GIVEN IN ED: Medications  ipratropium-albuterol  (DUONEB) 0.5-2.5 (3) MG/3ML nebulizer solution 3 mL (has no administration in time range)  methylPREDNISolone  sodium succinate (SOLU-MEDROL ) 125 mg/2 mL injection 125 mg (has no administration in time range)   ipratropium-albuterol  (DUONEB) 0.5-2.5 (3) MG/3ML nebulizer solution 3 mL (3 mLs Nebulization Given 10/12/24 0537)  iohexol  (OMNIPAQUE ) 350 MG/ML injection 75 mL (75 mLs Intravenous Contrast Given 10/12/24 0741)     ED COURSE: Labs show hemoglobin of 7.9.  Most recent was 8.7.  Likely secondary to chronic kidney disease.  Troponin negative at 15.  Repeat pending.  BNP is 113.  Chest x-ray reviewed and interpreted by myself and the radiologist and shows no acute abnormality.  EKG nonischemic.  She is positive for COVID-19 which is likely the cause of her worsening symptoms today.  CTA of the chest pending.  Will also obtain ambulatory sat.  Signed out the oncoming ED provider at 7:47 AM.  8:08 AM  On my reexamination patient now audibly wheezing, tachypneic, increased work of breathing.  Suspect that she is having an asthma exacerbation that is being worsened by COVID-19.  Will continue breathing treatments and give IV steroids.  Anticipate admission.  Oncoming ED provider updated.  Second troponin, CTA of the chest pending.   CONSULTS: Pending further workup.   OUTSIDE RECORDS REVIEWED: Reviewed recent hospitalization notes.       FINAL CLINICAL IMPRESSION(S) / ED DIAGNOSES   Final diagnoses:  DOE (dyspnea on exertion)  COVID-19  Exacerbation of intermittent asthma, unspecified asthma severity     Rx / DC Orders   ED Discharge Orders     None        Note:  This document was prepared using Dragon voice recognition software and may include unintentional dictation errors.   Hasna Stefanik, Josette SAILOR,  DO 10/12/24 0747    Dewey Neukam, Josette SAILOR, DO 10/12/24 9190

## 2024-10-12 NOTE — ED Notes (Signed)
 Pt taken to CT.

## 2024-10-12 NOTE — H&P (Signed)
 History and Physical    Patient: Brianna [name] FMW:[id number] DOB: Jul 05, 1961 DOA: 10/12/2024 DOS: the patient was seen and examined on 10/12/2024 PCP: Cecily Katz, PA-C  Patient coming from: Home  Chief Complaint:  Chief Complaint  Patient presents with   Shortness of Breath   HPI: Brianna [name] is a 63 y.o. female with medical history significant of sleep apnea on CPAP, CKD on dialysis, diabetes mellitus 2, hypertension, asthma, anemia.  Patient was recently hospitalized last month after emergent hernia repair secondary to incarcerated hernia.  She required rehabilitative therapies at a local SNF.  She was recently discharged [facility] this past Friday.  Patient reports that this past Thursday she began feeling weak and was having hot and cold flashes, had generalized achiness and myalgias, nausea and a dry cough.  She states that on Friday prior to discharge she was given several red pills but she was not sure what these pills were.  Since that time her symptoms have progressed she has developed rhinorrhea she has developed shortness of breath with activity.  She denies any missed hemodialysis noting she dialyzes on MWF.  Upon my evaluation of the patient she was short of breath when talking and visibly dyspneic.  She states she lives alone and requires a rolling walker to ambulate.  She states she is very weak.  COVID test here was positive.  She states she was using as needed oxygen at the facility for shortness of breath.  Oxygen has been utilized here and given her sats have decreased to 95% at rest.  Vital signs are otherwise stable.  Single view chest x-ray here was unremarkable.  CTA of the chest here was negative for PE.  There was stable collapse consolidation of the right middle and lower lobes and collapse/consolidation in the medial left base has nearly resolved compared to previous films.  Hospital service has been asked to evaluate the patient for  admission.   Review of Systems: As mentioned in the history of present illness. All other systems reviewed and are negative. Past Medical History:  Diagnosis Date   Acid reflux    Anasarca    Anemia    Asthma    well controlled   Bilateral leg edema    Bronchopneumonia    Chronic heart failure with preserved ejection fraction (HFpEF) (HCC)    a. 12/2020 Echo: EF 70-75%, no rwma, mild LVH, GrI DD, nl RV fxn, triv MR/AI.   CKD (chronic kidney disease), stage IV (HCC)    DM (diabetes mellitus), type 2 (HCC)    Dyspnea    Hypertension    Hypokalemia    Morbid obesity (HCC)    Strangulated ventral hernia    Past Surgical History:  Procedure Laterality Date   A/V FISTULAGRAM Left 01/28/2024   Procedure: A/V Fistulagram;  Surgeon: Jama Cordella MATSU, MD;  Location: [facility];  Service: Cardiovascular;  Laterality: Left;   A/V FISTULAGRAM Left 04/14/2024   Procedure: A/V Fistulagram;  Surgeon: Jama Cordella MATSU, MD;  Location: [facility];  Service: Cardiovascular;  Laterality: Left;   AV FISTULA PLACEMENT Left 08/08/2023   Procedure: ARTERIOVENOUS (AV) FISTULA CREATION (RADIALCEPHALIC);  Surgeon: Marea Selinda RAMAN, MD;  Location: [facility];  Service: Vascular;  Laterality: Left;  regional with MAC   BOWEL RESECTION  02/09/2020   Procedure: SMALL BOWEL RESECTION;  Surgeon: Desiderio Schanz, MD;  Location: [facility];  Service: General;;   DIALYSIS/PERMA CATHETER INSERTION N/A 05/28/2023   Procedure:  DIALYSIS/PERMA CATHETER INSERTION;  Surgeon: Jama Cordella MATSU, MD;  Location: [facility];  Service: Cardiovascular;  Laterality: N/A;   LAPAROSCOPIC TRANSABDOMINAL HERNIA N/A 09/12/2024   Procedure: REPAIR, HERNIA, ABDOMINAL APPROACH;  Surgeon: Marinda Jayson KIDD, MD;  Location: [facility];  Service: General;  Laterality: N/A;   LAPAROTOMY N/A 02/09/2020   Procedure: EXPLORATORY LAPAROTOMY;  Surgeon: Desiderio Schanz, MD;  Location: [facility];  Service: General;  Laterality: N/A;   LYSIS  OF ADHESION  09/12/2024   Procedure: LAPAROTOMY, FOR LYSIS OF ADHESIONS;  Surgeon: Marinda Jayson KIDD, MD;  Location: [facility];  Service: General;;   OMENTECTOMY  09/12/2024   Procedure: OMENTECTOMY;  Surgeon: Marinda Jayson KIDD, MD;  Location: [facility];  Service: General;;   Social History:  reports that she has never smoked. She has never been exposed to tobacco smoke. She has never used smokeless tobacco. She reports that she does not drink alcohol and does not use drugs.  Allergies  Allergen Reactions   Ace Inhibitors Anaphylaxis   Lisinopril Anaphylaxis   Lipitor [Atorvastatin] Other (See Comments)    Muscle aches    Family History  Problem Relation Age of Onset   Breast cancer Neg Hx     Prior to Admission medications   Medication Sig Start Date End Date Taking? Authorizing Provider  albuterol  (PROVENTIL ) (2.5 MG/3ML) 0.083% nebulizer solution Take 2.5 mg by nebulization every 4 (four) hours as needed for shortness of breath or wheezing. 12/21/19   [provider]  albuterol  (VENTOLIN  HFA) 108 (90 Base) MCG/ACT inhaler Inhale 1-2 puffs into the lungs every 6 (six) hours as needed for wheezing or shortness of breath.    [provider]  allopurinol  (ZYLOPRIM ) 300 MG tablet Take 300 mg by mouth every morning. 12/21/19   [provider]  amLODipine  (NORVASC ) 5 MG tablet Take 5 mg by mouth daily. 02/11/24   [provider]  ASPIRIN  ADULT LOW STRENGTH 81 MG EC tablet Take 81 mg by mouth daily. 12/21/19   [provider]  carvedilol  (COREG ) 12.5 MG tablet Take 1 tablet (12.5 mg total) by mouth 2 (two) times daily before a meal. 05/31/23   Laurita Pillion, MD  cetirizine (ZYRTEC) 10 MG tablet Take 10 mg by mouth every morning. 09/18/19   [provider]  cyanocobalamin  (VITAMIN B12) 1000 MCG tablet Take 1 tablet (1,000 mcg total) by mouth daily. 05/31/23   Laurita Pillion, MD  FEROSUL 325 (65 Fe) MG tablet Take 325 mg by mouth every other day.  05/22/23   [provider]  furosemide  (LASIX ) 80 MG tablet Take 1 tablet (80 mg total) by mouth daily. For leg swelling or weight gain >5lbs 01/19/21   Sebastian Toribio GAILS, MD  lactulose  (CHRONULAC ) 10 GM/15ML solution Take 10 g by mouth daily as needed for mild constipation. 03/09/24   [provider]  omeprazole (PRILOSEC) 20 MG capsule Take 20 mg by mouth every morning. 12/21/19   [provider]  oxyCODONE  10 MG TABS Take 1 tablet (10 mg total) by mouth every 4 (four) hours as needed for severe pain (pain score 7-10). 09/24/24   Schulz, Zachary R, PA-C  OZEMPIC, 1 MG/DOSE, 4 MG/3ML SOPN Inject 1 mg into the skin once a week. 08/28/24   [provider]  sertraline  (ZOLOFT ) 25 MG tablet Take 25 mg by mouth daily. 01/23/24   [provider]  sevelamer  carbonate (RENVELA ) 800 MG tablet Take 1,600 mg by mouth 3 (three) times daily. 03/13/24  [provider]  triamcinolone ointment (KENALOG) 0.5 % Apply 1 Application topically 2 (two) times daily as needed. 07/01/23   [provider]  Vitamin D , Ergocalciferol , (DRISDOL) 1.25 MG (50000 UNIT) CAPS capsule Take 50,000 Units by mouth once a week. 11/20/23   [provider]  ZETIA  10 MG tablet Take 10 mg by mouth every morning. 06/20/23   [provider]  zolpidem  (AMBIEN ) 5 MG tablet Take 1 tablet (5 mg total) by mouth at bedtime as needed for sleep. 01/19/21   Sebastian Toribio GAILS, MD    Physical Exam: Vitals:   10/12/24 1000 10/12/24 1015 10/12/24 1030 10/12/24 1045  BP: 110/80  127/81   Pulse: 87 88 88 84  Resp: 19 18 (!) 21 (!) 21  Temp:      TempSrc:      SpO2: 95% 96% 98% 98%  Weight:      Height:       Constitutional: NAD, calm, comfortable Respiratory: Coarse to auscultation.  Decreased in the bases.. Normal respiratory effort. No accessory muscle use.  O2 sats decreased to 95% when oxygen removed briefly. Cardiovascular: Regular rate and rhythm, no murmurs / rubs /  gallops. No extremity edema. 2+ pedal pulses.  Abdomen: no tenderness, no masses palpated. No hepatosplenomegaly. Bowel sounds positive.  Musculoskeletal: no clubbing / cyanosis. No joint deformity upper and lower extremities. Good ROM, no contractures. Normal muscle tone.  Skin: no rashes, lesions, ulcers. No induration Neurologic: CN 2-12 grossly intact. Sensation intact, Strength 5/5 x all 4 extremities.  Psychiatric: Normal judgment and insight. Alert and oriented x 3. Normal mood.   Data Reviewed:  Sodium 136, potassium 3.2, glucose 103, BUN 48, creatinine 6.65  BNP 113, troponin 15 and 13  WBC 6500, hemoglobin 7.9, platelets 260,000  Viral respiratory panel positive for COVID  Chest x-ray and CTA chest as above  Assessment and Plan: Acute hypoxic respiratory failure secondary to COVID-19 infection History of asthma Patient has symptomatic COVID respiratory illness Continue supportive care with oxygen Begin Decadron  4 mg IV daily Check inflammatory markers: CRP, D-dimer and LDH Patient is not wheezing but may benefit from short acting albuterol  MDI and/or combination with budesonide Ambulatory pulse oximetry  CKD on dialysis Appreciate nephrology assistance Dialysis planned for today Resume BMD medications as prior to admission  Diabetes mellitus 2 Follow CBGs and provide SSI Last month hemoglobin A1c was 5.6  Hypertension Treated with dialysis  OSA on CPAP Continue CPAP on home settings    Advance Care Planning:   Code Status: Full Code   VTE prophylaxis: Subcutaneous heparin   Consults: Nephrology  Family Communication: Patient only  Severity of Illness: The appropriate patient status for this patient is OBSERVATION. Observation status is judged to be reasonable and necessary in order to provide the required intensity of service to ensure the patient's safety. The patient's presenting symptoms, physical exam findings, and initial radiographic and laboratory  data in the context of their medical condition is felt to place them at decreased risk for further clinical deterioration. Furthermore, it is anticipated that the patient will be medically stable for discharge from the hospital within 2 midnights of admission.   Author: Isaiah Lever, NP 10/12/2024 11:00 AM  For on call review [email or web address].

## 2024-10-12 NOTE — Progress Notes (Signed)
 Hemodialysis Note:  Received patient in bed, alert and oriented. Informed consent singed and in chart.  Treatment initiated: 1620 Treatment completed: 1920  Access used: Right internal jugular catheter Access issues: None  Patient tolerated well, alert and without acute distress. Report given to patient's RN.  Total UF removed: 2 Liters Medications given: None  Post HD weight: 84.7 Kg  [facility]

## 2024-10-12 NOTE — ED Notes (Signed)
 Pt transported to 133 by RN Lang

## 2024-10-12 NOTE — ED Notes (Signed)
 Fall risk bundle in place.

## 2024-10-12 NOTE — Progress Notes (Signed)
 Pt receives out-pt HD at [facility] MWF @ 6:15am schedule time. Navigator following to assist with any HD needs.   Suzen Satchel Dialysis Navigator [email or web address]

## 2024-10-12 NOTE — ED Notes (Signed)
Pt given breakfast tray and sitting up eating.

## 2024-10-12 NOTE — ED Triage Notes (Signed)
 Pt BIB [facility] c/o [facility]. Hx asthma, 100% on RA. Dialysis MWF  Pt was d/c'd home on Friday.SABRA 97% RA, 70, 120/90 for EMS

## 2024-10-12 NOTE — Progress Notes (Signed)
 [facility]  ROUNDING NOTE   Subjective:   Brianna [name] is a 63 year old female with past medical conditions including obstructive sleep apnea on CPAP, gout, GERD, type 2 diabetes, and end-stage renal disease on hemodialysis. Patient presents to ED with shortness of breath and has been admitted for DOE (dyspnea on exertion) [R06.09] Exacerbation of intermittent asthma, unspecified asthma severity [J45.21] Acute COVID-19 [U07.1] COVID-19 [U07.1]  Patient is known to our practice and receives outpatient dialysis treatments on MWF at [facility], supervised by Dr marcelino. Last treatment completed on Friday, full treatment without complications. Patient was recently admitted for hernia repair and was discharged to rehab. Patient has completed her rehab and discharge home on Friday. States she has been having progressive shortness of breath this weekend. Has maintained oral intake. Generalized malaise.   Labs unremarkable for renal patient. BNP 113 with Hgb 7.9. Positive covid 19. Chest xray shows low lung volumes with increased fluid or atelectasis in right minor fissure. CT angio chest shows consolidation in right middle and lower lobes as well as left medial base.   We have been consulted to manage dialysis needs during this admission.    Objective:  Vital signs in last 24 hours:  Temp:  [98 F (36.7 C)-98.3 F (36.8 C)] 98 F (36.7 C) (10/13 0916) Pulse Rate:  [75-88] 84 (10/13 1045) Resp:  [11-23] 21 (10/13 1045) BP: (110-157)/(61-91) 127/81 (10/13 1030) SpO2:  [95 %-100 %] 98 % (10/13 1045) Weight:  [91.8 kg] 91.8 kg (10/13 0505)  Weight change:  Filed Weights   10/12/24 0505  Weight: 91.8 kg    Intake/Output: No intake/output data recorded.   Intake/Output this shift:  No intake/output data recorded.  Physical Exam: General: NAD  Head: Normocephalic  Eyes: Anicteric  Lungs:  Clear, 2L [place]  Heart: Regular rate  Abdomen:  Soft  Extremities:  No  peripheral edema.  Neurologic: Alert, oriented  Skin: No rashes  Access: Rt chest permcath, Left AVF maturing    Basic Metabolic Panel: Recent Labs  Lab 10/12/24 0536  NA 136  K 3.2*  CL 98  CO2 24  GLUCOSE 103*  BUN 48*  CREATININE 6.65*  CALCIUM 9.1    Liver Function Tests: No results for input(s): AST, ALT, ALKPHOS, BILITOT, PROT, ALBUMIN  in the last 168 hours.  No results for input(s): LIPASE, AMYLASE in the last 168 hours. No results for input(s): AMMONIA in the last 168 hours.  CBC: Recent Labs  Lab 10/12/24 0536  WBC 6.5  NEUTROABS 4.1  HGB 7.9*  HCT 25.9*  MCV 95.6  PLT 260    Cardiac Enzymes: No results for input(s): CKTOTAL, CKMB, CKMBINDEX, TROPONINI in the last 168 hours.  BNP: Invalid input(s): POCBNP  CBG: No results for input(s): GLUCAP in the last 168 hours.   Microbiology: Results for orders placed or performed during the hospital encounter of 10/12/24  Resp panel by RT-PCR (RSV, Flu A&B, Covid) Anterior Nasal Swab     Status: Abnormal   Collection Time: 10/12/24  5:36 AM   Specimen: Anterior Nasal Swab  Result Value Ref Range Status   SARS Coronavirus 2 by RT PCR POSITIVE (A) NEGATIVE Final    Comment: (NOTE) SARS-CoV-2 target nucleic acids are DETECTED.  The SARS-CoV-2 RNA is generally detectable in upper respiratory specimens during the acute phase of infection. Positive results are indicative of the presence of the identified virus, but do not rule out bacterial infection or co-infection with other pathogens not detected by  the test. Clinical correlation with patient history and other diagnostic information is necessary to determine patient infection status. The expected result is Negative.  Fact Sheet for Patients: BloggerCourse.com  Fact Sheet for Healthcare Providers: SeriousBroker.it  This test is not yet approved or cleared by the [place] FDA and  has been authorized for detection and/or diagnosis of SARS-CoV-2 by FDA under an Emergency Use Authorization (EUA).  This EUA will remain in effect (meaning this test can be used) for the duration of  the COVID-19 declaration under Section 564(b)(1) of the A ct, 21 U.S.C. section 360bbb-3(b)(1), unless the authorization is terminated or revoked sooner.     Influenza A by PCR NEGATIVE NEGATIVE Final   Influenza B by PCR NEGATIVE NEGATIVE Final    Comment: (NOTE) The Xpert Xpress SARS-CoV-2/FLU/RSV plus assay is intended as an aid in the diagnosis of influenza from Nasopharyngeal swab specimens and should not be used as a sole basis for treatment. Nasal washings and aspirates are unacceptable for Xpert Xpress SARS-CoV-2/FLU/RSV testing.  Fact Sheet for Patients: BloggerCourse.com  Fact Sheet for Healthcare Providers: SeriousBroker.it  This test is not yet approved or cleared by the [place]  FDA and has been authorized for detection and/or diagnosis of SARS-CoV-2 by FDA under an Emergency Use Authorization (EUA). This EUA will remain in effect (meaning this test can be used) for the duration of the COVID-19 declaration under Section 564(b)(1) of the Act, 21 U.S.C. section 360bbb-3(b)(1), unless the authorization is terminated or revoked.     Resp Syncytial Virus by PCR NEGATIVE NEGATIVE Final    Comment: (NOTE) Fact Sheet for Patients: BloggerCourse.com  Fact Sheet for Healthcare Providers: SeriousBroker.it  This test is not yet approved or cleared by the [place]  FDA and has been authorized for detection and/or diagnosis of SARS-CoV-2 by FDA under an Emergency Use Authorization (EUA). This EUA will remain in effect (meaning this test can be used) for the duration of the COVID-19 declaration under Section 564(b)(1) of the Act, 21 U.S.C. section  360bbb-3(b)(1), unless the authorization is terminated or revoked.  Performed at [facility], [address] [place], [place] [address]     Coagulation Studies: No results for input(s): LABPROT, INR in the last 72 hours.  Urinalysis: No results for input(s): COLORURINE, LABSPEC, PHURINE, GLUCOSEU, HGBUR, BILIRUBINUR, KETONESUR, PROTEINUR, UROBILINOGEN, NITRITE, LEUKOCYTESUR in the last 72 hours.  Invalid input(s): APPERANCEUR    Imaging: CT Angio Chest PE W and/or Wo Contrast Result Date: 10/12/2024 CLINICAL DATA:  Short of breath. Clinical concern for pulmonary embolus. EXAM: CT ANGIOGRAPHY CHEST WITH CONTRAST TECHNIQUE: Multidetector CT imaging of the chest was performed using the standard protocol during bolus administration of intravenous contrast. Multiplanar CT image reconstructions and MIPs were obtained to evaluate the vascular anatomy. RADIATION DOSE REDUCTION: This exam was performed according to the departmental dose-optimization program which includes automated exposure control, adjustment of the mA and/or kV according to patient size and/or use of iterative reconstruction technique. CONTRAST:  75mL OMNIPAQUE  IOHEXOL  350 MG/ML SOLN COMPARISON:  09/15/2024 FINDINGS: Cardiovascular: The heart is enlarged. Trace pericardial effusion evident. Coronary artery calcification is evident. Mild atherosclerotic calcification is noted in the wall of the thoracic aorta. Right-sided central line tip is positioned in the right atrium. There is no filling defect within the opacified pulmonary arteries to suggest the presence of an acute pulmonary embolus. Mediastinum/Nodes: No mediastinal lymphadenopathy. There is no hilar lymphadenopathy. Tiny hiatal hernia. The esophagus has normal imaging features. There is no axillary lymphadenopathy.  Lungs/Pleura: Collapse/consolidation in the right middle and lower lobes is similar to prior. Collapse/consolidation seen in  the medial left base previously has nearly resolved. No overt pulmonary edema. No pleural effusion. Upper Abdomen: No acute findings Musculoskeletal: No worrisome lytic or sclerotic osseous abnormality. Review of the MIP images confirms the above findings. IMPRESSION: 1. No CT evidence for acute pulmonary embolus. 2. Collapse/consolidation in the right middle and lower lobes is similar to prior. Collapse/consolidation seen in the medial left base previously has nearly resolved. 3. Tiny hiatal hernia. 4.  Aortic Atherosclerosis (ICD10-I70.0). Electronically Signed   By: Camellia Candle M.D.   On: 10/12/2024 08:18   DG Chest Portable 1 View Result Date: 10/12/2024 EXAM: 1 VIEW XRAY OF THE CHEST 10/12/2024 06:10:11 AM COMPARISON: CTA chest 09/15/2024 and earlier. CLINICAL HISTORY: 63 year old female. SOB. FINDINGS: LINES, TUBES AND DEVICES: Stable right chest dual lumen vascular catheter. LUNGS AND PLEURA: Ongoing low lung volumes, although mildly improved. Small volume of fluid or atelectasis along the right minor fissure has increased. No other pleural fluid is visible. No pneumothorax. No focal pulmonary opacity. No pulmonary edema. HEART AND MEDIASTINUM: Calcified aortic atherosclerosis. No acute abnormality of the cardiac silhouette. BONES AND SOFT TISSUES: No acute osseous abnormality. ABDOMEN: Paucity of bowel gas. IMPRESSION: 1. Low lung volumes with increased fluid or atelectasis along the right minor fissure. 2. No other acute cardiopulmonary abnormality. Electronically signed by: Helayne Hurst MD 10/12/2024 06:59 AM EDT RP Workstation: [id number]     Medications:     Chlorhexidine  Gluconate Cloth  6 each Topical Q0600   dexamethasone  (DECADRON ) injection  4 mg Intravenous Q24H   heparin   5,000 Units Subcutaneous Q8H   insulin  aspart  0-5 Units Subcutaneous QHS   insulin  aspart  0-6 Units Subcutaneous TID WC   sodium chloride  flush  3 mL Intravenous Q12H   acetaminophen  **OR** acetaminophen ,  ondansetron  **OR** ondansetron  (ZOFRAN ) IV  Assessment/ Plan:  [name] is a 63 y.o.  female  with past medical conditions including obstructive sleep apnea on CPAP, gout, GERD, type 2 diabetes, and end-stage renal disease on hemodialysis.  Patient is currently admitted for DOE (dyspnea on exertion) [R06.09] Exacerbation of intermittent asthma, unspecified asthma severity [J45.21] Acute COVID-19 [U07.1] COVID-19 [U07.1]   [facility]/MWF/right IJ PermCath/left upper AVF.    End-stage renal disease on hemodialysis.  Continue MWF schedule. Last treatment received on Friday. Will receive dialysis later today.     2. Anemia of chronic kidney disease Hemoglobin & Hematocrit     Component Value Date/Time   HGB 7.9 (L) 10/12/2024 0536   HGB 14.6 04/22/2013 1549   HCT 25.9 (L) 10/12/2024 0536   HCT 44.0 04/22/2013 1549   Hgb decreased. Will order Retacrit  10000 units IV with dialysis.    3.  Secondary Hyperparathyroidism: with outpatient labs: PTH 993, phosphorus 4.9, calcium 9.5 on 09/07/24.   Calcium 9.1, awaiting updated phos. Will continue to monitor bone minerals.   4. Diabetes mellitus type II with chronic kidney disease/renal manifestations: noninsulin dependent. Home regimen includes Ozempic. Most recent hemoglobin A1c is 5.6 on 09/13/24.        LOS: 0 Brianna [name] 10/13/202511:33 AM

## 2024-10-13 DIAGNOSIS — U071 COVID-19: Secondary | ICD-10-CM | POA: Diagnosis not present

## 2024-10-13 LAB — GLUCOSE, CAPILLARY
Glucose-Capillary: 90 mg/dL (ref 70–99)
Glucose-Capillary: 167 mg/dL — ABNORMAL HIGH (ref 70–99)
Glucose-Capillary: 169 mg/dL — ABNORMAL HIGH (ref 70–99)
Glucose-Capillary: 152 mg/dL — ABNORMAL HIGH (ref 70–99)

## 2024-10-13 LAB — RENAL FUNCTION PANEL
Albumin: 2.8 g/dL — ABNORMAL LOW (ref 3.5–5.0)
Anion gap: 11 (ref 5–15)
BUN: 29 mg/dL — ABNORMAL HIGH (ref 8–23)
CO2: 29 mmol/L (ref 22–32)
Calcium: 9.4 mg/dL (ref 8.9–10.3)
Chloride: 96 mmol/L — ABNORMAL LOW (ref 98–111)
Creatinine, Ser: 4.51 mg/dL — ABNORMAL HIGH (ref 0.44–1.00)
GFR, Estimated: 10 mL/min — ABNORMAL LOW (ref >60)
Glucose, Bld: 97 mg/dL (ref 70–99)
Phosphorus: 3.7 mg/dL (ref 2.5–4.6)
Potassium: 3.9 mmol/L (ref 3.5–5.1)
Sodium: 136 mmol/L (ref 135–145)

## 2024-10-13 LAB — GASTROINTESTINAL PANEL BY PCR, STOOL (REPLACES STOOL CULTURE)

## 2024-10-13 LAB — C DIFFICILE QUICK SCREEN W PCR REFLEX
C Diff antigen: NEGATIVE
C Diff interpretation: NOT DETECTED
C Diff toxin: NEGATIVE

## 2024-10-13 LAB — C-REACTIVE PROTEIN: CRP: 0.8 mg/dL (ref <1.0)

## 2024-10-13 LAB — HEPATITIS B SURFACE ANTIGEN: Hepatitis B Surface Ag: NONREACTIVE

## 2024-10-13 MED ORDER — ASPIRIN 81 MG PO TBEC
81.0000 mg | DELAYED_RELEASE_TABLET | Freq: Every day | ORAL | Status: DC
  Administered 2024-10-13 – 2024-10-15 (×3): 81 mg via ORAL
  Filled 2024-10-13 (×3): qty 1

## 2024-10-13 MED ORDER — LACTULOSE 10 GM/15ML PO SOLN
10.0000 g | Freq: Every day | ORAL | Status: DC | PRN
Start: 2024-10-13 — End: 2024-10-13

## 2024-10-13 MED ORDER — AMLODIPINE BESYLATE 5 MG PO TABS
5.0000 mg | ORAL_TABLET | Freq: Every day | ORAL | Status: DC
  Administered 2024-10-13 – 2024-10-15 (×3): 5 mg via ORAL
  Filled 2024-10-13 (×3): qty 1

## 2024-10-13 MED ORDER — SEVELAMER CARBONATE 800 MG PO TABS
1600.0000 mg | ORAL_TABLET | Freq: Three times a day (TID) | ORAL | Status: DC
  Administered 2024-10-13 – 2024-10-15 (×7): 1600 mg via ORAL
  Filled 2024-10-13 (×8): qty 2

## 2024-10-13 MED ORDER — OXYCODONE HCL 5 MG PO TABS
10.0000 mg | ORAL_TABLET | ORAL | Status: DC | PRN
  Administered 2024-10-13 (×2): 10 mg via ORAL
  Filled 2024-10-13 (×3): qty 2

## 2024-10-13 MED ORDER — SERTRALINE HCL 50 MG PO TABS
50.0000 mg | ORAL_TABLET | Freq: Every day | ORAL | Status: DC
  Administered 2024-10-13 – 2024-10-15 (×3): 50 mg via ORAL
  Filled 2024-10-13 (×3): qty 1

## 2024-10-13 MED ORDER — LOPERAMIDE HCL 2 MG PO CAPS
2.0000 mg | ORAL_CAPSULE | ORAL | Status: DC | PRN
  Administered 2024-10-14 (×3): 2 mg via ORAL
  Filled 2024-10-13 (×5): qty 1

## 2024-10-13 NOTE — Plan of Care (Signed)
   Problem: Education: Goal: Ability to describe self-care measures that may prevent or decrease complications (Diabetes Survival Skills Education) will improve Outcome: Progressing   Problem: Coping: Goal: Ability to adjust to condition or change in health will improve Outcome: Progressing   Problem: Fluid Volume: Goal: Ability to maintain a balanced intake and output will improve Outcome: Progressing   Problem: Health Behavior/Discharge Planning: Goal: Ability to identify and utilize available resources and services will improve Outcome: Progressing Goal: Ability to manage health-related needs will improve Outcome: Progressing   Problem: Metabolic: Goal: Ability to maintain appropriate glucose levels will improve Outcome: Progressing   Problem: Nutritional: Goal: Maintenance of adequate nutrition will improve Outcome: Progressing Goal: Progress toward achieving an optimal weight will improve Outcome: Progressing   Problem: Skin Integrity: Goal: Risk for impaired skin integrity will decrease Outcome: Progressing   Problem: Tissue Perfusion: Goal: Adequacy of tissue perfusion will improve Outcome: Progressing   Problem: Education: Goal: Knowledge of General Education information will improve Description: Including pain rating scale, medication(s)/side effects and non-pharmacologic comfort measures Outcome: Progressing   Problem: Health Behavior/Discharge Planning: Goal: Ability to manage health-related needs will improve Outcome: Progressing

## 2024-10-13 NOTE — Progress Notes (Signed)
 [facility]  ROUNDING NOTE   Subjective:   Brianna [name] is a 63 year old female with past medical conditions including obstructive sleep apnea on CPAP, gout, GERD, type 2 diabetes, and end-stage renal disease on hemodialysis. Patient presents to ED with shortness of breath and has been admitted for DOE (dyspnea on exertion) [R06.09] Exacerbation of intermittent asthma, unspecified asthma severity [J45.21] Acute COVID-19 [U07.1] COVID-19 [U07.1]  Patient is known to our practice and receives outpatient dialysis treatments on MWF at [facility] , supervised by Dr marcelino.   Patient seen laying in bed Remains on 2L [place] Appetite appropriate   Objective:  Vital signs in last 24 hours:  Temp:  [98.3 F (36.8 C)-98.7 F (37.1 C)] 98.3 F (36.8 C) (10/14 0358) Pulse Rate:  [77-87] 81 (10/14 0816) Resp:  [18-20] 18 (10/14 0816) BP: (106-144)/(63-79) 139/69 (10/14 0816) SpO2:  [99 %-100 %] 100 % (10/14 1018) Weight:  [84.7 kg-86.7 kg] 84.7 kg (10/13 1920)  Weight change: -5.063 kg Filed Weights   10/12/24 0505 10/12/24 1541 10/12/24 1920  Weight: 91.8 kg 86.7 kg 84.7 kg    Intake/Output: I/O last 3 completed shifts: In: 240 [P.O.:240] Out: 2000 [Other:2000]   Intake/Output this shift:  No intake/output data recorded.  Physical Exam: General: NAD  Head: Normocephalic  Eyes: Anicteric  Lungs:  Clear, 2L [place]  Heart: Regular rate  Abdomen:  Soft  Extremities:  No peripheral edema.  Neurologic: Alert, oriented  Skin: No rashes  Access: Rt chest permcath, Left AVF maturing    Basic Metabolic Panel: Recent Labs  Lab 10/12/24 0536 10/13/24 0538  NA 136 136  K 3.2* 3.9  CL 98 96*  CO2 24 29  GLUCOSE 103* 97  BUN 48* 29*  CREATININE 6.65* 4.51*  CALCIUM 9.1 9.4  PHOS  --  3.7    Liver Function Tests: Recent Labs  Lab 10/13/24 0538  ALBUMIN  2.8*    No results for input(s): LIPASE, AMYLASE in the last 168 hours. No results for  input(s): AMMONIA in the last 168 hours.  CBC: Recent Labs  Lab 10/12/24 0536  WBC 6.5  NEUTROABS 4.1  HGB 7.9*  HCT 25.9*  MCV 95.6  PLT 260    Cardiac Enzymes: No results for input(s): CKTOTAL, CKMB, CKMBINDEX, TROPONINI in the last 168 hours.  BNP: Invalid input(s): POCBNP  CBG: Recent Labs  Lab 10/12/24 1208 10/12/24 1713 10/12/24 2148 10/13/24 0819  GLUCAP 205* 170* 170* 90     Microbiology: Results for orders placed or performed during the hospital encounter of 10/12/24  Resp panel by RT-PCR (RSV, Flu A&B, Covid) Anterior Nasal Swab     Status: Abnormal   Collection Time: 10/12/24  5:36 AM   Specimen: Anterior Nasal Swab  Result Value Ref Range Status   SARS Coronavirus 2 by RT PCR POSITIVE (A) NEGATIVE Final    Comment: (NOTE) SARS-CoV-2 target nucleic acids are DETECTED.  The SARS-CoV-2 RNA is generally detectable in upper respiratory specimens during the acute phase of infection. Positive results are indicative of the presence of the identified virus, but do not rule out bacterial infection or co-infection with other pathogens not detected by the test. Clinical correlation with patient history and other diagnostic information is necessary to determine patient infection status. The expected result is Negative.  Fact Sheet for Patients: BloggerCourse.com  Fact Sheet for Healthcare Providers: SeriousBroker.it  This test is not yet approved or cleared by the [place]  FDA and  has been authorized for detection  and/or diagnosis of SARS-CoV-2 by FDA under an Emergency Use Authorization (EUA).  This EUA will remain in effect (meaning this test can be used) for the duration of  the COVID-19 declaration under Section 564(b)(1) of the A ct, 21 U.S.C. section 360bbb-3(b)(1), unless the authorization is terminated or revoked sooner.     Influenza A by PCR NEGATIVE NEGATIVE Final   Influenza  B by PCR NEGATIVE NEGATIVE Final    Comment: (NOTE) The Xpert Xpress SARS-CoV-2/FLU/RSV plus assay is intended as an aid in the diagnosis of influenza from Nasopharyngeal swab specimens and should not be used as a sole basis for treatment. Nasal washings and aspirates are unacceptable for Xpert Xpress SARS-CoV-2/FLU/RSV testing.  Fact Sheet for Patients: BloggerCourse.com  Fact Sheet for Healthcare Providers: SeriousBroker.it  This test is not yet approved or cleared by the [place]  FDA and has been authorized for detection and/or diagnosis of SARS-CoV-2 by FDA under an Emergency Use Authorization (EUA). This EUA will remain in effect (meaning this test can be used) for the duration of the COVID-19 declaration under Section 564(b)(1) of the Act, 21 U.S.C. section 360bbb-3(b)(1), unless the authorization is terminated or revoked.     Resp Syncytial Virus by PCR NEGATIVE NEGATIVE Final    Comment: (NOTE) Fact Sheet for Patients: BloggerCourse.com  Fact Sheet for Healthcare Providers: SeriousBroker.it  This test is not yet approved or cleared by the [place]  FDA and has been authorized for detection and/or diagnosis of SARS-CoV-2 by FDA under an Emergency Use Authorization (EUA). This EUA will remain in effect (meaning this test can be used) for the duration of the COVID-19 declaration under Section 564(b)(1) of the Act, 21 U.S.C. section 360bbb-3(b)(1), unless the authorization is terminated or revoked.  Performed at [facility], [address] [place], [place] [address]   C Difficile Quick Screen w PCR reflex     Status: None   Collection Time: 10/13/24 10:00 AM   Specimen: STOOL  Result Value Ref Range Status   C Diff antigen NEGATIVE NEGATIVE Final   C Diff toxin NEGATIVE NEGATIVE Final   C Diff interpretation No C. difficile detected.  Final     Comment: Performed at [facility], [address] [place], [place] [address]    Coagulation Studies: No results for input(s): LABPROT, INR in the last 72 hours.  Urinalysis: No results for input(s): COLORURINE, LABSPEC, PHURINE, GLUCOSEU, HGBUR, BILIRUBINUR, KETONESUR, PROTEINUR, UROBILINOGEN, NITRITE, LEUKOCYTESUR in the last 72 hours.  Invalid input(s): APPERANCEUR    Imaging: CT Angio Chest PE W and/or Wo Contrast Result Date: 10/12/2024 CLINICAL DATA:  Short of breath. Clinical concern for pulmonary embolus. EXAM: CT ANGIOGRAPHY CHEST WITH CONTRAST TECHNIQUE: Multidetector CT imaging of the chest was performed using the standard protocol during bolus administration of intravenous contrast. Multiplanar CT image reconstructions and MIPs were obtained to evaluate the vascular anatomy. RADIATION DOSE REDUCTION: This exam was performed according to the departmental dose-optimization program which includes automated exposure control, adjustment of the mA and/or kV according to patient size and/or use of iterative reconstruction technique. CONTRAST:  75mL OMNIPAQUE  IOHEXOL  350 MG/ML SOLN COMPARISON:  09/15/2024 FINDINGS: Cardiovascular: The heart is enlarged. Trace pericardial effusion evident. Coronary artery calcification is evident. Mild atherosclerotic calcification is noted in the wall of the thoracic aorta. Right-sided central line tip is positioned in the right atrium. There is no filling defect within the opacified pulmonary arteries to suggest the presence of an acute pulmonary embolus. Mediastinum/Nodes: No mediastinal lymphadenopathy. There is  no hilar lymphadenopathy. Tiny hiatal hernia. The esophagus has normal imaging features. There is no axillary lymphadenopathy. Lungs/Pleura: Collapse/consolidation in the right middle and lower lobes is similar to prior. Collapse/consolidation seen in the medial left base previously has nearly resolved. No overt  pulmonary edema. No pleural effusion. Upper Abdomen: No acute findings Musculoskeletal: No worrisome lytic or sclerotic osseous abnormality. Review of the MIP images confirms the above findings. IMPRESSION: 1. No CT evidence for acute pulmonary embolus. 2. Collapse/consolidation in the right middle and lower lobes is similar to prior. Collapse/consolidation seen in the medial left base previously has nearly resolved. 3. Tiny hiatal hernia. 4.  Aortic Atherosclerosis (ICD10-I70.0). Electronically Signed   By: Camellia Candle M.D.   On: 10/12/2024 08:18   DG Chest Portable 1 View Result Date: 10/12/2024 EXAM: 1 VIEW XRAY OF THE CHEST 10/12/2024 06:10:11 AM COMPARISON: CTA chest 09/15/2024 and earlier. CLINICAL HISTORY: 63 year old female. SOB. FINDINGS: LINES, TUBES AND DEVICES: Stable right chest dual lumen vascular catheter. LUNGS AND PLEURA: Ongoing low lung volumes, although mildly improved. Small volume of fluid or atelectasis along the right minor fissure has increased. No other pleural fluid is visible. No pneumothorax. No focal pulmonary opacity. No pulmonary edema. HEART AND MEDIASTINUM: Calcified aortic atherosclerosis. No acute abnormality of the cardiac silhouette. BONES AND SOFT TISSUES: No acute osseous abnormality. ABDOMEN: Paucity of bowel gas. IMPRESSION: 1. Low lung volumes with increased fluid or atelectasis along the right minor fissure. 2. No other acute cardiopulmonary abnormality. Electronically signed by: Helayne Hurst MD 10/12/2024 06:59 AM EDT RP Workstation: [id number]     Medications:     amLODipine   5 mg Oral Daily   aspirin  EC  81 mg Oral Daily   Chlorhexidine  Gluconate Cloth  6 each Topical Q0600   dexamethasone  (DECADRON ) injection  4 mg Intravenous Q24H   heparin   5,000 Units Subcutaneous Q8H   insulin  aspart  0-5 Units Subcutaneous QHS   insulin  aspart  0-6 Units Subcutaneous TID WC   sertraline   50 mg Oral Daily   sevelamer  carbonate  1,600 mg Oral TID WC   sodium  chloride flush  3 mL Intravenous Q12H   acetaminophen  **OR** acetaminophen , midodrine , ondansetron  **OR** ondansetron  (ZOFRAN ) IV, oxyCODONE   Assessment/ Plan:  [name] is a 63 y.o.  female  with past medical conditions including obstructive sleep apnea on CPAP, gout, GERD, type 2 diabetes, and end-stage renal disease on hemodialysis.  Patient is currently admitted for DOE (dyspnea on exertion) [R06.09] Exacerbation of intermittent asthma, unspecified asthma severity [J45.21] Acute COVID-19 [U07.1] COVID-19 [U07.1]   [facility]/MWF/right IJ PermCath/left upper AVF.    End-stage renal disease on hemodialysis.  Continue MWF schedule. Received dialysis yesterday, UF 2L achieved. Next treatment scheduled for Wednesday.    2. Anemia of chronic kidney disease Hemoglobin & Hematocrit     Component Value Date/Time   HGB 7.9 (L) 10/12/2024 0536   HGB 14.6 04/22/2013 1549   HCT 25.9 (L) 10/12/2024 0536   HCT 44.0 04/22/2013 1549   Hgb borderline. Will order Retacrit  10000 units IV with dialysis tomorrow.    3.  Secondary Hyperparathyroidism: with outpatient labs: PTH 993, phosphorus 4.9, calcium 9.5 on 09/07/24.   Bone minerals acceptable during this admission.   4. Diabetes mellitus type II with chronic kidney disease/renal manifestations: noninsulin dependent. Home regimen includes Ozempic. Most recent hemoglobin A1c is 5.6 on 09/13/24.    Primary team to continue management of SSI     LOS: 0  Malick Netz 10/14/202512:13 PM

## 2024-10-13 NOTE — Progress Notes (Signed)
  Chaplain On-Call received calls from Unit Secretary Neville and RN Joane regarding the patient's request for Advance Directives information. Chaplain delivered the AD documents to RN Candis, who stated that the Staff would take the documents to the patient, due to COVID isolation.  Later, RN Joane called and provided speaker phone contact for the Chaplain to speak with patient's Cousin, Diane Carver. Chaplain answered questions from Ms. Carver, and assured her that she did not need to be present for the patient to sign the HCPOA document. Chaplain explained the requirement of two Hospital Volunteer Witnesses and an Chief Operating Officer to make the document official.  Chaplain will refer this matter to the Day Chaplain for tomorrow, when [facility] Volunteers will more likely be available.  Chaplain Bebe Ardean EMERSON Hershal., [facility]

## 2024-10-13 NOTE — Progress Notes (Addendum)
 Progress Note    Brianna [name]  FMW:[id number] DOB: 1961/01/26  DOA: 10/12/2024 PCP: Brianna Katz, PA-C      Brief Narrative:    Medical records reviewed and are as summarized below:  Brianna [name] is a 63 y.o. female  with past medical history significant for incarcerated ventral hernia s/p ex lap on 09/13/2024, ESRD on hemodialysis on MWF schedule, chronic HFpEF, OSA on CPAP at night, asthma, anemia of chronic disease, chronic pain syndrome, GERD, gout, who was recently discharged from the nursing home to home on Friday, 10/09/2024.  Reportedly, there was a COVID outbreak at the nursing facility.  Since she got home, she developed a cough and shortness of breath that is worse with exertion.  She presented to the hospital because of worsening symptoms.   She tested positive for SARS-CoV-2.    CTA chest IMPRESSION: 1. No CT evidence for acute pulmonary embolus. 2. Collapse/consolidation in the right middle and lower lobes is similar to prior. Collapse/consolidation seen in the medial left base previously has nearly resolved. 3. Tiny hiatal hernia. 4.  Aortic Atherosclerosis (ICD10-I70.0).   She was admitted to the hospital for COVID-19 infection.      Assessment/Plan:   Principal Problem:   Acute COVID-19   Body mass index is 40.41 kg/m.  (Class III obesity)   Acute COVID-19 infection: She is tolerating room air.  Supportive care as needed.  Discontinue IV dexamethasone . Shortness of breath with exertion but no documented hypoxia.   Acute diarrheal illness: This is likely due to COVID-19 infection.  Stool for C. difficile toxin and GI panel was negative. Imodium as needed for diarrhea.   General Weakness: Encouraged ambulation as tolerated.   ESRD, secondary hyperparathyroidism: Follow-up with nephrologist for hemodialysis.   Type II DM with hyperglycemia: Use NovoLog  as needed for hyperglycemia.   Comorbidities include gout, history  of asthma, chronic HFpEF, OSA on CPAP at night, chronic pain syndrome, anemia of chronic disease    Diet Order             Diet Carb Modified Fluid consistency: Thin; Room service appropriate? Yes; Fluid restriction: 1500 mL Fluid  Diet effective now                                  Consultants: Nephrologist  Procedures: None    Medications:    amLODipine   5 mg Oral Daily   aspirin  EC  81 mg Oral Daily   Chlorhexidine  Gluconate Cloth  6 each Topical Q0600   heparin   5,000 Units Subcutaneous Q8H   insulin  aspart  0-5 Units Subcutaneous QHS   insulin  aspart  0-6 Units Subcutaneous TID WC   sertraline   50 mg Oral Daily   sevelamer  carbonate  1,600 mg Oral TID WC   sodium chloride  flush  3 mL Intravenous Q12H   Continuous Infusions:   Anti-infectives (From admission, onward)    None              Family Communication/Anticipated D/C date and plan/Code Status   DVT prophylaxis: heparin  injection 5,000 Units Start: 10/12/24 1400     Code Status: Full Code  Family Communication: None Disposition Plan: Plan to discharge home   Status is: Observation The patient will require care spanning > 2 midnights and should be moved to inpatient because: Diarrhea, general weakness       Subjective:   Interval events noted.  She complains of watery diarrhea x 4, abdominal pain and general weakness. No vomiting.  She is still coughing but breathing is better.  Objective:    Vitals:   10/12/24 1920 10/13/24 0358 10/13/24 0816 10/13/24 1018  BP: (!) 141/73 136/70 139/69   Pulse: 82 79 81   Resp: 18 18 18    Temp: 98.3 F (36.8 C) 98.3 F (36.8 C)    TempSrc: Oral Oral    SpO2: 100% 100% 100% 100%  Weight: 84.7 kg     Height:       No data found.   Intake/Output Summary (Last 24 hours) at 10/13/2024 1652 Last data filed at 10/12/2024 1920 Gross per 24 hour  Intake 240 ml  Output 2000 ml  Net -1760 ml   Filed Weights   10/12/24  0505 10/12/24 1541 10/12/24 1920  Weight: 91.8 kg 86.7 kg 84.7 kg    Exam:  GEN: NAD SKIN: Warm and dry dry EYES: No pallor or icterus ENT: MMM CV: RRR PULM: CTA B ABD: soft, obese, NT, +BS, recent surgical incisions have healed nicely CNS: AAO x 3, non focal EXT: No edema or tenderness        Data Reviewed:   I have personally reviewed following labs and imaging studies:  Labs: Labs show the following:   Basic Metabolic Panel: Recent Labs  Lab 10/12/24 0536 10/13/24 0538  NA 136 136  K 3.2* 3.9  CL 98 96*  CO2 24 29  GLUCOSE 103* 97  BUN 48* 29*  CREATININE 6.65* 4.51*  CALCIUM 9.1 9.4  PHOS  --  3.7   GFR Estimated Creatinine Clearance: 11.6 mL/min (A) (by C-G formula based on SCr of 4.51 mg/dL (H)). Liver Function Tests: Recent Labs  Lab 10/13/24 0538  ALBUMIN  2.8*   No results for input(s): LIPASE, AMYLASE in the last 168 hours. No results for input(s): AMMONIA in the last 168 hours. Coagulation profile No results for input(s): INR, PROTIME in the last 168 hours.  CBC: Recent Labs  Lab 10/12/24 0536  WBC 6.5  NEUTROABS 4.1  HGB 7.9*  HCT 25.9*  MCV 95.6  PLT 260   Cardiac Enzymes: No results for input(s): CKTOTAL, CKMB, CKMBINDEX, TROPONINI in the last 168 hours. BNP (last 3 results) No results for input(s): PROBNP in the last 8760 hours. CBG: Recent Labs  Lab 10/12/24 1208 10/12/24 1713 10/12/24 2148 10/13/24 0819 10/13/24 1216  GLUCAP 205* 170* 170* 90 167*   D-Dimer: Recent Labs    10/12/24 1952  DDIMER 2.62*   Hgb A1c: No results for input(s): HGBA1C in the last 72 hours. Lipid Profile: No results for input(s): CHOL, HDL, LDLCALC, TRIG, CHOLHDL, LDLDIRECT in the last 72 hours. Thyroid function studies: No results for input(s): TSH, T4TOTAL, T3FREE, THYROIDAB in the last 72 hours.  Invalid input(s): FREET3 Anemia work up: No results for input(s): VITAMINB12, FOLATE,  FERRITIN, TIBC, IRON, RETICCTPCT in the last 72 hours. Sepsis Labs: Recent Labs  Lab 10/12/24 0536  WBC 6.5    Microbiology Recent Results (from the past 240 hours)  Resp panel by RT-PCR (RSV, Flu A&B, Covid) Anterior Nasal Swab     Status: Abnormal   Collection Time: 10/12/24  5:36 AM   Specimen: Anterior Nasal Swab  Result Value Ref Range Status   SARS Coronavirus 2 by RT PCR POSITIVE (A) NEGATIVE Final    Comment: (NOTE) SARS-CoV-2 target nucleic acids are DETECTED.  The SARS-CoV-2 RNA is generally detectable in upper respiratory specimens during  the acute phase of infection. Positive results are indicative of the presence of the identified virus, but do not rule out bacterial infection or co-infection with other pathogens not detected by the test. Clinical correlation with patient history and other diagnostic information is necessary to determine patient infection status. The expected result is Negative.  Fact Sheet for Patients: BloggerCourse.com  Fact Sheet for Healthcare Providers: SeriousBroker.it  This test is not yet approved or cleared by the [place]  FDA and  has been authorized for detection and/or diagnosis of SARS-CoV-2 by FDA under an Emergency Use Authorization (EUA).  This EUA will remain in effect (meaning this test can be used) for the duration of  the COVID-19 declaration under Section 564(b)(1) of the A ct, 21 U.S.C. section 360bbb-3(b)(1), unless the authorization is terminated or revoked sooner.     Influenza A by PCR NEGATIVE NEGATIVE Final   Influenza B by PCR NEGATIVE NEGATIVE Final    Comment: (NOTE) The Xpert Xpress SARS-CoV-2/FLU/RSV plus assay is intended as an aid in the diagnosis of influenza from Nasopharyngeal swab specimens and should not be used as a sole basis for treatment. Nasal washings and aspirates are unacceptable for Xpert Xpress  SARS-CoV-2/FLU/RSV testing.  Fact Sheet for Patients: BloggerCourse.com  Fact Sheet for Healthcare Providers: SeriousBroker.it  This test is not yet approved or cleared by the [place]  FDA and has been authorized for detection and/or diagnosis of SARS-CoV-2 by FDA under an Emergency Use Authorization (EUA). This EUA will remain in effect (meaning this test can be used) for the duration of the COVID-19 declaration under Section 564(b)(1) of the Act, 21 U.S.C. section 360bbb-3(b)(1), unless the authorization is terminated or revoked.     Resp Syncytial Virus by PCR NEGATIVE NEGATIVE Final    Comment: (NOTE) Fact Sheet for Patients: BloggerCourse.com  Fact Sheet for Healthcare Providers: SeriousBroker.it  This test is not yet approved or cleared by the [place]  FDA and has been authorized for detection and/or diagnosis of SARS-CoV-2 by FDA under an Emergency Use Authorization (EUA). This EUA will remain in effect (meaning this test can be used) for the duration of the COVID-19 declaration under Section 564(b)(1) of the Act, 21 U.S.C. section 360bbb-3(b)(1), unless the authorization is terminated or revoked.  Performed at [facility], [address] [place], [place] [address]   C Difficile Quick Screen w PCR reflex     Status: None   Collection Time: 10/13/24 10:00 AM   Specimen: STOOL  Result Value Ref Range Status   C Diff antigen NEGATIVE NEGATIVE Final   C Diff toxin NEGATIVE NEGATIVE Final   C Diff interpretation No C. difficile detected.  Final    Comment: Performed at [facility], [address] [place], [place] [address]  Gastrointestinal Panel by PCR , Stool     Status: None   Collection Time: 10/13/24 10:00 AM   Specimen: Stool  Result Value Ref Range Status   Campylobacter species NOT DETECTED NOT DETECTED Final   Plesimonas  shigelloides NOT DETECTED NOT DETECTED Final   Salmonella species NOT DETECTED NOT DETECTED Final   Yersinia enterocolitica NOT DETECTED NOT DETECTED Final   Vibrio species NOT DETECTED NOT DETECTED Final   Vibrio cholerae NOT DETECTED NOT DETECTED Final   Enteroaggregative E coli (EAEC) NOT DETECTED NOT DETECTED Final   Enteropathogenic E coli (EPEC) NOT DETECTED NOT DETECTED Final   Enterotoxigenic E coli (ETEC) NOT DETECTED NOT DETECTED Final   Shiga like toxin producing E coli (  STEC) NOT DETECTED NOT DETECTED Final   Shigella/Enteroinvasive E coli (EIEC) NOT DETECTED NOT DETECTED Final   Cryptosporidium NOT DETECTED NOT DETECTED Final   Cyclospora cayetanensis NOT DETECTED NOT DETECTED Final   Entamoeba histolytica NOT DETECTED NOT DETECTED Final   Giardia lamblia NOT DETECTED NOT DETECTED Final   Adenovirus F40/41 NOT DETECTED NOT DETECTED Final   Astrovirus NOT DETECTED NOT DETECTED Final   Norovirus GI/GII NOT DETECTED NOT DETECTED Final   Rotavirus A NOT DETECTED NOT DETECTED Final   Sapovirus (I, II, IV, and V) NOT DETECTED NOT DETECTED Final    Comment: Performed at [facility], [address] [place], [place] [address]    Procedures and diagnostic studies:  CT Angio Chest PE W and/or Wo Contrast Result Date: 10/12/2024 CLINICAL DATA:  Short of breath. Clinical concern for pulmonary embolus. EXAM: CT ANGIOGRAPHY CHEST WITH CONTRAST TECHNIQUE: Multidetector CT imaging of the chest was performed using the standard protocol during bolus administration of intravenous contrast. Multiplanar CT image reconstructions and MIPs were obtained to evaluate the vascular anatomy. RADIATION DOSE REDUCTION: This exam was performed according to the departmental dose-optimization program which includes automated exposure control, adjustment of the mA and/or kV according to patient size and/or use of iterative reconstruction technique. CONTRAST:  75mL OMNIPAQUE  IOHEXOL  350 MG/ML SOLN  COMPARISON:  09/15/2024 FINDINGS: Cardiovascular: The heart is enlarged. Trace pericardial effusion evident. Coronary artery calcification is evident. Mild atherosclerotic calcification is noted in the wall of the thoracic aorta. Right-sided central line tip is positioned in the right atrium. There is no filling defect within the opacified pulmonary arteries to suggest the presence of an acute pulmonary embolus. Mediastinum/Nodes: No mediastinal lymphadenopathy. There is no hilar lymphadenopathy. Tiny hiatal hernia. The esophagus has normal imaging features. There is no axillary lymphadenopathy. Lungs/Pleura: Collapse/consolidation in the right middle and lower lobes is similar to prior. Collapse/consolidation seen in the medial left base previously has nearly resolved. No overt pulmonary edema. No pleural effusion. Upper Abdomen: No acute findings Musculoskeletal: No worrisome lytic or sclerotic osseous abnormality. Review of the MIP images confirms the above findings. IMPRESSION: 1. No CT evidence for acute pulmonary embolus. 2. Collapse/consolidation in the right middle and lower lobes is similar to prior. Collapse/consolidation seen in the medial left base previously has nearly resolved. 3. Tiny hiatal hernia. 4.  Aortic Atherosclerosis (ICD10-I70.0). Electronically Signed   By: Camellia Candle M.D.   On: 10/12/2024 08:18   DG Chest Portable 1 View Result Date: 10/12/2024 EXAM: 1 VIEW XRAY OF THE CHEST 10/12/2024 06:10:11 AM COMPARISON: CTA chest 09/15/2024 and earlier. CLINICAL HISTORY: 63 year old female. SOB. FINDINGS: LINES, TUBES AND DEVICES: Stable right chest dual lumen vascular catheter. LUNGS AND PLEURA: Ongoing low lung volumes, although mildly improved. Small volume of fluid or atelectasis along the right minor fissure has increased. No other pleural fluid is visible. No pneumothorax. No focal pulmonary opacity. No pulmonary edema. HEART AND MEDIASTINUM: Calcified aortic atherosclerosis. No acute  abnormality of the cardiac silhouette. BONES AND SOFT TISSUES: No acute osseous abnormality. ABDOMEN: Paucity of bowel gas. IMPRESSION: 1. Low lung volumes with increased fluid or atelectasis along the right minor fissure. 2. No other acute cardiopulmonary abnormality. Electronically signed by: Helayne Hurst MD 10/12/2024 06:59 AM EDT RP Workstation: [id number]               LOS: 0 days   Zebbie Ace  [facility]   Pager on [email or web address]. If 7PM-7AM, please contact night-coverage at [email or web address]  10/13/2024, 4:52 PM

## 2024-10-14 DIAGNOSIS — U071 COVID-19: Secondary | ICD-10-CM | POA: Diagnosis not present

## 2024-10-14 LAB — CBC
HCT: 23.4 % — ABNORMAL LOW (ref 36.0–46.0)
Hemoglobin: 7.3 g/dL — ABNORMAL LOW (ref 12.0–15.0)
MCH: 29.3 pg (ref 26.0–34.0)
MCHC: 31.2 g/dL (ref 30.0–36.0)
MCV: 94 fL (ref 80.0–100.0)
Platelets: 236 K/uL (ref 150–400)
RBC: 2.49 MIL/uL — ABNORMAL LOW (ref 3.87–5.11)
RDW: 16.4 % — ABNORMAL HIGH (ref 11.5–15.5)
WBC: 5.2 K/uL (ref 4.0–10.5)
nRBC: 0 % (ref 0.0–0.2)

## 2024-10-14 LAB — RENAL FUNCTION PANEL
Albumin: 2.9 g/dL — ABNORMAL LOW (ref 3.5–5.0)
Anion gap: 14 (ref 5–15)
BUN: 39 mg/dL — ABNORMAL HIGH (ref 8–23)
CO2: 26 mmol/L (ref 22–32)
Calcium: 9.4 mg/dL (ref 8.9–10.3)
Chloride: 96 mmol/L — ABNORMAL LOW (ref 98–111)
Creatinine, Ser: 5.21 mg/dL — ABNORMAL HIGH (ref 0.44–1.00)
GFR, Estimated: 9 mL/min — ABNORMAL LOW (ref >60)
Glucose, Bld: 91 mg/dL (ref 70–99)
Phosphorus: 4.2 mg/dL (ref 2.5–4.6)
Potassium: 3.9 mmol/L (ref 3.5–5.1)
Sodium: 136 mmol/L (ref 135–145)

## 2024-10-14 LAB — GLUCOSE, CAPILLARY
Glucose-Capillary: 82 mg/dL (ref 70–99)
Glucose-Capillary: 122 mg/dL — ABNORMAL HIGH (ref 70–99)
Glucose-Capillary: 112 mg/dL — ABNORMAL HIGH (ref 70–99)
Glucose-Capillary: 128 mg/dL — ABNORMAL HIGH (ref 70–99)

## 2024-10-14 LAB — PREPARE RBC (CROSSMATCH)

## 2024-10-14 LAB — MAGNESIUM: Magnesium: 1.9 mg/dL (ref 1.7–2.4)

## 2024-10-14 MED ORDER — HEPARIN SODIUM (PORCINE) 1000 UNIT/ML IJ SOLN
INTRAMUSCULAR | Status: AC
  Filled 2024-10-14: qty 5

## 2024-10-14 MED ORDER — SALINE SPRAY 0.65 % NA SOLN
1.0000 | NASAL | Status: DC | PRN
  Administered 2024-10-14: 1 via NASAL
  Filled 2024-10-14: qty 44

## 2024-10-14 MED ORDER — SODIUM CHLORIDE 0.9% IV SOLUTION
Freq: Once | INTRAVENOUS | Status: DC
Start: 2024-10-14 — End: 2024-10-15

## 2024-10-14 NOTE — Progress Notes (Addendum)
 Progress Note    Brianna [name]  FMW:[id number] DOB: 09-Aug-1961  DOA: 10/12/2024 PCP: Cecily Katz, PA-C      Brief Narrative:    Medical records reviewed and are as summarized below:  Brianna [name] is a 63 y.o. female  with past medical history significant for incarcerated ventral hernia s/p ex lap on 09/13/2024, ESRD on hemodialysis on MWF schedule, chronic HFpEF, OSA on CPAP at night, asthma, anemia of chronic disease, chronic pain syndrome, GERD, gout, who was recently discharged from the nursing home to home on Friday, 10/09/2024.  Reportedly, there was a COVID outbreak at the nursing facility.  Since she got home, she developed a cough and shortness of breath that is worse with exertion.  She presented to the hospital because of worsening symptoms.   She tested positive for SARS-CoV-2.    CTA chest IMPRESSION: 1. No CT evidence for acute pulmonary embolus. 2. Collapse/consolidation in the right middle and lower lobes is similar to prior. Collapse/consolidation seen in the medial left base previously has nearly resolved. 3. Tiny hiatal hernia. 4.  Aortic Atherosclerosis (ICD10-I70.0).   She was admitted to the hospital for COVID-19 infection.      Assessment/Plan:   Principal Problem:   Acute COVID-19   Body mass index is 40.41 kg/m.  (Class III obesity)   Acute COVID-19 infection: She is tolerating room air.  Supportive care as needed.  She was only treated with dexamethasone  for 2 days. Shortness of breath has improved.  No documented hypoxia.   Acute diarrheal illness: This is likely due to COVID-19 infection.  Stool for C. difficile toxin and GI panel was negative. Imodium as needed for diarrhea.   General Weakness: Encouraged ambulation as tolerated.   Worsening anemia of chronic disease: Hemoglobin down from 7.9-7.3.  Hemoglobin was 8.7 on 09/23/2024.  Discussed risks, benefits and alternatives to blood transfusion.  Patient is  agreeable to blood transfusion.  Transfuse 1 unit of PRBCs because of easy fatigability and weakness.  Repeat CBC tomorrow   ESRD, secondary hyperparathyroidism: Follow-up with nephrologist for hemodialysis.   Type II DM with hyperglycemia: Use NovoLog  as needed for hyperglycemia.   Comorbidities include gout, history of asthma, chronic HFpEF, OSA on CPAP at night, chronic pain syndrome, anemia of chronic disease   Discussed disposition plans.  She does not feel ready to go home today.  Diet Order             Diet Carb Modified Fluid consistency: Thin; Room service appropriate? Yes; Fluid restriction: 1500 mL Fluid  Diet effective now                                  Consultants: Nephrologist  Procedures: None    Medications:    sodium chloride    Intravenous Once   amLODipine   5 mg Oral Daily   aspirin  EC  81 mg Oral Daily   Chlorhexidine  Gluconate Cloth  6 each Topical Q0600   heparin   5,000 Units Subcutaneous Q8H   insulin  aspart  0-5 Units Subcutaneous QHS   insulin  aspart  0-6 Units Subcutaneous TID WC   sertraline   50 mg Oral Daily   sevelamer  carbonate  1,600 mg Oral TID WC   sodium chloride  flush  3 mL Intravenous Q12H   Continuous Infusions:   Anti-infectives (From admission, onward)    None  Family Communication/Anticipated D/C date and plan/Code Status   DVT prophylaxis: heparin  injection 5,000 Units Start: 10/12/24 1400     Code Status: Full Code  Family Communication: None Disposition Plan: Plan to discharge home   Status is: Observation The patient will require care spanning > 2 midnights and should be moved to inpatient because: Diarrhea, general weakness       Subjective:   Interval events noted.  She still has watery diarrhea.  She said she had multiple loose stools overnight.  No vomiting or abdominal pain.  She complains of weakness and fatigue especially with minimal exertion.  Cough is  better.  Shortness of breath has improved.  Objective:    Vitals:   10/13/24 1948 10/14/24 0448 10/14/24 0500 10/14/24 0856  BP: 127/67 (!) 141/81  (!) 108/46  Pulse: 68 63  68  Resp: 18 20  17   Temp: 98.2 F (36.8 C) (!) 97.5 F (36.4 C)  98.2 F (36.8 C)  TempSrc:      SpO2: 100% 100%  100%  Weight:   84.7 kg   Height:       No data found.   Intake/Output Summary (Last 24 hours) at 10/14/2024 1253 Last data filed at 10/13/2024 2115 Gross per 24 hour  Intake 5 ml  Output --  Net 5 ml   Filed Weights   10/12/24 1541 10/12/24 1920 10/14/24 0500  Weight: 86.7 kg 84.7 kg 84.7 kg    Exam:  GEN: NAD SKIN: Warm and dry EYES: Pale but anicteric ENT: MMM CV: RRR PULM: CTA B ABD: soft, obese,, surgical incisions of healed nicely, NT, +BS CNS: AAO x 3, non focal EXT: No edema or tenderness       Data Reviewed:   I have personally reviewed following labs and imaging studies:  Labs: Labs show the following:   Basic Metabolic Panel: Recent Labs  Lab 10/12/24 0536 10/13/24 0538 10/14/24 0256  NA 136 136 136  K 3.2* 3.9 3.9  CL 98 96* 96*  CO2 24 29 26   GLUCOSE 103* 97 91  BUN 48* 29* 39*  CREATININE 6.65* 4.51* 5.21*  CALCIUM 9.1 9.4 9.4  MG  --   --  1.9  PHOS  --  3.7 4.2   GFR Estimated Creatinine Clearance: 10.1 mL/min (A) (by C-G formula based on SCr of 5.21 mg/dL (H)). Liver Function Tests: Recent Labs  Lab 10/13/24 0538 10/14/24 0256  ALBUMIN  2.8* 2.9*   No results for input(s): LIPASE, AMYLASE in the last 168 hours. No results for input(s): AMMONIA in the last 168 hours. Coagulation profile No results for input(s): INR, PROTIME in the last 168 hours.  CBC: Recent Labs  Lab 10/12/24 0536 10/14/24 0256  WBC 6.5 5.2  NEUTROABS 4.1  --   HGB 7.9* 7.3*  HCT 25.9* 23.4*  MCV 95.6 94.0  PLT 260 236   Cardiac Enzymes: No results for input(s): CKTOTAL, CKMB, CKMBINDEX, TROPONINI in the last 168 hours. BNP (last 3  results) No results for input(s): PROBNP in the last 8760 hours. CBG: Recent Labs  Lab 10/13/24 1216 10/13/24 1738 10/13/24 2105 10/14/24 0802 10/14/24 1158  GLUCAP 167* 169* 152* 82 122*   D-Dimer: Recent Labs    10/12/24 1952  DDIMER 2.62*   Hgb A1c: No results for input(s): HGBA1C in the last 72 hours. Lipid Profile: No results for input(s): CHOL, HDL, LDLCALC, TRIG, CHOLHDL, LDLDIRECT in the last 72 hours. Thyroid function studies: No results for input(s): TSH, T4TOTAL,  T3FREE, THYROIDAB in the last 72 hours.  Invalid input(s): FREET3 Anemia work up: No results for input(s): VITAMINB12, FOLATE, FERRITIN, TIBC, IRON, RETICCTPCT in the last 72 hours. Sepsis Labs: Recent Labs  Lab 10/12/24 0536 10/14/24 0256  WBC 6.5 5.2    Microbiology Recent Results (from the past 240 hours)  Resp panel by RT-PCR (RSV, Flu A&B, Covid) Anterior Nasal Swab     Status: Abnormal   Collection Time: 10/12/24  5:36 AM   Specimen: Anterior Nasal Swab  Result Value Ref Range Status   SARS Coronavirus 2 by RT PCR POSITIVE (A) NEGATIVE Final    Comment: (NOTE) SARS-CoV-2 target nucleic acids are DETECTED.  The SARS-CoV-2 RNA is generally detectable in upper respiratory specimens during the acute phase of infection. Positive results are indicative of the presence of the identified virus, but do not rule out bacterial infection or co-infection with other pathogens not detected by the test. Clinical correlation with patient history and other diagnostic information is necessary to determine patient infection status. The expected result is Negative.  Fact Sheet for Patients: BloggerCourse.com  Fact Sheet for Healthcare Providers: SeriousBroker.it  This test is not yet approved or cleared by the [place]  FDA and  has been authorized for detection and/or diagnosis of SARS-CoV-2 by FDA under an  Emergency Use Authorization (EUA).  This EUA will remain in effect (meaning this test can be used) for the duration of  the COVID-19 declaration under Section 564(b)(1) of the A ct, 21 U.S.C. section 360bbb-3(b)(1), unless the authorization is terminated or revoked sooner.     Influenza A by PCR NEGATIVE NEGATIVE Final   Influenza B by PCR NEGATIVE NEGATIVE Final    Comment: (NOTE) The Xpert Xpress SARS-CoV-2/FLU/RSV plus assay is intended as an aid in the diagnosis of influenza from Nasopharyngeal swab specimens and should not be used as a sole basis for treatment. Nasal washings and aspirates are unacceptable for Xpert Xpress SARS-CoV-2/FLU/RSV testing.  Fact Sheet for Patients: BloggerCourse.com  Fact Sheet for Healthcare Providers: SeriousBroker.it  This test is not yet approved or cleared by the [place]  FDA and has been authorized for detection and/or diagnosis of SARS-CoV-2 by FDA under an Emergency Use Authorization (EUA). This EUA will remain in effect (meaning this test can be used) for the duration of the COVID-19 declaration under Section 564(b)(1) of the Act, 21 U.S.C. section 360bbb-3(b)(1), unless the authorization is terminated or revoked.     Resp Syncytial Virus by PCR NEGATIVE NEGATIVE Final    Comment: (NOTE) Fact Sheet for Patients: BloggerCourse.com  Fact Sheet for Healthcare Providers: SeriousBroker.it  This test is not yet approved or cleared by the [place]  FDA and has been authorized for detection and/or diagnosis of SARS-CoV-2 by FDA under an Emergency Use Authorization (EUA). This EUA will remain in effect (meaning this test can be used) for the duration of the COVID-19 declaration under Section 564(b)(1) of the Act, 21 U.S.C. section 360bbb-3(b)(1), unless the authorization is terminated or revoked.  Performed at [facility],  [address] [place], [place] [address]   C Difficile Quick Screen w PCR reflex     Status: None   Collection Time: 10/13/24 10:00 AM   Specimen: STOOL  Result Value Ref Range Status   C Diff antigen NEGATIVE NEGATIVE Final   C Diff toxin NEGATIVE NEGATIVE Final   C Diff interpretation No C. difficile detected.  Final    Comment: Performed at [facility], [address] [place],  [place] [address]  Gastrointestinal Panel by PCR , Stool     Status: None   Collection Time: 10/13/24 10:00 AM   Specimen: Stool  Result Value Ref Range Status   Campylobacter species NOT DETECTED NOT DETECTED Final   Plesimonas shigelloides NOT DETECTED NOT DETECTED Final   Salmonella species NOT DETECTED NOT DETECTED Final   Yersinia enterocolitica NOT DETECTED NOT DETECTED Final   Vibrio species NOT DETECTED NOT DETECTED Final   Vibrio cholerae NOT DETECTED NOT DETECTED Final   Enteroaggregative E coli (EAEC) NOT DETECTED NOT DETECTED Final   Enteropathogenic E coli (EPEC) NOT DETECTED NOT DETECTED Final   Enterotoxigenic E coli (ETEC) NOT DETECTED NOT DETECTED Final   Shiga like toxin producing E coli (STEC) NOT DETECTED NOT DETECTED Final   Shigella/Enteroinvasive E coli (EIEC) NOT DETECTED NOT DETECTED Final   Cryptosporidium NOT DETECTED NOT DETECTED Final   Cyclospora cayetanensis NOT DETECTED NOT DETECTED Final   Entamoeba histolytica NOT DETECTED NOT DETECTED Final   Giardia lamblia NOT DETECTED NOT DETECTED Final   Adenovirus F40/41 NOT DETECTED NOT DETECTED Final   Astrovirus NOT DETECTED NOT DETECTED Final   Norovirus GI/GII NOT DETECTED NOT DETECTED Final   Rotavirus A NOT DETECTED NOT DETECTED Final   Sapovirus (I, II, IV, and V) NOT DETECTED NOT DETECTED Final    Comment: Performed at [facility], [address] [place], [place] [address]    Procedures and diagnostic studies:  No results found.              LOS: 0 days   Emerlyn Mehlhoff  [facility]   Pager on [email or web address]. If 7PM-7AM, please contact night-coverage at [email or web address]     10/14/2024, 12:53 PM

## 2024-10-14 NOTE — Progress Notes (Signed)
 Started the processof getting notary and witnesses for ACP. Reviewd document with patient and was able to get task completed. Sat with patient as dialysis was being done and learn so much of her life before illness.   Kearstin Learn Chaplain Intern

## 2024-10-14 NOTE — Care Management Obs Status (Signed)
 MEDICARE OBSERVATION STATUS NOTIFICATION   Patient Details  Name: Brianna [name] MRN: [id number] Date of Birth: 1961-12-10   Medicare Observation Status Notification Given:  Yes    Lyne Khurana W, CMA 10/14/2024, 10:08 AM

## 2024-10-14 NOTE — Progress Notes (Signed)
 [facility]  ROUNDING NOTE   Subjective:   Brianna [name] is a 63 year old female with past medical conditions including obstructive sleep apnea on CPAP, gout, GERD, type 2 diabetes, and end-stage renal disease on hemodialysis. Patient presents to ED with shortness of breath and has been admitted for DOE (dyspnea on exertion) [R06.09] Exacerbation of intermittent asthma, unspecified asthma severity [J45.21] Acute COVID-19 [U07.1] COVID-19 [U07.1]  Patient is known to our practice and receives outpatient dialysis treatments on MWF at [facility], supervised by Dr marcelino.   Update Patient seen sitting up in chair Mild tachypnea noted during visit Currently eating bowl of cereal Scheduled for dialysis later this afternoon   Objective:  Vital signs in last 24 hours:  Temp:  [97.5 F (36.4 C)-98.2 F (36.8 C)] 98.2 F (36.8 C) (10/15 0856) Pulse Rate:  [63-73] 68 (10/15 0856) Resp:  [17-20] 17 (10/15 0856) BP: (108-141)/(46-81) 108/46 (10/15 0856) SpO2:  [100 %] 100 % (10/15 0856) Weight:  [84.7 kg] 84.7 kg (10/15 0500)  Weight change: -2 kg Filed Weights   10/12/24 1541 10/12/24 1920 10/14/24 0500  Weight: 86.7 kg 84.7 kg 84.7 kg    Intake/Output: I/O last 3 completed shifts: In: 5 [I.V.:5] Out: 2000 [Other:2000]   Intake/Output this shift:  No intake/output data recorded.  Physical Exam: General: NAD  Head: Normocephalic  Eyes: Anicteric  Lungs:  Clear, [place] O2  Heart: Regular rate  Abdomen:  Soft  Extremities: Trace peripheral edema.  Neurologic: Alert, oriented  Skin: No rashes  Access: Rt chest permcath, Left AVF maturing    Basic Metabolic Panel: Recent Labs  Lab 10/12/24 0536 10/13/24 0538 10/14/24 0256  NA 136 136 136  K 3.2* 3.9 3.9  CL 98 96* 96*  CO2 24 29 26   GLUCOSE 103* 97 91  BUN 48* 29* 39*  CREATININE 6.65* 4.51* 5.21*  CALCIUM 9.1 9.4 9.4  MG  --   --  1.9  PHOS  --  3.7 4.2    Liver Function  Tests: Recent Labs  Lab 10/13/24 0538 10/14/24 0256  ALBUMIN  2.8* 2.9*    No results for input(s): LIPASE, AMYLASE in the last 168 hours. No results for input(s): AMMONIA in the last 168 hours.  CBC: Recent Labs  Lab 10/12/24 0536 10/14/24 0256  WBC 6.5 5.2  NEUTROABS 4.1  --   HGB 7.9* 7.3*  HCT 25.9* 23.4*  MCV 95.6 94.0  PLT 260 236    Cardiac Enzymes: No results for input(s): CKTOTAL, CKMB, CKMBINDEX, TROPONINI in the last 168 hours.  BNP: Invalid input(s): POCBNP  CBG: Recent Labs  Lab 10/13/24 0819 10/13/24 1216 10/13/24 1738 10/13/24 2105 10/14/24 0802  GLUCAP 90 167* 169* 152* 82     Microbiology: Results for orders placed or performed during the hospital encounter of 10/12/24  Resp panel by RT-PCR (RSV, Flu A&B, Covid) Anterior Nasal Swab     Status: Abnormal   Collection Time: 10/12/24  5:36 AM   Specimen: Anterior Nasal Swab  Result Value Ref Range Status   SARS Coronavirus 2 by RT PCR POSITIVE (A) NEGATIVE Final    Comment: (NOTE) SARS-CoV-2 target nucleic acids are DETECTED.  The SARS-CoV-2 RNA is generally detectable in upper respiratory specimens during the acute phase of infection. Positive results are indicative of the presence of the identified virus, but do not rule out bacterial infection or co-infection with other pathogens not detected by the test. Clinical correlation with patient history and other diagnostic information is  necessary to determine patient infection status. The expected result is Negative.  Fact Sheet for Patients: BloggerCourse.com  Fact Sheet for Healthcare Providers: SeriousBroker.it  This test is not yet approved or cleared by the [place]  FDA and  has been authorized for detection and/or diagnosis of SARS-CoV-2 by FDA under an Emergency Use Authorization (EUA).  This EUA will remain in effect (meaning this test can be used) for the  duration of  the COVID-19 declaration under Section 564(b)(1) of the A ct, 21 U.S.C. section 360bbb-3(b)(1), unless the authorization is terminated or revoked sooner.     Influenza A by PCR NEGATIVE NEGATIVE Final   Influenza B by PCR NEGATIVE NEGATIVE Final    Comment: (NOTE) The Xpert Xpress SARS-CoV-2/FLU/RSV plus assay is intended as an aid in the diagnosis of influenza from Nasopharyngeal swab specimens and should not be used as a sole basis for treatment. Nasal washings and aspirates are unacceptable for Xpert Xpress SARS-CoV-2/FLU/RSV testing.  Fact Sheet for Patients: BloggerCourse.com  Fact Sheet for Healthcare Providers: SeriousBroker.it  This test is not yet approved or cleared by the [place]  FDA and has been authorized for detection and/or diagnosis of SARS-CoV-2 by FDA under an Emergency Use Authorization (EUA). This EUA will remain in effect (meaning this test can be used) for the duration of the COVID-19 declaration under Section 564(b)(1) of the Act, 21 U.S.C. section 360bbb-3(b)(1), unless the authorization is terminated or revoked.     Resp Syncytial Virus by PCR NEGATIVE NEGATIVE Final    Comment: (NOTE) Fact Sheet for Patients: BloggerCourse.com  Fact Sheet for Healthcare Providers: SeriousBroker.it  This test is not yet approved or cleared by the [place]  FDA and has been authorized for detection and/or diagnosis of SARS-CoV-2 by FDA under an Emergency Use Authorization (EUA). This EUA will remain in effect (meaning this test can be used) for the duration of the COVID-19 declaration under Section 564(b)(1) of the Act, 21 U.S.C. section 360bbb-3(b)(1), unless the authorization is terminated or revoked.  Performed at [facility], [address] [place], [place] [address]   C Difficile Quick Screen w PCR reflex     Status: None    Collection Time: 10/13/24 10:00 AM   Specimen: STOOL  Result Value Ref Range Status   C Diff antigen NEGATIVE NEGATIVE Final   C Diff toxin NEGATIVE NEGATIVE Final   C Diff interpretation No C. difficile detected.  Final    Comment: Performed at [facility], [address] [place], [place] [address]  Gastrointestinal Panel by PCR , Stool     Status: None   Collection Time: 10/13/24 10:00 AM   Specimen: Stool  Result Value Ref Range Status   Campylobacter species NOT DETECTED NOT DETECTED Final   Plesimonas shigelloides NOT DETECTED NOT DETECTED Final   Salmonella species NOT DETECTED NOT DETECTED Final   Yersinia enterocolitica NOT DETECTED NOT DETECTED Final   Vibrio species NOT DETECTED NOT DETECTED Final   Vibrio cholerae NOT DETECTED NOT DETECTED Final   Enteroaggregative E coli (EAEC) NOT DETECTED NOT DETECTED Final   Enteropathogenic E coli (EPEC) NOT DETECTED NOT DETECTED Final   Enterotoxigenic E coli (ETEC) NOT DETECTED NOT DETECTED Final   Shiga like toxin producing E coli (STEC) NOT DETECTED NOT DETECTED Final   Shigella/Enteroinvasive E coli (EIEC) NOT DETECTED NOT DETECTED Final   Cryptosporidium NOT DETECTED NOT DETECTED Final   Cyclospora cayetanensis NOT DETECTED NOT DETECTED Final   Entamoeba histolytica NOT DETECTED NOT DETECTED Final  Giardia lamblia NOT DETECTED NOT DETECTED Final   Adenovirus F40/41 NOT DETECTED NOT DETECTED Final   Astrovirus NOT DETECTED NOT DETECTED Final   Norovirus GI/GII NOT DETECTED NOT DETECTED Final   Rotavirus A NOT DETECTED NOT DETECTED Final   Sapovirus (I, II, IV, and V) NOT DETECTED NOT DETECTED Final    Comment: Performed at [facility], [address] [place], [place] [address]    Coagulation Studies: No results for input(s): LABPROT, INR in the last 72 hours.  Urinalysis: No results for input(s): COLORURINE, LABSPEC, PHURINE, GLUCOSEU, HGBUR, BILIRUBINUR, KETONESUR, PROTEINUR,  UROBILINOGEN, NITRITE, LEUKOCYTESUR in the last 72 hours.  Invalid input(s): APPERANCEUR    Imaging: No results found.    Medications:     sodium chloride    Intravenous Once   amLODipine   5 mg Oral Daily   aspirin  EC  81 mg Oral Daily   Chlorhexidine  Gluconate Cloth  6 each Topical Q0600   heparin   5,000 Units Subcutaneous Q8H   insulin  aspart  0-5 Units Subcutaneous QHS   insulin  aspart  0-6 Units Subcutaneous TID WC   sertraline   50 mg Oral Daily   sevelamer  carbonate  1,600 mg Oral TID WC   sodium chloride  flush  3 mL Intravenous Q12H   acetaminophen  **OR** acetaminophen , loperamide, midodrine , ondansetron  **OR** ondansetron  (ZOFRAN ) IV, oxyCODONE   Assessment/ Plan:  [name] is a 63 y.o.  female  with past medical conditions including obstructive sleep apnea on CPAP, gout, GERD, type 2 diabetes, and end-stage renal disease on hemodialysis.  Patient is currently admitted for DOE (dyspnea on exertion) [R06.09] Exacerbation of intermittent asthma, unspecified asthma severity [J45.21] Acute COVID-19 [U07.1] COVID-19 [U07.1]   [facility]/MWF/right IJ PermCath/left upper AVF.    End-stage renal disease on hemodialysis.  Continue MWF schedule.  Patient scheduled to receive dialysis later today, at bedside due to COVID-19 status.  Next treatment scheduled for Friday.    2. Anemia of chronic kidney disease Hemoglobin & Hematocrit     Component Value Date/Time   HGB 7.3 (L) 10/14/2024 0256   HGB 14.6 04/22/2013 1549   HCT 23.4 (L) 10/14/2024 0256   HCT 44.0 04/22/2013 1549   Hgb borderline. Will order Retacrit  10000 units IV with dialysis today.  Primary team has ordered 1 unit blood transfusion with dialysis as well.   3.  Secondary Hyperparathyroidism: with outpatient labs: PTH 993, phosphorus 4.9, calcium 9.5 on 09/07/24.   Calcium and phos stable  4. Diabetes mellitus type II with chronic kidney disease/renal manifestations:  noninsulin dependent. Home regimen includes Ozempic. Most recent hemoglobin A1c is 5.6 on 09/13/24.    Primary team to continue management of SSI     LOS: 0 Brianna [name] 10/15/202511:33 AM

## 2024-10-14 NOTE — Progress Notes (Signed)
  Received patient in bed to unit.   Informed consent signed and in chart.    TX duration:3hrs  1 unit blood transfused during tx. Tolerated well    pt dialyzed bedside d/t COVID + Hand-off given to patient's nurse. No acute distress noted    Access used: R HD Catheter  Access issues: none   Total UF removed: 2.0L Medication(s) given: none Post HD VS: wnl      Olivia Hurst LPN [facility]

## 2024-10-14 NOTE — Plan of Care (Addendum)
   Problem: Education: Goal: Ability to describe self-care measures that may prevent or decrease complications (Diabetes Survival Skills Education) will improve Outcome: Progressing   Problem: Coping: Goal: Ability to adjust to condition or change in health will improve Outcome: Progressing   Problem: Fluid Volume: Goal: Ability to maintain a balanced intake and output will improve Outcome: Progressing   Problem: Health Behavior/Discharge Planning: Goal: Ability to identify and utilize available resources and services will improve Outcome: Progressing Goal: Ability to manage health-related needs will improve Outcome: Progressing   Problem: Metabolic: Goal: Ability to maintain appropriate glucose levels will improve Outcome: Progressing   Problem: Nutritional: Goal: Maintenance of adequate nutrition will improve Outcome: Progressing Goal: Progress toward achieving an optimal weight will improve Outcome: Progressing   Problem: Skin Integrity: Goal: Risk for impaired skin integrity will decrease Outcome: Progressing   Problem: Tissue Perfusion: Goal: Adequacy of tissue perfusion will improve Outcome: Progressing   Problem: Education: Goal: Knowledge of General Education information will improve Description: Including pain rating scale, medication(s)/side effects and non-pharmacologic comfort measures Outcome: Progressing   Problem: Health Behavior/Discharge Planning: Goal: Ability to manage health-related needs will improve Outcome: Progressing

## 2024-10-15 DIAGNOSIS — U071 COVID-19: Secondary | ICD-10-CM | POA: Diagnosis not present

## 2024-10-15 DIAGNOSIS — R531 Weakness: Secondary | ICD-10-CM

## 2024-10-15 DIAGNOSIS — J4521 Mild intermittent asthma with (acute) exacerbation: Secondary | ICD-10-CM

## 2024-10-15 DIAGNOSIS — R0609 Other forms of dyspnea: Secondary | ICD-10-CM | POA: Diagnosis not present

## 2024-10-15 LAB — CBC
HCT: 26.9 % — ABNORMAL LOW (ref 36.0–46.0)
Hemoglobin: 8.5 g/dL — ABNORMAL LOW (ref 12.0–15.0)
MCH: 29.9 pg (ref 26.0–34.0)
MCHC: 31.6 g/dL (ref 30.0–36.0)
MCV: 94.7 fL (ref 80.0–100.0)
Platelets: 213 K/uL (ref 150–400)
RBC: 2.84 MIL/uL — ABNORMAL LOW (ref 3.87–5.11)
RDW: 15.7 % — ABNORMAL HIGH (ref 11.5–15.5)
WBC: 6.3 K/uL (ref 4.0–10.5)
nRBC: 0 % (ref 0.0–0.2)

## 2024-10-15 LAB — TYPE AND SCREEN
ABO/RH(D): O POS
Antibody Screen: NEGATIVE
Unit division: 0

## 2024-10-15 LAB — BASIC METABOLIC PANEL WITH GFR
Anion gap: 10 (ref 5–15)
BUN: 25 mg/dL — ABNORMAL HIGH (ref 8–23)
CO2: 26 mmol/L (ref 22–32)
Calcium: 8.8 mg/dL — ABNORMAL LOW (ref 8.9–10.3)
Chloride: 95 mmol/L — ABNORMAL LOW (ref 98–111)
Creatinine, Ser: 3.58 mg/dL — ABNORMAL HIGH (ref 0.44–1.00)
GFR, Estimated: 14 mL/min — ABNORMAL LOW (ref >60)
Glucose, Bld: 83 mg/dL (ref 70–99)
Potassium: 3.9 mmol/L (ref 3.5–5.1)
Sodium: 131 mmol/L — ABNORMAL LOW (ref 135–145)

## 2024-10-15 LAB — BPAM RBC
Blood Product Expiration Date: 202511172359
ISSUE DATE / TIME: 202510151537
Unit Type and Rh: 5100

## 2024-10-15 LAB — GLUCOSE, CAPILLARY
Glucose-Capillary: 95 mg/dL (ref 70–99)
Glucose-Capillary: 102 mg/dL — ABNORMAL HIGH (ref 70–99)

## 2024-10-15 MED ORDER — ORAL CARE MOUTH RINSE: 15.0000 mL | OROMUCOSAL | Status: DC | PRN

## 2024-10-15 MED ORDER — EPOETIN ALFA-EPBX 10000 UNIT/ML IJ SOLN: 10000.0000 [IU] | INTRAMUSCULAR | Status: DC

## 2024-10-15 MED ORDER — MIDODRINE HCL 10 MG PO TABS: 10.0000 mg | ORAL_TABLET | ORAL | 0 refills | Status: AC | PRN

## 2024-10-15 NOTE — Discharge Summary (Signed)
 Physician Discharge Summary   Patient: Brianna [name] MRN: [id number] DOB: Mar 03, 1961  Admit date:     10/12/2024  Discharge date: 10/15/24  Discharge Physician: Amaryllis Dare   PCP: Cecily Katz, PA-C   Recommendations at discharge:  Please obtain CBC and BMP on follow-up Patient need to continue with scheduled dialysis Follow-up with primary care provider within a week  Discharge Diagnoses: Principal Problem:   Acute COVID-19 Active Problems:   DOE (dyspnea on exertion)   Exacerbation of intermittent asthma   General weakness   Hospital Course: Brianna [name] is a 63 y.o. female  with past medical history significant for incarcerated ventral hernia s/p ex lap on 09/13/2024, ESRD on hemodialysis on MWF schedule, chronic HFpEF, OSA on CPAP at night, asthma, anemia of chronic disease, chronic pain syndrome, GERD, gout, who was recently discharged from the nursing home to home on Friday, 10/09/2024.  Reportedly, there was a COVID outbreak at the nursing facility.  Since she got home, she developed a cough and shortness of breath that is worse with exertion.  She presented to the hospital because of worsening symptoms.   She tested positive for SARS-CoV-2.    CTA chest was negative for any PE, did show stable collapse/consolidation in right middle and lower lobe and improved consolidation in the medial left base.  Patient was also having some diarrhea which was likely due to COVID-19 infection as stool for C. difficile toxin and GI pathogen panel was negative.  Diarrhea resolved.  Patient received 2 days of dexamethasone , no documented hypoxia so steroids were not continued.  Patient has an history of anemia of chronic kidney disease, worsening hemoglobin which can be contributory to exertional dyspnea.  Received 1 unit of PRBC, and Epogen  during dialysis.  Hemoglobin of 8.9 on the day of discharge.  Patient did get some oxygen therapy for comfort reason as there was no  documented hypoxia.  She was ambulated and did not qualify for any oxygen.  She was evaluated by PT and they recommended home health services which was ordered.  Patient will continue with her home medications.  Also started on midodrine  during dialysis.  Patient need to follow-up with her providers closely for further assistance.   Consultants: Nephrology Procedures performed: Hemodialysis Disposition: Home health Diet recommendation:  Discharge Diet Orders (From admission, onward)     Start     Ordered   10/15/24 0000  Diet - low sodium heart healthy        10/15/24 1209           Renal diet DISCHARGE MEDICATION: Allergies as of 10/15/2024       Reactions   Ace Inhibitors Anaphylaxis   Lisinopril Anaphylaxis   Lipitor [atorvastatin] Other (See Comments)   Muscle aches        Medication List     TAKE these medications    albuterol  108 (90 Base) MCG/ACT inhaler Commonly known as: VENTOLIN  HFA Inhale 1-2 puffs into the lungs every 6 (six) hours as needed for wheezing or shortness of breath.   albuterol  (2.5 MG/3ML) 0.083% nebulizer solution Commonly known as: PROVENTIL  Take 2.5 mg by nebulization every 4 (four) hours as needed for shortness of breath or wheezing.   allopurinol  300 MG tablet Commonly known as: ZYLOPRIM  Take 300 mg by mouth every morning.   amLODipine  5 MG tablet Commonly known as: NORVASC  Take 5 mg by mouth daily.   Aspirin  Adult Low Strength 81 MG tablet Generic drug: aspirin  EC Take 81  mg by mouth daily.   carvedilol  12.5 MG tablet Commonly known as: COREG  Take 1 tablet (12.5 mg total) by mouth 2 (two) times daily before a meal.   cetirizine 10 MG tablet Commonly known as: ZYRTEC Take 10 mg by mouth every morning.   cyanocobalamin  1000 MCG tablet Commonly known as: VITAMIN B12 Take 1 tablet (1,000 mcg total) by mouth daily.   FeroSul 325 (65 Fe) MG tablet Generic drug: ferrous sulfate Take 325 mg by mouth every other day.    furosemide  80 MG tablet Commonly known as: LASIX  Take 1 tablet (80 mg total) by mouth daily. For leg swelling or weight gain >5lbs   lactulose  10 GM/15ML solution Commonly known as: CHRONULAC  Take 10 g by mouth daily as needed for mild constipation.   midodrine  10 MG tablet Commonly known as: PROAMATINE  Take 1 tablet (10 mg total) by mouth every dialysis (hypotension).   omeprazole 20 MG capsule Commonly known as: PRILOSEC Take 20 mg by mouth every morning.   Oxycodone  HCl 10 MG Tabs Take 1 tablet (10 mg total) by mouth every 4 (four) hours as needed for severe pain (pain score 7-10).   Ozempic (1 MG/DOSE) 4 MG/3ML Sopn Generic drug: Semaglutide (1 MG/DOSE) Inject 1 mg into the skin once a week.   sertraline  25 MG tablet Commonly known as: ZOLOFT  Take 25 mg by mouth daily.   sevelamer  carbonate 800 MG tablet Commonly known as: RENVELA  Take 1,600 mg by mouth 3 (three) times daily.   traZODone 50 MG tablet Commonly known as: DESYREL Take 50 mg by mouth at bedtime.   triamcinolone ointment 0.5 % Commonly known as: KENALOG Apply 1 Application topically 2 (two) times daily as needed.   Vitamin D  (Ergocalciferol ) 1.25 MG (50000 UNIT) Caps capsule Commonly known as: DRISDOL Take 50,000 Units by mouth once a week.   Zetia  10 MG tablet Generic drug: ezetimibe  Take 10 mg by mouth every morning.   zolpidem  5 MG tablet Commonly known as: AMBIEN  Take 1 tablet (5 mg total) by mouth at bedtime as needed for sleep.               Durable Medical Equipment  (From admission, onward)           Start     Ordered   10/15/24 1205  DME 3-in-1  Once        10/15/24 1209            Follow-up Information     Cecily Katz, PA-C. Schedule an appointment as soon as possible for a visit in 1 week(s).   Specialty: Physician Assistant Contact information: [address] [place] [address] [phone]                Discharge Exam: Brianna  [name]   10/12/24 1920 10/14/24 0500 10/15/24 0500  Weight: 84.7 kg 84.7 kg 86.6 kg   General.  Morbidly obese lady, in no acute distress. Pulmonary.  Lungs clear bilaterally, normal respiratory effort. CV.  Regular rate and rhythm, no JVD, rub or murmur. Abdomen.  Soft, nontender, nondistended, BS positive. CNS.  Alert and oriented .  No focal neurologic deficit. Extremities.  No edema, no cyanosis, pulses intact and symmetrical. Psychiatry.  Judgment and insight appears normal.   Condition at discharge: stable  The results of significant diagnostics from this hospitalization (including imaging, microbiology, ancillary and laboratory) are listed below for reference.   Imaging Studies: CT Angio Chest PE W and/or Wo Contrast  Result Date: 10/12/2024 CLINICAL DATA:  Short of breath. Clinical concern for pulmonary embolus. EXAM: CT ANGIOGRAPHY CHEST WITH CONTRAST TECHNIQUE: Multidetector CT imaging of the chest was performed using the standard protocol during bolus administration of intravenous contrast. Multiplanar CT image reconstructions and MIPs were obtained to evaluate the vascular anatomy. RADIATION DOSE REDUCTION: This exam was performed according to the departmental dose-optimization program which includes automated exposure control, adjustment of the mA and/or kV according to patient size and/or use of iterative reconstruction technique. CONTRAST:  75mL OMNIPAQUE  IOHEXOL  350 MG/ML SOLN COMPARISON:  09/15/2024 FINDINGS: Cardiovascular: The heart is enlarged. Trace pericardial effusion evident. Coronary artery calcification is evident. Mild atherosclerotic calcification is noted in the wall of the thoracic aorta. Right-sided central line tip is positioned in the right atrium. There is no filling defect within the opacified pulmonary arteries to suggest the presence of an acute pulmonary embolus. Mediastinum/Nodes: No mediastinal lymphadenopathy. There is no hilar lymphadenopathy. Tiny hiatal  hernia. The esophagus has normal imaging features. There is no axillary lymphadenopathy. Lungs/Pleura: Collapse/consolidation in the right middle and lower lobes is similar to prior. Collapse/consolidation seen in the medial left base previously has nearly resolved. No overt pulmonary edema. No pleural effusion. Upper Abdomen: No acute findings Musculoskeletal: No worrisome lytic or sclerotic osseous abnormality. Review of the MIP images confirms the above findings. IMPRESSION: 1. No CT evidence for acute pulmonary embolus. 2. Collapse/consolidation in the right middle and lower lobes is similar to prior. Collapse/consolidation seen in the medial left base previously has nearly resolved. 3. Tiny hiatal hernia. 4.  Aortic Atherosclerosis (ICD10-I70.0). Electronically Signed   By: Camellia Candle M.D.   On: 10/12/2024 08:18   DG Chest Portable 1 View Result Date: 10/12/2024 EXAM: 1 VIEW XRAY OF THE CHEST 10/12/2024 06:10:11 AM COMPARISON: CTA chest 09/15/2024 and earlier. CLINICAL HISTORY: 63 year old female. SOB. FINDINGS: LINES, TUBES AND DEVICES: Stable right chest dual lumen vascular catheter. LUNGS AND PLEURA: Ongoing low lung volumes, although mildly improved. Small volume of fluid or atelectasis along the right minor fissure has increased. No other pleural fluid is visible. No pneumothorax. No focal pulmonary opacity. No pulmonary edema. HEART AND MEDIASTINUM: Calcified aortic atherosclerosis. No acute abnormality of the cardiac silhouette. BONES AND SOFT TISSUES: No acute osseous abnormality. ABDOMEN: Paucity of bowel gas. IMPRESSION: 1. Low lung volumes with increased fluid or atelectasis along the right minor fissure. 2. No other acute cardiopulmonary abnormality. Electronically signed by: Helayne Hurst MD 10/12/2024 06:59 AM EDT RP Workstation: [id number]   DG Abd 1 View Result Date: 09/17/2024 CLINICAL DATA:  63 year old female with incarcerated ventral hernia, obstruction. Status post exploratory  laparotomy, reduced hernia, repair of hernia, lysis of adhesions, omentectomy. Postoperative day 4. Ileus. EXAM: ABDOMEN - 1 VIEW COMPARISON:  Postoperative CT Abdomen and Pelvis 09/15/2024. FINDINGS: Portable AP supine views at 0829 hours. Stable enteric tube terminating in the proximal stomach. Bulky and calcified midline uterine fibroid redemonstrated in the pelvis. Abdominal skin staples remain in place. Bowel-gas pattern has improved since the CT scout view 09/15/2024, appears nonobstructed now. Stable lung bases. Aortoiliac calcified atherosclerosis. Stable visualized osseous structures. IMPRESSION: 1. Improved bowel-gas pattern since 09/15/2024, non-obstructed now. 2. Stable enteric tube terminating in the proximal stomach. 3. Bulky calcified midline uterine fibroid. Electronically Signed   By: VEAR Hurst M.D.   On: 09/17/2024 08:59   CT ABDOMEN PELVIS W CONTRAST Result Date: 09/15/2024 CLINICAL DATA:  Status post reduction and repair large incarcerated and obstructed ventral hernia on 09/13/2024. Postoperative tachycardia,  hypotension and abdominal pain. EXAM: CT ABDOMEN AND PELVIS WITH CONTRAST TECHNIQUE: Multidetector CT imaging of the abdomen and pelvis was performed using the standard protocol following bolus administration of intravenous contrast. RADIATION DOSE REDUCTION: This exam was performed according to the departmental dose-optimization program which includes automated exposure control, adjustment of the mA and/or kV according to patient size and/or use of iterative reconstruction technique. CONTRAST:  OMNIPAQUE  IOHEXOL  300 MG/ML  SOLN COMPARISON:  09/12/2024 FINDINGS: Lower chest: Bilateral lower lobe atelectasis. Hepatobiliary: Normal appearance of liver. No masses or biliary dilatation. The gallbladder contains some vicariously excreted contrast. Pancreas: Unremarkable. No pancreatic ductal dilatation or surrounding inflammatory changes. Spleen: Normal in size without focal abnormality.  Adrenals/Urinary Tract: Stable hyperplastic appearance of both adrenal glands without focal mass. Both kidneys have a normal appearance without hydronephrosis, calculi or lesion. The bladder is completely decompressed. Stomach/Bowel: Nasogastric tube extends to the mid stomach. Dilated proximal small bowel measuring up to 5 cm in maximum caliber. The dilated jejunum is hyperemic and fluid-filled. There does appear to be transition from dilated to nondilated small bowel somewhere in the anterior pelvis. Adhesion cannot be excluded. The colon is decompressed. There is no evidence of recurrent hernia after recent surgical repair with expected small amount of seroma fluid and air within the previous large ventral hernia sac in pannicular fat. No free intraperitoneal air or focal abscess identified. Vascular/Lymphatic: Stable aortoiliac atherosclerosis without aneurysm. No lymphadenopathy identified. Reproductive: Stable uterine enlargement with a large dominant heavily calcified degenerated fibroid. Other: No ascites. Musculoskeletal: No acute or significant osseous findings. IMPRESSION: 1. Dilated proximal small bowel measuring up to 5 cm in maximum caliber. The dilated jejunum is hyperemic and fluid-filled. There does appear to be transition from dilated to nondilated small bowel somewhere in the anterior pelvis. Small bowel obstruction due to adhesion cannot be excluded. 2. No evidence of recurrent hernia after recent surgical repair with expected small amount of seroma fluid and air within the previous large ventral hernia sac in pannicular fat. 3. Stable uterine enlargement with a large dominant heavily calcified degenerated fibroid. 4. Stable hyperplastic appearance of both adrenal glands without focal mass. Electronically Signed   By: Marcey Moan M.D.   On: 09/15/2024 15:50    Microbiology: Results for orders placed or performed during the hospital encounter of 10/12/24  Resp panel by RT-PCR (RSV, Flu  A&B, Covid) Anterior Nasal Swab     Status: Abnormal   Collection Time: 10/12/24  5:36 AM   Specimen: Anterior Nasal Swab  Result Value Ref Range Status   SARS Coronavirus 2 by RT PCR POSITIVE (A) NEGATIVE Final    Comment: (NOTE) SARS-CoV-2 target nucleic acids are DETECTED.  The SARS-CoV-2 RNA is generally detectable in upper respiratory specimens during the acute phase of infection. Positive results are indicative of the presence of the identified virus, but do not rule out bacterial infection or co-infection with other pathogens not detected by the test. Clinical correlation with patient history and other diagnostic information is necessary to determine patient infection status. The expected result is Negative.  Fact Sheet for Patients: BloggerCourse.com  Fact Sheet for Healthcare Providers: SeriousBroker.it  This test is not yet approved or cleared by the [place]  FDA and  has been authorized for detection and/or diagnosis of SARS-CoV-2 by FDA under an Emergency Use Authorization (EUA).  This EUA will remain in effect (meaning this test can be used) for the duration of  the COVID-19 declaration under Section 564(b)(1) of the A ct,  21 U.S.C. section 360bbb-3(b)(1), unless the authorization is terminated or revoked sooner.     Influenza A by PCR NEGATIVE NEGATIVE Final   Influenza B by PCR NEGATIVE NEGATIVE Final    Comment: (NOTE) The Xpert Xpress SARS-CoV-2/FLU/RSV plus assay is intended as an aid in the diagnosis of influenza from Nasopharyngeal swab specimens and should not be used as a sole basis for treatment. Nasal washings and aspirates are unacceptable for Xpert Xpress SARS-CoV-2/FLU/RSV testing.  Fact Sheet for Patients: BloggerCourse.com  Fact Sheet for Healthcare Providers: SeriousBroker.it  This test is not yet approved or cleared by the [place]   FDA and has been authorized for detection and/or diagnosis of SARS-CoV-2 by FDA under an Emergency Use Authorization (EUA). This EUA will remain in effect (meaning this test can be used) for the duration of the COVID-19 declaration under Section 564(b)(1) of the Act, 21 U.S.C. section 360bbb-3(b)(1), unless the authorization is terminated or revoked.     Resp Syncytial Virus by PCR NEGATIVE NEGATIVE Final    Comment: (NOTE) Fact Sheet for Patients: BloggerCourse.com  Fact Sheet for Healthcare Providers: SeriousBroker.it  This test is not yet approved or cleared by the [place]  FDA and has been authorized for detection and/or diagnosis of SARS-CoV-2 by FDA under an Emergency Use Authorization (EUA). This EUA will remain in effect (meaning this test can be used) for the duration of the COVID-19 declaration under Section 564(b)(1) of the Act, 21 U.S.C. section 360bbb-3(b)(1), unless the authorization is terminated or revoked.  Performed at [facility], [address] [place], [place] [address]   C Difficile Quick Screen w PCR reflex     Status: None   Collection Time: 10/13/24 10:00 AM   Specimen: STOOL  Result Value Ref Range Status   C Diff antigen NEGATIVE NEGATIVE Final   C Diff toxin NEGATIVE NEGATIVE Final   C Diff interpretation No C. difficile detected.  Final    Comment: Performed at [facility], [address] [place], [place] [address]  Gastrointestinal Panel by PCR , Stool     Status: None   Collection Time: 10/13/24 10:00 AM   Specimen: Stool  Result Value Ref Range Status   Campylobacter species NOT DETECTED NOT DETECTED Final   Plesimonas shigelloides NOT DETECTED NOT DETECTED Final   Salmonella species NOT DETECTED NOT DETECTED Final   Yersinia enterocolitica NOT DETECTED NOT DETECTED Final   Vibrio species NOT DETECTED NOT DETECTED Final   Vibrio cholerae NOT DETECTED NOT DETECTED  Final   Enteroaggregative E coli (EAEC) NOT DETECTED NOT DETECTED Final   Enteropathogenic E coli (EPEC) NOT DETECTED NOT DETECTED Final   Enterotoxigenic E coli (ETEC) NOT DETECTED NOT DETECTED Final   Shiga like toxin producing E coli (STEC) NOT DETECTED NOT DETECTED Final   Shigella/Enteroinvasive E coli (EIEC) NOT DETECTED NOT DETECTED Final   Cryptosporidium NOT DETECTED NOT DETECTED Final   Cyclospora cayetanensis NOT DETECTED NOT DETECTED Final   Entamoeba histolytica NOT DETECTED NOT DETECTED Final   Giardia lamblia NOT DETECTED NOT DETECTED Final   Adenovirus F40/41 NOT DETECTED NOT DETECTED Final   Astrovirus NOT DETECTED NOT DETECTED Final   Norovirus GI/GII NOT DETECTED NOT DETECTED Final   Rotavirus A NOT DETECTED NOT DETECTED Final   Sapovirus (I, II, IV, and V) NOT DETECTED NOT DETECTED Final    Comment: Performed at [facility], [address], [place], [place] [address]    Labs: CBC: Recent Labs  Lab 10/12/24 0536 10/14/24  0256 10/15/24 0323  WBC 6.5 5.2 6.3  NEUTROABS 4.1  --   --   HGB 7.9* 7.3* 8.5*  HCT 25.9* 23.4* 26.9*  MCV 95.6 94.0 94.7  PLT 260 236 213   Basic Metabolic Panel: Recent Labs  Lab 10/12/24 0536 10/13/24 0538 10/14/24 0256 10/15/24 0323  NA 136 136 136 131*  K 3.2* 3.9 3.9 3.9  CL 98 96* 96* 95*  CO2 24 29 26 26   GLUCOSE 103* 97 91 83  BUN 48* 29* 39* 25*  CREATININE 6.65* 4.51* 5.21* 3.58*  CALCIUM 9.1 9.4 9.4 8.8*  MG  --   --  1.9  --   PHOS  --  3.7 4.2  --    Liver Function Tests: Recent Labs  Lab 10/13/24 0538 10/14/24 0256  ALBUMIN  2.8* 2.9*   CBG: Recent Labs  Lab 10/14/24 1158 10/14/24 1722 10/14/24 2113 10/15/24 0741 10/15/24 1155  GLUCAP 122* 112* 128* 95 102*    Discharge time spent: greater than 30 minutes.  This record has been created using Conservation officer, historic buildings. Errors have been sought and corrected,but may not always be located. Such creation errors do not reflect on the  standard of care.   Signed: Amaryllis Dare, MD [facility] 10/15/2024

## 2024-10-15 NOTE — Progress Notes (Signed)
 [facility]  ROUNDING NOTE   Subjective:   Brianna [name] is a 63 year old female with past medical conditions including obstructive sleep apnea on CPAP, gout, GERD, type 2 diabetes, and end-stage renal disease on hemodialysis. Patient presents to ED with shortness of breath and has been admitted for DOE (dyspnea on exertion) [R06.09] Exacerbation of intermittent asthma, unspecified asthma severity [J45.21] Acute COVID-19 [U07.1] COVID-19 [U07.1]  Patient is known to our practice and receives outpatient dialysis treatments on MWF at [facility], supervised by Dr marcelino.   Update  Patient seen sitting up in bed States she was able to sit up majority of the day yesterday in recliner Remains on high flow nasal cannula, hoping to wean Receive dialysis yesterday.  Objective:  Vital signs in last 24 hours:  Temp:  [97.8 F (36.6 C)-98.6 F (37 C)] 98.6 F (37 C) (10/16 0740) Pulse Rate:  [68-88] 68 (10/16 0740) Resp:  [18-30] 19 (10/16 0740) BP: (94-154)/(51-78) 146/63 (10/16 0740) SpO2:  [98 %-100 %] 100 % (10/16 0740) Weight:  [86.6 kg] 86.6 kg (10/16 0500)  Weight change: 1.9 kg Filed Weights   10/12/24 1920 10/14/24 0500 10/15/24 0500  Weight: 84.7 kg 84.7 kg 86.6 kg    Intake/Output: I/O last 3 completed shifts: In: 470.9 [I.V.:8; Blood:462.9] Out: 2000 [Other:2000]   Intake/Output this shift:  No intake/output data recorded.  Physical Exam: General: NAD  Head: Normocephalic  Eyes: Anicteric  Lungs:  Clear,  O2  Heart: Regular rate  Abdomen:  Soft  Extremities: Trace peripheral edema.  Neurologic: Alert, oriented  Skin: No rashes  Access: Rt chest permcath, Left AVF maturing    Basic Metabolic Panel: Recent Labs  Lab 10/12/24 0536 10/13/24 0538 10/14/24 0256 10/15/24 0323  NA 136 136 136 131*  K 3.2* 3.9 3.9 3.9  CL 98 96* 96* 95*  CO2 24 29 26 26   GLUCOSE 103* 97 91 83  BUN 48* 29* 39* 25*  CREATININE 6.65* 4.51* 5.21*  3.58*  CALCIUM 9.1 9.4 9.4 8.8*  MG  --   --  1.9  --   PHOS  --  3.7 4.2  --     Liver Function Tests: Recent Labs  Lab 10/13/24 0538 10/14/24 0256  ALBUMIN  2.8* 2.9*    No results for input(s): LIPASE, AMYLASE in the last 168 hours. No results for input(s): AMMONIA in the last 168 hours.  CBC: Recent Labs  Lab 10/12/24 0536 10/14/24 0256 10/15/24 0323  WBC 6.5 5.2 6.3  NEUTROABS 4.1  --   --   HGB 7.9* 7.3* 8.5*  HCT 25.9* 23.4* 26.9*  MCV 95.6 94.0 94.7  PLT 260 236 213    Cardiac Enzymes: No results for input(s): CKTOTAL, CKMB, CKMBINDEX, TROPONINI in the last 168 hours.  BNP: Invalid input(s): POCBNP  CBG: Recent Labs  Lab 10/14/24 0802 10/14/24 1158 10/14/24 1722 10/14/24 2113 10/15/24 0741  GLUCAP 82 122* 112* 128* 95     Microbiology: Results for orders placed or performed during the hospital encounter of 10/12/24  Resp panel by RT-PCR (RSV, Flu A&B, Covid) Anterior Nasal Swab     Status: Abnormal   Collection Time: 10/12/24  5:36 AM   Specimen: Anterior Nasal Swab  Result Value Ref Range Status   SARS Coronavirus 2 by RT PCR POSITIVE (A) NEGATIVE Final    Comment: (NOTE) SARS-CoV-2 target nucleic acids are DETECTED.  The SARS-CoV-2 RNA is generally detectable in upper respiratory specimens during the acute phase of infection.  Positive results are indicative of the presence of the identified virus, but do not rule out bacterial infection or co-infection with other pathogens not detected by the test. Clinical correlation with patient history and other diagnostic information is necessary to determine patient infection status. The expected result is Negative.  Fact Sheet for Patients: BloggerCourse.com  Fact Sheet for Healthcare Providers: SeriousBroker.it  This test is not yet approved or cleared by the [place]  FDA and  has been authorized for detection and/or  diagnosis of SARS-CoV-2 by FDA under an Emergency Use Authorization (EUA).  This EUA will remain in effect (meaning this test can be used) for the duration of  the COVID-19 declaration under Section 564(b)(1) of the A ct, 21 U.S.C. section 360bbb-3(b)(1), unless the authorization is terminated or revoked sooner.     Influenza A by PCR NEGATIVE NEGATIVE Final   Influenza B by PCR NEGATIVE NEGATIVE Final    Comment: (NOTE) The Xpert Xpress SARS-CoV-2/FLU/RSV plus assay is intended as an aid in the diagnosis of influenza from Nasopharyngeal swab specimens and should not be used as a sole basis for treatment. Nasal washings and aspirates are unacceptable for Xpert Xpress SARS-CoV-2/FLU/RSV testing.  Fact Sheet for Patients: BloggerCourse.com  Fact Sheet for Healthcare Providers: SeriousBroker.it  This test is not yet approved or cleared by the [place]  FDA and has been authorized for detection and/or diagnosis of SARS-CoV-2 by FDA under an Emergency Use Authorization (EUA). This EUA will remain in effect (meaning this test can be used) for the duration of the COVID-19 declaration under Section 564(b)(1) of the Act, 21 U.S.C. section 360bbb-3(b)(1), unless the authorization is terminated or revoked.     Resp Syncytial Virus by PCR NEGATIVE NEGATIVE Final    Comment: (NOTE) Fact Sheet for Patients: BloggerCourse.com  Fact Sheet for Healthcare Providers: SeriousBroker.it  This test is not yet approved or cleared by the [place]  FDA and has been authorized for detection and/or diagnosis of SARS-CoV-2 by FDA under an Emergency Use Authorization (EUA). This EUA will remain in effect (meaning this test can be used) for the duration of the COVID-19 declaration under Section 564(b)(1) of the Act, 21 U.S.C. section 360bbb-3(b)(1), unless the authorization is terminated  or revoked.  Performed at [facility], [address] [place], [place] [address]   C Difficile Quick Screen w PCR reflex     Status: None   Collection Time: 10/13/24 10:00 AM   Specimen: STOOL  Result Value Ref Range Status   C Diff antigen NEGATIVE NEGATIVE Final   C Diff toxin NEGATIVE NEGATIVE Final   C Diff interpretation No C. difficile detected.  Final    Comment: Performed at [facility], [address] [place], [place] [address]  Gastrointestinal Panel by PCR , Stool     Status: None   Collection Time: 10/13/24 10:00 AM   Specimen: Stool  Result Value Ref Range Status   Campylobacter species NOT DETECTED NOT DETECTED Final   Plesimonas shigelloides NOT DETECTED NOT DETECTED Final   Salmonella species NOT DETECTED NOT DETECTED Final   Yersinia enterocolitica NOT DETECTED NOT DETECTED Final   Vibrio species NOT DETECTED NOT DETECTED Final   Vibrio cholerae NOT DETECTED NOT DETECTED Final   Enteroaggregative E coli (EAEC) NOT DETECTED NOT DETECTED Final   Enteropathogenic E coli (EPEC) NOT DETECTED NOT DETECTED Final   Enterotoxigenic E coli (ETEC) NOT DETECTED NOT DETECTED Final   Shiga like toxin producing E coli (STEC) NOT DETECTED NOT DETECTED  Final   Shigella/Enteroinvasive E coli (EIEC) NOT DETECTED NOT DETECTED Final   Cryptosporidium NOT DETECTED NOT DETECTED Final   Cyclospora cayetanensis NOT DETECTED NOT DETECTED Final   Entamoeba histolytica NOT DETECTED NOT DETECTED Final   Giardia lamblia NOT DETECTED NOT DETECTED Final   Adenovirus F40/41 NOT DETECTED NOT DETECTED Final   Astrovirus NOT DETECTED NOT DETECTED Final   Norovirus GI/GII NOT DETECTED NOT DETECTED Final   Rotavirus A NOT DETECTED NOT DETECTED Final   Sapovirus (I, II, IV, and V) NOT DETECTED NOT DETECTED Final    Comment: Performed at [facility], [address] [place], [place] [address]    Coagulation Studies: No results for input(s): LABPROT, INR in  the last 72 hours.  Urinalysis: No results for input(s): COLORURINE, LABSPEC, PHURINE, GLUCOSEU, HGBUR, BILIRUBINUR, KETONESUR, PROTEINUR, UROBILINOGEN, NITRITE, LEUKOCYTESUR in the last 72 hours.  Invalid input(s): APPERANCEUR    Imaging: No results found.    Medications:     sodium chloride    Intravenous Once   amLODipine   5 mg Oral Daily   aspirin  EC  81 mg Oral Daily   Chlorhexidine  Gluconate Cloth  6 each Topical Q0600   heparin   5,000 Units Subcutaneous Q8H   insulin  aspart  0-5 Units Subcutaneous QHS   insulin  aspart  0-6 Units Subcutaneous TID WC   sertraline   50 mg Oral Daily   sevelamer  carbonate  1,600 mg Oral TID WC   sodium chloride  flush  3 mL Intravenous Q12H   acetaminophen  **OR** acetaminophen , loperamide, midodrine , ondansetron  **OR** ondansetron  (ZOFRAN ) IV, mouth rinse, oxyCODONE , sodium chloride   Assessment/ Plan:  Ms. Grainne Knights is a 63 y.o.  female  with past medical conditions including obstructive sleep apnea on CPAP, gout, GERD, type 2 diabetes, and end-stage renal disease on hemodialysis.  Patient is currently admitted for DOE (dyspnea on exertion) [R06.09] Exacerbation of intermittent asthma, unspecified asthma severity [J45.21] Acute COVID-19 [U07.1] COVID-19 [U07.1]   [facility]/MWF/right IJ PermCath/left upper AVF.    End-stage renal disease on hemodialysis.  Continue MWF schedule.  Patient received dialysis yesterday, UF 2 L achieved.  Next treatment scheduled for Friday.    2. Anemia of chronic kidney disease Hemoglobin & Hematocrit     Component Value Date/Time   HGB 8.5 (L) 10/15/2024 0323   HGB 14.6 04/22/2013 1549   HCT 26.9 (L) 10/15/2024 0323   HCT 44.0 04/22/2013 1549   Hgb decreased to 8.5.  Continue Retacrit  10000 units IV with dialysis patient received 1 unit blood transfusion during dialysis.   3.  Secondary Hyperparathyroidism: with outpatient labs: PTH 993, phosphorus 4.9,  calcium 9.5 on 09/07/24.   Will continue to monitor bone minerals during this admission.  4. Diabetes mellitus type II with chronic kidney disease/renal manifestations: noninsulin dependent. Home regimen includes Ozempic. Most recent hemoglobin A1c is 5.6 on 09/13/24.    Glucose well-controlled, sliding scale insulin  managed by primary team.     LOS: 0 Brianna [name] 10/16/202511:08 AM

## 2024-10-15 NOTE — Evaluation (Signed)
 Physical Therapy Evaluation Patient Details Name: Brianna [name] MRN: [id number] DOB: 1961-10-14 Today's Date: 10/15/2024  History of Present Illness  [name] is a 63 y.o. female with medical history significant of sleep apnea on CPAP, CKD on dialysis, diabetes mellitus 2, hypertension, asthma, anemia.  Patient was recently hospitalized last month after emergent hernia repair secondary to incarcerated hernia.  She required rehabilitative therapies at a local SNF.  [facility].  Patient reports that this past Thursday she began feeling weak and was having hot and cold flashes, had generalized achiness and myalgias, nausea and a dry cough, patient is COVID +   Clinical Impression  Patient received in recliner. She is agreeable to PT assessment. Patient reports sob and fatigue. Patient is able to stand from recliner with mod I. She ambulated around room x 2 with seated rest between due to fatigue, sob. O2 sats remained > 93% on room air during session. She will continue to benefit from skilled PT to improve strength and endurance.         If plan is discharge home, recommend the following: A little help with walking and/or transfers;A little help with bathing/dressing/bathroom;Help with stairs or ramp for entrance;Assist for transportation   Can travel by private vehicle   Yes    Equipment Recommendations None recommended by PT  Recommendations for Other Services       Functional Status Assessment Patient has had a recent decline in their functional status and demonstrates the ability to make significant improvements in function in a reasonable and predictable amount of time.     Precautions / Restrictions Precautions Precautions: Fall Recall of Precautions/Restrictions: Intact Restrictions Weight Bearing Restrictions Per Provider Order: No      Mobility  Bed Mobility               General bed mobility  comments: NT patient in recliner    Transfers Overall transfer level: Modified independent Equipment used: Rolling walker (2 wheels) Transfers: Sit to/from Stand Sit to Stand: Modified independent (Device/Increase time)                Ambulation/Gait Ambulation/Gait assistance: Supervision Gait Distance (Feet): 30 Feet Assistive device: Rolling walker (2 wheels) Gait Pattern/deviations: Step-through pattern Gait velocity: decr     General Gait Details: patient ambulated around room x 2 reps with seated rest between due to SOB. O2 sats on room air >93% throughout session.  Stairs            Wheelchair Mobility     Tilt Bed    Modified Rankin (Stroke Patients Only)       Balance Overall balance assessment: Modified Independent Sitting-balance support: Feet supported Sitting balance-Leahy Scale: Good     Standing balance support: Bilateral upper extremity supported, During functional activity, Reliant on assistive device for balance Standing balance-Leahy Scale: Good                               Pertinent Vitals/Pain Pain Assessment Pain Assessment: No/denies pain    Home Living Family/patient expects to be discharged to:: Private residence Living Arrangements: Alone Available Help at Discharge: Family;Friend(s);Available PRN/intermittently Type of Home: House Home Access: Stairs to enter Entrance Stairs-Rails: Right;Left;Can reach both Entrance Stairs-Number of Steps: 2   Home Layout: One level Home Equipment: Rollator (4 wheels)      Prior Function Prior Level of Function : Independent/Modified  Independent             Mobility Comments: amb with rollator home/community ADLs Comments: mod I     Extremity/Trunk Assessment   Upper Extremity Assessment Upper Extremity Assessment: Overall WFL for tasks assessed    Lower Extremity Assessment Lower Extremity Assessment: Generalized weakness    Cervical / Trunk  Assessment Cervical / Trunk Assessment: Normal  Communication   Communication Communication: No apparent difficulties    Cognition Arousal: Alert Behavior During Therapy: WFL for tasks assessed/performed   PT - Cognitive impairments: No apparent impairments                         Following commands: Intact Following commands impaired: Only follows one step commands consistently     Cueing Cueing Techniques: Verbal cues     General Comments      Exercises     Assessment/Plan    PT Assessment Patient needs continued PT services  PT Problem List Decreased strength;Decreased activity tolerance;Decreased mobility;Obesity       PT Treatment Interventions DME instruction;Gait training;Stair training;Functional mobility training;Therapeutic activities;Therapeutic exercise;Balance training;Neuromuscular re-education;Patient/family education    PT Goals (Current goals can be found in the Care Plan section)  Acute Rehab PT Goals Patient Stated Goal: return home, improve strength PT Goal Formulation: With patient Time For Goal Achievement: 10/29/24 Potential to Achieve Goals: Good    Frequency Min 2X/week     Co-evaluation               AM-PAC PT 6 Clicks Mobility  Outcome Measure Help needed turning from your back to your side while in a flat bed without using bedrails?: A Little Help needed moving from lying on your back to sitting on the side of a flat bed without using bedrails?: A Little Help needed moving to and from a bed to a chair (including a wheelchair)?: A Little Help needed standing up from a chair using your arms (e.g., wheelchair or bedside chair)?: None Help needed to walk in hospital room?: A Little Help needed climbing 3-5 steps with a railing? : A Little 6 Click Score: 19    End of Session Equipment Utilized During Treatment: Oxygen Activity Tolerance: Patient limited by fatigue Patient left: in chair;with call bell/phone within  reach Nurse Communication: Mobility status PT Visit Diagnosis: Other abnormalities of gait and mobility (R26.89);Muscle weakness (generalized) (M62.81);Difficulty in walking, not elsewhere classified (R26.2)    Time: 8872-8856 PT Time Calculation (min) (ACUTE ONLY): 16 min   Charges:   PT Evaluation $PT Eval Low Complexity: 1 Low   PT General Charges $$ ACUTE PT VISIT: 1 Visit         Ezri Fanguy, PT, GCS 10/15/24,11:54 AM

## 2024-10-15 NOTE — Plan of Care (Signed)

## 2024-10-15 NOTE — TOC Initial Note (Signed)
 Transition of Care [facility]) - Initial/Assessment Note    Patient Details  Name: Brianna [name] MRN: [id number] Date of Birth: 1961-01-17  Transition of Care [facility]) CM/SW Contact:    Alvaro Louder, LCSW Phone Number: 10/15/2024, 11:32 AM  Clinical Narrative:    Per Chart review patient from home PCP is Morene Potash LCSWA spoke to the patient at the bedside and she indicated that she wants HH at discharge. She also indicated that she wants DME 02. MD indicated that she does not qualify for DME 02.   TOC to follow for discharge                Patient Goals and CMS Choice            Expected Discharge Plan and Services                                              Prior Living Arrangements/Services                       Activities of Daily Living   ADL Screening (condition at time of admission) Independently performs ADLs?: Yes (appropriate for developmental age) Is the patient deaf or have difficulty hearing?: No Does the patient have difficulty seeing, even when wearing glasses/contacts?: No Does the patient have difficulty concentrating, remembering, or making decisions?: No  Permission Sought/Granted                  Emotional Assessment              Admission diagnosis:  DOE (dyspnea on exertion) [R06.09] Exacerbation of intermittent asthma, unspecified asthma severity [J45.21] Acute COVID-19 [U07.1] COVID-19 [U07.1] Patient Active Problem List   Diagnosis Date Noted   Acute COVID-19 10/12/2024   Shock circulatory (HCC) 09/15/2024   SBO (small bowel obstruction) (HCC) 09/15/2024   Incarcerated ventral hernia 09/13/2024   Hernia of abdominal wall 09/13/2024   Bilateral chronic knee pain 07/23/2024   Chronic pain syndrome 07/14/2024   Chronic painful diabetic neuropathy (HCC) 07/14/2024   Anxiety and depression 07/14/2024   Morbidly obese (HCC) 05/29/2023   Acute on chronic diastolic CHF (congestive heart failure)  (HCC) 05/29/2023   Anemia of chronic kidney failure 05/29/2023   Acute on chronic congestive heart failure (HCC) 05/24/2023   Anasarca 05/23/2023   (HFpEF) heart failure with preserved ejection fraction (HCC) 05/23/2023   ESRD (end stage renal disease) (HCC) 05/23/2023   Dyspnea 01/18/2021   Bilateral lower extremity edema 01/18/2021   Volume overload 01/18/2021   Bronchopneumonia 01/12/2021   Type II diabetes mellitus with renal manifestations (HCC) 01/12/2021   Acid reflux    Asthma    Anemia due to stage 4 chronic kidney disease (HCC)    Hypokalemia    Type 2 diabetes mellitus without complication, with long-term current use of insulin  (HCC)    Acute renal failure superimposed on stage 4 chronic kidney disease (HCC)    Hypertension    Strangulated ventral hernia 02/09/2020   PCP:  Potash Morene, PA-C Pharmacy:   [facility] - [place], [place] - [address] [place] [address] Phone: [phone] Fax: [phone]  [facility] - [place], [place] - [address] [place] [address] Phone: [phone] Fax: [phone]  [id number] [place], [place] -  [address] [place] [address] Phone: [phone] Fax: [phone]  [facility] [address] [place] [address] Phone: [phone] Fax: [phone]     Social Drivers of Health (SDOH) Social History: SDOH Screenings   Food Insecurity: No Food Insecurity (10/12/2024)  Housing: Low Risk  (10/12/2024)  Transportation Needs: No Transportation Needs (10/12/2024)  Utilities: Not At Risk (10/12/2024)  Depression (PHQ2-9): Low Risk  (09/08/2024)  Financial Resource Strain: High Risk (06/04/2024)   Received from [facility]  Social Connections: Unknown (09/18/2024)  Tobacco Use: Low Risk  (10/12/2024)   SDOH Interventions:      Readmission Risk Interventions     No data to display

## 2024-10-15 NOTE — Discharge Instructions (Signed)
 [employer]  Call (toll free) to speak to an Oxygen Specialist now:  [phone]

## 2024-10-15 NOTE — TOC CM/SW Note (Cosign Needed)
 Patient is not able to walk the distance required to go the bathroom, or he/she is unable to safely negotiate stairs required to access the bathroom.  A 3in1 BSC will alleviate this problem

## 2024-10-15 NOTE — TOC Transition Note (Signed)
 Transition of Care [facility]) - Discharge Note   Patient Details  Name: Brianna [name] MRN: [id number] Date of Birth: 30-Jan-1961  [facility]) CM/SW Contact:  Marinda Cooks, RN Phone Number: 10/15/2024, 3:11 PM   Clinical Narrative:    This CM updated by covering MD pt medically cleared to dc today and has active DC order . This CM spoke with pt who was accompanied by her cousin Diane and introduced role and discussed HH & DME orders pt confirmed she did not have preference of agency. This CM  spoke with [facility] arranged with [place]  DC transportation confirmed for pt with pt's cousin Diane .Medical team updated . DME referral sent to [facility] this CM alerted pt had an outstanding balance and they would reach out to her regarding this.  No additional DC needs requested by medical team or identified by CM at this time .     Final next level of care: Home w Home Health Services     Patient Goals and CMS Choice     Choice offered to / list presented to : Patient    Name of family member notified: Patient Patient and family notified of of transfer: 10/15/24  Discharge Plan and Services Additional resources added to the After Visit Summary for                  DME Arranged: 3-N-1 DME Agency: [facility] Date DME Agency Contacted: 10/15/24 Time DME Agency Contacted: 1510 Representative spoke with at DME Agency: Mitch HH Arranged: PT, OT, RN HH Agency: [facility] Date [facility] Agency Contacted: 10/15/24 Time HH Agency Contacted: 1510 Representative spoke with at [facility] Agency: Darleene  Social Drivers of Health (SDOH) Interventions SDOH Screenings   Food Insecurity: No Food Insecurity (10/12/2024)  Housing: Low Risk  (10/12/2024)  Transportation Needs: No Transportation Needs (10/12/2024)  Utilities: Not At Risk (10/12/2024)  Depression (PHQ2-9): Low Risk  (09/08/2024)  Financial Resource Strain: High Risk (06/04/2024)   Received from [facility]   Social Connections: Unknown (09/18/2024)  Tobacco Use: Low Risk  (10/12/2024)     Readmission Risk Interventions     No data to display

## 2024-10-15 NOTE — Progress Notes (Signed)
 D/C order noted. Contacted [facility] MWF with 6:30am chair time to be advised of pt and d/c today. Requested documents faxed to clinic for continuation of care.  Suzen Satchel Dialysis Navigator [phone]

## 2024-10-29 ENCOUNTER — Encounter: Admitting: General Surgery

## 2024-11-05 ENCOUNTER — Encounter: Admitting: General Surgery

## 2024-11-10 ENCOUNTER — Emergency Department

## 2024-11-10 ENCOUNTER — Other Ambulatory Visit: Payer: Self-pay

## 2024-11-10 ENCOUNTER — Ambulatory Visit: Admitting: Student in an Organized Health Care Education/Training Program

## 2024-11-10 ENCOUNTER — Inpatient Hospital Stay
Admission: EM | Admit: 2024-11-10 | Discharge: 2024-11-12 | DRG: 862 | Disposition: A | Discharge status: Home / Self Care | Attending: Internal Medicine | Admitting: Internal Medicine

## 2024-11-10 DIAGNOSIS — Z9049 Acquired absence of other specified parts of digestive tract: Secondary | ICD-10-CM

## 2024-11-10 DIAGNOSIS — Z8616 Personal history of COVID-19: Secondary | ICD-10-CM

## 2024-11-10 DIAGNOSIS — I5030 Unspecified diastolic (congestive) heart failure: Secondary | ICD-10-CM | POA: Diagnosis present

## 2024-11-10 DIAGNOSIS — Z794 Long term (current) use of insulin: Secondary | ICD-10-CM

## 2024-11-10 DIAGNOSIS — T8140XA Infection following a procedure, unspecified, initial encounter: Secondary | ICD-10-CM

## 2024-11-10 DIAGNOSIS — T8131XA Disruption of external operation (surgical) wound, not elsewhere classified, initial encounter: Secondary | ICD-10-CM | POA: Diagnosis present

## 2024-11-10 DIAGNOSIS — M109 Gout, unspecified: Secondary | ICD-10-CM | POA: Diagnosis present

## 2024-11-10 DIAGNOSIS — Z7982 Long term (current) use of aspirin: Secondary | ICD-10-CM

## 2024-11-10 DIAGNOSIS — E785 Hyperlipidemia, unspecified: Secondary | ICD-10-CM | POA: Diagnosis present

## 2024-11-10 DIAGNOSIS — N186 End stage renal disease: Secondary | ICD-10-CM | POA: Diagnosis present

## 2024-11-10 DIAGNOSIS — Z6841 Body Mass Index (BMI) 40.0 and over, adult: Secondary | ICD-10-CM

## 2024-11-10 DIAGNOSIS — L02211 Cutaneous abscess of abdominal wall: Secondary | ICD-10-CM

## 2024-11-10 DIAGNOSIS — I5032 Chronic diastolic (congestive) heart failure: Secondary | ICD-10-CM | POA: Diagnosis present

## 2024-11-10 DIAGNOSIS — G4733 Obstructive sleep apnea (adult) (pediatric): Secondary | ICD-10-CM | POA: Diagnosis present

## 2024-11-10 DIAGNOSIS — E119 Type 2 diabetes mellitus without complications: Secondary | ICD-10-CM

## 2024-11-10 DIAGNOSIS — Y838 Other surgical procedures as the cause of abnormal reaction of the patient, or of later complication, without mention of misadventure at the time of the procedure: Secondary | ICD-10-CM | POA: Diagnosis present

## 2024-11-10 DIAGNOSIS — I132 Hypertensive heart and chronic kidney disease with heart failure and with stage 5 chronic kidney disease, or end stage renal disease: Secondary | ICD-10-CM | POA: Diagnosis present

## 2024-11-10 DIAGNOSIS — Z7985 Long-term (current) use of injectable non-insulin antidiabetic drugs: Secondary | ICD-10-CM

## 2024-11-10 DIAGNOSIS — Z7951 Long term (current) use of inhaled steroids: Secondary | ICD-10-CM

## 2024-11-10 DIAGNOSIS — T8149XA Infection following a procedure, other surgical site, initial encounter: Principal | ICD-10-CM | POA: Diagnosis present

## 2024-11-10 DIAGNOSIS — L03311 Cellulitis of abdominal wall: Principal | ICD-10-CM | POA: Diagnosis present

## 2024-11-10 DIAGNOSIS — B9689 Other specified bacterial agents as the cause of diseases classified elsewhere: Secondary | ICD-10-CM | POA: Diagnosis present

## 2024-11-10 DIAGNOSIS — T148XXA Other injury of unspecified body region, initial encounter: Secondary | ICD-10-CM

## 2024-11-10 DIAGNOSIS — E1122 Type 2 diabetes mellitus with diabetic chronic kidney disease: Secondary | ICD-10-CM | POA: Diagnosis present

## 2024-11-10 DIAGNOSIS — I10 Essential (primary) hypertension: Secondary | ICD-10-CM | POA: Diagnosis present

## 2024-11-10 DIAGNOSIS — J4489 Other specified chronic obstructive pulmonary disease: Secondary | ICD-10-CM | POA: Diagnosis present

## 2024-11-10 DIAGNOSIS — Z992 Dependence on renal dialysis: Secondary | ICD-10-CM

## 2024-11-10 DIAGNOSIS — D631 Anemia in chronic kidney disease: Secondary | ICD-10-CM | POA: Diagnosis present

## 2024-11-10 DIAGNOSIS — Z79899 Other long term (current) drug therapy: Secondary | ICD-10-CM

## 2024-11-10 DIAGNOSIS — K219 Gastro-esophageal reflux disease without esophagitis: Secondary | ICD-10-CM | POA: Diagnosis present

## 2024-11-10 DIAGNOSIS — E66812 Obesity, class 2: Secondary | ICD-10-CM | POA: Diagnosis present

## 2024-11-10 DIAGNOSIS — Z888 Allergy status to other drugs, medicaments and biological substances status: Secondary | ICD-10-CM

## 2024-11-10 DIAGNOSIS — E876 Hypokalemia: Secondary | ICD-10-CM | POA: Diagnosis present

## 2024-11-10 LAB — COMPREHENSIVE METABOLIC PANEL WITH GFR
ALT: 8 U/L (ref 0–44)
AST: 16 U/L (ref 15–41)
Albumin: 2.9 g/dL — ABNORMAL LOW (ref 3.5–5.0)
Alkaline Phosphatase: 110 U/L (ref 38–126)
Anion gap: 16 — ABNORMAL HIGH (ref 5–15)
BUN: 33 mg/dL — ABNORMAL HIGH (ref 8–23)
CO2: 26 mmol/L (ref 22–32)
Calcium: 8 mg/dL — ABNORMAL LOW (ref 8.9–10.3)
Chloride: 97 mmol/L — ABNORMAL LOW (ref 98–111)
Creatinine, Ser: 4.08 mg/dL — ABNORMAL HIGH (ref 0.44–1.00)
GFR, Estimated: 12 mL/min — ABNORMAL LOW (ref >60)
Glucose, Bld: 136 mg/dL — ABNORMAL HIGH (ref 70–99)
Potassium: 3.2 mmol/L — ABNORMAL LOW (ref 3.5–5.1)
Sodium: 139 mmol/L (ref 135–145)
Total Bilirubin: 0.6 mg/dL (ref 0.0–1.2)
Total Protein: 7.3 g/dL (ref 6.5–8.1)

## 2024-11-10 LAB — CBC WITH DIFFERENTIAL/PLATELET
Abs Immature Granulocytes: 0.06 K/uL (ref 0.00–0.07)
Basophils Absolute: 0 K/uL (ref 0.0–0.1)
Basophils Relative: 0 %
Eosinophils Absolute: 0 K/uL (ref 0.0–0.5)
Eosinophils Relative: 0 %
HCT: 52.5 % — ABNORMAL HIGH (ref 36.0–46.0)
Hemoglobin: 16.3 g/dL — ABNORMAL HIGH (ref 12.0–15.0)
Immature Granulocytes: 1 %
Lymphocytes Relative: 8 %
Lymphs Abs: 0.7 K/uL (ref 0.7–4.0)
MCH: 28.5 pg (ref 26.0–34.0)
MCHC: 31 g/dL (ref 30.0–36.0)
MCV: 91.8 fL (ref 80.0–100.0)
Monocytes Absolute: 0.8 K/uL (ref 0.1–1.0)
Monocytes Relative: 9 %
Neutro Abs: 7.3 K/uL (ref 1.7–7.7)
Neutrophils Relative %: 82 %
Platelets: 144 K/uL — ABNORMAL LOW (ref 150–400)
RBC: 5.72 MIL/uL — ABNORMAL HIGH (ref 3.87–5.11)
RDW: 15.6 % — ABNORMAL HIGH (ref 11.5–15.5)
WBC: 8.9 K/uL (ref 4.0–10.5)
nRBC: 0 % (ref 0.0–0.2)

## 2024-11-10 LAB — C DIFFICILE QUICK SCREEN W PCR REFLEX
C Diff antigen: NEGATIVE
C Diff interpretation: NOT DETECTED
C Diff toxin: NEGATIVE

## 2024-11-10 LAB — CBG MONITORING, ED: Glucose-Capillary: 94 mg/dL (ref 70–99)

## 2024-11-10 LAB — GLUCOSE, CAPILLARY
Glucose-Capillary: 104 mg/dL — ABNORMAL HIGH (ref 70–99)
Glucose-Capillary: 129 mg/dL — ABNORMAL HIGH (ref 70–99)

## 2024-11-10 LAB — HEMOGLOBIN A1C
Hgb A1c MFr Bld: 5 % (ref 4.8–5.6)
Mean Plasma Glucose: 96.8 mg/dL

## 2024-11-10 LAB — HEPATITIS B SURFACE ANTIGEN: Hepatitis B Surface Ag: NONREACTIVE

## 2024-11-10 LAB — LIPASE, BLOOD: Lipase: 24 U/L (ref 11–51)

## 2024-11-10 MED ORDER — ALLOPURINOL 300 MG PO TABS
300.0000 mg | ORAL_TABLET | ORAL | Status: DC
  Administered 2024-11-10 – 2024-11-11 (×2): 300 mg via ORAL
  Filled 2024-11-10 (×2): qty 1

## 2024-11-10 MED ORDER — LOPERAMIDE HCL 2 MG PO CAPS
2.0000 mg | ORAL_CAPSULE | ORAL | Status: DC | PRN
  Filled 2024-11-10: qty 1

## 2024-11-10 MED ORDER — PIPERACILLIN-TAZOBACTAM IN DEX 2-0.25 GM/50ML IV SOLN
2.2500 g | Freq: Three times a day (TID) | INTRAVENOUS | Status: DC
  Administered 2024-11-10 – 2024-11-12 (×6): 2.25 g via INTRAVENOUS
  Filled 2024-11-10 (×8): qty 50

## 2024-11-10 MED ORDER — HYDROMORPHONE HCL 1 MG/ML IJ SOLN
0.5000 mg | INTRAMUSCULAR | Status: DC | PRN
  Administered 2024-11-11: 0.5 mg via INTRAVENOUS
  Filled 2024-11-10: qty 0.5

## 2024-11-10 MED ORDER — OXYCODONE HCL 5 MG PO TABS
5.0000 mg | ORAL_TABLET | ORAL | Status: DC | PRN
  Administered 2024-11-11: 5 mg via ORAL
  Filled 2024-11-10: qty 1

## 2024-11-10 MED ORDER — IOHEXOL 300 MG/ML  SOLN: 100.0000 mL | Freq: Once | INTRAMUSCULAR | Status: DC | PRN

## 2024-11-10 MED ORDER — MIDODRINE HCL 5 MG PO TABS: 10.0000 mg | ORAL_TABLET | ORAL | Status: DC

## 2024-11-10 MED ORDER — CARVEDILOL 6.25 MG PO TABS: 12.5000 mg | ORAL_TABLET | Freq: Two times a day (BID) | ORAL | Status: DC

## 2024-11-10 MED ORDER — VANCOMYCIN HCL 2000 MG/400ML IV SOLN
2000.0000 mg | Freq: Once | INTRAVENOUS | Status: AC
  Administered 2024-11-10: 2000 mg via INTRAVENOUS
  Filled 2024-11-10: qty 400

## 2024-11-10 MED ORDER — MORPHINE SULFATE (PF) 2 MG/ML IV SOLN: 2.0000 mg | INTRAVENOUS | Status: DC | PRN

## 2024-11-10 MED ORDER — HYALURONAN 88 MG/4ML IX SOSY: 88.0000 mg | PREFILLED_SYRINGE | Freq: Once | INTRA_ARTICULAR | Status: DC

## 2024-11-10 MED ORDER — CAPSAICIN-CLEANSING GEL 8 % EX KIT: 4.0000 | PACK | Freq: Once | CUTANEOUS | Status: DC

## 2024-11-10 MED ORDER — POTASSIUM CHLORIDE 10 MEQ/100ML IV SOLN
10.0000 meq | INTRAVENOUS | Status: AC
Start: 2024-11-10 — End: 2024-11-10
  Administered 2024-11-10 (×2): 10 meq via INTRAVENOUS
  Filled 2024-11-10 (×2): qty 100

## 2024-11-10 MED ORDER — MORPHINE SULFATE (PF) 2 MG/ML IV SOLN
2.0000 mg | Freq: Once | INTRAVENOUS | Status: AC
  Administered 2024-11-10: 2 mg via INTRAVENOUS
  Filled 2024-11-10: qty 1

## 2024-11-10 MED ORDER — FUROSEMIDE 40 MG PO TABS
80.0000 mg | ORAL_TABLET | Freq: Every day | ORAL | Status: DC
Start: 2024-11-10 — End: 2024-11-10

## 2024-11-10 MED ORDER — ACETAMINOPHEN 650 MG RE SUPP: 650.0000 mg | Freq: Four times a day (QID) | RECTAL | Status: DC | PRN

## 2024-11-10 MED ORDER — PIPERACILLIN-TAZOBACTAM 3.375 G IVPB: 3.3750 g | Freq: Three times a day (TID) | INTRAVENOUS | Status: DC

## 2024-11-10 MED ORDER — ACETAMINOPHEN 325 MG PO TABS
650.0000 mg | ORAL_TABLET | Freq: Four times a day (QID) | ORAL | Status: DC | PRN
  Administered 2024-11-10 – 2024-11-12 (×2): 650 mg via ORAL
  Filled 2024-11-10 (×2): qty 2

## 2024-11-10 MED ORDER — INSULIN ASPART 100 UNIT/ML IJ SOLN: 0.0000 [IU] | Freq: Three times a day (TID) | INTRAMUSCULAR | Status: DC

## 2024-11-10 MED ORDER — CHLORHEXIDINE GLUCONATE CLOTH 2 % EX PADS
6.0000 | MEDICATED_PAD | Freq: Every day | CUTANEOUS | Status: DC
  Administered 2024-11-11 – 2024-11-12 (×2): 6 via TOPICAL

## 2024-11-10 MED ORDER — ASPIRIN 81 MG PO TBEC
81.0000 mg | DELAYED_RELEASE_TABLET | Freq: Every day | ORAL | Status: DC
  Administered 2024-11-10 – 2024-11-12 (×2): 81 mg via ORAL
  Filled 2024-11-10 (×3): qty 1

## 2024-11-10 MED ORDER — LIDOCAINE HCL 2 % IJ SOLN: 20.0000 mL | Freq: Once | INTRAMUSCULAR | Status: DC

## 2024-11-10 MED ORDER — HEPARIN SODIUM (PORCINE) 5000 UNIT/ML IJ SOLN
5000.0000 [IU] | Freq: Three times a day (TID) | INTRAMUSCULAR | Status: DC
  Administered 2024-11-10 – 2024-11-12 (×3): 5000 [IU] via SUBCUTANEOUS
  Filled 2024-11-10 (×3): qty 1

## 2024-11-10 MED ORDER — IOHEXOL 300 MG/ML  SOLN
100.0000 mL | Freq: Once | INTRAMUSCULAR | Status: AC | PRN
Start: 2024-11-10 — End: 2024-11-10
  Administered 2024-11-10: 100 mL via INTRAVENOUS

## 2024-11-10 MED ORDER — PIPERACILLIN-TAZOBACTAM 3.375 G IVPB
3.3750 g | Freq: Once | INTRAVENOUS | Status: AC
  Administered 2024-11-10: 3.375 g via INTRAVENOUS
  Filled 2024-11-10: qty 50

## 2024-11-10 MED ORDER — ALBUTEROL SULFATE (2.5 MG/3ML) 0.083% IN NEBU: 2.5000 mg | INHALATION_SOLUTION | RESPIRATORY_TRACT | Status: DC | PRN

## 2024-11-10 MED ORDER — FUROSEMIDE 40 MG PO TABS: 80.0000 mg | ORAL_TABLET | Freq: Every day | ORAL | Status: DC | PRN

## 2024-11-10 MED ORDER — LACTULOSE 10 GM/15ML PO SOLN: 10.0000 g | Freq: Every day | ORAL | Status: DC | PRN

## 2024-11-10 MED ORDER — AMLODIPINE BESYLATE 5 MG PO TABS
5.0000 mg | ORAL_TABLET | Freq: Every day | ORAL | Status: DC
  Administered 2024-11-10 – 2024-11-12 (×3): 5 mg via ORAL
  Filled 2024-11-10 (×3): qty 1

## 2024-11-10 NOTE — Progress Notes (Signed)
 Pt receives outpt HD at [facility] MWF. Navigator following to assist with any HD needs.  Suzen Satchel Dialysis Navigator [phone]

## 2024-11-10 NOTE — Plan of Care (Signed)

## 2024-11-10 NOTE — ED Provider Notes (Signed)
 7:10 AM Assumed care for off going team.   Blood pressure 139/64, pulse 94, temperature 99 F (37.2 C), temperature source Oral, resp. rate 17, height 4' 9 (1.448 m), weight 92.1 kg, SpO2 96%.  See their HPI for full report but in brief pending CT/labs   Lipase is normal CBC shows normal white count.  CMP shows creatinine at 4.08 which patient, fluctuates elevated.  Potassium is 3.2.  On review of records from 10/12/2024 patient is a ESRD patient on hemodialysis Monday Wednesday Friday.  Given CKD will change her to a without contrast CT scan to start with.  However after review of records patient is actually on dialysis and so this is not CKD.  She reports being on dialysis for over a year and has had contrasted CTs previously without any issues I do think contrast would be beneficial given the concern for potential underlying abscess so discussed with patient she reports no issues with getting contrast in the past and was okay with proceeding with contrasted CT imaging.  I did review the records and she has had contrasted CT imaging multiple times.    IMPRESSION: 1. Skin thickening with diffuse subcutaneous edema/inflammation in the anterior abdominal wall left greater than right in the upper abdomen an fairly symmetric more inferiorly in the anterior abdominal wall and pannus. At least 2 distinct rim enhancing fluid collections are evident, 1 of which measures 3.1 x 2.3 cm and the second more inferior in the pannus measures 7.5 x 6.6 x 4.3 cm. Umbilicus is not well demonstrated on the study hindering localization of these collections relative to the umbilical anatomy. Imaging features are compatible with abscesses. 2. Fluid density is seen throughout the colonic lumen from the ascending segment to the distal rectum. Imaging features can be seen in the setting of diarrhea. 3. Collapse/consolidation of the right base is similar to prior. Interval improvement in left lower lobe  aeration. 4. Tiny hiatal hernia. 5. Large calcified uterine fibroid. 6. Pelvic floor laxity. 7.  Aortic Atherosclerosis (ICD10-I70.0).      Patient was seen by surgical team and did concerned that it was an abscess.  They sent off a wound culture.  They recommend admission for IV antibiotics.      Ernest Ronal BRAVO, MD 11/10/24 [phone]

## 2024-11-10 NOTE — ED Notes (Signed)
 Pt cleaned of stool, new brief and chuck pad placed. Pt covered with blanket. Informed pt still waiting for IV team to place IV, but if pt has any needs to let this RN know.

## 2024-11-10 NOTE — Progress Notes (Signed)
   11/10/24 1428  Vitals  Temp 99 F (37.2 C)  BP 120/74  MAP (mmHg) 84  BP Location Right Arm  BP Method Automatic  Patient Position (if appropriate) Sitting  Pulse Rate 87  Pulse Rate Source Monitor  Resp 16  MEWS COLOR  MEWS Score Color Green  Oxygen Therapy  SpO2 99 %  O2 Device Room Air  MEWS Score  MEWS Temp 0  MEWS Systolic 0  MEWS Pulse 0  MEWS RR 0  MEWS LOC 0  MEWS Score 0   Patient admitted from ED alert , room air, with stable vitals. Safety precautions in place with call bell education given and within reach.  Plan of care continues.

## 2024-11-10 NOTE — Progress Notes (Signed)
 No show

## 2024-11-10 NOTE — Consult Note (Signed)
 PHARMACY - BRIEF ANTIBIOTIC NOTE   Pharmacy has received consult(s) for vancomycin  from an ED provider. The patient's profile has been reviewed for ht/wt/allergies/indication/available labs.    One time order(s) placed for: vancomycin  2000mg  IV x 1  Further antibiotics/pharmacy consults should be ordered by admitting physician if indicated.                       Thank you,  Will M. Lenon, PharmD, BCPS Clinical Pharmacist 11/10/2024 10:00 AM

## 2024-11-10 NOTE — ED Notes (Signed)
 Surgery at bedside.

## 2024-11-10 NOTE — Progress Notes (Signed)
 [facility]  ROUNDING NOTE   Subjective:   Brianna [name] is a 63 year old female with past medical conditions including obstructive sleep apnea on CPAP, gout, GERD, type 2 diabetes, and end-stage renal disease on hemodialysis. Patient presents to ED with surgical sire bleeding and has been admitted under observation for Abdominal wall abscess [L02.211]  Patient is known to our practice and receives outpatient dialysis treatments on MWF at [facility], supervised by Dr marcelino. Last treatment received on Monday. Patient had hernia repair on 9/14. Patient now reports swelling, drainage and odor from surgical site. Denies fever or chills.   Labs on ED arrival show potassium 3.2, creatinine 4.08 with GFR 12, anion gap 16, calcium 8.0,.  CT abdomen pelvis shows subcutaneous edema and inflammation on the abdominal wall concerning for abscesses.  Culture collected from abscess.  We have been consulted to manage dialysis needs during this admission.  Objective:  Vital signs in last 24 hours:  Temp:  [99 F (37.2 C)] 99 F (37.2 C) (11/11 0629) Pulse Rate:  [89-94] 90 (11/11 1230) Resp:  [16-18] 18 (11/11 1230) BP: (127-147)/(64-82) 145/82 (11/11 1230) SpO2:  [94 %-97 %] 96 % (11/11 1230) Weight:  [92.1 kg] 92.1 kg (11/11 0630)  Weight change:  Filed Weights   11/10/24 0630  Weight: 92.1 kg    Intake/Output: No intake/output data recorded.   Intake/Output this shift:  No intake/output data recorded.  Physical Exam: General: NAD  Head: Normocephalic, atraumatic. Moist oral mucosal membranes  Eyes: Anicteric  Lungs:  Clear to auscultation, normal effort  Heart: Regular rate and rhythm  Abdomen:  Soft, nontender  Extremities: Trace peripheral edema.  Neurologic: Awake, alert, conversant  Skin: Warm,dry, no rash  Access: Right IJ PermCath    Basic Metabolic Panel: Recent Labs  Lab 11/10/24 0756  NA 139  K 3.2*  CL 97*  CO2 26  GLUCOSE 136*  BUN  33*  CREATININE 4.08*  CALCIUM 8.0*    Liver Function Tests: Recent Labs  Lab 11/10/24 0756  AST 16  ALT 8  ALKPHOS 110  BILITOT 0.6  PROT 7.3  ALBUMIN  2.9*   Recent Labs  Lab 11/10/24 0756  LIPASE 24   No results for input(s): AMMONIA in the last 168 hours.  CBC: Recent Labs  Lab 11/10/24 0756  WBC 8.9  NEUTROABS 7.3  HGB 16.3*  HCT 52.5*  MCV 91.8  PLT 144*    Cardiac Enzymes: No results for input(s): CKTOTAL, CKMB, CKMBINDEX, TROPONINI in the last 168 hours.  BNP: Invalid input(s): POCBNP  CBG: Recent Labs  Lab 11/10/24 1200  GLUCAP 94    Microbiology: Results for orders placed or performed during the hospital encounter of 10/12/24  Resp panel by RT-PCR (RSV, Flu A&B, Covid) Anterior Nasal Swab     Status: Abnormal   Collection Time: 10/12/24  5:36 AM   Specimen: Anterior Nasal Swab  Result Value Ref Range Status   SARS Coronavirus 2 by RT PCR POSITIVE (A) NEGATIVE Final    Comment: (NOTE) SARS-CoV-2 target nucleic acids are DETECTED.  The SARS-CoV-2 RNA is generally detectable in upper respiratory specimens during the acute phase of infection. Positive results are indicative of the presence of the identified virus, but do not rule out bacterial infection or co-infection with other pathogens not detected by the test. Clinical correlation with patient history and other diagnostic information is necessary to determine patient infection status. The expected result is Negative.  Fact Sheet for Patients: bloggercourse.com  Fact Sheet for Healthcare Providers: seriousbroker.it  This test is not yet approved or cleared by the [place]  FDA and  has been authorized for detection and/or diagnosis of SARS-CoV-2 by FDA under an Emergency Use Authorization (EUA).  This EUA will remain in effect (meaning this test can be used) for the duration of  the COVID-19 declaration under Section  564(b)(1) of the A ct, 21 U.S.C. section 360bbb-3(b)(1), unless the authorization is terminated or revoked sooner.     Influenza A by PCR NEGATIVE NEGATIVE Final   Influenza B by PCR NEGATIVE NEGATIVE Final    Comment: (NOTE) The Xpert Xpress SARS-CoV-2/FLU/RSV plus assay is intended as an aid in the diagnosis of influenza from Nasopharyngeal swab specimens and should not be used as a sole basis for treatment. Nasal washings and aspirates are unacceptable for Xpert Xpress SARS-CoV-2/FLU/RSV testing.  Fact Sheet for Patients: bloggercourse.com  Fact Sheet for Healthcare Providers: seriousbroker.it  This test is not yet approved or cleared by the [place]  FDA and has been authorized for detection and/or diagnosis of SARS-CoV-2 by FDA under an Emergency Use Authorization (EUA). This EUA will remain in effect (meaning this test can be used) for the duration of the COVID-19 declaration under Section 564(b)(1) of the Act, 21 U.S.C. section 360bbb-3(b)(1), unless the authorization is terminated or revoked.     Resp Syncytial Virus by PCR NEGATIVE NEGATIVE Final    Comment: (NOTE) Fact Sheet for Patients: bloggercourse.com  Fact Sheet for Healthcare Providers: seriousbroker.it  This test is not yet approved or cleared by the [place]  FDA and has been authorized for detection and/or diagnosis of SARS-CoV-2 by FDA under an Emergency Use Authorization (EUA). This EUA will remain in effect (meaning this test can be used) for the duration of the COVID-19 declaration under Section 564(b)(1) of the Act, 21 U.S.C. section 360bbb-3(b)(1), unless the authorization is terminated or revoked.  Performed at [facility], [address] [place], [place] [address]   C Difficile Quick Screen w PCR reflex     Status: None   Collection Time: 10/13/24 10:00 AM   Specimen: STOOL   Result Value Ref Range Status   C Diff antigen NEGATIVE NEGATIVE Final   C Diff toxin NEGATIVE NEGATIVE Final   C Diff interpretation No C. difficile detected.  Final    Comment: Performed at [facility], [address] [place], [place] [address]  Gastrointestinal Panel by PCR , Stool     Status: None   Collection Time: 10/13/24 10:00 AM   Specimen: Stool  Result Value Ref Range Status   Campylobacter species NOT DETECTED NOT DETECTED Final   Plesimonas shigelloides NOT DETECTED NOT DETECTED Final   Salmonella species NOT DETECTED NOT DETECTED Final   Yersinia enterocolitica NOT DETECTED NOT DETECTED Final   Vibrio species NOT DETECTED NOT DETECTED Final   Vibrio cholerae NOT DETECTED NOT DETECTED Final   Enteroaggregative E coli (EAEC) NOT DETECTED NOT DETECTED Final   Enteropathogenic E coli (EPEC) NOT DETECTED NOT DETECTED Final   Enterotoxigenic E coli (ETEC) NOT DETECTED NOT DETECTED Final   Shiga like toxin producing E coli (STEC) NOT DETECTED NOT DETECTED Final   Shigella/Enteroinvasive E coli (EIEC) NOT DETECTED NOT DETECTED Final   Cryptosporidium NOT DETECTED NOT DETECTED Final   Cyclospora cayetanensis NOT DETECTED NOT DETECTED Final   Entamoeba histolytica NOT DETECTED NOT DETECTED Final   Giardia lamblia NOT DETECTED NOT DETECTED Final   Adenovirus F40/41 NOT DETECTED NOT DETECTED Final  Astrovirus NOT DETECTED NOT DETECTED Final   Norovirus GI/GII NOT DETECTED NOT DETECTED Final   Rotavirus A NOT DETECTED NOT DETECTED Final   Sapovirus (I, II, IV, and V) NOT DETECTED NOT DETECTED Final    Comment: Performed at [facility], [address] [place], [place] [address]    Coagulation Studies: No results for input(s): LABPROT, INR in the last 72 hours.  Urinalysis: No results for input(s): COLORURINE, LABSPEC, PHURINE, GLUCOSEU, HGBUR, BILIRUBINUR, KETONESUR, PROTEINUR, UROBILINOGEN, NITRITE, LEUKOCYTESUR in the last 72  hours.  Invalid input(s): APPERANCEUR    Imaging: CT ABDOMEN PELVIS W CONTRAST Result Date: 11/10/2024 CLINICAL DATA:  Hernia repair 1 month ago. Surgical site is now red, swollen, and malodorous. Abdominal pain. Bleeding from surgical site. EXAM: CT ABDOMEN AND PELVIS WITH CONTRAST TECHNIQUE: Multidetector CT imaging of the abdomen and pelvis was performed using the standard protocol following bolus administration of intravenous contrast. RADIATION DOSE REDUCTION: This exam was performed according to the departmental dose-optimization program which includes automated exposure control, adjustment of the mA and/or kV according to patient size and/or use of iterative reconstruction technique. CONTRAST:  OMNIPAQUE  IOHEXOL  300 MG/ML  SOLN COMPARISON:  09/15/2024 FINDINGS: Lower chest: Collapse/consolidation of the right base is similar to prior. Interval improvement in left lower lobe aeration. Hepatobiliary: No suspicious focal abnormality within the liver parenchyma. There is no evidence for gallstones, gallbladder wall thickening, or pericholecystic fluid. No intrahepatic or extrahepatic biliary dilation. Pancreas: No focal mass lesion. No dilatation of the main duct. No intraparenchymal cyst. No peripancreatic edema. Spleen: No splenomegaly. No suspicious focal mass lesion. Adrenals/Urinary Tract: Stable adrenal thickening without mass lesion. Kidneys unremarkable. No evidence for hydroureter. The urinary bladder appears normal for the degree of distention. Stomach/Bowel: Tiny hiatal hernia. Stomach is nondistended. Duodenum is normally positioned as is the ligament of Treitz. No small bowel wall thickening. No small bowel dilatation. The terminal ileum is normal. The appendix is normal. No gross colonic mass. No colonic wall thickening. Fluid density is seen throughout the colonic lumen from the ascending segment to the distal rectum. Vascular/Lymphatic: There is moderate atherosclerotic  calcification of the abdominal aorta without aneurysm. There is no gastrohepatic or hepatoduodenal ligament lymphadenopathy. No retroperitoneal or mesenteric lymphadenopathy. No pelvic sidewall lymphadenopathy. Reproductive: Large calcified uterine fibroid again noted. There is no adnexal mass. Other: No intraperitoneal free fluid. Musculoskeletal: Pelvic floor laxity evident. Skin thickening noted anterior abdominal wall with diffuse subcutaneous edema/inflammation left greater than right in the upper abdomen in fairly symmetric more inferiorly in the anterior abdominal wall and pannus. At least 2 distinct rim enhancing fluid collections are evident, 1 of which measures 3.1 x 2.3 cm on axial image 54/4 and coronal image 105/8. Umbilical anatomy is not well demonstrated hindering localization relative to the umbilicus. The second collection is more inferior in the pannus and measures 7.5 x 6.6 x 4.3 cm (axial 78/4 and sagittal 93/8). No worrisome lytic or sclerotic osseous abnormality. IMPRESSION: 1. Skin thickening with diffuse subcutaneous edema/inflammation in the anterior abdominal wall left greater than right in the upper abdomen an fairly symmetric more inferiorly in the anterior abdominal wall and pannus. At least 2 distinct rim enhancing fluid collections are evident, 1 of which measures 3.1 x 2.3 cm and the second more inferior in the pannus measures 7.5 x 6.6 x 4.3 cm. Umbilicus is not well demonstrated on the study hindering localization of these collections relative to the umbilical anatomy. Imaging features are compatible with abscesses. 2. Fluid density  is seen throughout the colonic lumen from the ascending segment to the distal rectum. Imaging features can be seen in the setting of diarrhea. 3. Collapse/consolidation of the right base is similar to prior. Interval improvement in left lower lobe aeration. 4. Tiny hiatal hernia. 5. Large calcified uterine fibroid. 6. Pelvic floor laxity. 7.  Aortic  Atherosclerosis (ICD10-I70.0). Electronically Signed   By: Camellia Candle M.D.   On: 11/10/2024 09:36     Medications:    piperacillin -tazobactam (ZOSYN )  IV      allopurinol   300 mg Oral [id number]   amLODipine   5 mg Oral Daily   aspirin  EC  81 mg Oral Daily   heparin   5,000 Units Subcutaneous Q8H   insulin  aspart  0-6 Units Subcutaneous TID WC   [START ON 11/11/2024] midodrine   10 mg Oral Q M,W,F-HD   acetaminophen  **OR** acetaminophen , albuterol , furosemide , HYDROmorphone  (DILAUDID ) injection, iohexol , lactulose , oxyCODONE   Assessment/ Plan:  Brianna [name] is a 63 y.o.  female with past medical conditions including obstructive sleep apnea on CPAP, gout, GERD, type 2 diabetes, and end-stage renal disease on hemodialysis. Patient presents to ED with surgical site draining and has been admitted under observation for Abdominal wall abscess [L02.211]  [facility]/MWF/right IJ PermCath/left upper AVF.   Abdominal wall abscess, hernia repair site.  Original surgery took place on 09/13/2024.  Reported swelling and malodorous drainage.  Patient has been started on Zosyn  and vancomycin .  Surgery consulted and will continue to monitor with antibiotics  2.  End-stage renal disease on hemodialysis.  Last treatment received on Monday.  Will plan for scheduled dialysis tomorrow.  3. Anemia of chronic kidney disease Lab Results  Component Value Date   HGB 16.3 (H) 11/10/2024    Hemoglobin increased today.  Will continue to monitor.  4. Secondary Hyperparathyroidism: with outpatient labs: PTH 1325, phosphorus 3.7, calcium 7.8 on 10/20/2023.   Lab Results  Component Value Date   PTH 651 (H) 05/24/2023   CALCIUM 8.0 (L) 11/10/2024   CAION 1.11 (L) 08/08/2023   PHOS 4.2 10/14/2024    Will continue to monitor bone minerals during this admission.  Patient prescribed sevelamer  outpatient with meals.    LOS: 0 Franca Stakes 11/11/20251:44 PM

## 2024-11-10 NOTE — Consult Note (Addendum)
 [facility] SURGICAL CONSULTATION NOTE (initial) - cpt: 00756   HISTORY OF PRESENT ILLNESS (HPI):  63 y.o. female presented to [facility] ED today for evaluation of drainage from her laparotomy. Patient is well known to our service following exploratory laparotomy and reduction of incarcerated ventral hernia on 09/14 with Dr Marinda. She has done well considering. She reports that last night at 0200, she noticed what she thought was bloody drainage from the superior corner of her incision. She also reports that the surrounding skin is erythematous and firm as well. She denied any fevers. No significant abdominal pain, nausea, emesis. She continues to have bowel function. Work up in the ED revealed a normal WBC at 8.9K, Hgb to 16.3, BMP consistent with ESRD. She did have a CT Abdomen/Pelvis with pannicular inflammation as well as two enhancing fluid collections.   Surgery is consulted by emergency medicine physician Dr. Ronal Lewandowsky, MD in this context for evaluation and management of abdominal wall cellulitis and possible abscess.  PAST MEDICAL HISTORY (PMH):  Past Medical History:  Diagnosis Date   Acid reflux    Anasarca    Anemia    Asthma    well controlled   Bilateral leg edema    Bronchopneumonia    Chronic heart failure with preserved ejection fraction (HFpEF) (HCC)    a. 12/2020 Echo: EF 70-75%, no rwma, mild LVH, GrI DD, nl RV fxn, triv MR/AI.   CKD (chronic kidney disease), stage IV (HCC)    DM (diabetes mellitus), type 2 (HCC)    Dyspnea    Hypertension    Hypokalemia    Morbid obesity (HCC)    Strangulated ventral hernia      PAST SURGICAL HISTORY (PSH):  Past Surgical History:  Procedure Laterality Date   A/V FISTULAGRAM Left 01/28/2024   Procedure: A/V Fistulagram;  Surgeon: Jama Cordella MATSU, MD;  Location: [facility] INVASIVE CV [facility];  Service: Cardiovascular;  Laterality: Left;   A/V FISTULAGRAM Left 04/14/2024   Procedure: A/V Fistulagram;  Surgeon: Jama Cordella MATSU, MD;  Location: [facility];  Service: Cardiovascular;  Laterality: Left;   AV FISTULA PLACEMENT Left 08/08/2023   Procedure: ARTERIOVENOUS (AV) FISTULA CREATION (RADIALCEPHALIC);  Surgeon: Marea Selinda RAMAN, MD;  Location: [facility] ORS;  Service: Vascular;  Laterality: Left;  regional with MAC   BOWEL RESECTION  02/09/2020   Procedure: SMALL BOWEL RESECTION;  Surgeon: Desiderio Schanz, MD;  Location: [facility] ORS;  Service: General;;   DIALYSIS/PERMA CATHETER INSERTION N/A 05/28/2023   Procedure: DIALYSIS/PERMA CATHETER INSERTION;  Surgeon: Jama Cordella MATSU, MD;  Location: [facility] CV [facility];  Service: Cardiovascular;  Laterality: N/A;   LAPAROSCOPIC TRANSABDOMINAL HERNIA N/A 09/12/2024   Procedure: REPAIR, HERNIA, ABDOMINAL APPROACH;  Surgeon: Marinda Jayson KIDD, MD;  Location: [facility] ORS;  Service: General;  Laterality: N/A;   LAPAROTOMY N/A 02/09/2020   Procedure: EXPLORATORY LAPAROTOMY;  Surgeon: Desiderio Schanz, MD;  Location: [facility] OR[facility];  Service: General;  Laterality: N/A;   LYSIS OF ADHESION  09/12/2024   Procedure: LAPAROTOMY, FOR LYSIS OF ADHESIONS;  Surgeon: Marinda Jayson KIDD, MD;  Location: [facility] OR[facility];  Service: General;;   OMENTECTOMY  09/12/2024   Procedure: OMENTECTOMY;  Surgeon: Marinda Jayson KIDD, MD;  Location: [facility] OR[facility];  Service: General;;     MEDICATIONS:  Prior to Admission medications   Medication Sig Start Date End Date Taking? Authorizing Provider  albuterol  (PROVENTIL ) (2.5 MG/3ML) 0.083% nebulizer solution Take 2.5 mg by nebulization every 4 (four) hours as needed for shortness of  breath or wheezing. 12/21/19   [provider]  albuterol  (VENTOLIN  HFA) 108 (90 Base) MCG/ACT inhaler Inhale 1-2 puffs into the lungs every 6 (six) hours as needed for wheezing or shortness of breath.    [provider]  allopurinol  (ZYLOPRIM ) 300 MG tablet Take 300 mg by mouth every morning. 12/21/19   [provider]  amLODipine  (NORVASC ) 5 MG tablet Take 5 mg by mouth daily.  02/11/24   [provider]  ASPIRIN  ADULT LOW STRENGTH 81 MG EC tablet Take 81 mg by mouth daily. 12/21/19   [provider]  carvedilol  (COREG ) 12.5 MG tablet Take 1 tablet (12.5 mg total) by mouth 2 (two) times daily before a meal. 05/31/23   Laurita Pillion, MD  cetirizine (ZYRTEC) 10 MG tablet Take 10 mg by mouth every morning. 09/18/19   [provider]  cyanocobalamin  (VITAMIN B12) 1000 MCG tablet Take 1 tablet (1,000 mcg total) by mouth daily. 05/31/23   Laurita Pillion, MD  FEROSUL 325 (65 Fe) MG tablet Take 325 mg by mouth every other day. 05/22/23   [provider]  furosemide  (LASIX ) 80 MG tablet Take 1 tablet (80 mg total) by mouth daily. For leg swelling or weight gain >5lbs 01/19/21   Sebastian Toribio GAILS, MD  lactulose  (CHRONULAC ) 10 GM/15ML solution Take 10 g by mouth daily as needed for mild constipation. 03/09/24   [provider]  midodrine  (PROAMATINE ) 10 MG tablet Take 1 tablet (10 mg total) by mouth every dialysis (hypotension). 10/15/24   Amin, Sumayya, MD  omeprazole (PRILOSEC) 20 MG capsule Take 20 mg by mouth every morning. 12/21/19   [provider]  oxyCODONE  10 MG TABS Take 1 tablet (10 mg total) by mouth every 4 (four) hours as needed for severe pain (pain score 7-10). 09/24/24   Ayame Rena R, PA-C  OZEMPIC, 1 MG/DOSE, 4 MG/3ML SOPN Inject 1 mg into the skin once a week. 08/28/24   [provider]  sertraline  (ZOLOFT ) 25 MG tablet Take 25 mg by mouth daily. 01/23/24   [provider]  sevelamer  carbonate (RENVELA ) 800 MG tablet Take 1,600 mg by mouth 3 (three) times daily. 03/13/24   [provider]  traZODone (DESYREL) 50 MG tablet Take 50 mg by mouth at bedtime. 10/12/24   [provider]  triamcinolone ointment (KENALOG) 0.5 % Apply 1 Application topically 2 (two) times daily as needed. 07/01/23   [provider]  Vitamin D , Ergocalciferol , (DRISDOL) 1.25 MG (50000 UNIT) CAPS capsule  Take 50,000 Units by mouth once a week. 11/20/23   [provider]  ZETIA  10 MG tablet Take 10 mg by mouth every morning. 06/20/23   [provider]  zolpidem  (AMBIEN ) 5 MG tablet Take 1 tablet (5 mg total) by mouth at bedtime as needed for sleep. 01/19/21   Sebastian Toribio GAILS, MD     ALLERGIES:  Allergies  Allergen Reactions   Ace Inhibitors Anaphylaxis   Lisinopril Anaphylaxis   Lipitor [Atorvastatin] Other (See Comments)    Muscle aches     SOCIAL HISTORY:  Social History   Socioeconomic History   Marital status: Single    Spouse name: Not on file   Number of children: Not on file   Years of education: Not on file   Highest education level: Not on file  Occupational History   Not on file  Tobacco Use   Smoking status: Never    Passive exposure: Never   Smokeless tobacco: Never  Vaping  Use   Vaping status: Never Used  Substance and Sexual Activity   Alcohol use: Never   Drug use: Never   Sexual activity: Not Currently  Other Topics Concern   Not on file  Social History Narrative   Not on file   Social Drivers of Health   Financial Resource Strain: High Risk (06/04/2024)   Received from [facility]   Overall Financial Resource Strain (CARDIA)    Difficulty of Paying Living Expenses: Very hard  Food Insecurity: No Food Insecurity (10/12/2024)   Hunger Vital Sign    Worried About Running Out of Food in the Last Year: Never true    Ran Out of Food in the Last Year: Never true  Transportation Needs: No Transportation Needs (10/12/2024)   PRAPARE - Administrator, Civil Service (Medical): No    Lack of Transportation (Non-Medical): No  Physical Activity: Not on file  Stress: Not on file  Social Connections: Unknown (09/18/2024)   Social Connection and Isolation Panel    Frequency of Communication with Friends and Family: More than three times a week    Frequency of Social Gatherings with Friends and Family: More than  three times a week    Attends Religious Services: 1 to 4 times per year    Active Member of [employer] or Organizations: Yes    Attends Banker Meetings: 1 to 4 times per year    Marital Status: Patient declined  Intimate Partner Violence: Not At Risk (10/12/2024)   Humiliation, Afraid, Rape, and Kick questionnaire    Fear of Current or Ex-Partner: No    Emotionally Abused: No    Physically Abused: No    Sexually Abused: No     FAMILY HISTORY:  Family History  Problem Relation Age of Onset   Breast cancer Neg Hx       REVIEW OF SYSTEMS:  Review of Systems  Constitutional:  Negative for chills and fever.  Respiratory:  Negative for cough and shortness of breath.   Cardiovascular:  Negative for chest pain and palpitations.  Gastrointestinal:  Negative for abdominal pain, nausea and vomiting.  Genitourinary:  Negative for dysuria and urgency.  Skin:  Negative for itching and rash.       + Erythema (Abdominal Wall)  All other systems reviewed and are negative.   VITAL SIGNS:  Temp:  [99 F (37.2 C)] 99 F (37.2 C) (11/11 0629) Pulse Rate:  [90-94] 90 (11/11 1017) Resp:  [16-17] 16 (11/11 1017) BP: (133-147)/(64-81) 133/66 (11/11 1017) SpO2:  [94 %-97 %] 97 % (11/11 1017) Weight:  [92.1 kg] 92.1 kg (11/11 0630)     Height: 4' 9 (144.8 cm) Weight: 92.1 kg BMI (Calculated): 43.92   INTAKE/OUTPUT:  No intake/output data recorded.  PHYSICAL EXAM:  Physical Exam Vitals and nursing note reviewed. Exam conducted with a chaperone present.  Constitutional:      General: She is not in acute distress.    Appearance: Normal appearance. She is obese. She is not ill-appearing.     Comments: Resting in bed; NAD  HENT:     Head: Normocephalic and atraumatic.  Eyes:     General: No scleral icterus.    Conjunctiva/sclera: Conjunctivae normal.  Cardiovascular:     Rate and Rhythm: Normal rate.     Pulses: Normal pulses.  Pulmonary:     Effort: Pulmonary effort is normal.  No respiratory distress.  Abdominal:     General: Abdomen is protuberant. A  surgical scar is present. There is no distension.     Palpations: Abdomen is soft.     Tenderness: There is no abdominal tenderness. There is no guarding or rebound.     Comments: Abdomen is soft, to the superior portion of her laparotomy and to the right lateral aspect of this there is erythema and induration present. There is a ~1 cm opening to the superior corner of her laparotomy. I am able to express more seropurulent appearing fluid from this corner. Probing with q-tip revealed tracking to the right portion of this wound with continued expression of seropurulent fluid. There was no evidence of bilious output.   Genitourinary:    Comments: Deferred Skin:    General: Skin is warm and dry.     Findings: Erythema present.  Neurological:     General: No focal deficit present.     Mental Status: She is alert and oriented to person, place, and time.  Psychiatric:        Mood and Affect: Mood normal.        Behavior: Behavior normal.    Abdominal Wall Wound (11/10/2024):    Labs:     Latest Ref Rng & Units 11/10/2024    7:56 AM 10/15/2024    3:23 AM 10/14/2024    2:56 AM  CBC  WBC 4.0 - 10.5 K/uL 8.9  6.3  5.2   Hemoglobin 12.0 - 15.0 g/dL 83.6  8.5  7.3   Hematocrit 36.0 - 46.0 % 52.5  26.9  23.4   Platelets 150 - 400 K/uL 144  213  236       Latest Ref Rng & Units 11/10/2024    7:56 AM 10/15/2024    3:23 AM 10/14/2024    2:56 AM  CMP  Glucose 70 - 99 mg/dL 863  83  91   BUN 8 - 23 mg/dL 33  25  39   Creatinine 0.44 - 1.00 mg/dL 5.91  6.41  4.78   Sodium 135 - 145 mmol/L 139  131  136   Potassium 3.5 - 5.1 mmol/L 3.2  3.9  3.9   Chloride 98 - 111 mmol/L 97  95  96   CO2 22 - 32 mmol/L 26  26  26    Calcium 8.9 - 10.3 mg/dL 8.0  8.8  9.4   Total Protein 6.5 - 8.1 g/dL 7.3     Total Bilirubin 0.0 - 1.2 mg/dL 0.6     Alkaline Phos 38 - 126 U/L 110     AST 15 - 41 U/L 16     ALT 0 - 44 U/L 8         Imaging studies:   CT Abdomen/Pelvis (11/10/2024) personally reviewed with noted pannicular inflammation, two fluid collections noted, and radiologist report reviewed:  IMPRESSION: 1. Skin thickening with diffuse subcutaneous edema/inflammation in the anterior abdominal wall left greater than right in the upper abdomen an fairly symmetric more inferiorly in the anterior abdominal wall and pannus. At least 2 distinct rim enhancing fluid collections are evident, 1 of which measures 3.1 x 2.3 cm and the second more inferior in the pannus measures 7.5 x 6.6 x 4.3 cm. Umbilicus is not well demonstrated on the study hindering localization of these collections relative to the umbilical anatomy. Imaging features are compatible with abscesses. 2. Fluid density is seen throughout the colonic lumen from the ascending segment to the distal rectum. Imaging features can be seen in the setting of diarrhea. 3. Collapse/consolidation  of the right base is similar to prior. Interval improvement in left lower lobe aeration. 4. Tiny hiatal hernia. 5. Large calcified uterine fibroid. 6. Pelvic floor laxity. 7.  Aortic Atherosclerosis (ICD10-I70.0).   Assessment/Plan:  63 y.o. female with intra-abdominal fluid collection concerning for seroma vs infect seroma s/p exploratory laparotomy and reduction of ventral hernia on 09/14, complicated by pertinent comorbidities including ESRD.   - She is spontaneously draining from the superior aspect of her laparotomy which appears to be more seropurulent in nature, certainly not bilious. I do think this is most likely infected seroma vs seroma. I do not think this needs formal I&D as it is draining very well spontaneously. I did send Cx of this fluid. Wound was packed with saline moistened Kerlix gauze. I do think admission for 24-48 hours of IV Abx is reasonable. Appreciate medicine admission given comorbidities. We will follow along.   All of the above findings  and recommendations were discussed with the patient, and all of patient's questions were answered to her expressed satisfaction.  Thank you for the opportunity to participate in this patient's care.   -- [name], PA-C [facility] 11/10/2024, 11:22 AM M-F: 7am - 4pm

## 2024-11-10 NOTE — ED Notes (Signed)
 Fall bundle verified by this NT. Pt requested lunch tray, this tech and RN assured pt that lunch tray would be coming up from dietary shortly. Pt denies any further needs at this time.

## 2024-11-10 NOTE — H&P (Signed)
 History and Physical    Brianna [name] FMW:[id number] DOB: 01/21/1961 DOA: 11/10/2024  DOS: the patient was seen and examined on 11/10/2024  PCP: Cecily Katz, PA-C   Patient coming from: Home  I have personally briefly reviewed patient's old medical records in [facility]  Chief Complaint: Abdominal wall abscess drainage  HPI: Brianna [name] is a pleasant 63 y.o. female with medical history significant for diabetes not on any medications except Ozempic, ESRD on hemodialysis, CHF, HTN, HLD, recent strangulated hernia s/p laparoscopic repair who came into ED complaining of spontaneous drainage from her laparotomy site in her abdominal wall started this morning around 4 AM.  She had exploratory laparotomy and reduction of incarcerated ventral hernia on 9/14 with Dr. Marinda.  Patient denies any fever, chills, nausea, vomiting, abdominal pain, diarrhea, chest pain, palpitations, shortness of breath.  ED Course: Upon arrival to the ED, patient is found to be stable vital signs, WBC 8.9, hemoglobin 16.3, BMP consistent with ESRD.  CT abdomen and pelvis showed pannicular inflammation as well as 2 enhancing fluid collections.  Surgery was consulted for abdominal wall cellulitis and possible abscess with drainage.  Surgical team advised conservative management with antibiotics and requested for hospitalist to admit the patient.  Hospitalist team was consulted for evaluation for medical management of this patient.  Review of Systems:  ROS  All other systems negative except as noted in the HPI.  Past Medical History:  Diagnosis Date   Acid reflux    Anasarca    Anemia    Asthma    well controlled   Bilateral leg edema    Bronchopneumonia    Chronic heart failure with preserved ejection fraction (HFpEF) (HCC)    a. 12/2020 Echo: EF 70-75%, no rwma, mild LVH, GrI DD, nl RV fxn, triv MR/AI.   CKD (chronic kidney disease), stage IV (HCC)    DM (diabetes mellitus), type 2 (HCC)     Dyspnea    Hypertension    Hypokalemia    Morbid obesity (HCC)    Strangulated ventral hernia     Past Surgical History:  Procedure Laterality Date   A/V FISTULAGRAM Left 01/28/2024   Procedure: A/V Fistulagram;  Surgeon: Jama Cordella MATSU, MD;  Location: [facility];  Service: Cardiovascular;  Laterality: Left;   A/V FISTULAGRAM Left 04/14/2024   Procedure: A/V Fistulagram;  Surgeon: Jama Cordella MATSU, MD;  Location: [facility];  Service: Cardiovascular;  Laterality: Left;   AV FISTULA PLACEMENT Left 08/08/2023   Procedure: ARTERIOVENOUS (AV) FISTULA CREATION (RADIALCEPHALIC);  Surgeon: Marea Selinda RAMAN, MD;  Location: [facility] OR[facility];  Service: Vascular;  Laterality: Left;  regional with MAC   BOWEL RESECTION  02/09/2020   Procedure: SMALL BOWEL RESECTION;  Surgeon: Desiderio Schanz, MD;  Location: [facility] OR[facility];  Service: General;;   DIALYSIS/PERMA CATHETER INSERTION N/A 05/28/2023   Procedure: DIALYSIS/PERMA CATHETER INSERTION;  Surgeon: Jama Cordella MATSU, MD;  Location: [facility];  Service: Cardiovascular;  Laterality: N/A;   LAPAROSCOPIC TRANSABDOMINAL HERNIA N/A 09/12/2024   Procedure: REPAIR, HERNIA, ABDOMINAL APPROACH;  Surgeon: Marinda Jayson KIDD, MD;  Location: [facility];  Service: General;  Laterality: N/A;   LAPAROTOMY N/A 02/09/2020   Procedure: EXPLORATORY LAPAROTOMY;  Surgeon: Desiderio Schanz, MD;  Location: [facility];  Service: General;  Laterality: N/A;   LYSIS OF ADHESION  09/12/2024   Procedure: LAPAROTOMY, FOR LYSIS OF ADHESIONS;  Surgeon: Marinda Jayson KIDD, MD;  Location: [facility];  Service: General;;   OMENTECTOMY  09/12/2024   Procedure: OMENTECTOMY;  Surgeon: Marinda Jayson KIDD, MD;  Location: [facility];  Service: General;;     reports that she has never smoked. She has never been exposed to tobacco smoke. She has never used smokeless tobacco. She reports that she does not drink alcohol and does not use drugs.  Allergies  Allergen Reactions   Ace Inhibitors  Anaphylaxis   Lisinopril Anaphylaxis   Atorvastatin Other (See Comments)    Muscle aches    Family History  Problem Relation Age of Onset   Breast cancer Neg Hx     Prior to Admission medications   Medication Sig Start Date End Date Taking? Authorizing Provider  rosuvastatin (CRESTOR) 40 MG tablet Take 40 mg by mouth daily. 11/03/24  Yes [provider]  albuterol  (PROVENTIL ) (2.5 MG/3ML) 0.083% nebulizer solution Take 2.5 mg by nebulization every 4 (four) hours as needed for shortness of breath or wheezing. 12/21/19   [provider]  albuterol  (VENTOLIN  HFA) 108 (90 Base) MCG/ACT inhaler Inhale 1-2 puffs into the lungs every 6 (six) hours as needed for wheezing or shortness of breath.    [provider]  allopurinol  (ZYLOPRIM ) 300 MG tablet Take 300 mg by mouth every morning. 12/21/19   [provider]  amLODipine  (NORVASC ) 5 MG tablet Take 5 mg by mouth daily. 02/11/24   [provider]  ASPIRIN  ADULT LOW STRENGTH 81 MG EC tablet Take 81 mg by mouth daily. 12/21/19   [provider]  carvedilol  (COREG ) 12.5 MG tablet Take 1 tablet (12.5 mg total) by mouth 2 (two) times daily before a meal. 05/31/23   Laurita Pillion, MD  cetirizine (ZYRTEC) 10 MG tablet Take 10 mg by mouth every morning. 09/18/19   [provider]  cyanocobalamin  (VITAMIN B12) 1000 MCG tablet Take 1 tablet (1,000 mcg total) by mouth daily. 05/31/23   Laurita Pillion, MD  FEROSUL 325 (65 Fe) MG tablet Take 325 mg by mouth every other day. 05/22/23   [provider]  furosemide  (LASIX ) 80 MG tablet Take 1 tablet (80 mg total) by mouth daily. For leg swelling or weight gain >5lbs 01/19/21   Sebastian Toribio GAILS, MD  lactulose  (CHRONULAC ) 10 GM/15ML solution Take 10 g by mouth daily as needed for mild constipation. 03/09/24   [provider]  midodrine  (PROAMATINE ) 10 MG tablet Take 1 tablet (10 mg total) by mouth every dialysis (hypotension). 10/15/24   Amin,  Sumayya, MD  omeprazole (PRILOSEC) 20 MG capsule Take 20 mg by mouth every morning. 12/21/19   [provider]  oxyCODONE  10 MG TABS Take 1 tablet (10 mg total) by mouth every 4 (four) hours as needed for severe pain (pain score 7-10). 09/24/24   Schulz, Zachary R, PA-C  OZEMPIC, 1 MG/DOSE, 4 MG/3ML SOPN Inject 1 mg into the skin once a week. 08/28/24   [provider]  sertraline  (ZOLOFT ) 25 MG tablet Take 25 mg by mouth daily. 01/23/24   [provider]  sevelamer  carbonate (RENVELA ) 800 MG tablet Take 1,600 mg by mouth 3 (three) times daily. 03/13/24   [provider]  traZODone (DESYREL) 50 MG tablet Take 50 mg by mouth at bedtime. 10/12/24   [provider]  triamcinolone ointment (KENALOG) 0.5 % Apply 1 Application topically 2 (two) times daily as needed. 07/01/23   [provider]  Vitamin D , Ergocalciferol , (DRISDOL) 1.25 MG (50000 UNIT) CAPS capsule Take 50,000 Units by mouth once a week. 11/20/23   [provider]  ZETIA  10 MG tablet Take 10 mg by mouth every morning. 06/20/23   [provider]  zolpidem  (AMBIEN ) 5 MG tablet Take 1 tablet (5 mg total) by mouth at bedtime as needed for sleep. 01/19/21   Sebastian Toribio GAILS, MD    Physical Exam: Vitals:   11/10/24 0630 11/10/24 0700 11/10/24 0715 11/10/24 1017  BP:  (!) 147/81  133/66  Pulse:  92 93 90  Resp:  16  16  Temp:      TempSrc:      SpO2:  94% 96% 97%  Weight: 92.1 kg     Height: 4' 9 (1.448 m)       Physical Exam   Constitutional: Alert, awake, calm, comfortable HEENT: Neck supple Respiratory: Clear to auscultation B/L, no wheezing, no rales.  Cardiovascular: Regular rate and rhythm, no murmurs / rubs / gallops. No extremity edema. 2+ pedal pulses. No carotid bruits.  Abdomen: Soft, no tenderness, Bowel sounds positive.  Abdominal wall dressing is present. Musculoskeletal: no clubbing / cyanosis. Good ROM, no contractures. Normal muscle tone.  Skin:  no rashes, lesions, ulcers. Neurologic: CN 2-12 grossly intact. Sensation intact, No focal deficit identified Psychiatric: Alert and oriented x 3. Normal mood.    Labs on Admission: I have personally reviewed following labs and imaging studies  CBC: Recent Labs  Lab 11/10/24 0756  WBC 8.9  NEUTROABS 7.3  HGB 16.3*  HCT 52.5*  MCV 91.8  PLT 144*   Basic Metabolic Panel: Recent Labs  Lab 11/10/24 0756  NA 139  K 3.2*  CL 97*  CO2 26  GLUCOSE 136*  BUN 33*  CREATININE 4.08*  CALCIUM 8.0*   GFR: Estimated Creatinine Clearance: 13.4 mL/min (A) (by C-G formula based on SCr of 4.08 mg/dL (H)). Liver Function Tests: Recent Labs  Lab 11/10/24 0756  AST 16  ALT 8  ALKPHOS 110  BILITOT 0.6  PROT 7.3  ALBUMIN  2.9*   Recent Labs  Lab 11/10/24 0756  LIPASE 24   No results for input(s): AMMONIA in the last 168 hours. Coagulation Profile: No results for input(s): INR, PROTIME in the last 168 hours. Cardiac Enzymes: No results for input(s): CKTOTAL, CKMB, CKMBINDEX, TROPONINI, TROPONINIHS in the last 168 hours. BNP (last 3 results) Recent Labs    10/12/24 0536  BNP 113.0*   HbA1C: No results for input(s): HGBA1C in the last 72 hours. CBG: No results for input(s): GLUCAP in the last 168 hours. Lipid Profile: No results for input(s): CHOL, HDL, LDLCALC, TRIG, CHOLHDL, LDLDIRECT in the last 72 hours. Thyroid Function Tests: No results for input(s): TSH, T4TOTAL, FREET4, T3FREE, THYROIDAB in the last 72 hours. Anemia Panel: No results for input(s): VITAMINB12, FOLATE, FERRITIN, TIBC, IRON, RETICCTPCT in the last 72 hours. Urine analysis:    Component Value Date/Time   COLORURINE YELLOW (A) 05/28/2023 0921   APPEARANCEUR HAZY (A) 05/28/2023 0921   APPEARANCEUR Hazy 04/22/2013 1641   LABSPEC 1.014 05/28/2023 0921   LABSPEC 1.030 04/22/2013 1641   PHURINE 6.0 05/28/2023 0921   GLUCOSEU 50 (A) 05/28/2023 0921    GLUCOSEU >=500 04/22/2013 1641   HGBUR NEGATIVE 05/28/2023 0921   BILIRUBINUR NEGATIVE 05/28/2023 0921   BILIRUBINUR Negative 04/22/2013 1641   KETONESUR 5 (A) 05/28/2023 0921   PROTEINUR >=300 (A) 05/28/2023 0921   NITRITE NEGATIVE 05/28/2023 0921   LEUKOCYTESUR NEGATIVE 05/28/2023 0921   LEUKOCYTESUR Negative 04/22/2013 1641    Radiological Exams on Admission: I have personally reviewed images CT ABDOMEN PELVIS  W CONTRAST Result Date: 11/10/2024 CLINICAL DATA:  Hernia repair 1 month ago. Surgical site is now red, swollen, and malodorous. Abdominal pain. Bleeding from surgical site. EXAM: CT ABDOMEN AND PELVIS WITH CONTRAST TECHNIQUE: Multidetector CT imaging of the abdomen and pelvis was performed using the standard protocol following bolus administration of intravenous contrast. RADIATION DOSE REDUCTION: This exam was performed according to the departmental dose-optimization program which includes automated exposure control, adjustment of the mA and/or kV according to patient size and/or use of iterative reconstruction technique. CONTRAST:  OMNIPAQUE  IOHEXOL  300 MG/ML  SOLN COMPARISON:  09/15/2024 FINDINGS: Lower chest: Collapse/consolidation of the right base is similar to prior. Interval improvement in left lower lobe aeration. Hepatobiliary: No suspicious focal abnormality within the liver parenchyma. There is no evidence for gallstones, gallbladder wall thickening, or pericholecystic fluid. No intrahepatic or extrahepatic biliary dilation. Pancreas: No focal mass lesion. No dilatation of the main duct. No intraparenchymal cyst. No peripancreatic edema. Spleen: No splenomegaly. No suspicious focal mass lesion. Adrenals/Urinary Tract: Stable adrenal thickening without mass lesion. Kidneys unremarkable. No evidence for hydroureter. The urinary bladder appears normal for the degree of distention. Stomach/Bowel: Tiny hiatal hernia. Stomach is nondistended. Duodenum is normally positioned as  is the ligament of Treitz. No small bowel wall thickening. No small bowel dilatation. The terminal ileum is normal. The appendix is normal. No gross colonic mass. No colonic wall thickening. Fluid density is seen throughout the colonic lumen from the ascending segment to the distal rectum. Vascular/Lymphatic: There is moderate atherosclerotic calcification of the abdominal aorta without aneurysm. There is no gastrohepatic or hepatoduodenal ligament lymphadenopathy. No retroperitoneal or mesenteric lymphadenopathy. No pelvic sidewall lymphadenopathy. Reproductive: Large calcified uterine fibroid again noted. There is no adnexal mass. Other: No intraperitoneal free fluid. Musculoskeletal: Pelvic floor laxity evident. Skin thickening noted anterior abdominal wall with diffuse subcutaneous edema/inflammation left greater than right in the upper abdomen in fairly symmetric more inferiorly in the anterior abdominal wall and pannus. At least 2 distinct rim enhancing fluid collections are evident, 1 of which measures 3.1 x 2.3 cm on axial image 54/4 and coronal image 105/8. Umbilical anatomy is not well demonstrated hindering localization relative to the umbilicus. The second collection is more inferior in the pannus and measures 7.5 x 6.6 x 4.3 cm (axial 78/4 and sagittal 93/8). No worrisome lytic or sclerotic osseous abnormality. IMPRESSION: 1. Skin thickening with diffuse subcutaneous edema/inflammation in the anterior abdominal wall left greater than right in the upper abdomen an fairly symmetric more inferiorly in the anterior abdominal wall and pannus. At least 2 distinct rim enhancing fluid collections are evident, 1 of which measures 3.1 x 2.3 cm and the second more inferior in the pannus measures 7.5 x 6.6 x 4.3 cm. Umbilicus is not well demonstrated on the study hindering localization of these collections relative to the umbilical anatomy. Imaging features are compatible with abscesses. 2. Fluid density is seen  throughout the colonic lumen from the ascending segment to the distal rectum. Imaging features can be seen in the setting of diarrhea. 3. Collapse/consolidation of the right base is similar to prior. Interval improvement in left lower lobe aeration. 4. Tiny hiatal hernia. 5. Large calcified uterine fibroid. 6. Pelvic floor laxity. 7.  Aortic Atherosclerosis (ICD10-I70.0). Electronically Signed   By: Camellia Candle M.D.   On: 11/10/2024 09:36    EKG: N/A    Assessment/Plan Principal Problem:   Abdominal wall abscess Active Problems:   (HFpEF) heart failure with preserved ejection fraction (HCC)  Controlled type 2 diabetes mellitus without complication, without long-term current use of insulin  (HCC)   Hypertension   ESRD on hemodialysis [facility])    Assessment and Plan: 63 year old female W/PMH of ESRD on hemodialysis Monday Wednesday Friday, HTN, HF PEF, DM not on any medications except Ozempic, recent history of incarcerated ventral hernia s/p exploratory laparotomy in September 2025 now presented with draining abscess from that wound.  1.  Abdominal wall abscess from ventral hernia repair site - She will be placed in observation - Will be started on Zosyn  and vancomycin  due to concern for complications in the setting of diabetes and other medical problems - Surgical team consultation been obtained from ED.  Will follow their recommendations - Pain medications, cultures, dressing  2.  Type 2 diabetes not on regular medications - She will be on Ozempic at home - While she is in the hospital we will place her on sliding scale  3.  ESRD on hemodialysis - Nephrology will be consulted for evaluation for dialysis  4.  HTN - Stable - Resume home medications  5.  HFpEF - Stable - Resume home medications Lasix  80 mg p.o. daily. - It appears that patient is not taking Coreg  at home.  6.  Hypokalemia - Will replace potassium - Will check potassium level in the morning    DVT  prophylaxis: SQ Heparin  Code Status: Full Code Family Communication: None Disposition Plan: Home Consults called: General Surgery/nephrology Admission status: Observation, Med-Surg   Nena Rebel, MD [facility] 11/10/2024, 11:57 AM

## 2024-11-10 NOTE — Consult Note (Addendum)
 Pharmacy Antibiotic Note  Brianna [name] is a 63 y.o. female with a PMH of ESRD on HD MWF, T2DM, ventral hernia s/p repair on 9/14 admitted on 11/10/2024 for evaluation of intra-abdominal fluid drainageconcerning for seroma.  Pharmacy has been consulted for Vancomycin  dosing.  Plan: Vancomycin  2000 mg LD given @1014  on 11/11. Pharmacy will follow inpatient HD plans in order to schedule subsequent doses after HD. Will monitor cultures and follow for antibiotic therapy plans.   Height: 4' 9 (144.8 cm) Weight: 92.1 kg (203 lb) IBW/kg (Calculated) : 38.6  Temp (24hrs), Avg:99 F (37.2 C), Min:99 F (37.2 C), Max:99 F (37.2 C)  Recent Labs  Lab 11/10/24 0756  WBC 8.9  CREATININE 4.08*    Estimated Creatinine Clearance: 13.4 mL/min (A) (by C-G formula based on SCr of 4.08 mg/dL (H)).    Allergies  Allergen Reactions   Ace Inhibitors Anaphylaxis   Lisinopril Anaphylaxis   Atorvastatin Other (See Comments)    Muscle aches    Antimicrobials this admission: 11/11 Vancomycin  >>  11/11 Zosyn  2.25 q8h >>    Microbiology results: 11/11 Surgical deep wound: pending  Thank you for allowing pharmacy to be a part of this patient's care. Annabella LOISE Banks, PharmD Clinical Pharmacist 11/10/2024 11:57 AM

## 2024-11-10 NOTE — Progress Notes (Incomplete)
 C-Diff rule out ordered and sent to lab; Enteric precautions in place.

## 2024-11-10 NOTE — ED Notes (Signed)
Pt given new warm blankets 

## 2024-11-10 NOTE — Consult Note (Signed)
 WOC Nurse Consult Note: patient with history of hernia repair 09/13/2024; presenting with area of wound dehiscence; seen by Dr. Marinda surgeon and EUSTACE Platt, PA who packed wound with saline moistened gauze  Reason for Consult: abdominal wound  Wound type: full thickness surgical wound dehiscence  Pressure Injury POA: NA  Measurement: see nursing flowsheet; per surgical note approximately 1 cm in diameter  Wound bed: unable to visualize wound bed due to depth  Drainage (amount, consistency, odor) seropurulent per MD note  Periwound: erythema and induration; healing surgical scar  Dressing procedure/placement/frequency: Using a Q tip applicator insert saline moistened Kerlix roll gauze into wound bed daily leaving a significant portion hanging from wound for easy removal.  Cover with dry gauze/ABD pad and clothe tape. Change outer dressing as needed for drainage saturation.    These wound care orders are provided for bedside nurse to perform daily.  Patient is being followed by surgeon and any wound care orders placed by surgeon supercede wound care orders placed by [facility] RN.   POC discussed with bedside nurse. WOC team will not follow. Re-consult if further needs arise.   Thank you,    Powell Bar MSN, RN-BC, [employer]

## 2024-11-10 NOTE — ED Notes (Signed)
 NURSE JACOB INFORMED OF ASSIGNED BED

## 2024-11-10 NOTE — ED Triage Notes (Signed)
 Pt BIB [facility] from home for bleeding from surgical site. Pt had hernia repair approx 1 month ago. Surgical site is red, swollen and malodorous. Pt estimates 1 pint of blood loss. EMS VS 168 92 102 18 98% CBG=172

## 2024-11-10 NOTE — ED Provider Notes (Signed)
 [facility] Provider Note    Event Date/Time   First MD Initiated Contact with Patient 11/10/24 [phone]     (approximate)   History   Wound Dehiscence   HPI  Brianna [name] is a 63 year old female with history of T2DM, ESRD, ventral hernia s/p repair in September presenting to the emergency department for evaluation of abdominal drainage.  Patient reports that a few days ago she noticed some redness over her abdominal wall.  Today noticed drainage from the site.  No fevers.  Reviewed DC summary from 10/15/2024.  Admitted for shortness of breath in the setting of COVID-19 infection at that time.  Reviewed discharge summary from 9/25, underwent ex lap with reduction of incarcerated and obstructed hernia and repair of ventral hernia on 09/13/2024 with Dr. Marinda.      Physical Exam   Triage Vital Signs: ED Triage Vitals  Encounter Vitals Group     BP 11/10/24 0629 139/64     Girls Systolic BP Percentile --      Girls Diastolic BP Percentile --      Boys Systolic BP Percentile --      Boys Diastolic BP Percentile --      Pulse Rate 11/10/24 0629 94     Resp 11/10/24 0629 17     Temp 11/10/24 0629 99 F (37.2 C)     Temp Source 11/10/24 0629 Oral     SpO2 11/10/24 0629 96 %     Weight 11/10/24 0630 203 lb (92.1 kg)     Height 11/10/24 0630 4' 9 (1.448 m)     Head Circumference --      Peak Flow --      Pain Score 11/10/24 0630 6     Pain Loc --      Pain Education --      Exclude from Growth Chart --     Most recent vital signs: Vitals:   11/10/24 0629  BP: 139/64  Pulse: 94  Resp: 17  Temp: 99 F (37.2 C)  SpO2: 96%     General: Awake, interactive  CV:  Good peripheral perfusion Resp:  Unlabored respirations Abd:  Soft, erythema and swelling of the abdominal wall superior to recent incision site. Pinpoint area of drainage of serosanguinous fluid. Incision appears well healing. See images below.  Neuro:  Symmetric facial movement,  fluid speech        ED Results / Procedures / Treatments   Labs (all labs ordered are listed, but only abnormal results are displayed) Labs Reviewed  CBC WITH DIFFERENTIAL/PLATELET  COMPREHENSIVE METABOLIC PANEL WITH GFR  LIPASE, BLOOD     EKG EKG independently reviewed and interpreted by myself demonstrates:    RADIOLOGY Imaging independently reviewed and interpreted by myself demonstrates:   Formal Radiology Read:  No results found.  PROCEDURES:  Critical Care performed: No  Procedures   MEDICATIONS ORDERED IN ED: Medications  morphine  (PF) 2 MG/ML injection 2 mg (has no administration in time range)     IMPRESSION / MDM / ASSESSMENT AND PLAN / ED COURSE  I reviewed the triage vital signs and the nursing notes.  Differential diagnosis includes, but is not limited to, postop seroma, abscess, abdominal wall cellulitis cellulitis, other postoperative complication  Patient's presentation is most consistent with acute presentation with potential threat to life or bodily function.  63 year old female presenting with erythema and swelling for abdominal wall with area of drainage.  About 2 months postop from open hernia  repair.  Stable vitals.  Will obtain labs, CT scan to further evaluate.  Clinical Course as of 11/10/24 0713  Tue Nov 10, 2024  0711 Signed out oncoming physician pending labs, CT, and disposition.  [NR]    Clinical Course User Index [NR] Levander Slate, MD     FINAL CLINICAL IMPRESSION(S) / ED DIAGNOSES   Final diagnoses:  Abdominal wall cellulitis  Drainage from wound     Rx / DC Orders   ED Discharge Orders     None        Note:  This document was prepared using Dragon voice recognition software and may include unintentional dictation errors.   Levander Slate, MD 11/10/24 [phone]

## 2024-11-11 DIAGNOSIS — B9689 Other specified bacterial agents as the cause of diseases classified elsewhere: Secondary | ICD-10-CM | POA: Diagnosis present

## 2024-11-11 DIAGNOSIS — L03311 Cellulitis of abdominal wall: Secondary | ICD-10-CM | POA: Diagnosis present

## 2024-11-11 DIAGNOSIS — E1122 Type 2 diabetes mellitus with diabetic chronic kidney disease: Secondary | ICD-10-CM | POA: Diagnosis present

## 2024-11-11 DIAGNOSIS — J4489 Other specified chronic obstructive pulmonary disease: Secondary | ICD-10-CM | POA: Diagnosis present

## 2024-11-11 DIAGNOSIS — Z6841 Body Mass Index (BMI) 40.0 and over, adult: Secondary | ICD-10-CM | POA: Diagnosis not present

## 2024-11-11 DIAGNOSIS — Z79899 Other long term (current) drug therapy: Secondary | ICD-10-CM | POA: Diagnosis not present

## 2024-11-11 DIAGNOSIS — T8149XA Infection following a procedure, other surgical site, initial encounter: Secondary | ICD-10-CM | POA: Diagnosis present

## 2024-11-11 DIAGNOSIS — N186 End stage renal disease: Secondary | ICD-10-CM | POA: Diagnosis present

## 2024-11-11 DIAGNOSIS — I5032 Chronic diastolic (congestive) heart failure: Secondary | ICD-10-CM | POA: Diagnosis present

## 2024-11-11 DIAGNOSIS — T8140XA Infection following a procedure, unspecified, initial encounter: Secondary | ICD-10-CM | POA: Diagnosis not present

## 2024-11-11 DIAGNOSIS — E66812 Obesity, class 2: Secondary | ICD-10-CM | POA: Diagnosis present

## 2024-11-11 DIAGNOSIS — Z794 Long term (current) use of insulin: Secondary | ICD-10-CM | POA: Diagnosis not present

## 2024-11-11 DIAGNOSIS — L02211 Cutaneous abscess of abdominal wall: Secondary | ICD-10-CM | POA: Diagnosis present

## 2024-11-11 DIAGNOSIS — E785 Hyperlipidemia, unspecified: Secondary | ICD-10-CM | POA: Diagnosis present

## 2024-11-11 DIAGNOSIS — D631 Anemia in chronic kidney disease: Secondary | ICD-10-CM | POA: Diagnosis present

## 2024-11-11 DIAGNOSIS — T8131XA Disruption of external operation (surgical) wound, not elsewhere classified, initial encounter: Secondary | ICD-10-CM | POA: Diagnosis present

## 2024-11-11 DIAGNOSIS — M109 Gout, unspecified: Secondary | ICD-10-CM | POA: Diagnosis present

## 2024-11-11 DIAGNOSIS — Z992 Dependence on renal dialysis: Secondary | ICD-10-CM

## 2024-11-11 DIAGNOSIS — Z7985 Long-term (current) use of injectable non-insulin antidiabetic drugs: Secondary | ICD-10-CM | POA: Diagnosis not present

## 2024-11-11 DIAGNOSIS — Z8616 Personal history of COVID-19: Secondary | ICD-10-CM | POA: Diagnosis not present

## 2024-11-11 DIAGNOSIS — G4733 Obstructive sleep apnea (adult) (pediatric): Secondary | ICD-10-CM | POA: Diagnosis present

## 2024-11-11 DIAGNOSIS — Y838 Other surgical procedures as the cause of abnormal reaction of the patient, or of later complication, without mention of misadventure at the time of the procedure: Secondary | ICD-10-CM | POA: Diagnosis present

## 2024-11-11 DIAGNOSIS — I132 Hypertensive heart and chronic kidney disease with heart failure and with stage 5 chronic kidney disease, or end stage renal disease: Secondary | ICD-10-CM | POA: Diagnosis present

## 2024-11-11 DIAGNOSIS — K219 Gastro-esophageal reflux disease without esophagitis: Secondary | ICD-10-CM | POA: Diagnosis present

## 2024-11-11 DIAGNOSIS — Z7982 Long term (current) use of aspirin: Secondary | ICD-10-CM | POA: Diagnosis not present

## 2024-11-11 DIAGNOSIS — I10 Essential (primary) hypertension: Secondary | ICD-10-CM | POA: Diagnosis not present

## 2024-11-11 DIAGNOSIS — E876 Hypokalemia: Secondary | ICD-10-CM | POA: Diagnosis present

## 2024-11-11 LAB — GLUCOSE, CAPILLARY
Glucose-Capillary: 120 mg/dL — ABNORMAL HIGH (ref 70–99)
Glucose-Capillary: 90 mg/dL (ref 70–99)
Glucose-Capillary: 114 mg/dL — ABNORMAL HIGH (ref 70–99)
Glucose-Capillary: 134 mg/dL — ABNORMAL HIGH (ref 70–99)
Glucose-Capillary: 106 mg/dL — ABNORMAL HIGH (ref 70–99)

## 2024-11-11 LAB — COMPREHENSIVE METABOLIC PANEL WITH GFR
ALT: <5 U/L (ref 0–44)
AST: 12 U/L — ABNORMAL LOW (ref 15–41)
Albumin: 3.1 g/dL — ABNORMAL LOW (ref 3.5–5.0)
Alkaline Phosphatase: 127 U/L — ABNORMAL HIGH (ref 38–126)
Anion gap: 16 — ABNORMAL HIGH (ref 5–15)
BUN: 41 mg/dL — ABNORMAL HIGH (ref 8–23)
CO2: 25 mmol/L (ref 22–32)
Calcium: 8.3 mg/dL — ABNORMAL LOW (ref 8.9–10.3)
Chloride: 98 mmol/L (ref 98–111)
Creatinine, Ser: 5 mg/dL — ABNORMAL HIGH (ref 0.44–1.00)
GFR, Estimated: 9 mL/min — ABNORMAL LOW (ref >60)
Glucose, Bld: 120 mg/dL — ABNORMAL HIGH (ref 70–99)
Potassium: 3.6 mmol/L (ref 3.5–5.1)
Sodium: 139 mmol/L (ref 135–145)
Total Bilirubin: <0.2 mg/dL (ref 0.0–1.2)
Total Protein: 6.3 g/dL — ABNORMAL LOW (ref 6.5–8.1)

## 2024-11-11 LAB — CBC
HCT: 30.3 % — ABNORMAL LOW (ref 36.0–46.0)
Hemoglobin: 9.3 g/dL — ABNORMAL LOW (ref 12.0–15.0)
MCH: 29 pg (ref 26.0–34.0)
MCHC: 30.7 g/dL (ref 30.0–36.0)
MCV: 94.4 fL (ref 80.0–100.0)
Platelets: 277 K/uL (ref 150–400)
RBC: 3.21 MIL/uL — ABNORMAL LOW (ref 3.87–5.11)
RDW: 15.5 % (ref 11.5–15.5)
WBC: 11.4 K/uL — ABNORMAL HIGH (ref 4.0–10.5)
nRBC: 0 % (ref 0.0–0.2)

## 2024-11-11 LAB — PROTIME-INR
INR: 1.2 (ref 0.8–1.2)
Prothrombin Time: 15.6 s — ABNORMAL HIGH (ref 11.4–15.2)

## 2024-11-11 MED ORDER — FLUTICASONE FUROATE-VILANTEROL 200-25 MCG/ACT IN AEPB
1.0000 | INHALATION_SPRAY | Freq: Every day | RESPIRATORY_TRACT | Status: DC
  Administered 2024-11-12: 1 via RESPIRATORY_TRACT
  Filled 2024-11-11: qty 28

## 2024-11-11 MED ORDER — VANCOMYCIN HCL IN DEXTROSE 1-5 GM/200ML-% IV SOLN
1000.0000 mg | INTRAVENOUS | Status: DC
  Administered 2024-11-11: 1000 mg via INTRAVENOUS
  Filled 2024-11-11: qty 200

## 2024-11-11 MED ORDER — ALLOPURINOL 100 MG PO TABS
100.0000 mg | ORAL_TABLET | ORAL | Status: DC
  Administered 2024-11-11: 100 mg via ORAL
  Filled 2024-11-11: qty 1

## 2024-11-11 MED ORDER — HEPARIN SODIUM (PORCINE) 1000 UNIT/ML IJ SOLN
INTRAMUSCULAR | Status: AC
  Filled 2024-11-11: qty 4

## 2024-11-11 NOTE — Progress Notes (Addendum)
  Progress Note   Patient: Brianna [name] FMW:[id number] DOB: 26-Dec-1961 DOA: 11/10/2024     0 DOS: the patient was seen and examined on 11/11/2024   Brief hospital course:  Brianna [name] is a pleasant 63 y.o. female with medical history significant for diabetes not on any medications except Ozempic, ESRD on hemodialysis, CHF, HTN, HLD, recent strangulated hernia s/p laparoscopic repair who came into ED complaining of spontaneous drainage from her laparotomy site in her abdominal wall started this morning around 4 AM.  She had exploratory laparotomy and reduction of incarcerated ventral hernia on 9/14 with Dr. Marinda.  Patient is seen by general surgery, started on antibiotics with vancomycin  and Zosyn  for wound infection.    Principal Problem:   Abdominal wall abscess Active Problems:   (HFpEF) heart failure with preserved ejection fraction (HCC)   Controlled type 2 diabetes mellitus without complication, without long-term current use of insulin  (HCC)   Hypertension   ESRD on hemodialysis (HCC)   Assessment and Plan: Abdominal wall abscess secondary to surgical wound infection. General surgery is following.  Continue antibiotics with Zosyn  and vancomycin .  Wound culture grow gram-positive as well as gram-negative bacteria.  Will continue to follow.   Type 2 diabetes Continue sliding scale insulin .    ESRD on hemodialysis Hypokalemia. Followed by nephrology for dialysis.  Essential hypertension. - Resume home medications   Chronic diastolic congestive heart failure. No exacerbation.   Class II obesity with BMI 39.79, Morbid obesity due to comorbidities. Diet and exercise.  Likely COPD. Patient has labored breathing, short of breath with exertion, most likely COPD.  Add Breo.     Subjective:  Patient doing well, still has some drain from the wound.  No fever or chills.  No nausea vomiting. She has baseline shortness of breath at rest.  Physical  Exam: Vitals:   11/11/24 0806 11/11/24 1330 11/11/24 1352 11/11/24 1400  BP: (!) 148/68 130/77 130/70 123/65  Pulse: 85 87 81 83  Resp: 17 (!) 23 (!) 29 (!) 22  Temp: 98.8 F (37.1 C) 98.5 F (36.9 C)    TempSrc:  Oral    SpO2: 97%  98% 96%  Weight:  83.4 kg    Height:       General exam: Appears calm and comfortable, morbid obese Respiratory system: Clear to auscultation. Respiratory effort normal. Cardiovascular system: S1 & S2 heard, RRR. No JVD, murmurs, rubs, gallops or clicks. No pedal edema. Gastrointestinal system: Abdomen is nondistended, soft and nontender. No organomegaly or masses felt. Normal bowel sounds heard. Central nervous system: Alert and oriented. No focal neurological deficits. Extremities: Symmetric 5 x 5 power. Skin: No rashes, lesions or ulcers Psychiatry: Judgement and insight appear normal. Mood & affect appropriate.    Data Reviewed:  Lab results reviewed.  Family Communication: Cousin updated over the phone.  Disposition: Status is: Inpatient Remains inpatient appropriate because: Severity of disease, IV treatment.     Time spent: 35 minutes  Author: Murvin Mana, MD 11/11/2024 2:23 PM  For on call review [email or web address].

## 2024-11-11 NOTE — Progress Notes (Signed)
 [facility] SURGICAL PROGRESS NOTE (cpt [phone])  Hospital Day(s): 0.   Interval History: Patient seen and examined, no acute events or new complaints overnight. Patient reports she is doing well. She denied any significant pain. No fever, chills. She does have leukocytosis this AM; WBC 11.4K. Hgb to 9.3 (from 16.3); I do not think she is bleeding. CMP consistent with ESRD on HD. Cx from wound with GPC in pairs in chains and GNR. She is on Zosyn .   Review of Systems:  Constitutional: denies fever, chills  HEENT: denies cough or congestion  Respiratory: denies any shortness of breath  Cardiovascular: denies chest pain or palpitations  Gastrointestinal: denies abdominal pain, N/V Genitourinary: denies burning with urination or urinary frequency Musculoskeletal: denies pain, decreased motor or sensation Integumentary: + Cellulitis (improving)  Vital signs in last 24 hours: [min-max] current  Temp:  [98.1 F (36.7 C)-99 F (37.2 C)] 98.1 F (36.7 C) (11/12 0638) Pulse Rate:  [81-91] 81 (11/12 0638) Resp:  [16-18] 18 (11/12 0638) BP: (120-145)/(64-82) 143/68 (11/12 0638) SpO2:  [95 %-99 %] 98 % (11/12 9361)     Height: 4' 9 (144.8 cm) Weight: 92.1 kg BMI (Calculated): 43.92   Intake/Output last 2 shifts:  11/11 0701 - 11/12 0700 In: 340 [P.O.:240; IV Piggyback:100] Out: -    Physical Exam:  Constitutional: alert, cooperative and no distress  HENT: normocephalic without obvious abnormality  Eyes: PERRL, EOM's grossly intact and symmetric  Respiratory: breathing non-labored at rest  Cardiovascular: regular rate and sinus rhythm  Gastrointestinal: soft, non-tender, and non-distended. Erythema to the superior portion of her laparotomy and to the right lateral aspect of this is improved; induration remains expectedly. There is a ~1 cm opening to the superior corner of her laparotomy. No significant drainage expressible but certainly still seropurulent on dressing this AM.   Probing with q-tip revealed tracking to the right portion of this wound. There was no evidence of bilious output.      Labs:     Latest Ref Rng & Units 11/11/2024    5:24 AM 11/10/2024    7:56 AM 10/15/2024    3:23 AM  CBC  WBC 4.0 - 10.5 K/uL 11.4  8.9  6.3   Hemoglobin 12.0 - 15.0 g/dL 9.3  83.6  8.5   Hematocrit 36.0 - 46.0 % 30.3  52.5  26.9   Platelets 150 - 400 K/uL 277  144  213       Latest Ref Rng & Units 11/11/2024    5:24 AM 11/10/2024    7:56 AM 10/15/2024    3:23 AM  CMP  Glucose 70 - 99 mg/dL 879  863  83   BUN 8 - 23 mg/dL 41  33  25   Creatinine 0.44 - 1.00 mg/dL 4.99  5.91  6.41   Sodium 135 - 145 mmol/L 139  139  131   Potassium 3.5 - 5.1 mmol/L 3.6  3.2  3.9   Chloride 98 - 111 mmol/L 98  97  95   CO2 22 - 32 mmol/L 25  26  26    Calcium 8.9 - 10.3 mg/dL 8.3  8.0  8.8   Total Protein 6.5 - 8.1 g/dL 6.3  7.3    Total Bilirubin 0.0 - 1.2 mg/dL <9.7  0.6    Alkaline Phos 38 - 126 U/L 127  110    AST 15 - 41 U/L 12  16    ALT 0 - 44 U/L <5  8  Imaging studies: No new pertinent imaging studies   Assessment/Plan:  63 y.o. female with abdominal wall fluid collection concerning for seroma vs infect seroma vs abscess s/p exploratory laparotomy and reduction of ventral hernia on 09/14, complicated by pertinent comorbidities including ESRD.    - Wound Care: Pack wound with saline (or Vase) moistened rolled gauze, cover, and secure. I do think this should be done BID while in house. Can transition to once daily at home.  - Continue IV Abx (Zosyn ): Follow up Cx   - Monitor abdominal examination   - Okay to continue diet as tolerated   - I will place order for home health for dressing changes as I am not sure she will manage on her own.   - Appreciate nephrology assistance for HD - Further management per primary service; we will follow     - Discharge Planning: I think she will benefit from another 24 hours of IV Abx, allow for Cx to result, and establish  home plan for wound care.   All of the above findings and recommendations were discussed with the patient, and the medical team, and all of patient's questions were answered to her expressed satisfaction.   -- [name], PA-C [facility] 11/11/2024, 7:23 AM M-F: 7am - 4pm

## 2024-11-11 NOTE — Progress Notes (Signed)
 Pharmacy Antibiotic Note  Brianna [name] is a 63 y.o. female admitted on 11/10/2024 with wound infection from recent wound infection. Patient presented to ED with surgical site bleeding after abdominal wall surgery on 9/13. PMH significant for OSA on CPAP, gout, GERD, T2DM, and ESRD on HD MWF. Pharmacy has been consulted for vancomycin  dosing.   Nephrology planning to resume PTA hemodialysis schedule. Last session on 11/10. Nephro scheduled next session on 11/12.  Plan: Vancomycin  load of 2000 mg IV x1 given in ED Start vancomycin  1000 mg IV every Monday/Wednesday/Friday after HD Continue to monitor clinical status, culture data, and antibiotic LOT F/u any changes in HD schedule  Height: 4' 9 (144.8 cm) Weight: 92.1 kg (203 lb) IBW/kg (Calculated) : 38.6  Temp (24hrs), Avg:98.4 F (36.9 C), Min:98.1 F (36.7 C), Max:99 F (37.2 C)  Recent Labs  Lab 11/10/24 0756 11/11/24 0524  WBC 8.9 11.4*  CREATININE 4.08* 5.00*    Estimated Creatinine Clearance: 10.9 mL/min (A) (by C-G formula based on SCr of 5 mg/dL (H)).    Allergies  Allergen Reactions   Ace Inhibitors Anaphylaxis   Lisinopril Anaphylaxis   Atorvastatin Other (See Comments)    Muscle aches    Antimicrobials this admission: vancomycin  11/11 >>  Zosyn  11/11 >>   Dose adjustments this admission: N/A  Microbiology results: 11/11 Abscess cx: rare GNR, Rare GPCs  Thank you for involving pharmacy in this patient's care.   Damien Napoleon, PharmD Clinical Pharmacist 11/11/2024 9:54 AM

## 2024-11-11 NOTE — Plan of Care (Signed)
  Problem: Nutritional: Goal: Maintenance of adequate nutrition will improve Outcome: Progressing   Problem: Health Behavior/Discharge Planning: Goal: Ability to manage health-related needs will improve Outcome: Progressing   Problem: Education: Goal: Knowledge of General Education information will improve Description: Including pain rating scale, medication(s)/side effects and non-pharmacologic comfort measures Outcome: Progressing   Problem: Activity: Goal: Risk for activity intolerance will decrease Outcome: Progressing

## 2024-11-11 NOTE — Progress Notes (Signed)
   11/11/24 0930  Spiritual Encounters  Type of Visit Initial  Care provided to: Patient  Conversation partners present during encounter Nurse  Referral source Nurse (RN/NT/LPN)  Reason for visit Routine spiritual support  OnCall Visit Yes   Chaplain visited patient at the suggestion of staff. Chaplain and patient spoke about the patient's health concerns and how her faith is getting her through.  Patient requested prayer and Chaplain prayed.  Patient was happy to know Chaplains are available and was grateful for the visit.    Rev. Rana M. Nicholaus, M.Div. Chaplain Resident  [facility]

## 2024-11-11 NOTE — Progress Notes (Signed)
  Received patient in bed to unit.   Informed consent signed and in chart.    TX duration:2:55  pt signed off 35 min early     Transported back to floor  Hand-off given to patient's nurse. Pt c/o lightheadedness even after fluid given and uf turned off.     Access used: R HD Catheter  Access issues: none   Total UF removed: 0.7L Medication(s) given: none Post HD VS: wnl      Olivia Hurst LPN [facility]

## 2024-11-11 NOTE — Progress Notes (Signed)
 [facility]  ROUNDING NOTE   Subjective:   Brianna [name] is a 63 year old female with past medical conditions including obstructive sleep apnea on CPAP, gout, GERD, type 2 diabetes, and end-stage renal disease on hemodialysis. Patient presents to ED with surgical sire bleeding and has been admitted under observation for Abdominal wall abscess [L02.211] Abdominal wall cellulitis [L03.311] Drainage from wound [T14.8XXA]  Patient is known to our practice and receives outpatient dialysis treatments on MWF at [facility], supervised by Dr marcelino.  Patient seen sitting at side of bed Room air Denies pain from wound   Objective:  Vital signs in last 24 hours:  Temp:  [98.1 F (36.7 C)-99 F (37.2 C)] 98.8 F (37.1 C) (11/12 0806) Pulse Rate:  [81-91] 85 (11/12 0806) Resp:  [16-18] 17 (11/12 0806) BP: (120-148)/(64-82) 148/68 (11/12 0806) SpO2:  [95 %-99 %] 97 % (11/12 0806)  Weight change:  Filed Weights   11/10/24 0630  Weight: 92.1 kg    Intake/Output: I/O last 3 completed shifts: In: 340 [P.O.:240; IV Piggyback:100] Out: -    Intake/Output this shift:  Total I/O In: 360 [P.O.:360] Out: -   Physical Exam: General: NAD  Head: Normocephalic, atraumatic. Moist oral mucosal membranes  Eyes: Anicteric  Lungs:  Clear to auscultation, normal effort  Heart: Regular rate and rhythm  Abdomen:  Soft, nontender  Extremities: Trace peripheral edema.  Neurologic: Awake, alert, conversant  Skin: Warm,dry, no rash  Access: Right IJ PermCath    Basic Metabolic Panel: Recent Labs  Lab 11/10/24 0756 11/11/24 0524  NA 139 139  K 3.2* 3.6  CL 97* 98  CO2 26 25  GLUCOSE 136* 120*  BUN 33* 41*  CREATININE 4.08* 5.00*  CALCIUM 8.0* 8.3*    Liver Function Tests: Recent Labs  Lab 11/10/24 0756 11/11/24 0524  AST 16 12*  ALT 8 <5  ALKPHOS 110 127*  BILITOT 0.6 <0.2  PROT 7.3 6.3*  ALBUMIN  2.9* 3.1*   Recent Labs  Lab 11/10/24 0756   LIPASE 24   No results for input(s): AMMONIA in the last 168 hours.  CBC: Recent Labs  Lab 11/10/24 0756 11/11/24 0524  WBC 8.9 11.4*  NEUTROABS 7.3  --   HGB 16.3* 9.3*  HCT 52.5* 30.3*  MCV 91.8 94.4  PLT 144* 277    Cardiac Enzymes: No results for input(s): CKTOTAL, CKMB, CKMBINDEX, TROPONINI in the last 168 hours.  BNP: Invalid input(s): POCBNP  CBG: Recent Labs  Lab 11/10/24 1731 11/10/24 2105 11/11/24 0807 11/11/24 0828 11/11/24 1105  GLUCAP 104* 129* 90 120* 114*    Microbiology: Results for orders placed or performed during the hospital encounter of 11/10/24  Aerobic/Anaerobic Culture w Gram Stain (surgical/deep wound)     Status: None (Preliminary result)   Collection Time: 11/10/24 11:35 AM   Specimen: Abdomen; Abscess  Result Value Ref Range Status   Specimen Description   Final    ABDOMEN Performed at [facility], [address] [place], [place] [address]    Special Requests   Final    NONE Performed at [facility], [address] [place], [place] [address]    Gram Stain   Final    RARE WBC PRESENT, PREDOMINANTLY PMN RARE GRAM NEGATIVE RODS RARE GRAM POSITIVE COCCI IN PAIRS IN CHAINS    Culture   Final    TOO YOUNG TO READ Performed at [facility], [address], [place], [place] [address]    Report  Status PENDING  Incomplete  C Difficile Quick Screen w PCR reflex     Status: None   Collection Time: 11/10/24  2:30 PM   Specimen: STOOL  Result Value Ref Range Status   C Diff antigen NEGATIVE NEGATIVE Final   C Diff toxin NEGATIVE NEGATIVE Final   C Diff interpretation No C. difficile detected.  Final    Comment: Performed at [facility], [address] [place], [place] [address]    Coagulation Studies: Recent Labs    11/11/24 0524  LABPROT 15.6*  INR 1.2    Urinalysis: No results for input(s): COLORURINE, LABSPEC, PHURINE, GLUCOSEU, HGBUR, BILIRUBINUR,  KETONESUR, PROTEINUR, UROBILINOGEN, NITRITE, LEUKOCYTESUR in the last 72 hours.  Invalid input(s): APPERANCEUR    Imaging: CT ABDOMEN PELVIS W CONTRAST Result Date: 11/10/2024 CLINICAL DATA:  Hernia repair 1 month ago. Surgical site is now red, swollen, and malodorous. Abdominal pain. Bleeding from surgical site. EXAM: CT ABDOMEN AND PELVIS WITH CONTRAST TECHNIQUE: Multidetector CT imaging of the abdomen and pelvis was performed using the standard protocol following bolus administration of intravenous contrast. RADIATION DOSE REDUCTION: This exam was performed according to the departmental dose-optimization program which includes automated exposure control, adjustment of the mA and/or kV according to patient size and/or use of iterative reconstruction technique. CONTRAST:  OMNIPAQUE  IOHEXOL  300 MG/ML  SOLN COMPARISON:  09/15/2024 FINDINGS: Lower chest: Collapse/consolidation of the right base is similar to prior. Interval improvement in left lower lobe aeration. Hepatobiliary: No suspicious focal abnormality within the liver parenchyma. There is no evidence for gallstones, gallbladder wall thickening, or pericholecystic fluid. No intrahepatic or extrahepatic biliary dilation. Pancreas: No focal mass lesion. No dilatation of the main duct. No intraparenchymal cyst. No peripancreatic edema. Spleen: No splenomegaly. No suspicious focal mass lesion. Adrenals/Urinary Tract: Stable adrenal thickening without mass lesion. Kidneys unremarkable. No evidence for hydroureter. The urinary bladder appears normal for the degree of distention. Stomach/Bowel: Tiny hiatal hernia. Stomach is nondistended. Duodenum is normally positioned as is the ligament of Treitz. No small bowel wall thickening. No small bowel dilatation. The terminal ileum is normal. The appendix is normal. No gross colonic mass. No colonic wall thickening. Fluid density is seen throughout the colonic lumen from the ascending segment to  the distal rectum. Vascular/Lymphatic: There is moderate atherosclerotic calcification of the abdominal aorta without aneurysm. There is no gastrohepatic or hepatoduodenal ligament lymphadenopathy. No retroperitoneal or mesenteric lymphadenopathy. No pelvic sidewall lymphadenopathy. Reproductive: Large calcified uterine fibroid again noted. There is no adnexal mass. Other: No intraperitoneal free fluid. Musculoskeletal: Pelvic floor laxity evident. Skin thickening noted anterior abdominal wall with diffuse subcutaneous edema/inflammation left greater than right in the upper abdomen in fairly symmetric more inferiorly in the anterior abdominal wall and pannus. At least 2 distinct rim enhancing fluid collections are evident, 1 of which measures 3.1 x 2.3 cm on axial image 54/4 and coronal image 105/8. Umbilical anatomy is not well demonstrated hindering localization relative to the umbilicus. The second collection is more inferior in the pannus and measures 7.5 x 6.6 x 4.3 cm (axial 78/4 and sagittal 93/8). No worrisome lytic or sclerotic osseous abnormality. IMPRESSION: 1. Skin thickening with diffuse subcutaneous edema/inflammation in the anterior abdominal wall left greater than right in the upper abdomen an fairly symmetric more inferiorly in the anterior abdominal wall and pannus. At least 2 distinct rim enhancing fluid collections are evident, 1 of which measures 3.1 x 2.3 cm and the second more inferior in the pannus measures 7.5 x 6.6  x 4.3 cm. Umbilicus is not well demonstrated on the study hindering localization of these collections relative to the umbilical anatomy. Imaging features are compatible with abscesses. 2. Fluid density is seen throughout the colonic lumen from the ascending segment to the distal rectum. Imaging features can be seen in the setting of diarrhea. 3. Collapse/consolidation of the right base is similar to prior. Interval improvement in left lower lobe aeration. 4. Tiny hiatal hernia.  5. Large calcified uterine fibroid. 6. Pelvic floor laxity. 7.  Aortic Atherosclerosis (ICD10-I70.0). Electronically Signed   By: Camellia Candle M.D.   On: 11/10/2024 09:36     Medications:    piperacillin -tazobactam (ZOSYN )  IV 2.25 g (11/11/24 0931)   vancomycin       allopurinol   300 mg Oral BH-q7a   amLODipine   5 mg Oral Daily   aspirin  EC  81 mg Oral Daily   Chlorhexidine  Gluconate Cloth  6 each Topical Q0600   heparin   5,000 Units Subcutaneous Q8H   insulin  aspart  0-6 Units Subcutaneous TID WC   midodrine   10 mg Oral Q M,W,F-HD   acetaminophen  **OR** acetaminophen , albuterol , furosemide , HYDROmorphone  (DILAUDID ) injection, iohexol , lactulose , loperamide, oxyCODONE   Assessment/ Plan:  Brianna [name] is a 63 y.o.  female with past medical conditions including obstructive sleep apnea on CPAP, gout, GERD, type 2 diabetes, and end-stage renal disease on hemodialysis. Patient presents to ED with surgical site draining and has been admitted under observation for Abdominal wall abscess [L02.211] Abdominal wall cellulitis [L03.311] Drainage from wound [T14.8XXA]  [facility]/MWF/right IJ PermCath/left upper AVF.   Abdominal wall abscess, hernia repair site.  Original surgery took place on 09/13/2024.  Reported swelling and malodorous drainage.   Will continue vancomycin  and zosyn .   2.  End-stage renal disease on hemodialysis.  Scheduled for dialysis later today.  3. Anemia of chronic kidney disease Lab Results  Component Value Date   HGB 9.3 (L) 11/11/2024    Hemoglobin stable.  Will continue to monitor.  4. Secondary Hyperparathyroidism: with outpatient labs: PTH 1325, phosphorus 3.7, calcium 7.8 on 10/20/2023.   Lab Results  Component Value Date   PTH 651 (H) 05/24/2023   CALCIUM 8.3 (L) 11/11/2024   CAION 1.11 (L) 08/08/2023   PHOS 4.2 10/14/2024    Will continue to monitor bone minerals during this admission.  Patient prescribed sevelamer  outpatient  with meals.    LOS: 0 Edlin Ford 11/12/202511:06 AM

## 2024-11-11 NOTE — Hospital Course (Addendum)
 Brianna [name] is a pleasant 63 y.o. female with medical history significant for diabetes not on any medications except Ozempic, ESRD on hemodialysis, CHF, HTN, HLD, recent strangulated hernia s/p laparoscopic repair who came into ED complaining of spontaneous drainage from her laparotomy site in her abdominal wall started this morning around 4 AM.  She had exploratory laparotomy and reduction of incarcerated ventral hernia on 9/14 with Dr. Marinda.  Patient is seen by general surgery, started on antibiotics with vancomycin  and Zosyn  for wound infection. Patient condition has improved, general surgery has cleared patient for discharge, will treat with oral antibiotics including amoxicillin and Cipro for total course of 14 days.  Current culture growing gram-negative rods, gram-positive cocci in chains and pairs.  Discussed with general surgery, they can follow-up on the final results.

## 2024-11-11 NOTE — Care Management Obs Status (Signed)
 MEDICARE OBSERVATION STATUS NOTIFICATION   Patient Details  Name: Brianna [name] MRN: [id number] Date of Birth: [birth year]   Medicare Observation Status Notification Given:  Chaney BRANDY CHRISTIANE LELON, CMA 11/11/2024, 11:21 AM

## 2024-11-12 ENCOUNTER — Encounter: Admitting: General Surgery

## 2024-11-12 ENCOUNTER — Other Ambulatory Visit: Payer: Self-pay

## 2024-11-12 ENCOUNTER — Telehealth (HOSPITAL_COMMUNITY): Payer: Self-pay | Admitting: Pharmacy Technician

## 2024-11-12 ENCOUNTER — Other Ambulatory Visit (HOSPITAL_COMMUNITY): Payer: Self-pay

## 2024-11-12 DIAGNOSIS — T8140XA Infection following a procedure, unspecified, initial encounter: Secondary | ICD-10-CM | POA: Diagnosis not present

## 2024-11-12 DIAGNOSIS — I10 Essential (primary) hypertension: Secondary | ICD-10-CM

## 2024-11-12 DIAGNOSIS — L03311 Cellulitis of abdominal wall: Secondary | ICD-10-CM | POA: Diagnosis not present

## 2024-11-12 DIAGNOSIS — Z992 Dependence on renal dialysis: Secondary | ICD-10-CM | POA: Diagnosis not present

## 2024-11-12 DIAGNOSIS — L02211 Cutaneous abscess of abdominal wall: Secondary | ICD-10-CM | POA: Diagnosis not present

## 2024-11-12 DIAGNOSIS — N186 End stage renal disease: Secondary | ICD-10-CM | POA: Diagnosis not present

## 2024-11-12 LAB — GLUCOSE, CAPILLARY
Glucose-Capillary: 100 mg/dL — ABNORMAL HIGH (ref 70–99)
Glucose-Capillary: 111 mg/dL — ABNORMAL HIGH (ref 70–99)

## 2024-11-12 MED ORDER — CIPROFLOXACIN HCL 500 MG PO TABS
500.0000 mg | ORAL_TABLET | Freq: Every day | ORAL | 0 refills | Status: AC
  Filled 2024-11-12: qty 12, 12d supply, fill #0

## 2024-11-12 MED ORDER — FLUTICASONE FUROATE-VILANTEROL 200-25 MCG/ACT IN AEPB
1.0000 | INHALATION_SPRAY | Freq: Every day | RESPIRATORY_TRACT | 0 refills | Status: AC
  Filled 2024-11-12: qty 60, 30d supply, fill #0

## 2024-11-12 MED ORDER — CIPROFLOXACIN HCL 500 MG PO TABS
500.0000 mg | ORAL_TABLET | Freq: Every day | ORAL | Status: DC
Start: 2024-11-13 — End: 2024-11-12

## 2024-11-12 MED ORDER — DOXYCYCLINE HYCLATE 100 MG PO TABS
100.0000 mg | ORAL_TABLET | Freq: Two times a day (BID) | ORAL | 0 refills | Status: AC
  Filled 2024-11-12: qty 24, 12d supply, fill #0

## 2024-11-12 NOTE — Progress Notes (Signed)
 D/C order noted. Contacted [facility] to be advised of pt and d/c today. Requested documents faxed to clinic for continuation of care.  Suzen Satchel Dialysis Navigator [phone]

## 2024-11-12 NOTE — Discharge Summary (Signed)
 Physician Discharge Summary   Patient: Brianna [name] MRN: [id number] DOB: November 18, 1961  Admit date:     11/10/2024  Discharge date: 11/12/24  Discharge Physician: Murvin Mana   PCP: Cecily Katz, PA-C   Recommendations at discharge:   Follow-up with general surgery in 1 week. Follow-up with PCP in 1 week.  Discharge Diagnoses: Principal Problem:   Abdominal wall abscess Active Problems:   (HFpEF) heart failure with preserved ejection fraction (HCC)   Controlled type 2 diabetes mellitus without complication, without long-term current use of insulin  (HCC)   Hypertension   ESRD on hemodialysis (HCC)  Resolved Problems:   * No resolved hospital problems. *  Hospital Course:  Brianna [name] is a pleasant 63 y.o. female with medical history significant for diabetes not on any medications except Ozempic, ESRD on hemodialysis, CHF, HTN, HLD, recent strangulated hernia s/p laparoscopic repair who came into ED complaining of spontaneous drainage from her laparotomy site in her abdominal wall started this morning around 4 AM.  She had exploratory laparotomy and reduction of incarcerated ventral hernia on 9/14 with Dr. Marinda.  Patient is seen by general surgery, started on antibiotics with vancomycin  and Zosyn  for wound infection. Patient condition has improved, general surgery has cleared patient for discharge, will treat with oral antibiotics including amoxicillin and Cipro for total course of 14 days.  Current culture growing gram-negative rods, gram-positive cocci in chains and pairs.  Discussed with general surgery, they can follow-up on the final results.   Assessment and Plan:  Abdominal wall abscess secondary to surgical wound infection. General surgery is following.  Treated with Zosyn  and vancomycin .  Wound culture grow gram-positive as well as gram-negative bacteria.  Condition appear to be improving, general surgery has cleared patient for discharge.  Will continue  doxycycline as well as Cipro for total antibiotic course of 14 days.  Discussed with general surgery, they will follow-up on the final wound culture results. Patient still has significant induration in the wound area, this may eventually develop abscess, patient will follow-up with general surgery for I&D if needed.    Type 2 diabetes .  Resume home treatment.    ESRD on hemodialysis Hypokalemia. Continue dialysis as outpatient.   Essential hypertension. - Resume home medications   Chronic diastolic congestive heart failure. No exacerbation.   Class II obesity with BMI 39.79, Morbid obesity due to comorbidities. Diet and exercise.   Likely COPD. Patient has labored breathing, short of breath with exertion, most likely COPD.  Add Breo.  Short of breath seem to be improving with it.         Consultants: Nephrology, General surgery Procedures performed: None  Disposition: Home Diet recommendation:  Discharge Diet Orders (From admission, onward)     Start     Ordered   11/12/24 0000  Diet renal with fluid restriction        11/12/24 1245           Renal diet DISCHARGE MEDICATION: Allergies as of 11/12/2024       Reactions   Ace Inhibitors Anaphylaxis   Lisinopril Anaphylaxis   Atorvastatin Other (See Comments)   Muscle aches        Medication List     STOP taking these medications    carvedilol  12.5 MG tablet Commonly known as: COREG    cyanocobalamin  1000 MCG tablet Commonly known as: VITAMIN B12   FeroSul 325 (65 Fe) MG tablet Generic drug: ferrous sulfate   lactulose  10 GM/15ML solution Commonly known  as: CHRONULAC    Oxycodone  HCl 10 MG Tabs   triamcinolone ointment 0.5 % Commonly known as: KENALOG   Zetia  10 MG tablet Generic drug: ezetimibe    zolpidem  5 MG tablet Commonly known as: AMBIEN        TAKE these medications    albuterol  108 (90 Base) MCG/ACT inhaler Commonly known as: VENTOLIN  HFA Inhale 1-2 puffs into the lungs  every 6 (six) hours as needed for wheezing or shortness of breath.   albuterol  (2.5 MG/3ML) 0.083% nebulizer solution Commonly known as: PROVENTIL  Take 2.5 mg by nebulization every 4 (four) hours as needed for shortness of breath or wheezing.   allopurinol  300 MG tablet Commonly known as: ZYLOPRIM  Take 300 mg by mouth every morning.   amLODipine  5 MG tablet Commonly known as: NORVASC  Take 5 mg by mouth daily.   Aspirin  Adult Low Strength 81 MG tablet Generic drug: aspirin  EC Take 81 mg by mouth daily.   cetirizine 10 MG tablet Commonly known as: ZYRTEC Take 10 mg by mouth every morning.   ciprofloxacin 500 MG tablet Commonly known as: Cipro Take 1 tablet (500 mg total) by mouth daily with breakfast for 12 days.   doxycycline 100 MG tablet Commonly known as: VIBRA-TABS Take 1 tablet (100 mg total) by mouth 2 (two) times daily for 12 days.   fluticasone  furoate-vilanterol 200-25 MCG/ACT Aepb Commonly known as: BREO ELLIPTA Inhale 1 puff into the lungs daily. Start taking on: November 13, 2024   furosemide  80 MG tablet Commonly known as: LASIX  Take 1 tablet (80 mg total) by mouth daily. For leg swelling or weight gain >5lbs   midodrine  10 MG tablet Commonly known as: PROAMATINE  Take 1 tablet (10 mg total) by mouth every dialysis (hypotension).   omeprazole 20 MG capsule Commonly known as: PRILOSEC Take 20 mg by mouth every morning.   Ozempic (1 MG/DOSE) 4 MG/3ML Sopn Generic drug: Semaglutide (1 MG/DOSE) Inject 1 mg into the skin once a week.   rosuvastatin 40 MG tablet Commonly known as: CRESTOR Take 40 mg by mouth daily.   sertraline  25 MG tablet Commonly known as: ZOLOFT  Take 25 mg by mouth daily.   sevelamer  carbonate 800 MG tablet Commonly known as: RENVELA  Take 1,600 mg by mouth 3 (three) times daily.   traZODone 50 MG tablet Commonly known as: DESYREL Take 50 mg by mouth at bedtime.   Vitamin D  (Ergocalciferol ) 1.25 MG (50000 UNIT) Caps  capsule Commonly known as: DRISDOL Take 50,000 Units by mouth once a week.               Discharge Care Instructions  (From admission, onward)           Start     Ordered   11/12/24 0000  Discharge wound care:       Comments: Wound care  Every shift      Comments: Using a Q tip applicator insert saline (or Vase) moistened Kerlix roll gauze into wound bed daily leaving a significant portion hanging from wound for easy removal.  Cover with dry gauze/ABD pad and clothe tape. Change outer dressing as needed for drainage saturation. Change BID Follow with general surgery   11/12/24 1245            Follow-up Information     Marinda Jayson KIDD, MD. Go on 11/19/2024.   Specialty: General Surgery Why: Go to appointment on 11/20 at 11 AM Contact information: [address] [place] [address] [phone]  Cecily Katz, PA-C Follow up in 1 week(s).   Specialty: Physician Assistant Contact information: [address] [place] [address] [phone]                Discharge Exam: Brianna [name]   11/10/24 0630 11/11/24 1330 11/11/24 1650  Weight: 92.1 kg 83.4 kg 82.7 kg   General exam: Appears calm and comfortable  Respiratory system: Clear to auscultation. Respiratory effort normal. Cardiovascular system: S1 & S2 heard, RRR. No JVD, murmurs, rubs, gallops or clicks. No pedal edema. Gastrointestinal system: Abdomen is nondistended, soft and nontender. No organomegaly or masses felt. Normal bowel sounds heard. Central nervous system: Alert and oriented. No focal neurological deficits. Extremities: Symmetric 5 x 5 power. Skin: No rashes, lesions or ulcers Psychiatry: Judgement and insight appear normal. Mood & affect appropriate.  Abdominal wound still has significant induration.  Condition at discharge: good  The results of significant diagnostics from this hospitalization (including imaging, microbiology, ancillary and  laboratory) are listed below for reference.   Imaging Studies: CT ABDOMEN PELVIS W CONTRAST Result Date: 11/10/2024 CLINICAL DATA:  Hernia repair 1 month ago. Surgical site is now red, swollen, and malodorous. Abdominal pain. Bleeding from surgical site. EXAM: CT ABDOMEN AND PELVIS WITH CONTRAST TECHNIQUE: Multidetector CT imaging of the abdomen and pelvis was performed using the standard protocol following bolus administration of intravenous contrast. RADIATION DOSE REDUCTION: This exam was performed according to the departmental dose-optimization program which includes automated exposure control, adjustment of the mA and/or kV according to patient size and/or use of iterative reconstruction technique. CONTRAST:  OMNIPAQUE  IOHEXOL  300 MG/ML  SOLN COMPARISON:  09/15/2024 FINDINGS: Lower chest: Collapse/consolidation of the right base is similar to prior. Interval improvement in left lower lobe aeration. Hepatobiliary: No suspicious focal abnormality within the liver parenchyma. There is no evidence for gallstones, gallbladder wall thickening, or pericholecystic fluid. No intrahepatic or extrahepatic biliary dilation. Pancreas: No focal mass lesion. No dilatation of the main duct. No intraparenchymal cyst. No peripancreatic edema. Spleen: No splenomegaly. No suspicious focal mass lesion. Adrenals/Urinary Tract: Stable adrenal thickening without mass lesion. Kidneys unremarkable. No evidence for hydroureter. The urinary bladder appears normal for the degree of distention. Stomach/Bowel: Tiny hiatal hernia. Stomach is nondistended. Duodenum is normally positioned as is the ligament of Treitz. No small bowel wall thickening. No small bowel dilatation. The terminal ileum is normal. The appendix is normal. No gross colonic mass. No colonic wall thickening. Fluid density is seen throughout the colonic lumen from the ascending segment to the distal rectum. Vascular/Lymphatic: There is moderate atherosclerotic  calcification of the abdominal aorta without aneurysm. There is no gastrohepatic or hepatoduodenal ligament lymphadenopathy. No retroperitoneal or mesenteric lymphadenopathy. No pelvic sidewall lymphadenopathy. Reproductive: Large calcified uterine fibroid again noted. There is no adnexal mass. Other: No intraperitoneal free fluid. Musculoskeletal: Pelvic floor laxity evident. Skin thickening noted anterior abdominal wall with diffuse subcutaneous edema/inflammation left greater than right in the upper abdomen in fairly symmetric more inferiorly in the anterior abdominal wall and pannus. At least 2 distinct rim enhancing fluid collections are evident, 1 of which measures 3.1 x 2.3 cm on axial image 54/4 and coronal image 105/8. Umbilical anatomy is not well demonstrated hindering localization relative to the umbilicus. The second collection is more inferior in the pannus and measures 7.5 x 6.6 x 4.3 cm (axial 78/4 and sagittal 93/8). No worrisome lytic or sclerotic osseous abnormality. IMPRESSION: 1. Skin thickening with diffuse subcutaneous edema/inflammation in the anterior abdominal wall left  greater than right in the upper abdomen an fairly symmetric more inferiorly in the anterior abdominal wall and pannus. At least 2 distinct rim enhancing fluid collections are evident, 1 of which measures 3.1 x 2.3 cm and the second more inferior in the pannus measures 7.5 x 6.6 x 4.3 cm. Umbilicus is not well demonstrated on the study hindering localization of these collections relative to the umbilical anatomy. Imaging features are compatible with abscesses. 2. Fluid density is seen throughout the colonic lumen from the ascending segment to the distal rectum. Imaging features can be seen in the setting of diarrhea. 3. Collapse/consolidation of the right base is similar to prior. Interval improvement in left lower lobe aeration. 4. Tiny hiatal hernia. 5. Large calcified uterine fibroid. 6. Pelvic floor laxity. 7.  Aortic  Atherosclerosis (ICD10-I70.0). Electronically Signed   By: Camellia Candle M.D.   On: 11/10/2024 09:36    Microbiology: Results for orders placed or performed during the hospital encounter of 11/10/24  Aerobic/Anaerobic Culture w Gram Stain (surgical/deep wound)     Status: None (Preliminary result)   Collection Time: 11/10/24 11:35 AM   Specimen: Abdomen; Abscess  Result Value Ref Range Status   Specimen Description   Final    ABDOMEN Performed at [facility] [address], [place], [place] [address]    Special Requests   Final    NONE Performed at [facility], [address] [place], [place] [address]    Gram Stain   Final    RARE WBC PRESENT, PREDOMINANTLY PMN RARE GRAM NEGATIVE RODS RARE GRAM POSITIVE COCCI IN PAIRS IN CHAINS    Culture   Final    CULTURE REINCUBATED FOR BETTER GROWTH Performed at [facility], [address], [place], [place] [address]    Report Status PENDING  Incomplete  C Difficile Quick Screen w PCR reflex     Status: None   Collection Time: 11/10/24  2:30 PM   Specimen: STOOL  Result Value Ref Range Status   C Diff antigen NEGATIVE NEGATIVE Final   C Diff toxin NEGATIVE NEGATIVE Final   C Diff interpretation No C. difficile detected.  Final    Comment: Performed at [facility], [address] [place], [place] [address]    Labs: CBC: Recent Labs  Lab 11/10/24 0756 11/11/24 0524  WBC 8.9 11.4*  NEUTROABS 7.3  --   HGB 16.3* 9.3*  HCT 52.5* 30.3*  MCV 91.8 94.4  PLT 144* 277   Basic Metabolic Panel: Recent Labs  Lab 11/10/24 0756 11/11/24 0524  NA 139 139  K 3.2* 3.6  CL 97* 98  CO2 26 25  GLUCOSE 136* 120*  BUN 33* 41*  CREATININE 4.08* 5.00*  CALCIUM 8.0* 8.3*   Liver Function Tests: Recent Labs  Lab 11/10/24 0756 11/11/24 0524  AST 16 12*  ALT 8 <5  ALKPHOS 110 127*  BILITOT 0.6 <0.2  PROT 7.3 6.3*  ALBUMIN  2.9* 3.1*   CBG: Recent Labs  Lab 11/11/24 1105 11/11/24 1727  11/11/24 2049 11/12/24 0813 11/12/24 1152  GLUCAP 114* 134* 106* 100* 111*    Discharge time spent: 35 minutes.  Signed: Murvin Mana, MD [facility] 11/12/2024

## 2024-11-12 NOTE — Plan of Care (Signed)
  Problem: Fluid Volume: Goal: Ability to maintain a balanced intake and output will improve Outcome: Progressing   Problem: Skin Integrity: Goal: Risk for impaired skin integrity will decrease Outcome: Progressing   Problem: Clinical Measurements: Goal: Ability to maintain clinical measurements within normal limits will improve Outcome: Progressing   Problem: Pain Managment: Goal: General experience of comfort will improve and/or be controlled Outcome: Progressing   Problem: Safety: Goal: Ability to remain free from injury will improve Outcome: Progressing

## 2024-11-12 NOTE — Telephone Encounter (Signed)
 Patient Product/process Development Scientist completed.    The patient is insured through [employer] and [employer].     Ran test claim for [facility] and the current 30 day co-pay is $0.00.   This test claim was processed through [facility]- copay amounts may vary at other pharmacies due to pharmacy/plan contracts, or as the patient moves through the different stages of their insurance plan.     Reyes Sharps, CPHT Pharmacy Technician Patient Advocate Specialist [facility] Patient Advocate Team Direct Number: [phone]  Fax: [phone]

## 2024-11-12 NOTE — Progress Notes (Signed)
 [facility] SURGICAL PROGRESS NOTE (cpt [phone])  Hospital Day(s): 1.   Interval History: Patient seen and examined, no acute events or new complaints overnight. Patient reports she is doing well. Abdominal pain improving. Abdominal wall cellulitis certainly improved. No fever, chills. No new labs this morning. Cx from wound with GPC in pairs in chains and GNR. She is on Zosyn .   Review of Systems:  Constitutional: denies fever, chills  HEENT: denies cough or congestion  Respiratory: denies any shortness of breath  Cardiovascular: denies chest pain or palpitations  Gastrointestinal: denies abdominal pain, N/V Genitourinary: denies burning with urination or urinary frequency Musculoskeletal: denies pain, decreased motor or sensation Integumentary: + Cellulitis (improving)  Vital signs in last 24 hours: [min-max] current  Temp:  [97.3 F (36.3 C)-98.8 F (37.1 C)] 97.3 F (36.3 C) (11/12 1935) Pulse Rate:  [81-87] 82 (11/12 1935) Resp:  [17-30] 18 (11/12 1935) BP: (123-148)/(65-77) 129/65 (11/12 1935) SpO2:  [96 %-99 %] 96 % (11/12 1935) Weight:  [82.7 kg-83.4 kg] 82.7 kg (11/12 1650)     Height: 4' 9 (144.8 cm) Weight: 82.7 kg BMI (Calculated): 39.44   Intake/Output last 2 shifts:  11/12 0701 - 11/13 0700 In: 692.2 [P.O.:360; IV Piggyback:332.2] Out: 700    Physical Exam:  Constitutional: alert, cooperative and no distress  HENT: normocephalic without obvious abnormality  Eyes: PERRL, EOM's grossly intact and symmetric  Respiratory: breathing non-labored at rest  Cardiovascular: regular rate and sinus rhythm  Gastrointestinal: soft, non-tender, and non-distended. Erythema to the superior portion of her laparotomy and to the right lateral aspect of this has nearly resolved; induration remains expectedly. There is a ~1 cm opening to the superior corner of her laparotomy. No significant drainage expressible but certainly still seropurulent on dressing this AM.   Probing with q-tip revealed tracking to the right portion of this wound. There was no evidence of bilious output.      Labs:     Latest Ref Rng & Units 11/11/2024    5:24 AM 11/10/2024    7:56 AM 10/15/2024    3:23 AM  CBC  WBC 4.0 - 10.5 K/uL 11.4  8.9  6.3   Hemoglobin 12.0 - 15.0 g/dL 9.3  83.6  8.5   Hematocrit 36.0 - 46.0 % 30.3  52.5  26.9   Platelets 150 - 400 K/uL 277  144  213       Latest Ref Rng & Units 11/11/2024    5:24 AM 11/10/2024    7:56 AM 10/15/2024    3:23 AM  CMP  Glucose 70 - 99 mg/dL 879  863  83   BUN 8 - 23 mg/dL 41  33  25   Creatinine 0.44 - 1.00 mg/dL 4.99  5.91  6.41   Sodium 135 - 145 mmol/L 139  139  131   Potassium 3.5 - 5.1 mmol/L 3.6  3.2  3.9   Chloride 98 - 111 mmol/L 98  97  95   CO2 22 - 32 mmol/L 25  26  26    Calcium 8.9 - 10.3 mg/dL 8.3  8.0  8.8   Total Protein 6.5 - 8.1 g/dL 6.3  7.3    Total Bilirubin 0.0 - 1.2 mg/dL <9.7  0.6    Alkaline Phos 38 - 126 U/L 127  110    AST 15 - 41 U/L 12  16    ALT 0 - 44 U/L <5  8       Imaging studies:  No new pertinent imaging studies   Assessment/Plan:  63 y.o. female with abdominal wall fluid collection concerning for seroma vs infect seroma vs abscess s/p exploratory laparotomy and reduction of ventral hernia on 09/14, complicated by pertinent comorbidities including ESRD.    - Wound Care: Pack wound with saline (or Vase) moistened rolled gauze, cover, and secure. I do think this should be done BID while in house. Can transition to once daily at home.  - Continue IV Abx (Zosyn ): Follow up Cx - She will need PO Abx for home to complete 14 days total (IV+PO)  - Monitor abdominal examination   - Okay to continue diet as tolerated   - I will place order for home health for dressing changes as I am not sure she will manage on her own.   - Appreciate nephrology assistance for HD - Further management per primary service; we will follow     - Discharge Planning: From a surgical perspective,  she could potentially go home later today. Antibiotics and wound care recommendations as above. She may benefit from home health for wound care. We will plan to follow up next week as well.   All of the above findings and recommendations were discussed with the patient, and the medical team, and all of patient's questions were answered to her expressed satisfaction.  -- [name], PA-C [facility] 11/12/2024, 7:16 AM M-F: 7am - 4pm

## 2024-11-12 NOTE — Progress Notes (Signed)
 Patient was given verbal and written discharge instructions. She acknowledges understanding and states that she will comply. Wound was packed and a dry dressing was applied. Notes a small amount of redness of abdominal folds on the right side. Abdomen was soft and non tender to touch. Patient has been having soft bowel movements and she states that she will continue to drink plenty of fluids and monitor. Patient is in the room waiting on her ride to be discharged to home.

## 2024-11-12 NOTE — Progress Notes (Signed)
 Pts abdominal wound dressing changed.

## 2024-11-12 NOTE — Progress Notes (Signed)
 [facility]  ROUNDING NOTE   Subjective:   Brianna [name] is a 63 year old female with past medical conditions including obstructive sleep apnea on CPAP, gout, GERD, type 2 diabetes, and end-stage renal disease on hemodialysis. Patient presents to ED with surgical sire bleeding and has been admitted under observation for Abdominal wall abscess [L02.211] Abdominal wall cellulitis [L03.311] Drainage from wound [T14.8XXA]  Patient is known to our practice and receives outpatient dialysis treatments on MWF at [facility], supervised by Dr marcelino.  Patient seen sitting at side of bed Denies pain or discomfort Hopeful to discharge today   Objective:  Vital signs in last 24 hours:  Temp:  [97.3 F (36.3 C)-98.5 F (36.9 C)] 98.5 F (36.9 C) (11/13 0817) Pulse Rate:  [82-86] 84 (11/13 0817) Resp:  [16-30] 16 (11/13 0817) BP: (123-186)/(65-99) 186/99 (11/13 0817) SpO2:  [96 %-99 %] 98 % (11/13 0817) Weight:  [82.7 kg] 82.7 kg (11/12 1650)  Weight change:  Filed Weights   11/10/24 0630 11/11/24 1330 11/11/24 1650  Weight: 92.1 kg 83.4 kg 82.7 kg    Intake/Output: I/O last 3 completed shifts: In: 1032.2 [P.O.:600; IV Piggyback:432.2] Out: 700 [Other:700]   Intake/Output this shift:  Total I/O In: 120 [P.O.:120] Out: -   Physical Exam: General: NAD  Head: Normocephalic, atraumatic. Moist oral mucosal membranes  Eyes: Anicteric  Lungs:  Clear to auscultation, normal effort  Heart: Regular rate and rhythm  Abdomen:  Soft, nontender  Extremities: Trace peripheral edema.  Neurologic: Awake, alert, conversant  Skin: Warm,dry, no rash  Access: Right IJ PermCath    Basic Metabolic Panel: Recent Labs  Lab 11/10/24 0756 11/11/24 0524  NA 139 139  K 3.2* 3.6  CL 97* 98  CO2 26 25  GLUCOSE 136* 120*  BUN 33* 41*  CREATININE 4.08* 5.00*  CALCIUM 8.0* 8.3*    Liver Function Tests: Recent Labs  Lab 11/10/24 0756 11/11/24 0524  AST 16 12*   ALT 8 <5  ALKPHOS 110 127*  BILITOT 0.6 <0.2  PROT 7.3 6.3*  ALBUMIN  2.9* 3.1*   Recent Labs  Lab 11/10/24 0756  LIPASE 24   No results for input(s): AMMONIA in the last 168 hours.  CBC: Recent Labs  Lab 11/10/24 0756 11/11/24 0524  WBC 8.9 11.4*  NEUTROABS 7.3  --   HGB 16.3* 9.3*  HCT 52.5* 30.3*  MCV 91.8 94.4  PLT 144* 277    Cardiac Enzymes: No results for input(s): CKTOTAL, CKMB, CKMBINDEX, TROPONINI in the last 168 hours.  BNP: Invalid input(s): POCBNP  CBG: Recent Labs  Lab 11/11/24 1105 11/11/24 1727 11/11/24 2049 11/12/24 0813 11/12/24 1152  GLUCAP 114* 134* 106* 100* 111*    Microbiology: Results for orders placed or performed during the hospital encounter of 11/10/24  Aerobic/Anaerobic Culture w Gram Stain (surgical/deep wound)     Status: None (Preliminary result)   Collection Time: 11/10/24 11:35 AM   Specimen: Abdomen; Abscess  Result Value Ref Range Status   Specimen Description   Final    ABDOMEN Performed at [facility], [address] [place], [place] [address]    Special Requests   Final    NONE Performed at [facility], [address] [place], [place] [address]    Gram Stain   Final    RARE WBC PRESENT, PREDOMINANTLY PMN RARE GRAM NEGATIVE RODS RARE GRAM POSITIVE COCCI IN PAIRS IN CHAINS    Culture   Final    ABUNDANT STREPTOCOCCUS CONSTELLATUS  SUSCEPTIBILITIES TO FOLLOW Performed at [facility], [address], [place], [place] [address]    Report Status PENDING  Incomplete  C Difficile Quick Screen w PCR reflex     Status: None   Collection Time: 11/10/24  2:30 PM   Specimen: STOOL  Result Value Ref Range Status   C Diff antigen NEGATIVE NEGATIVE Final   C Diff toxin NEGATIVE NEGATIVE Final   C Diff interpretation No C. difficile detected.  Final    Comment: Performed at [facility], [address] [place], [place] [address]    Coagulation Studies: Recent Labs     11/11/24 0524  LABPROT 15.6*  INR 1.2    Urinalysis: No results for input(s): COLORURINE, LABSPEC, PHURINE, GLUCOSEU, HGBUR, BILIRUBINUR, KETONESUR, PROTEINUR, UROBILINOGEN, NITRITE, LEUKOCYTESUR in the last 72 hours.  Invalid input(s): APPERANCEUR    Imaging: No results found.    Medications:    piperacillin -tazobactam (ZOSYN )  IV 2.25 g (11/12/24 0909)   vancomycin  Stopped (11/11/24 1919)    allopurinol   100 mg Oral Q M,W,F-HD   amLODipine   5 mg Oral Daily   aspirin  EC  81 mg Oral Daily   Chlorhexidine  Gluconate Cloth  6 each Topical Q0600   fluticasone  furoate-vilanterol  1 puff Inhalation Daily   heparin   5,000 Units Subcutaneous Q8H   insulin  aspart  0-6 Units Subcutaneous TID WC   midodrine   10 mg Oral Q M,W,F-HD   acetaminophen  **OR** acetaminophen , albuterol , furosemide , HYDROmorphone  (DILAUDID ) injection, iohexol , lactulose , loperamide, oxyCODONE   Assessment/ Plan:  [name] is a 63 y.o.  female with past medical conditions including obstructive sleep apnea on CPAP, gout, GERD, type 2 diabetes, and end-stage renal disease on hemodialysis. Patient presents to ED with surgical site draining and has been admitted under observation for Abdominal wall abscess [L02.211] Abdominal wall cellulitis [L03.311] Drainage from wound [T14.8XXA]  [facility]/MWF/right IJ PermCath/left upper AVF.   Abdominal wall abscess, hernia repair site.  Original surgery took place on 09/13/2024.  Reported swelling and malodorous drainage.   Received vancomycin  and zpsyn during this admission. Will discharge with cipro and doxycycline.   2.  End-stage renal disease on hemodialysis.  Dialysis received yesterday, UF removed. Next treatment scheduled for Friday.   3. Anemia of chronic kidney disease Lab Results  Component Value Date   HGB 9.3 (L) 11/11/2024    Received Mircera outpatient. Hgb within optimal range for renal patient.    4. Secondary Hyperparathyroidism: with outpatient labs: PTH 1325, phosphorus 3.7, calcium 7.8 on 10/20/2023.   Lab Results  Component Value Date   PTH 651 (H) 05/24/2023   CALCIUM 8.3 (L) 11/11/2024   CAION 1.11 (L) 08/08/2023   PHOS 4.2 10/14/2024    Will continue to monitor bone minerals during this admission.  Patient prescribed sevelamer  outpatient with meals.    LOS: 1 Paulino Cork 11/13/20252:16 PM

## 2024-11-13 ENCOUNTER — Telehealth: Payer: Self-pay | Admitting: General Surgery

## 2024-11-13 LAB — AEROBIC/ANAEROBIC CULTURE W GRAM STAIN (SURGICAL/DEEP WOUND)

## 2024-11-13 NOTE — Telephone Encounter (Signed)
 Pt said that the dr told her that he wanted her to clean the bandages every day since it has to packed but the [facility] nurse can not come today to see her. Please advise. Pt # is [phone]

## 2024-11-13 NOTE — Telephone Encounter (Signed)
 Called and spoke with the patient. She said that [facility] did not receive her orders for wound and home care until this morning. They did not say when they would be coming out yet. The patient is currently at Dialysis and depends on transportation.  I spoke with Dr Marinda about this. The patient is going to see if transportation can bring her to the office today to have a nurse visit to redress the area. If not Dr Marinda can see the patient on Monday for a post op visit.  She will call us  and let us  know if she can come today.

## 2024-11-16 ENCOUNTER — Ambulatory Visit

## 2024-11-19 ENCOUNTER — Encounter: Payer: Self-pay | Admitting: General Surgery

## 2024-11-19 ENCOUNTER — Ambulatory Visit (INDEPENDENT_AMBULATORY_CARE_PROVIDER_SITE_OTHER): Admitting: General Surgery

## 2024-11-19 VITALS — BP 157/89 | HR 91 | Temp 98.6°F | Ht 59.0 in | Wt 184.4 lb

## 2024-11-19 DIAGNOSIS — K56609 Unspecified intestinal obstruction, unspecified as to partial versus complete obstruction: Secondary | ICD-10-CM

## 2024-11-19 DIAGNOSIS — K430 Incisional hernia with obstruction, without gangrene: Secondary | ICD-10-CM | POA: Diagnosis not present

## 2024-11-19 DIAGNOSIS — L03311 Cellulitis of abdominal wall: Secondary | ICD-10-CM

## 2024-11-19 DIAGNOSIS — Z09 Encounter for follow-up examination after completed treatment for conditions other than malignant neoplasm: Secondary | ICD-10-CM

## 2024-11-19 DIAGNOSIS — T8140XD Infection following a procedure, unspecified, subsequent encounter: Secondary | ICD-10-CM | POA: Diagnosis not present

## 2024-11-19 NOTE — Addendum Note (Signed)
 Addended by: MARINDA SAVANT on: 11/19/2024 01:34 PM   Modules accepted: Level of Service

## 2024-11-19 NOTE — Patient Instructions (Signed)
 Cellulitis, Adult    Cellulitis is a skin infection. The infected area is often warm, red, swollen, and sore. It occurs most often on the legs, feet, and toes, but can happen on any part of the body.  This condition can be life-threatening without treatment. It is very important to get treated right away.  What are the causes?  This condition is caused by bacteria. The bacteria enter through a break in the skin, such as:  A cut.  A burn.  A bug bite.  An animal bite.  An open sore.  A crack.  What increases the risk?  Having a weak body's defense system (immune system).  Being older than 63 years old.  Having a blood sugar problem (diabetes).  Having a long-term liver disease (cirrhosis) or kidney disease.  Being very overweight (obese).  Having a skin problem, such as:  An itchy rash.  A rash caused by a fungus.  A rash with blisters.  Slow movement of blood in the veins (venous stasis).  Fluid buildup below the skin (edema).  This condition is more likely to occur in people who:  Have open cuts, burns, bites, or scrapes on the skin.  Have been treated with high-energy rays (radiation).  Use IV drugs.  What are the signs or symptoms?  Skin that:  Looks red or purple, or slightly darker than your usual skin color.  Has streaks.  Has spots.  Is swollen.  Is sore or painful when you touch it.  Is warm.  A fever.  Chills.  Blisters.  Tiredness (fatigue).  How is this treated?  Medicines to treat infections or allergies.  Rest.  Placing cold or warm cloths on the skin.  Staying in the hospital, if the condition is very bad. You may need medicines through an IV.  Follow these instructions at home:  Medicines  Take over-the-counter and prescription medicines only as told by your doctor.  If you were prescribed antibiotics, take them as told by your doctor. Do not stop using them even if you start to feel better.  General instructions  Drink enough fluid to keep your pee (urine) pale yellow.  Do not touch or rub the  infected area.  Raise (elevate) the infected area above the level of your heart while you are sitting or lying down.  Return to your normal activities when your doctor says that it is safe.  Place cold or warm cloths on the area as told by your doctor.  Keep all follow-up visits. Your doctor will need to make sure that a more serious infection is not developing.  Contact a doctor if:  You have a fever.  You do not start to get better after 1-2 days of treatment.  Your bone or joint under the infected area starts to hurt after the skin has healed.  Your infection comes back in the same area or another area. Signs of this may include:  You have a swollen bump in the area.  Your red area gets larger, turns dark in color, or hurts more.  You have more fluid coming from the wound.  Pus or a bad smell develops in your infected area.  You have more pain.  You feel sick and have muscle aches and weakness.  You develop vomiting or watery poop that will not go away.  Get help right away if:  You see red streaks coming from the area.  You notice the skin turns purple or black and falls  off.  These symptoms may be an emergency. Get help right away. Call 911.  Do not wait to see if the symptoms will go away.  Do not drive yourself to the hospital.  This information is not intended to replace advice given to you by your health care provider. Make sure you discuss any questions you have with your health care provider.  Document Revised: 08/14/2022 Document Reviewed: 08/14/2022  Elsevier Patient Education  2024 [employer].

## 2024-11-19 NOTE — Progress Notes (Signed)
 Outpatient Surgical Follow Up  11/19/2024  Brianna [name] is an 63 y.o. female.   Chief Complaint  Patient presents with   Follow-up    Abdominal wall cellulitis     HPI: Patient had a repair of ventral hernia that was obstructed in September.  A Vicryl mesh was used.  She did have a small separation of her wound.  She returned to the hospital about 2 weeks ago with abdominal wall cellulitis.  An incision and drainage of her abdomen was then performed.  She has been packing it since then.  She reports doing well.  She says that she has almost completed her antibiotics.  She does have some diarrhea with the antibiotics.  She says that the redness around her abdomen has resolved.  She reports a little bit of serosanguineous drainage when she pulls the bandage out.  Past Medical History:  Diagnosis Date   Acid reflux    Anasarca    Anemia    Asthma    well controlled   Bilateral leg edema    Bronchopneumonia    Chronic heart failure with preserved ejection fraction (HFpEF) (HCC)    a. 12/2020 Echo: EF 70-75%, no rwma, mild LVH, GrI DD, nl RV fxn, triv MR/AI.   CKD (chronic kidney disease), stage IV (HCC)    DM (diabetes mellitus), type 2 (HCC)    Dyspnea    Hypertension    Hypokalemia    Morbid obesity (HCC)    Strangulated ventral hernia     Past Surgical History:  Procedure Laterality Date   A/V FISTULAGRAM Left 01/28/2024   Procedure: A/V Fistulagram;  Surgeon: Jama Cordella MATSU, MD;  Location: [facility] INVASIVE CV LAB;  Service: Cardiovascular;  Laterality: Left;   A/V FISTULAGRAM Left 04/14/2024   Procedure: A/V Fistulagram;  Surgeon: Jama Cordella MATSU, MD;  Location: [facility] INV[facility];  Service: Cardiovascular;  Laterality: Left;   AV FISTULA PLACEMENT Left 08/08/2023   Procedure: ARTERIOVENOUS (AV) FISTULA CREATION (RADIALCEPHALIC);  Surgeon: Marea Selinda RAMAN, MD;  Location: [facility] ORS;  Service: Vascular;  Laterality: Left;  regional with MAC   BOWEL RESECTION  02/09/2020    Procedure: SMALL BOWEL RESECTION;  Surgeon: Desiderio Schanz, MD;  Location: [facility] ORS;  Service: General;;   DIALYSIS/PERMA CATHETER INSERTION N/A 05/28/2023   Procedure: DIALYSIS/PERMA CATHETER INSERTION;  Surgeon: Jama Cordella MATSU, MD;  Location: [facility];  Service: Cardiovascular;  Laterality: N/A;   LAPAROSCOPIC TRANSABDOMINAL HERNIA N/A 09/12/2024   Procedure: REPAIR, HERNIA, ABDOMINAL APPROACH;  Surgeon: Marinda Jayson KIDD, MD;  Location: [facility] ORS;  Service: General;  Laterality: N/A;   LAPAROTOMY N/A 02/09/2020   Procedure: EXPLORATORY LAPAROTOMY;  Surgeon: Desiderio Schanz, MD;  Location: [facility] ORS;  Service: General;  Laterality: N/A;   LYSIS OF ADHESION  09/12/2024   Procedure: LAPAROTOMY, FOR LYSIS OF ADHESIONS;  Surgeon: Marinda Jayson KIDD, MD;  Location: [facility] OR[facility];  Service: General;;   OMENTECTOMY  09/12/2024   Procedure: OMENTECTOMY;  Surgeon: Marinda Jayson KIDD, MD;  Location: [facility] ORS;  Service: General;;    Family History  Problem Relation Age of Onset   Breast cancer Neg Hx     Social History:  reports that she has never smoked. She has never been exposed to tobacco smoke. She has never used smokeless tobacco. She reports that she does not drink alcohol and does not use drugs.  Allergies:  Allergies  Allergen Reactions   Ace Inhibitors Anaphylaxis   Lisinopril Anaphylaxis   Atorvastatin Other (  See Comments)    Muscle aches    Medications reviewed.    ROS Full ROS performed and is otherwise negative other than what is stated in HPI   BP (!) 157/89   Pulse 91   Temp 98.6 F (37 C) (Oral)   Ht 4' 11 (1.499 m)   Wt 184 lb 6.4 oz (83.6 kg)   SpO2 96%   BMI 37.24 kg/m   Physical Exam Abdomen is soft, obese, nontender to palpation.  Intermittent line there is an area of opening at the upper part of her incision.  I probed this with a Q-tip and the cavity beneath the skin is healing in well.  The edges of the tunneling do have good granulation tissue.  There is  just serosanguineous output on her bandage.  The wound was then packed again with saline soaked gauze.    No results found for this or any previous visit (from the past 48 hours). No results found.  Assessment/Plan:  Patient status post ventral hernia repair with Vicryl mesh for an obstructed hernia.  She did have some abdominal wall cellulitis with an abscess that was drained.  She is doing well and the wound is healing up.  Continue packing the wound daily.  We will plan to see her again in 6 weeks.   Jayson Endow, M.D. [facility]

## 2025-01-07 ENCOUNTER — Ambulatory Visit: Admitting: General Surgery

## 2025-01-07 ENCOUNTER — Encounter: Payer: Self-pay | Admitting: General Surgery

## 2025-01-07 VITALS — BP 150/86 | HR 86 | Ht <= 58 in | Wt 179.0 lb

## 2025-01-07 DIAGNOSIS — K56609 Unspecified intestinal obstruction, unspecified as to partial versus complete obstruction: Secondary | ICD-10-CM

## 2025-01-07 DIAGNOSIS — Z09 Encounter for follow-up examination after completed treatment for conditions other than malignant neoplasm: Secondary | ICD-10-CM | POA: Diagnosis not present

## 2025-01-07 DIAGNOSIS — K430 Incisional hernia with obstruction, without gangrene: Secondary | ICD-10-CM

## 2025-01-07 NOTE — Patient Instructions (Addendum)
 Follow-up with our office as needed. No restrictions.   Please call and ask to speak with a nurse if you develop questions or concerns.   Diastasis Recti  Diastasis recti is a condition in which the muscles of the abdomen (rectus abdominis muscles) become thin and separate. The result is a wider space between the muscles of the right and left abdomen (abdominal muscles). This wider space between the muscles may cause a bulge in the middle of the abdomen. This bulge may be noticed when a person is straining or when he or she sits up after lying down. Diastasis recti can affect men and women. It is most common among pregnant women, babies, people with obesity, and people who have had abdominal surgery. Exercise or surgery may help correct this condition. What are the causes? Common causes of this condition include: Pregnancy. As the uterus grows in size, it puts pressure on the abdominal muscles, causing the muscles to separate. Obesity. Excess fat puts pressure on abdominal muscles. Weight lifting. Some exercises of the abdomen. Advanced age. Genetics. Having had surgery on the abdomen before. What increases the risk? This condition is more likely to develop in: Women. Newborns, especially newborns who are born early (prematurely). What are the signs or symptoms? Common symptoms of this condition include: A bulge in the middle of your abdomen. You will notice it most when you sit up or strain. Pain in your low back, hips, or the area between your hip bones (pelvis). Constipation. Being unable to control when you urinate (urinary incontinence). Bloating. Poor posture. How is this diagnosed? This condition is diagnosed with a physical exam. During the exam, your health care provider will ask you to lie flat on your back and do a crunch or half sit-up. If you have diastasis recti, a bulge will appear lengthwise between your abdominal muscles in the center of your abdomen. Your health care  provider will measure the gap between your muscles with one of the following: A medical device used to measure the space between two objects (caliper). A tape measure. CT scan. Ultrasound. Finger spaces. Your health care provider will measure the space using his or her fingers. How is this treated? If your muscle separation is not too large, you may not need treatment. However, if you are a woman who plans to become pregnant again, you should treat this condition before your next pregnancy. Treatment may include: Physical therapy exercises to strengthen and tighten your abdominal muscles. Lifestyle changes such as weight loss and exercise. Over-the-counter pain medicines as needed. Surgery to correct the separation. Follow these instructions at home: Activity Return to your normal activities as told by your health care provider. Ask your health care provider what activities are safe for you. Do exercises as told by your health care provider. Make sure you are doing your exercises and movements correctly when lifting weights or doing exercises using your abdominal muscles or the muscles in the center of your body that give stability (core muscles). Proper form can help to prevent this condition from happening again. General instructions If you are overweight, ask your health care provider for help with weight loss. Losing even a small amount of weight can help to improve your diastasis recti. Take over-the-counter or prescription medicines only as told by your health care provider. Do not strain. Straining can make the separation worse. Examples of straining include: Pushing hard to have a bowel movement, such as when you have constipation. Lifting heavy objects or lifting children.  Standing up and sitting down. You may need to take these actions to prevent or treat constipation: Drink enough fluid to keep your urine pale yellow. Take over-the-counter or prescription medicines. Eat foods  that are high in fiber, such as beans, whole grains, and fresh fruits and vegetables. Limit foods that are high in fat and processed sugars, such as fried or sweet foods. Keep all follow-up visits. This is important. Contact a health care provider if: You notice a new bulge in your abdomen. Get help right away if: You experience severe discomfort in your abdomen. You develop severe abdominal pain along with nausea, vomiting, or a fever. Summary Diastasis recti is a condition in which the muscles of the abdomen (rectus abdominismuscles) become thin and separate. You may notice a bulge in your abdomen because the space has widened between the muscles of the right and left abdomen. The most common symptom is a bulge in the middle of your abdomen. You will notice it most when you sit up or strain. This condition is diagnosed with a physical exam. If the muscle separation is not too big, you may not need treatment. Otherwise, you may need to do physical therapy or have surgery. This information is not intended to replace advice given to you by your health care provider. Make sure you discuss any questions you have with your health care provider. Document Revised: 08/20/2023 Document Reviewed: 08/20/2023 Elsevier Patient Education  2024 [employer].

## 2025-01-18 NOTE — Progress Notes (Signed)
 Outpatient Surgical Follow Up   Brianna [name] is an 64 y.o. female.   Chief Complaint  Patient presents with   Follow-up    HPI: Brianna [name] returns today status post open ventral hernia repair for incarcerated ventral hernia.  She did develop abdominal wall cellulitis with small abscess that was drained.  She has been doing remarkably well since her surgery.  She says that she does not have much pain at the area.  She is tolerating a diet and having normal bowel function.  She continues on dialysis.  Past Medical History:  Diagnosis Date   Acid reflux    Anasarca    Anemia    Asthma    well controlled   Bilateral leg edema    Bronchopneumonia    Chronic heart failure with preserved ejection fraction (HFpEF) (HCC)    a. 12/2020 Echo: EF 70-75%, no rwma, mild LVH, GrI DD, nl RV fxn, triv MR/AI.   CKD (chronic kidney disease), stage IV (HCC)    DM (diabetes mellitus), type 2 (HCC)    Dyspnea    Hypertension    Hypokalemia    Morbid obesity (HCC)    Strangulated ventral hernia     Past Surgical History:  Procedure Laterality Date   A/V FISTULAGRAM Left 01/28/2024   Procedure: A/V Fistulagram;  Surgeon: Jama Cordella MATSU, MD;  Location: [facility];  Service: Cardiovascular;  Laterality: Left;   A/V FISTULAGRAM Left 04/14/2024   Procedure: A/V Fistulagram;  Surgeon: Jama Cordella MATSU, MD;  Location: [facility];  Service: Cardiovascular;  Laterality: Left;   AV FISTULA PLACEMENT Left 08/08/2023   Procedure: ARTERIOVENOUS (AV) FISTULA CREATION (RADIALCEPHALIC);  Surgeon: Marea Selinda RAMAN, MD;  Location: [facility];  Service: Vascular;  Laterality: Left;  regional with MAC   BOWEL RESECTION  02/09/2020   Procedure: SMALL BOWEL RESECTION;  Surgeon: Desiderio Schanz, MD;  Location: [facility];  Service: General;;   DIALYSIS/PERMA CATHETER INSERTION N/A 05/28/2023   Procedure: DIALYSIS/PERMA CATHETER INSERTION;  Surgeon: Jama Cordella MATSU, MD;  Location: [facility];  Service: Cardiovascular;  Laterality: N/A;   LAPAROSCOPIC TRANSABDOMINAL HERNIA N/A 09/12/2024   Procedure: REPAIR, HERNIA, ABDOMINAL APPROACH;  Surgeon: Marinda Jayson KIDD, MD;  Location: [facility];  Service: General;  Laterality: N/A;   LAPAROTOMY N/A 02/09/2020   Procedure: EXPLORATORY LAPAROTOMY;  Surgeon: Desiderio Schanz, MD;  Location: [facility];  Service: General;  Laterality: N/A;   LYSIS OF ADHESION  09/12/2024   Procedure: LAPAROTOMY, FOR LYSIS OF ADHESIONS;  Surgeon: Marinda Jayson KIDD, MD;  Location: [facility];  Service: General;;   OMENTECTOMY  09/12/2024   Procedure: OMENTECTOMY;  Surgeon: Marinda Jayson KIDD, MD;  Location: [facility];  Service: General;;    Family History  Problem Relation Age of Onset   Breast cancer Neg Hx     Social History:  reports that she has never smoked. She has never been exposed to tobacco smoke. She has never used smokeless tobacco. She reports that she does not drink alcohol and does not use drugs.  Allergies: Allergies[1]  Medications reviewed.    ROS Full ROS performed and is otherwise negative other than what is stated in HPI   BP (!) 150/86   Pulse 86   Ht 4' 9 (1.448 m)   Wt 179 lb (81.2 kg)   BMI 38.74 kg/m   Physical Exam Abdomen is soft, nontender and nondistended.  Surgical scar has healed well.  There is no evidence  of cellulitis or erythema around the incision.  Due to her body habitus is difficult to fully examine whether there is a hernia recurrence but there is not a huge bulge at the area of surgery.    No results found for this or any previous visit (from the past 48 hours). No results found.  Assessment/Plan:  Patient status post ventral hernia repair for incarcerated ventral hernia.  She is doing well.  There is no erythema or infection around her surgical site.  At this time she has no restrictions from surgery and can follow-up with us  as needed.  A total of 20 minutes was spent reviewing the patient's chart,  performing history and physical and discussing treatment options with the patient   Jayson Endow, M.D. [facility]     [1]  Allergies Allergen Reactions   Ace Inhibitors Anaphylaxis   Lisinopril Anaphylaxis   Atorvastatin Other (See Comments)    Muscle aches
